# Patient Record
Sex: Female | Born: 1945 | Race: White | Hispanic: No | Smoking: Never smoker
Health system: Southern US, Community
[De-identification: ages and names within clinical notes are randomized; demographics above are authoritative.]

## PROBLEM LIST (undated history)

## (undated) DIAGNOSIS — T7840XA Allergy, unspecified, initial encounter: Secondary | ICD-10-CM

## (undated) DIAGNOSIS — K5792 Diverticulitis of intestine, part unspecified, without perforation or abscess without bleeding: Secondary | ICD-10-CM

## (undated) DIAGNOSIS — K760 Fatty (change of) liver, not elsewhere classified: Secondary | ICD-10-CM

## (undated) DIAGNOSIS — I639 Cerebral infarction, unspecified: Secondary | ICD-10-CM

## (undated) DIAGNOSIS — Q676 Pectus excavatum: Secondary | ICD-10-CM

## (undated) DIAGNOSIS — B029 Zoster without complications: Secondary | ICD-10-CM

## (undated) DIAGNOSIS — I2510 Atherosclerotic heart disease of native coronary artery without angina pectoris: Secondary | ICD-10-CM

## (undated) DIAGNOSIS — I10 Essential (primary) hypertension: Secondary | ICD-10-CM

## (undated) DIAGNOSIS — R911 Solitary pulmonary nodule: Secondary | ICD-10-CM

## (undated) DIAGNOSIS — K219 Gastro-esophageal reflux disease without esophagitis: Secondary | ICD-10-CM

## (undated) DIAGNOSIS — H269 Unspecified cataract: Secondary | ICD-10-CM

## (undated) DIAGNOSIS — K449 Diaphragmatic hernia without obstruction or gangrene: Secondary | ICD-10-CM

## (undated) DIAGNOSIS — I441 Atrioventricular block, second degree: Secondary | ICD-10-CM

## (undated) HISTORY — DX: Zoster without complications: B02.9

## (undated) HISTORY — DX: Allergy, unspecified, initial encounter: T78.40XA

## (undated) HISTORY — DX: Gastro-esophageal reflux disease without esophagitis: K21.9

## (undated) HISTORY — PX: BACK SURGERY: SHX140

## (undated) HISTORY — PX: LUMBAR LAMINECTOMY: SHX95

## (undated) HISTORY — DX: Essential (primary) hypertension: I10

## (undated) HISTORY — DX: Unspecified cataract: H26.9

## (undated) HISTORY — PX: LAPAROSCOPIC BILATERAL SALPINGO OOPHERECTOMY: SHX5890

## (undated) HISTORY — PX: ABDOMINAL HYSTERECTOMY: SHX81

## (undated) HISTORY — PX: CATARACT EXTRACTION, BILATERAL: SHX1313

---

## 1984-02-29 HISTORY — PX: ABDOMINAL HYSTERECTOMY: SHX81

## 1984-02-29 HISTORY — PX: OTHER SURGICAL HISTORY: SHX169

## 1999-12-06 ENCOUNTER — Other Ambulatory Visit: Admission: RE | Admit: 1999-12-06 | Discharge: 1999-12-06 | Payer: Self-pay | Admitting: Internal Medicine

## 1999-12-06 ENCOUNTER — Encounter: Payer: Self-pay | Admitting: Internal Medicine

## 1999-12-06 ENCOUNTER — Encounter: Admission: RE | Admit: 1999-12-06 | Discharge: 1999-12-06 | Payer: Self-pay | Admitting: Internal Medicine

## 1999-12-14 ENCOUNTER — Encounter: Admission: RE | Admit: 1999-12-14 | Discharge: 1999-12-14 | Payer: Self-pay | Admitting: Internal Medicine

## 1999-12-14 ENCOUNTER — Encounter: Payer: Self-pay | Admitting: Internal Medicine

## 2003-09-19 ENCOUNTER — Ambulatory Visit (HOSPITAL_COMMUNITY): Admission: RE | Admit: 2003-09-19 | Discharge: 2003-09-19 | Payer: Self-pay | Admitting: Gastroenterology

## 2003-09-19 ENCOUNTER — Encounter (INDEPENDENT_AMBULATORY_CARE_PROVIDER_SITE_OTHER): Payer: Self-pay | Admitting: Specialist

## 2008-04-07 ENCOUNTER — Encounter: Admission: RE | Admit: 2008-04-07 | Discharge: 2008-04-07 | Payer: Self-pay | Admitting: Family Medicine

## 2008-05-14 ENCOUNTER — Encounter: Admission: RE | Admit: 2008-05-14 | Discharge: 2008-05-14 | Payer: Self-pay | Admitting: Gastroenterology

## 2009-04-02 ENCOUNTER — Emergency Department (HOSPITAL_COMMUNITY): Admission: EM | Admit: 2009-04-02 | Discharge: 2009-04-02 | Payer: Self-pay | Admitting: Emergency Medicine

## 2011-03-18 NOTE — Op Note (Signed)
NAME:  Kayla Terry, Kayla Terry                        ACCOUNT NO.:  [id number]   MEDICAL RECORD NO.:  [id number]                   PATIENT TYPE:  AMB   LOCATION:  ENDO                                 FACILITY:  [facility]   PHYSICIAN:  Petra Kuba, M.D.                 DATE OF BIRTH:  [birth year]   DATE OF PROCEDURE:  09/19/2003  DATE OF DISCHARGE:                                 OPERATIVE REPORT   PROCEDURE:  Colonoscopy with biopsy.   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 Consent was signed  after the risks and benefits, methods and  options were thoroughly discussed in the office.   MEDICATIONS:  Demerol 75, Versed 6.   DESCRIPTION OF PROCEDURE:  A rectal inspection was pertinent for external  hemorrhoids. The digital examination was negative.   The video pediatric adjustable colonoscope was inserted and easily advanced  around the colon to the cecum. This did not require any abdominal  pressure  or any position changes.   We were able to advance a short way into the terminal ileum but could not  maintain position there unfortunately long enough  to get some biopsies. We  kept falling out back into the cecum. We did try rolling her on her back and  various abdominal  pressures to maintain position, but none was helpful, so  we elected to withdraw.   The prep was adequate. There was some liquid stool that required washing and  suctioning. The colon was normal on slow withdrawal back to the rectum.  Random colon biopsies were obtained. There was a rare early left-sided  diverticula but no other  abnormalities. Anorectal pullthrough and  retroflexion confirmed some small  hemorrhoids. The scope was reinserted a  short way up the left side of the colon. Air was suctioned and the scope was  removed.   The patient tolerated the procedure well. There were no obvious immediate  complications.   ENDOSCOPIC DIAGNOSES:  1. Internal and external  hemorrhoids.  2. Rare left-sided diverticula.  3. Otherwise within normal limits to the cecum, status post  random biopsy     surrounding and a quick look at the terminal ileum which was normal.   PLAN:  Await pathology to rule out microscopic colitis. Rescreen in 5 to 10  years. Will go ahead and have her Robinul, but if that continues to have  side-effects, may try Levsin or Nulev and follow up in 2 to 3 months or  p.r.n.                                               Petra Kuba, M.D.    MEM/MEDQ  D:  09/19/2003  T:  09/19/2003  Job:  [id number]  cc:   Eric L. August Saucer, M.D.  [address] [address]  [place]  [place] [address]  Fax: [phone]

## 2011-07-21 ENCOUNTER — Other Ambulatory Visit: Payer: Self-pay | Admitting: Family Medicine

## 2011-07-21 DIAGNOSIS — Z1231 Encounter for screening mammogram for malignant neoplasm of breast: Secondary | ICD-10-CM

## 2011-07-27 ENCOUNTER — Ambulatory Visit
Admission: RE | Admit: 2011-07-27 | Discharge: 2011-07-27 | Disposition: A | Discharge status: Home / Self Care | Payer: MEDICARE | Source: Ambulatory Visit | Attending: Family Medicine | Admitting: Family Medicine

## 2011-07-27 DIAGNOSIS — Z1231 Encounter for screening mammogram for malignant neoplasm of breast: Secondary | ICD-10-CM

## 2011-11-01 DIAGNOSIS — I340 Nonrheumatic mitral (valve) insufficiency: Secondary | ICD-10-CM

## 2011-11-01 HISTORY — DX: Nonrheumatic mitral (valve) insufficiency: I34.0

## 2011-12-24 ENCOUNTER — Ambulatory Visit (INDEPENDENT_AMBULATORY_CARE_PROVIDER_SITE_OTHER): Payer: Medicare Other | Admitting: Family Medicine

## 2011-12-24 VITALS — BP 167/99 | HR 89 | Temp 97.9°F | Resp 16 | Ht 64.25 in | Wt 159.0 lb

## 2011-12-24 DIAGNOSIS — I10 Essential (primary) hypertension: Secondary | ICD-10-CM

## 2011-12-24 DIAGNOSIS — J310 Chronic rhinitis: Secondary | ICD-10-CM

## 2011-12-24 DIAGNOSIS — J029 Acute pharyngitis, unspecified: Secondary | ICD-10-CM

## 2011-12-24 LAB — POCT RAPID STREP A (OFFICE): Rapid Strep A Screen: NEGATIVE

## 2011-12-24 MED ORDER — HYDROCOD POLST-CHLORPHEN POLST 10-8 MG/5ML PO LQCR: 5.0000 mL | Freq: Two times a day (BID) | ORAL | Status: AC | PRN

## 2011-12-24 NOTE — Progress Notes (Signed)
  [facility]:  Office Visit  Chief Complaint:  Chief Complaint  Patient presents with  . Sore Throat    x 1 week allegra, corcidin  not helping  . Headache    x 1 week    HPI: Kayla Terry is a 66 y.o. female who complains of  Sore throat, phlegm in mouth, HA, chills, + sinus pressure all over, Denies cough or ear pain.  Tried Excedrin, Allegra, Clorascetin without relief.  + Sick contacts. + grandkids   Past Medical History  Diagnosis Date  . Allergy    History reviewed. No pertinent past surgical history. History   Social History  . Marital Status: Married    Spouse Name: N/A    Number of Children: N/A  . Years of Education: N/A   Social History Main Topics  . Smoking status: Never Smoker   . Smokeless tobacco: None  . Alcohol Use: No  . Drug Use: No  . Sexually Active: None   Other Topics Concern  . None   Social History Narrative  . None   Family History  Problem Relation Age of Onset  . Cancer Mother   . Cancer Father   . Multiple sclerosis Sister   . Alcohol abuse Brother    Allergies  Allergen Reactions  . Aleve Anxiety   Prior to Admission medications   Not on File     ROS: The patient denies fevers, chills, night sweats, unintentional weight loss, chest pain, palpitations, wheezing, dyspnea on exertion, nausea, vomiting, abdominal pain, dysuria, hematuria, melena, numbness, weakness, or tingling. + sore throat  All other systems have been reviewed and were otherwise negative with the exception of those mentioned in the HPI and as above.    PHYSICAL EXAM: Filed Vitals:   12/24/11 1426  BP: 167/99  Pulse: 89  Temp: 97.9 F (36.6 C)  Resp: 16   Filed Vitals:   12/24/11 1426  Height: 5' 4.25" (1.632 m)  Weight: 159 lb (72.122 kg)   Body mass index is 27.08 kg/(m^2).  General: Alert, no acute distress HEENT:  Normocephalic, atraumatic, oropharynx patent. + boogy nares, red nares. No exudates, tonsils slightly  erythematous. TM normal. NO sinus tenderness.  Cardiovascular:  Regular rate and rhythm, no rubs murmurs or gallops.  No Carotid bruits, radial pulse intact. No pedal edema.  Respiratory: Clear to auscultation bilaterally.  No wheezes, rales, or rhonchi.  No cyanosis, no use of accessory musculature GI: No organomegaly, abdomen is soft and non-tender, positive bowel sounds.  No masses. Skin: No rashes. Neurologic: Facial musculature symmetric. Psychiatric: Patient is appropriate throughout our interaction. Lymphatic: No cervical lymphadenopathy Musculoskeletal: Gait intact.   LABS: Results for orders placed in visit on 12/24/11  POCT RAPID STREP A (OFFICE)      Component Value Range   Rapid Strep A Screen Negative  Negative       ASSESSMENT/PLAN: Encounter Diagnoses  Name Primary?  . Pharyngitis Yes  . Rhinitis     1. Will try a trial of flonase and Tussionex for sxs treatment. I think this is just a viral URI with PND rhinitis. If no improvement in 48-72 hours then consider abx. She has been on amoxacillin and Z-pack before which helps.  F/u prn 2. HTN- contributing to HA, taking decongestants? Advise to monitor and dc decongestants.    Hamilton Capri PHUONG, DO 12/24/2011 3:33 PM

## 2011-12-26 ENCOUNTER — Telehealth: Payer: Self-pay | Admitting: Family Medicine

## 2011-12-26 MED ORDER — AZITHROMYCIN 250 MG PO TABS: ORAL_TABLET | ORAL | Status: AC

## 2011-12-26 MED ORDER — BENZONATATE 200 MG PO CAPS: 200.0000 mg | ORAL_CAPSULE | Freq: Two times a day (BID) | ORAL | Status: AC | PRN

## 2011-12-26 NOTE — Telephone Encounter (Signed)
Spoke with patient, she is not feeling better. She was unable to fill her Tussionex prescription because it was too expensive. Sx are the same. Will rx Z-pack and Tessalon Perles . Asked patient to be mindful of blood pressure while on decongestant, stop decongestant since BP is too high.

## 2012-01-29 ENCOUNTER — Emergency Department (HOSPITAL_COMMUNITY)
Admission: EM | Admit: 2012-01-29 | Discharge: 2012-01-29 | Disposition: A | Discharge status: Home / Self Care | Payer: Medicare Other | Attending: Emergency Medicine | Admitting: Emergency Medicine

## 2012-01-29 ENCOUNTER — Other Ambulatory Visit: Payer: Self-pay

## 2012-01-29 ENCOUNTER — Encounter (HOSPITAL_COMMUNITY): Payer: Self-pay

## 2012-01-29 ENCOUNTER — Emergency Department (HOSPITAL_COMMUNITY): Payer: Medicare Other

## 2012-01-29 DIAGNOSIS — R1012 Left upper quadrant pain: Secondary | ICD-10-CM | POA: Insufficient documentation

## 2012-01-29 DIAGNOSIS — R071 Chest pain on breathing: Secondary | ICD-10-CM | POA: Insufficient documentation

## 2012-01-29 DIAGNOSIS — R0789 Other chest pain: Secondary | ICD-10-CM

## 2012-01-29 LAB — COMPREHENSIVE METABOLIC PANEL
AST: 21 U/L (ref 0–37)
BUN: 11 mg/dL (ref 6–23)
CO2: 26 mEq/L (ref 19–32)
Chloride: 105 mEq/L (ref 96–112)
Creatinine, Ser: 0.73 mg/dL (ref 0.50–1.10)
GFR calc non Af Amer: 88 mL/min — ABNORMAL LOW (ref >90)
Total Bilirubin: 0.3 mg/dL (ref 0.3–1.2)

## 2012-01-29 LAB — DIFFERENTIAL
Basophils Absolute: 0 10*3/uL (ref 0.0–0.1)
Lymphocytes Relative: 26 % (ref 12–46)
Monocytes Absolute: 0.5 10*3/uL (ref 0.1–1.0)
Monocytes Relative: 13 % — ABNORMAL HIGH (ref 3–12)
Neutro Abs: 2.4 10*3/uL (ref 1.7–7.7)

## 2012-01-29 LAB — URINALYSIS, ROUTINE W REFLEX MICROSCOPIC
Bilirubin Urine: NEGATIVE
Glucose, UA: NEGATIVE mg/dL
Ketones, ur: NEGATIVE mg/dL
Protein, ur: NEGATIVE mg/dL

## 2012-01-29 LAB — URINE MICROSCOPIC-ADD ON

## 2012-01-29 LAB — CBC
HCT: 37.4 % (ref 36.0–46.0)
Hemoglobin: 12.8 g/dL (ref 12.0–15.0)
RBC: 4.38 MIL/uL (ref 3.87–5.11)
WBC: 4.2 10*3/uL (ref 4.0–10.5)

## 2012-01-29 MED ORDER — HYDROMORPHONE HCL PF 1 MG/ML IJ SOLN
1.0000 mg | Freq: Once | INTRAMUSCULAR | Status: AC
  Administered 2012-01-29: 1 mg via INTRAVENOUS
  Filled 2012-01-29: qty 1

## 2012-01-29 MED ORDER — OMEPRAZOLE 20 MG PO CPDR: 20.0000 mg | DELAYED_RELEASE_CAPSULE | Freq: Every day | ORAL | Status: DC

## 2012-01-29 MED ORDER — ONDANSETRON HCL 4 MG/2ML IJ SOLN
4.0000 mg | Freq: Once | INTRAMUSCULAR | Status: AC
  Administered 2012-01-29: 4 mg via INTRAVENOUS
  Filled 2012-01-29: qty 2

## 2012-01-29 MED ORDER — HYDROCODONE-ACETAMINOPHEN 5-500 MG PO TABS: 1.0000 | ORAL_TABLET | Freq: Four times a day (QID) | ORAL | Status: DC | PRN

## 2012-01-29 MED ORDER — HYDROCODONE-ACETAMINOPHEN 5-500 MG PO TABS: 1.0000 | ORAL_TABLET | Freq: Four times a day (QID) | ORAL | Status: AC | PRN

## 2012-01-29 MED ORDER — IBUPROFEN 800 MG PO TABS: 800.0000 mg | ORAL_TABLET | Freq: Three times a day (TID) | ORAL | Status: DC

## 2012-01-29 MED ORDER — ASPIRIN 81 MG PO CHEW
324.0000 mg | CHEWABLE_TABLET | Freq: Once | ORAL | Status: AC
  Administered 2012-01-29: 324 mg via ORAL
  Filled 2012-01-29: qty 4

## 2012-01-29 MED ORDER — SODIUM CHLORIDE 0.9 % IV BOLUS (SEPSIS)
500.0000 mL | Freq: Once | INTRAVENOUS | Status: AC
  Administered 2012-01-29: 500 mL via INTRAVENOUS

## 2012-01-29 MED ORDER — GI COCKTAIL ~~LOC~~
30.0000 mL | Freq: Once | ORAL | Status: AC
  Administered 2012-01-29: 30 mL via ORAL
  Filled 2012-01-29: qty 30

## 2012-01-29 MED ORDER — IBUPROFEN 800 MG PO TABS: 800.0000 mg | ORAL_TABLET | Freq: Three times a day (TID) | ORAL | Status: AC

## 2012-01-29 NOTE — ED Provider Notes (Signed)
9:45 AM  Date: 01/29/2012  Rate: 78  Rhythm: normal sinus rhythm  QRS Axis: normal  Intervals: normal  ST/T Wave abnormalities: normal  Conduction Disutrbances:none  Narrative Interpretation: Normal EKG.  Old EKG Reviewed: none available    Carleene Cooper III, MD 01/29/12 [phone]

## 2012-01-29 NOTE — ED Notes (Signed)
Lt. Upper quadrant abdominal pain began yesterday, she describes the pain as sharp.   Pain increases when she lays on her rt. Side or walking.  Pt. Denies any n/v/d, sob on exertions weakness and urinary pressure and feels like she has to urinate but cannot.   Denies any chest pain

## 2012-01-29 NOTE — Discharge Instructions (Signed)
Take ibuprofen for pain and inflammation with hydrocodone-acetaminophen for breakthrough pain but do not drive or operate machinery with hydrocodone-acetaminophen use. You may need an over-the-counter stool softener with hydrocodone-acetaminophen use. Also start Prilosec, as directed, for the next week. Followup with your primary care physician in the next 3-5 days for recheck of ongoing symptoms however return to emergency department for any changing or worsening symptoms as discussed.  Chest Wall Pain Chest wall pain is pain in or around the bones and muscles of your chest. It may take up to 6 weeks to get better. It may take longer if you must stay physically active in your work and activities.  CAUSES  Chest wall pain may happen on its own. However, it may be caused by:  A viral illness like the flu.   Injury.   Coughing.   Exercise.   Arthritis.   Fibromyalgia.   Shingles.  HOME CARE INSTRUCTIONS   Avoid overtiring physical activity. Try not to strain or perform activities that cause pain. This includes any activities using your chest or your abdominal and side muscles, especially if heavy weights are used.   Put ice on the sore area.   Put ice in a plastic bag.   Place a towel between your skin and the bag.   Leave the ice on for 15 to 20 minutes per hour while awake for the first 2 days.   Only take over-the-counter or prescription medicines for pain, discomfort, or fever as directed by your caregiver.  SEEK IMMEDIATE MEDICAL CARE IF:   Your pain increases, or you are very uncomfortable.   You have a fever.   Your chest pain becomes worse.   You have new, unexplained symptoms.   You have nausea or vomiting.   You feel sweaty or lightheaded.   You have a cough with phlegm (sputum), or you cough up blood.  MAKE SURE YOU:   Understand these instructions.   Will watch your condition.   Will get help right away if you are not doing well or get worse.  Document  Released: 10/17/2005 Document Revised: 10/06/2011 Document Reviewed: 06/13/2011 [facility] Patient Information 2012 [place], [place].

## 2012-01-29 NOTE — ED Provider Notes (Signed)
History     CSN: [id number]  Arrival date & time 01/29/12  4098   First MD Initiated Contact with Patient 01/29/12 0813      Chief Complaint  Patient presents with  . Abdominal Pain    (Consider location/radiation/quality/duration/timing/severity/associated sxs/prior treatment) HPI Patient presents to emergency department complaining of left upper quadrant/left lower anterior chest pain. Patient states that yesterday afternoon she began to have a mild "constant pinching sensation" with intermittent mild stabbing pain that she states was very fleeting in her left upper quadrant/left lower anterior chest "under my ribs." However patient states that by approximately 9 PM the pain has become much more severe and constant described as a constant stabbing ache. Patient states pain is aggravated by lying on her right side stating "if I try to lie down on my right side it feels like there is a knife stabbing into my left side." Patient also states that if she gets up to move around she feels like the constant pain become worse however states "I can't move any certain direction or push on anything to make the pain, it just feels like if I begin to move, it hurts more." Patient states that when the pain becomes more severe it "makes me catch my breath." She denies any chest pain or shortness of breath. She denies nausea, vomiting, diarrhea. Patient denies any recent fevers, chills, or illness. Patient has no known medical problems and takes no medicines on regular basis. She denies tobacco, alcohol, or illicit drug use. She denies family history of early heart disease or heart attack. Patient took two Tylenol PM's last night. She takes no medicines on a daily basis. She took nothing for pain this morning prior to arrival. Patient is followed by [facility] family practice. Patient denies any recent long travel, lower extremity pain or swelling, history of bleeding or clotting disorder, cardiac or pulmonary  disease, history of PE or DVT, cough or hemoptysis. She denies any exogenous estrogen use. Patient denies any history of similar pain. She denies dysuria, hematuria, abdominal pain, or blood in her stool. Patient states she has history of a partial hysterectomy with a left oophorectomy. She denies any additional abdominal surgeries. She denies alleviating factors.   Past Medical History  Diagnosis Date  . Allergy     History reviewed. No pertinent past surgical history.  Family History  Problem Relation Age of Onset  . Cancer Mother   . Cancer Father   . Multiple sclerosis Sister   . Alcohol abuse Brother     History  Substance Use Topics  . Smoking status: Never Smoker   . Smokeless tobacco: Not on file  . Alcohol Use: No    OB History    Grav Para Term Preterm Abortions TAB SAB Ect Mult Living                  Review of Systems  All other systems reviewed and are negative.    Allergies  Aleve  Home Medications   Current Outpatient Rx  Name Route Sig Dispense Refill  . OMEPRAZOLE-SODIUM BICARBONATE 20-1100 MG PO CAPS Oral Take 1 capsule by mouth daily as needed. For heartburn      BP 161/102  Temp(Src) 97.7 F (36.5 C) (Oral)  SpO2 96%  Physical Exam  Nursing note and vitals reviewed. Constitutional: She is oriented to person, place, and time. She appears well-developed and well-nourished. No distress.  HENT:  Head: Normocephalic and atraumatic.  Eyes: Conjunctivae are normal.  Neck: Normal range of motion. Neck supple.  Cardiovascular: Normal rate, regular rhythm, normal heart sounds and intact distal pulses.  Exam reveals no gallop and no friction rub.   No murmur heard. Pulmonary/Chest: Effort normal and breath sounds normal. No respiratory distress. She has no wheezes. She has no rales. She exhibits no tenderness.  Abdominal: Soft. Bowel sounds are normal. She exhibits no distension and no mass. There is no tenderness. There is no rebound and no  guarding.       No CVA TTP.   Musculoskeletal: Normal range of motion. She exhibits no edema and no tenderness.  Neurological: She is alert and oriented to person, place, and time.  Skin: Skin is warm and dry. No rash noted. She is not diaphoretic. No erythema.  Psychiatric: She has a normal mood and affect.    ED Course  Procedures (including critical care time)  PO ASA and GI cocktail IV fluids, dilaudid and zofran  Labs Reviewed  DIFFERENTIAL - Abnormal; Notable for the following:    Monocytes Relative 13 (*)    All other components within normal limits  COMPREHENSIVE METABOLIC PANEL - Abnormal; Notable for the following:    GFR calc non Af Amer 88 (*)    All other components within normal limits  URINALYSIS, ROUTINE W REFLEX MICROSCOPIC - Abnormal; Notable for the following:    Leukocytes, UA TRACE (*)    All other components within normal limits  CBC  LIPASE, BLOOD  D-DIMER, QUANTITATIVE  POCT I-STAT TROPONIN I  URINE MICROSCOPIC-ADD ON   Dg Chest 2 View  01/29/2012  *RADIOLOGY REPORT*  Clinical Data: Left upper quadrant abdominal pain since early this morning.  CHEST - 2 VIEW  Comparison: Chest x-ray 02/16/2008.  Findings: The lung volumes are normal.  No consolidative airspace disease.  There is some mild bronchial wall thickening, most pronounced in the region of the right middle and right lower lobe. There are no pleural effusions.  Pulmonary vasculature is normal. Heart size appears mildly enlarged, although this is likely accentuated by the patient's pectus excavatum deformity. Mediastinal contours are unremarkable.  IMPRESSION: 1.  Focal bronchial wall thickening involving portions of the right middle and lower lobe.  This could represent a manifestation of bronchitis, sequelae of mild aspiration, or early developing bronchopneumonia.  Clinical correlation is recommended.  Original Report Authenticated By: Florencia Reasons, M.D.     1. Chest wall pain       MDM    Patient is completely pain-free at this time. Patient's evaluation and treatment plan discussed with Dr. Ignacia Palma. At this time we have no origin for patient's pain that is resolved. She is low risk for both acute coronary syndrome and pulmonary embolism with a negative d-dimer and negative troponin. No acute findings on EKG. Abdomen is soft and nontender and nonacute. Vital signs stable and patient is afebrile. We spoke at length with patient about worrisome signs or symptoms that should prompt return to emergency department otherwise conservative management at home with anti-inflammatories and pain medicine and PPI with close followup with her primary care physician. Patient voices her understanding and is agreeable to this plan.        Jenness Corner, [place] 01/29/12 1118

## 2012-01-30 NOTE — ED Provider Notes (Signed)
Medical screening examination/treatment/procedure(s) were performed by non-physician practitioner and as supervising physician I was immediately available for consultation/collaboration.   Carleene Cooper III, MD 01/30/12 1010

## 2012-05-16 ENCOUNTER — Ambulatory Visit (INDEPENDENT_AMBULATORY_CARE_PROVIDER_SITE_OTHER): Payer: Medicare Other | Admitting: Emergency Medicine

## 2012-05-16 VITALS — BP 120/80 | HR 88 | Temp 98.0°F | Resp 16 | Ht 63.0 in | Wt 150.4 lb

## 2012-05-16 DIAGNOSIS — M5412 Radiculopathy, cervical region: Secondary | ICD-10-CM

## 2012-05-16 MED ORDER — HYDROCODONE-ACETAMINOPHEN 5-500 MG PO TABS: 1.0000 | ORAL_TABLET | ORAL | Status: AC | PRN

## 2012-05-16 MED ORDER — CELECOXIB 200 MG PO CAPS: 200.0000 mg | ORAL_CAPSULE | Freq: Two times a day (BID) | ORAL | Status: AC

## 2012-05-16 NOTE — Progress Notes (Signed)
   Date:  05/16/2012   Name:  Kayla Terry   DOB:  30-Mar-1946   MRN:  [id number]  PCP:  No primary provider on file.    Chief Complaint: Shoulder Pain   History of Present Illness:  Kayla Terry is a 66 y.o. very pleasant female patient who presents with the following:  2 week duration of pain in both upper arms when she lays down to sleep.  No numbness or weakness.  Pain symetrical.  No neuro symptoms, overuse, or history of injury.  Denies other complaints.  There is no problem list on file for this patient.   Past Medical History  Diagnosis Date  . Allergy     No past surgical history on file.  History  Substance Use Topics  . Smoking status: Never Smoker   . Smokeless tobacco: Not on file  . Alcohol Use: No    Family History  Problem Relation Age of Onset  . Cancer Mother   . Cancer Father   . Multiple sclerosis Sister   . Alcohol abuse Brother     Allergies  Allergen Reactions  . Naproxen Sodium Anxiety    Medication list has been reviewed and updated.  Current Outpatient Prescriptions on File Prior to Visit  Medication Sig Dispense Refill  . omeprazole (PRILOSEC) 20 MG capsule Take 1 capsule (20 mg total) by mouth daily.  14 capsule  0  . Omeprazole-Sodium Bicarbonate (ZEGERID) 20-1100 MG CAPS Take 1 capsule by mouth daily as needed. For heartburn        Review of Systems:  As per HPI, otherwise negative.    Physical Examination: Filed Vitals:   05/16/12 1524  BP: 120/80  Pulse: 88  Temp: 98 F (36.7 C)  Resp: 16   Filed Vitals:   05/16/12 1524  Height: 5\' 3"  (1.6 m)  Weight: 150 lb 6.4 oz (68.221 kg)   Body mass index is 26.64 kg/(m^2). Ideal Body Weight: Weight in (lb) to have BMI = 25: 140.8    GEN: WDWN, NAD, Non-toxic, Alert & Oriented x 3 HEENT: Atraumatic, Normocephalic.  Ears and Nose: No external deformity. EXTR: No clubbing/cyanosis/edema.  No tenderness, weakness, neuro normal  tinnel and phalen negative. NEURO:  Normal gait.  PSYCH: Normally interactive. Conversant. Not depressed or anxious appearing.  Calm demeanor.    Assessment and Plan: Cervical neuritis. NSAID vicodin NCS/EMG Follow up after  Carmelina Dane, MD

## 2012-08-21 ENCOUNTER — Ambulatory Visit (INDEPENDENT_AMBULATORY_CARE_PROVIDER_SITE_OTHER): Payer: Medicare Other | Admitting: Cardiovascular Disease

## 2012-08-21 ENCOUNTER — Encounter: Payer: Self-pay | Admitting: Cardiovascular Disease

## 2012-08-21 VITALS — BP 118/86 | HR 71 | Ht 63.0 in | Wt 151.4 lb

## 2012-08-21 DIAGNOSIS — R0602 Shortness of breath: Secondary | ICD-10-CM

## 2012-08-21 DIAGNOSIS — R0789 Other chest pain: Secondary | ICD-10-CM | POA: Insufficient documentation

## 2012-08-21 NOTE — Patient Instructions (Addendum)
Your physician has requested that you have an echocardiogram. Echocardiography is a painless test that uses sound waves to create images of your heart. It provides your doctor with information about the size and shape of your heart and how well your heart's chambers and valves are working. This procedure takes approximately one hour. There are no restrictions for this procedure.  Your physician recommends that you schedule a follow-up appointment in: 3 MONTHS 

## 2012-08-21 NOTE — Progress Notes (Signed)
    Kayla Terry Date of Birth  June 30, 1946       [facility]    [place] [address]  [address] [place], [place]  [address]   [place], [place]  [address] [phone]     [phone]   Fax  [phone]    Fax [phone]  Problem List: Chest tightness  History of Present Illness:  Kayla Terry is a 66 yo who presents for further evaluation of chest tightness and shoulder pains.  The pain is described as an ache.  These pains can last as long as several days.  She was found to have shingles at one point.  These pains have now progressed and now involve both shoulders.   The pain progressed and would occur every day and would hurt whenever she would lie down.  The pains get slightly better if she sits up.  There is no pleuretic component.    Kayla Terry is very active - she walks regularly.  She does not have any chest pain but does have some dyspnea with exertion.  Since she has been having the chest pains, she has been walking with a walking stick for balance.  She denies any syncope.  I saw Kayla Terry in the past for a similar chest pain - we performed a stress test that was negative.    Current Outpatient Prescriptions on File Prior to Visit  Medication Sig Dispense Refill  . Omeprazole-Sodium Bicarbonate (ZEGERID) 20-1100 MG CAPS Take 1 capsule by mouth daily as needed. For heartburn        Allergies  Allergen Reactions  . Naproxen Sodium Anxiety    Past Medical History  Diagnosis Date  . Allergy     No past surgical history on file.  History  Smoking status  . Never Smoker   Smokeless tobacco  . Not on file    History  Alcohol Use No    Family History  Problem Relation Age of Onset  . Cancer Mother   . Cancer Father   . Multiple sclerosis Sister   . Alcohol abuse Brother     Reviw of Systems:  Reviewed in the HPI.  All other systems are negative.  Physical Exam: Blood pressure 118/86, pulse 71, height 5\' 3"  (1.6  m), weight 151 lb 6.4 oz (68.675 kg). General: Well developed, well nourished, in no acute distress.  Head: Normocephalic, atraumatic, sclera non-icteric, mucus membranes are moist,   Neck: Supple. Carotids are 2 + without bruits. No JVD  Lungs: Clear bilaterally to auscultation.  Heart: regular rate.  normal  S1 S2. No murmurs, gallops or rubs.  Abdomen: Soft, non-tender, non-distended with normal bowel sounds. No hepatomegaly. No rebound/guarding. No masses.  Msk:  Strength and tone are normal  Extremities: No clubbing or cyanosis. No edema.  Distal pedal pulses are 2+ and equal bilaterally.  Neuro: Alert and oriented X 3. Moves all extremities spontaneously.  Psych:  Responds to questions appropriately with a normal affect.  ECG: Oct. 22, 2013 - NSR at 30, Early repol changes  Assessment / Plan:

## 2012-08-21 NOTE — Assessment & Plan Note (Signed)
Kayla Terry is a 66 yo who presents with episodes of chest pain. Her episodes are fairly atypical. They last for many days at a time.  He does have some shortness of breath with exertion. She apparently has seen me in the past for stress test although we could not find any records of that.  We'll schedule her for an echocardiogram for further evaluation of her chest discomfort and for her shortness of breath with exertion. Who  I have encouraged her to start a regular walking program. I'll see her back in 3 months for followup visit. After that she will followup with her general medical Dr.

## 2012-08-27 ENCOUNTER — Other Ambulatory Visit: Payer: Self-pay | Admitting: Family Medicine

## 2012-08-27 DIAGNOSIS — Z1231 Encounter for screening mammogram for malignant neoplasm of breast: Secondary | ICD-10-CM

## 2012-09-04 ENCOUNTER — Other Ambulatory Visit (HOSPITAL_COMMUNITY): Payer: Medicare Other

## 2012-09-04 ENCOUNTER — Telehealth: Payer: Self-pay | Admitting: *Deleted

## 2012-09-04 NOTE — Telephone Encounter (Signed)
Pt did not have echo done, reminded her that test order still available to be rescheduled, she states she will if Dr Elease Hashimoto still feels it is necessary.  Pt continues to have CP- mid chest, SOB, soreness/ pressure is more constant now, describes occasional pains that shoot up neck bilaterally only lasting seconds, states she has both arms ache from shoulder to elbow that hydrocodone will help ease, states that exercise does not make pain worse, 2009 stress test was normal per pt, Denies nausea and no diaphoresis. Pt has app tomorrow with PCP to discuss going to Neurologist per your suggestion.  Any further advise?

## 2012-09-05 ENCOUNTER — Telehealth: Payer: Self-pay | Admitting: Cardiovascular Disease

## 2012-09-05 NOTE — Telephone Encounter (Signed)
According to pt her PCP's office called today to cancelled her appointment indefinitely due to PCP  medical emergency. Pt's PCP wanted to see pt prior referring her to a neurologist. Pt states on  her last office visit with Dr. Elease Hashimoto, on 08/21/12 MD recommended for pt call her PCP so he can  referred her to a Neurologist, because pt's  symptoms were not heart related. Patient states that  The PCP's office said that   if Dr. Elease Hashimoto documents that pt needs to be ed to a neurologist . The PCP's office will use that information and will referrer pt.

## 2012-09-05 NOTE — Telephone Encounter (Signed)
If Dr. Elease Hashimoto document that pt needs to be referred to a neurologist. The PCP's office will use Dr. Elease Hashimoto documentation and will referrer pt to a Neurologist. ( Please see note bellow)

## 2012-09-05 NOTE — Telephone Encounter (Signed)
Pt's pcp office called her last night to cancel appt indefinitely  due to a medical emergency, dr Elease Hashimoto suggested a neurologist and to call her pcp to get a referral,  pcp needed to see before doing a referral, could dr Elease Hashimoto refer her since she doen't know when she will see pcp again,  would like dr love if still in practice, pt [phone]

## 2012-09-06 NOTE — Telephone Encounter (Signed)
Spoke with Dr Elease Hashimoto, he prefers that the pt see a PA/ NP from her PCP office, he was speaking out loud when it came to her discussing her hx with shingles/ not active currently. MSG left for pt and told her to call with further questions/ number provided.

## 2012-09-07 ENCOUNTER — Telehealth: Payer: Self-pay | Admitting: *Deleted

## 2012-09-07 NOTE — Telephone Encounter (Signed)
I called pt, she will call back today and schedule echo and will call PCP to set app with Np/pa. Pt agreed to plan.

## 2012-09-07 NOTE — Telephone Encounter (Signed)
Message     I would recommend that she have the echo since she has continued to have the chest pain.         ----- Message -----   From: Antony Odea, RN   Sent: 09/04/2012 9:30 AM   To: Vesta Mixer, MD, Antony Odea, RN

## 2012-09-12 ENCOUNTER — Ambulatory Visit (HOSPITAL_COMMUNITY): Payer: Medicare Other | Attending: Cardiovascular Disease | Admitting: Radiology

## 2012-09-12 DIAGNOSIS — R072 Precordial pain: Secondary | ICD-10-CM

## 2012-09-12 DIAGNOSIS — R0789 Other chest pain: Secondary | ICD-10-CM

## 2012-09-12 DIAGNOSIS — R0609 Other forms of dyspnea: Secondary | ICD-10-CM | POA: Insufficient documentation

## 2012-09-12 DIAGNOSIS — R0989 Other specified symptoms and signs involving the circulatory and respiratory systems: Secondary | ICD-10-CM | POA: Insufficient documentation

## 2012-09-12 DIAGNOSIS — R0602 Shortness of breath: Secondary | ICD-10-CM

## 2012-09-12 DIAGNOSIS — I059 Rheumatic mitral valve disease, unspecified: Secondary | ICD-10-CM | POA: Insufficient documentation

## 2012-09-12 NOTE — Progress Notes (Signed)
Echocardiogram performed.  

## 2012-10-05 ENCOUNTER — Ambulatory Visit
Admission: RE | Admit: 2012-10-05 | Discharge: 2012-10-05 | Disposition: A | Discharge status: Home / Self Care | Payer: Medicare Other | Source: Ambulatory Visit | Attending: Family Medicine | Admitting: Family Medicine

## 2012-10-05 DIAGNOSIS — Z1231 Encounter for screening mammogram for malignant neoplasm of breast: Secondary | ICD-10-CM

## 2012-10-26 ENCOUNTER — Encounter: Payer: Self-pay | Admitting: Cardiovascular Disease

## 2012-11-01 ENCOUNTER — Other Ambulatory Visit: Payer: Self-pay | Admitting: Gastroenterology

## 2012-11-01 DIAGNOSIS — R109 Unspecified abdominal pain: Secondary | ICD-10-CM

## 2012-11-01 DIAGNOSIS — R110 Nausea: Secondary | ICD-10-CM

## 2012-11-01 DIAGNOSIS — R079 Chest pain, unspecified: Secondary | ICD-10-CM

## 2012-11-06 ENCOUNTER — Ambulatory Visit
Admission: RE | Admit: 2012-11-06 | Discharge: 2012-11-06 | Disposition: A | Discharge status: Home / Self Care | Payer: Medicare Other | Source: Ambulatory Visit | Attending: Gastroenterology | Admitting: Gastroenterology

## 2012-11-06 DIAGNOSIS — R079 Chest pain, unspecified: Secondary | ICD-10-CM

## 2012-11-06 DIAGNOSIS — R110 Nausea: Secondary | ICD-10-CM

## 2012-11-06 DIAGNOSIS — R109 Unspecified abdominal pain: Secondary | ICD-10-CM

## 2012-11-12 ENCOUNTER — Ambulatory Visit: Payer: Medicare Other | Admitting: Cardiovascular Disease

## 2013-10-07 ENCOUNTER — Ambulatory Visit (INDEPENDENT_AMBULATORY_CARE_PROVIDER_SITE_OTHER): Payer: Medicare Other | Admitting: Family Medicine

## 2013-10-07 VITALS — BP 118/78 | HR 76 | Temp 97.4°F | Resp 16 | Ht 63.5 in | Wt 146.0 lb

## 2013-10-07 DIAGNOSIS — R110 Nausea: Secondary | ICD-10-CM

## 2013-10-07 DIAGNOSIS — R079 Chest pain, unspecified: Secondary | ICD-10-CM

## 2013-10-07 DIAGNOSIS — K219 Gastro-esophageal reflux disease without esophagitis: Secondary | ICD-10-CM

## 2013-10-07 DIAGNOSIS — J029 Acute pharyngitis, unspecified: Secondary | ICD-10-CM

## 2013-10-07 LAB — POCT CBC
Granulocyte percent: 78.9 %G (ref 37–80)
HCT, POC: 45.5 % (ref 37.7–47.9)
Lymph, poc: 1.4 (ref 0.6–3.4)
MCH, POC: 28.9 pg (ref 27–31.2)
MCV: 95.6 fL (ref 80–97)
MID (cbc): 0.5 (ref 0–0.9)
POC LYMPH PERCENT: 15.4 %L (ref 10–50)
Platelet Count, POC: 294 10*3/uL (ref 142–424)
RDW, POC: 14.1 %
WBC: 9.2 10*3/uL (ref 4.6–10.2)

## 2013-10-07 LAB — COMPREHENSIVE METABOLIC PANEL
Albumin: 4.4 g/dL (ref 3.5–5.2)
BUN: 11 mg/dL (ref 6–23)
CO2: 31 mEq/L (ref 19–32)
Glucose, Bld: 101 mg/dL — ABNORMAL HIGH (ref 70–99)
Sodium: 139 mEq/L (ref 135–145)
Total Bilirubin: 0.5 mg/dL (ref 0.3–1.2)
Total Protein: 7.2 g/dL (ref 6.0–8.3)

## 2013-10-07 MED ORDER — GI COCKTAIL ~~LOC~~: 30.0000 mL | Freq: Once | ORAL | Status: DC

## 2013-10-07 MED ORDER — SUCRALFATE 1 G PO TABS: 1.0000 g | ORAL_TABLET | Freq: Three times a day (TID) | ORAL | Status: DC

## 2013-10-07 MED ORDER — ONDANSETRON HCL 8 MG PO TABS: ORAL_TABLET | ORAL | Status: DC

## 2013-10-07 NOTE — Patient Instructions (Signed)
Use the zofran as needed for nausea. Try taking a carafate tablet before meals and before bed for about one week.  This may be helpful with nausea and reflux into your throat Work on a bland diet that is easy to digest and low in acid (avoid coffee, carbonated drinks, tomatoes, peppermint).   Call Dr. Alveta Heimlich office and explain your situation. Ask if they have any more dexilant samples that you could have  If you have any change in your symptoms, are getting worse or have any other concerns please let us know

## 2013-10-07 NOTE — Progress Notes (Signed)
[facility] [address], [place] [address] [phone]  Date:  10/07/2013   Name:  Kayla Terry   DOB:  Aug 28, 1946   MRN:  [id number]  PCP:  Pamelia Hoit, MD    Chief Complaint: Sore Throat, Headache and Nausea   History of Present Illness:  Kayla Terry is a 67 y.o. very pleasant female patient who presents with the following:  Here today because "I just don't feel good."  She notes that she has felt nauseated for about 2.5 weeks. Last week her throat became "scratchy".  She felt discomfort in the roof of her mouth.  She tried taking some allegra D which did not help.   Since yesterday she notes that her throat is more sore and she feels a "spasm, like a little grabbing feeling" in her throat.  She notes that her nose has been sometimes runny or congested. She has not checked her temp but "I think maybe I have" had a fever.  She sometimes feels sweaty or cold at night.    No vomiting.  No diarrhea She is able to swallow food and liquids without any problem.   Nothing is "getting stuck" on the way down  She does not have much appetite but does not notice any abdominal pain after eating.    Most recently she was taking dexilant for some sort of GI problem.  Her GI Dr. Guadlupe Spanish has been treating her for ?GERD.  She saw Dr. Elease Hashimoto last year for this issue to make sure not a cardiac issue.  She had a normal echo.  She has tried several other medications but dexilant has been the only thing that really helped her She has been off of her dexilant for about a week.  She was supposed to see Dr. Guadlupe Spanish but had to cancel because her mother passed away.  He has been providing her with samples because this medication is expensive. She did buy some prilosec OTC which she is taking.    She does have her gallbladder.  She has noted problems with chest pains for about 18 months.    No SOB.   Besides these issues she is generally healthy Wt Readings from Last  3 Encounters:  10/07/13 146 lb (66.225 kg)  08/21/12 151 lb 6.4 oz (68.675 kg)  05/16/12 150 lb 6.4 oz (68.221 kg)    Patient Active Problem List   Diagnosis Date Noted  . Chest tightness 08/21/2012    Past Medical History  Diagnosis Date  . Allergy     Past Surgical History  Procedure Laterality Date  . Abdominal hysterectomy      History  Substance Use Topics  . Smoking status: Never Smoker   . Smokeless tobacco: Not on file  . Alcohol Use: No    Family History  Problem Relation Age of Onset  . Cancer Mother   . Cancer Father   . Multiple sclerosis Sister   . Alcohol abuse Brother     Allergies  Allergen Reactions  . Naproxen Sodium Anxiety    Medication list has been reviewed and updated.  Current Outpatient Prescriptions on File Prior to Visit  Medication Sig Dispense Refill  . HYDROcodone-acetaminophen (NORCO/VICODIN) 5-325 MG per tablet Take 1 tablet by mouth every 6 (six) hours as needed.      Maxwell Caul Bicarbonate (ZEGERID) 20-1100 MG CAPS Take 1 capsule by mouth daily as needed. For heartburn       No current  facility-administered medications on file prior to visit.    Review of Systems:  As per HPI- otherwise negative.   Physical Examination: Filed Vitals:   10/07/13 0821  BP: 118/78  Pulse: 103  Temp: 97.4 F (36.3 C)  Resp: 16   Filed Vitals:   10/07/13 0821  Height: 5' 3.5" (1.613 m)  Weight: 146 lb (66.225 kg)   Body mass index is 25.45 kg/(m^2). Ideal Body Weight: Weight in (lb) to have BMI = 25: 143.1  GEN: WDWN, NAD, Non-toxic, A & O x 3, looks well HEENT: Atraumatic, Normocephalic. Neck supple. No masses, No LAD.  Bilateral TM wnl, oropharynx normal.  PEERL,EOMI.   Ears and Nose: No external deformity. CV: RRR, No M/G/R. No JVD. No thrill. No extra heart sounds. PULM: CTA B, no wheezes, crackles, rhonchi. No retractions. No resp. distress. No accessory muscle use. ABD: S, NT, ND, +BS. No rebound. No HSM.   Negative Murphys  EXTR: No c/c/e NEURO Normal gait.  PSYCH: Normally interactive. Conversant. Not depressed or anxious appearing.  Calm demeanor.   Results for orders placed in visit on 10/07/13  POCT CBC      Result Value Range   WBC 9.2  4.6 - 10.2 K/uL   Lymph, poc 1.4  0.6 - 3.4   POC LYMPH PERCENT 15.4  10 - 50 %L   MID (cbc) 0.5  0 - 0.9   POC MID % 5.7  0 - 12 %M   POC Granulocyte 7.3 (*) 2 - 6.9   Granulocyte percent 78.9  37 - 80 %G   RBC 4.91  4.04 - 5.48 M/uL   Hemoglobin 14.2  12.2 - 16.2 g/dL   HCT, POC 78.2  95.6 - 47.9 %   MCV 95.6  80 - 97 fL   MCH, POC 28.9  27 - 31.2 pg   MCHC 31.2 (*) 31.8 - 35.4 g/dL   RDW, POC 21.3     Platelet Count, POC 294  142 - 424 K/uL   MPV 7.9  0 - 99.8 fL  POCT RAPID STREP A (OFFICE)      Result Value Range   Rapid Strep A Screen Negative  Negative   EKG:  NSR, no ST elevation or depression GI cocktail: did not make much difference to her Assessment and Plan: Acid reflux - Plan: gi cocktail (Maalox,Lidocaine,Donnatal), sucralfate (CARAFATE) 1 G tablet  Nausea alone - Plan: POCT CBC, Comprehensive metabolic panel, ondansetron (ZOFRAN) 8 MG tablet  Chest pain - Plan: EKG 12-Lead  Pharyngitis - Plan: POCT rapid strep A  Suspect her sx of throat scratchiness/ mouth irritation and stomach burning/ nausea are related to acid reflux.  Will use zofran and carafate.  She will inquire about dexilant samples from Dr. Kinnie Scales See patient instructions for more details.     Signed Abbe Amsterdam, MD

## 2013-10-08 ENCOUNTER — Encounter: Payer: Self-pay | Admitting: Radiology

## 2014-01-20 ENCOUNTER — Other Ambulatory Visit: Payer: Self-pay

## 2014-01-20 DIAGNOSIS — Z1231 Encounter for screening mammogram for malignant neoplasm of breast: Secondary | ICD-10-CM

## 2014-02-04 ENCOUNTER — Ambulatory Visit
Admission: RE | Admit: 2014-02-04 | Discharge: 2014-02-04 | Disposition: A | Discharge status: Home / Self Care | Payer: Medicare Other | Source: Ambulatory Visit

## 2014-02-04 DIAGNOSIS — Z1231 Encounter for screening mammogram for malignant neoplasm of breast: Secondary | ICD-10-CM

## 2014-02-05 ENCOUNTER — Other Ambulatory Visit: Payer: Self-pay | Admitting: Family Medicine

## 2014-02-05 DIAGNOSIS — R928 Other abnormal and inconclusive findings on diagnostic imaging of breast: Secondary | ICD-10-CM

## 2014-02-21 ENCOUNTER — Ambulatory Visit
Admission: RE | Admit: 2014-02-21 | Discharge: 2014-02-21 | Disposition: A | Discharge status: Home / Self Care | Payer: Medicare Other | Source: Ambulatory Visit | Attending: Family Medicine | Admitting: Family Medicine

## 2014-02-21 ENCOUNTER — Encounter (INDEPENDENT_AMBULATORY_CARE_PROVIDER_SITE_OTHER): Payer: Self-pay

## 2014-02-21 DIAGNOSIS — R928 Other abnormal and inconclusive findings on diagnostic imaging of breast: Secondary | ICD-10-CM

## 2014-03-27 ENCOUNTER — Ambulatory Visit (INDEPENDENT_AMBULATORY_CARE_PROVIDER_SITE_OTHER): Payer: Medicare Other | Admitting: Family Medicine

## 2014-03-27 VITALS — BP 124/72 | HR 75 | Temp 97.6°F | Resp 16 | Ht 63.5 in | Wt 149.4 lb

## 2014-03-27 DIAGNOSIS — L255 Unspecified contact dermatitis due to plants, except food: Secondary | ICD-10-CM

## 2014-03-27 DIAGNOSIS — L237 Allergic contact dermatitis due to plants, except food: Secondary | ICD-10-CM

## 2014-03-27 MED ORDER — TRIAMCINOLONE ACETONIDE 0.1 % EX CREA: 1.0000 "application " | TOPICAL_CREAM | Freq: Three times a day (TID) | CUTANEOUS | Status: DC

## 2014-03-27 MED ORDER — METHYLPREDNISOLONE ACETATE 80 MG/ML IJ SUSP
80.0000 mg | Freq: Once | INTRAMUSCULAR | Status: AC
  Administered 2014-03-27: 80 mg via INTRAMUSCULAR

## 2014-03-27 NOTE — Patient Instructions (Signed)
Poison Oak Poison oak is an inflammation of the skin (contact dermatitis). It is caused by contact with the allergens on the leaves of the oak (toxicodendron) plants. Depending on your sensitivity, the rash may consist simply of redness and itching, or it may also progress to blisters which may break open (rupture). These must be well cared for to prevent secondary germ (bacterial) infection as these infections can lead to scarring. The eyes may also get puffy. The puffiness is worst in the morning and gets better as the day progresses. Healing is best accomplished by keeping any open areas dry, clean, covered with a bandage, and covered with an antibacterial ointment if needed. Without secondary infection, this dermatitis usually heals without scarring within 2 to 3 weeks without treatment. HOME CARE INSTRUCTIONS When you have been exposed to poison oak, it is very important to thoroughly wash with soap and water as soon as the exposure has been discovered. You have about one half hour to remove the plant resin before it will cause the rash. This cleaning will quickly destroy the oil or antigen on the skin (the antigen is what causes the rash). Wash aggressively under the fingernails as any plant resin still there will continue to spread the rash. Do not rub skin vigorously when washing affected area. Poison oak cannot spread if no oil from the plant remains on your body. Rash that has progressed to weeping sores (lesions) will not spread the rash unless you have not washed thoroughly. It is also important to clean any clothes you have been wearing as they may carry active allergens which will spread the rash, even several days later. Avoidance of the plant in the future is the best measure. Poison oak plants can be recognized by the number of leaves. Generally, poison oak has three leaves with flowering branches on a single stem. Diphenhydramine may be purchased over the counter and used as needed for  itching. Do not drive with this medication if it makes you drowsy. Ask your caregiver about medication for children. SEEK IMMEDIATE MEDICAL CARE IF:   Open areas of the rash develop.  You notice redness extending beyond the area of the rash.  There is a pus like discharge.  There is increased pain.  Other signs of infection develop (such as fever). Document Released: 04/23/2003 Document Revised: 01/09/2012 Document Reviewed: 09/02/2009 [employer] Patient Information 2014 [employer].  

## 2014-03-27 NOTE — Progress Notes (Signed)
Subjective:  This chart was scribed for Norberto Sorenson, MD by Bronson Curb, ED Scribe. This patient was seen in room Room/bed 2 and the patient's care was started at 9:49 AM.   Patient ID: Kayla Terry, female    DOB: 10-Aug-1946, 68 y.o.   MRN: [id number]  Chief Complaint  Patient presents with  . Poison Oak    face; right arm; since monday    HPI HPI Comments: Kayla Terry is a 68 y.o. female who [facility] complaining of poison oak on her right forearm and face onset 4 days ago. Patient states she is unsure of when she made contact with poison oak, but states she noticed her right forearm started to itch 4 days ago. She states she is concerned about the rash spreading.There is associated redness and itching.  Patient has applied topical cortisone cream with no improvement.   Past Medical History  Diagnosis Date  . Allergy    Current Outpatient Prescriptions on File Prior to Visit  Medication Sig Dispense Refill  . ondansetron (ZOFRAN) 8 MG tablet Take 1/2 or 1 every 8 hours as needed  15 tablet  0  . sucralfate (CARAFATE) 1 G tablet Take 1 tablet (1 g total) by mouth 4 (four) times daily -  with meals and at bedtime. Use as needed for stomach upset.  40 tablet  0   Current Facility-Administered Medications on File Prior to Visit  Medication Dose Route Frequency Provider Last Rate Last Dose  . gi cocktail (Maalox,Lidocaine,Donnatal)  30 mL Oral Once Pearline Cables, MD       Allergies  Allergen Reactions  . Naproxen Sodium Anxiety    Review of Systems  Skin: Positive for rash.      BP 124/72  Pulse 75  Temp(Src) 97.6 F (36.4 C) (Oral)  Resp 16  Ht 5' 3.5" (1.613 m)  Wt 149 lb 6.4 oz (67.767 kg)  BMI 26.05 kg/m2  SpO2 99%  Objective:   Physical Exam  Nursing note and vitals reviewed. Constitutional: She is oriented to person, place, and time. She appears well-developed and well-nourished. No distress.  HENT:  Head:  Normocephalic and atraumatic.  Eyes: EOM are normal.  Neck: Neck supple. No tracheal deviation present.  Cardiovascular: Normal rate.   Pulmonary/Chest: Effort normal. No respiratory distress.  Musculoskeletal: Normal range of motion.  Neurological: She is alert and oriented to person, place, and time.  Skin: Skin is warm and dry. Rash noted.  Right forearm ulnar aspect patient has multiple vesicles on erythematous base. Isolated 37mm vesicle on erythematous base on the left forehead.  Psychiatric: She has a normal mood and affect. Her behavior is normal.          Assessment & Plan:   Contact dermatitis due to poison oak - Plan: methylPREDNISolone acetate (DEPO-MEDROL) injection 80 mg  Meds ordered this encounter  Medications  . HYDROcodone-acetaminophen (NORCO/VICODIN) 5-325 MG per tablet    Sig: Take 1 tablet by mouth every 6 (six) hours as needed for moderate pain.  Marland Kitchen dexlansoprazole (DEXILANT) 60 MG capsule    Sig: Take 60 mg by mouth daily.  Marland Kitchen triamcinolone cream (KENALOG) 0.1 %    Sig: Apply 1 application topically 3 (three) times daily.    Dispense:  85.2 g    Refill:  0  . methylPREDNISolone acetate (DEPO-MEDROL) injection 80 mg    Sig:     I personally performed the services described in this documentation,  which was scribed in my presence. The recorded information has been reviewed and considered, and addended by me as needed.  Delman Cheadle, MD MPH

## 2014-03-28 ENCOUNTER — Telehealth: Payer: Self-pay

## 2014-03-28 MED ORDER — METHYLPREDNISOLONE 4 MG PO KIT: PACK | ORAL | Status: DC

## 2014-03-28 NOTE — Telephone Encounter (Signed)
Sent to pharmacy. If any concern for secondary bacterial infection, RTc IMMED. RTC immed for any increased pain, redness, swelling, or purulent drainage.

## 2014-03-28 NOTE — Telephone Encounter (Signed)
Pt was in here yesterday, and saw Dr.Shaw for a injection for poison oak, says today the poison is blistering, would like to know if she can be prescribed a prednisone Pack

## 2014-03-28 NOTE — Telephone Encounter (Signed)
Pt was in here yesterday, and saw Dr.Shaw for a injection for poison oak, says today the poison is blistering, would like to know if she can be prescribed a prednisone Pack  ° °

## 2014-03-30 NOTE — Telephone Encounter (Signed)
lmom that rx was sent in

## 2014-06-16 ENCOUNTER — Ambulatory Visit (INDEPENDENT_AMBULATORY_CARE_PROVIDER_SITE_OTHER): Payer: Medicare Other | Admitting: Family Medicine

## 2014-06-16 VITALS — BP 126/80 | HR 81 | Temp 98.0°F | Resp 17 | Ht 64.0 in | Wt 150.0 lb

## 2014-06-16 DIAGNOSIS — J029 Acute pharyngitis, unspecified: Secondary | ICD-10-CM

## 2014-06-16 DIAGNOSIS — J3089 Other allergic rhinitis: Secondary | ICD-10-CM

## 2014-06-16 LAB — POCT RAPID STREP A (OFFICE): Rapid Strep A Screen: NEGATIVE

## 2014-06-16 MED ORDER — FLUTICASONE PROPIONATE 50 MCG/ACT NA SUSP: 2.0000 | Freq: Every day | NASAL | Status: DC

## 2014-06-16 NOTE — Progress Notes (Signed)
[facility] [address], [place] [id number] [phone]  Date:  06/16/2014   Name:  Kayla Terry   DOB:  December 27, 1945   MRN:  [id number]  PCP:  Pamelia HoitWILSON,FRED HENRY, MD    Chief Complaint: Sore Throat, Headache and ear pressure   History of Present Illness:  Kayla Terry is a 68 y.o. very pleasant female patient who presents with the following:  She is here today with illness.  She has noted a sore and scratchy throat, headache and ear fullness for the last 4 days or so.   She has used sudafed, excedrin, cough drops and salt water gargles.  Mild cough- dry.   No fever, but she does tend to get chilled at baseline.   No GI symptoms.   She has noted some nasal irritation.     Patient Active Problem List   Diagnosis Date Noted  . Chest tightness 08/21/2012    Past Medical History  Diagnosis Date  . Allergy     Past Surgical History  Procedure Laterality Date  . Abdominal hysterectomy      History  Substance Use Topics  . Smoking status: Never Smoker   . Smokeless tobacco: Not on file  . Alcohol Use: No    Family History  Problem Relation Age of Onset  . Cancer Mother   . Cancer Father   . Multiple sclerosis Sister   . Alcohol abuse Brother     Allergies  Allergen Reactions  . Naproxen Sodium Anxiety    Medication list has been reviewed and updated.  Current Outpatient Prescriptions on File Prior to Visit  Medication Sig Dispense Refill  . dexlansoprazole (DEXILANT) 60 MG capsule Take 60 mg by mouth daily.      Marland Kitchen. HYDROcodone-acetaminophen (NORCO/VICODIN) 5-325 MG per tablet Take 1 tablet by mouth every 6 (six) hours as needed for moderate pain.       Current Facility-Administered Medications on File Prior to Visit  Medication Dose Route Frequency Provider Last Rate Last Dose  . gi cocktail (Maalox,Lidocaine,Donnatal)  30 mL Oral Once Pearline CablesJessica C Saraiya Kozma, MD        Review of Systems:  As per HPI- otherwise  negative.   Physical Examination: Filed Vitals:   06/16/14 0852  BP: 126/80  Pulse: 81  Temp: 98 F (36.7 C)  Resp: 17   Filed Vitals:   06/16/14 0852  Height: 5\' 4"  (1.626 m)  Weight: 150 lb (68.04 kg)   Body mass index is 25.73 kg/(m^2). Ideal Body Weight: Weight in (lb) to have BMI = 25: 145.3  GEN: WDWN, NAD, Non-toxic, A & O x 3, looks well HEENT: Atraumatic, Normocephalic. Neck supple. No masses, No LAD.  Bilateral TM wnl, oropharynx normal.  PEERL,EOMI.   Ears and Nose: No external deformity. CV: RRR, No M/G/R. No JVD. No thrill. No extra heart sounds. PULM: CTA B, no wheezes, crackles, rhonchi. No retractions. No resp. distress. No accessory muscle use. EXTR: No c/c/e NEURO Normal gait.  PSYCH: Normally interactive. Conversant. Not depressed or anxious appearing.  Calm demeanor.   Results for orders placed in visit on 06/16/14  POCT RAPID STREP A (OFFICE)      Result Value Ref Range   Rapid Strep A Screen Negative  Negative    Assessment and Plan: Acute pharyngitis, unspecified pharyngitis type - Plan: POCT rapid strep A, fluticasone (FLONASE) 50 MCG/ACT nasal spray  Other allergic rhinitis - Plan: fluticasone (FLONASE) 50 MCG/ACT nasal spray  Treat  for AR as above with flonase and OTC claritin or zyrtec.  She will let me know if not better soon- Sooner if worse.     Signed Abbe Amsterdam, MD

## 2014-06-16 NOTE — Patient Instructions (Signed)
Use some flonase nasal spray, and try claritin or zyrtec OTC as needed for allergies.  Let me know if you are not better in the next week or so- Sooner if worse.

## 2016-03-08 DIAGNOSIS — K219 Gastro-esophageal reflux disease without esophagitis: Secondary | ICD-10-CM | POA: Insufficient documentation

## 2016-03-08 DIAGNOSIS — G2581 Restless legs syndrome: Secondary | ICD-10-CM | POA: Insufficient documentation

## 2016-03-08 DIAGNOSIS — E785 Hyperlipidemia, unspecified: Secondary | ICD-10-CM | POA: Insufficient documentation

## 2016-03-08 DIAGNOSIS — R351 Nocturia: Secondary | ICD-10-CM | POA: Insufficient documentation

## 2017-06-08 DIAGNOSIS — I10 Essential (primary) hypertension: Secondary | ICD-10-CM | POA: Insufficient documentation

## 2017-08-18 ENCOUNTER — Encounter: Payer: Self-pay | Admitting: Physician Assistant

## 2017-08-18 ENCOUNTER — Ambulatory Visit (INDEPENDENT_AMBULATORY_CARE_PROVIDER_SITE_OTHER): Payer: Medicare Other | Admitting: Physician Assistant

## 2017-08-18 VITALS — BP 122/76 | HR 100 | Temp 98.3°F | Resp 16 | Ht 64.0 in | Wt 144.0 lb

## 2017-08-18 DIAGNOSIS — J029 Acute pharyngitis, unspecified: Secondary | ICD-10-CM

## 2017-08-18 DIAGNOSIS — R059 Cough, unspecified: Secondary | ICD-10-CM

## 2017-08-18 DIAGNOSIS — R05 Cough: Secondary | ICD-10-CM

## 2017-08-18 LAB — POCT RAPID STREP A (OFFICE): Rapid Strep A Screen: NEGATIVE

## 2017-08-18 MED ORDER — GUAIFENESIN ER 600 MG PO TB12: 600.0000 mg | ORAL_TABLET | Freq: Two times a day (BID) | ORAL | 0 refills | Status: DC

## 2017-08-18 MED ORDER — BENZONATATE 100 MG PO CAPS: 100.0000 mg | ORAL_CAPSULE | Freq: Three times a day (TID) | ORAL | 0 refills | Status: DC | PRN

## 2017-08-18 NOTE — Progress Notes (Signed)
Nat ChristenMarcia Terry  MRN: [id number] DOB: 12-03-1945  PCP: Barbie BannerWilson, Fred H, MD  Subjective:  Pt is a 71 year old female PMH HTN, HLD, acid reflux who presents to clinic for sore throat x 4 days.  Endorses headache x 4 days. C/o cough with gray mucus. She is only coughing when she feels the need to "get the mucus up".  She has tried sudafed, and salt water gargles bid Denies runny nose, sneezing, difficulty sleeping, fever, chills, muscle aches, rash, sinus pressure. No known sick contacts.    Review of Systems  Constitutional: Negative for chills, fatigue and fever.  HENT: Positive for congestion, postnasal drip and sore throat. Negative for sinus pain, sinus pressure and trouble swallowing.   Respiratory: Positive for cough.   Cardiovascular: Negative for chest pain and palpitations.  Neurological: Negative for weakness.  Psychiatric/Behavioral: Negative for sleep disturbance.    Patient Active Problem List   Diagnosis Date Noted  . Chest tightness 08/21/2012    Current Outpatient Prescriptions on File Prior to Visit  Medication Sig Dispense Refill  . HYDROcodone-acetaminophen (NORCO/VICODIN) 5-325 MG per tablet Take 1 tablet by mouth every 6 (six) hours as needed for moderate pain.    Marland Kitchen. dexlansoprazole (DEXILANT) 60 MG capsule Take 60 mg by mouth daily.    . fluticasone (FLONASE) 50 MCG/ACT nasal spray Place 2 sprays into both nostrils daily. (Patient not taking: Reported on 08/18/2017) 16 g 6   Current Facility-Administered Medications on File Prior to Visit  Medication Dose Route Frequency Provider Last Rate Last Dose  . gi cocktail (Maalox,Lidocaine,Donnatal)  30 mL Oral Once Copland, Gwenlyn FoundJessica C, MD        Allergies  Allergen Reactions  . Naproxen Sodium Anxiety     Objective:  BP 122/76   Pulse 100   Temp 98.3 F (36.8 C) (Oral)   Resp 16   Ht 5\' 4"  (1.626 m)   Wt 144 lb (65.3 kg)   SpO2 96%   BMI 24.72 kg/m   Physical Exam  Constitutional: She is oriented to  person, place, and time and well-developed, well-nourished, and in no distress. No distress.  HENT:  Right Ear: Tympanic membrane normal.  Left Ear: Tympanic membrane normal.  Nose: Mucosal edema present. No rhinorrhea. Right sinus exhibits no maxillary sinus tenderness and no frontal sinus tenderness. Left sinus exhibits no maxillary sinus tenderness and no frontal sinus tenderness.  Mouth/Throat: Mucous membranes are normal. Posterior oropharyngeal erythema present. No oropharyngeal exudate or posterior oropharyngeal edema.  Cardiovascular: Normal rate, regular rhythm and normal heart sounds.   Pulmonary/Chest: Effort normal and breath sounds normal. No respiratory distress. She has no wheezes. She has no rales.  Neurological: She is alert and oriented to person, place, and time. GCS score is 15.  Skin: Skin is warm and dry.  Psychiatric: Mood, memory, affect and judgment normal.  Vitals reviewed.   Results for orders placed or performed in visit on 08/18/17  POCT rapid strep A  Result Value Ref Range   Rapid Strep A Screen Negative Negative    Assessment and Plan :  1. Sore throat - POCT rapid strep A - Culture, Group A Strep  2. Cough - guaiFENesin (MUCINEX) 600 MG 12 hr tablet; Take 1 tablet (600 mg total) by mouth 2 (two) times daily.  Dispense: 14 tablet; Refill: 0 - benzonatate (TESSALON) 100 MG capsule; Take 1-2 capsules (100-200 mg total) by mouth 3 (three) times daily as needed for cough.  Dispense: 40 capsule; Refill:  0 - Rapid strep is negative. Plan to treat supportively. Advised rest and hydration.  Supportive care discussed.  RTC in 5 days if no improvement.    Marco Collie, PA-C  [facility] at [facility] 08/18/2017 8:18 AM

## 2017-08-18 NOTE — Patient Instructions (Addendum)
You may take Tylenol for throat pain. You may also gargle liquid Benadryl.  Mucinex helps loosen phlegm (mucus) and thin bronchial secretions to make coughs more productive.   See below for instructions for care at home.  We will contact you with the results of today's throat swab - this is being sent to test if any bacteria developes. I will send in an antibiotic to your pharmacy if needed.   Come back on Monday if you are still not better.   Stay well hydrated.   For sore throat try using a honey-based tea. Use 3 teaspoons of honey with juice squeezed from half lemon. Place shaved pieces of ginger into 1/2-1 cup of water and warm over stove top. Then mix the ingredients and repeat every 4 hours as needed.  Cough Syrup Recipe: Sweet Lemon & Honey Thyme  Ingredients a handful of fresh thyme sprigs   1 pint of water (2 cups)  1/2 cup honey (raw is best, but regular will do)  1/2 lemon chopped Instructions 1. Place the lemon in the pint jar and cover with the honey. The honey will macerate the lemons and draw out liquids which taste so delicious! 2. Meanwhile, toss the thyme leaves into a saucepan and cover them with the water. 3. Bring the water to a gentle simmer and reduce it to half, about a cup of tea. 4. When the tea is reduced and cooled a bit, strain the sprigs & leaves, add it into the pint jar and stir it well. 5. Give it a shake and use a spoonful as needed. 6. Store your homemade cough syrup in the refrigerator for about a month.  Is there anything I can do on my own to get rid of my cough? Yes. To help get rid of your cough, you can: ?Use a humidifier in your bedroom ?Use an over-the-counter cough medicine, or suck on cough drops or hard candy ?Stop smoking, if you smoke ?If you have allergies, avoid the things you are allergic to (like pollen, dust, animals, or mold) If you have acid reflux, your doctor or nurse will tell you which lifestyle changes can help reduce  symptoms.   Pharyngitis Pharyngitis is redness, pain, and swelling (inflammation) of your pharynx. What are the causes? Pharyngitis is usually caused by infection. Most of the time, these infections are from viruses (viral) and are part of a cold. However, sometimes pharyngitis is caused by bacteria (bacterial). Pharyngitis can also be caused by allergies. Viral pharyngitis may be spread from person to person by coughing, sneezing, and personal items or utensils (cups, forks, spoons, toothbrushes). Bacterial pharyngitis may be spread from person to person by more intimate contact, such as kissing. What are the signs or symptoms? Symptoms of pharyngitis include:  Sore throat.  Tiredness (fatigue).  Low-grade fever.  Headache.  Joint pain and muscle aches.  Skin rashes.  Swollen lymph nodes.  Plaque-like film on throat or tonsils (often seen with bacterial pharyngitis).  How is this diagnosed? Your health care provider will ask you questions about your illness and your symptoms. Your medical history, along with a physical exam, is often all that is needed to diagnose pharyngitis. Sometimes, a rapid strep test is done. Other lab tests may also be done, depending on the suspected cause. How is this treated? Viral pharyngitis will usually get better in 3-4 days without the use of medicine. Bacterial pharyngitis is treated with medicines that kill germs (antibiotics). Follow these instructions at home:  Drink  enough water and fluids to keep your urine clear or pale yellow.  Only take over-the-counter or prescription medicines as directed by your health care provider: ? If you are prescribed antibiotics, make sure you finish them even if you start to feel better. ? Do not take aspirin.  Get lots of rest.  Gargle with 8 oz of salt water ( tsp of salt per 1 qt of water) as often as every 1-2 hours to soothe your throat.  Throat lozenges (if you are not at risk for choking) or sprays  may be used to soothe your throat. Contact a health care provider if:  You have large, tender lumps in your neck.  You have a rash.  You cough up green, yellow-brown, or bloody spit. Get help right away if:  Your neck becomes stiff.  You drool or are unable to swallow liquids.  You vomit or are unable to keep medicines or liquids down.  You have severe pain that does not go away with the use of recommended medicines.  You have trouble breathing (not caused by a stuffy nose). This information is not intended to replace advice given to you by your health care provider. Make sure you discuss any questions you have with your health care provider. Document Released: 10/17/2005 Document Revised: 03/24/2016 Document Reviewed: 06/24/2013 Elsevier Interactive Patient Education  2017 [employer]  IF you received an x-ray today, you will receive an invoice from [facility]. Please contact [facility] at [phone] with questions or concerns regarding your invoice.   IF you received labwork today, you will receive an invoice from [facility]. Please contact [facility] at [phone] with questions or concerns regarding your invoice.   Our billing staff will not be able to assist you with questions regarding bills from these companies.  You will be contacted with the lab results as soon as they are available. The fastest way to get your results is to activate your My Chart account. Instructions are located on the last page of this paperwork. If you have not heard from us regarding the results in 2 weeks, please contact this office.

## 2017-08-21 ENCOUNTER — Telehealth: Payer: Self-pay | Admitting: Physician Assistant

## 2017-08-21 LAB — CULTURE, GROUP A STREP: Strep A Culture: NEGATIVE

## 2017-08-21 NOTE — Telephone Encounter (Signed)
Can anyone else please look at this for her?  She called again.  The doctor told her to call today if she wasn't better.

## 2017-08-21 NOTE — Telephone Encounter (Signed)
Pt was seen Friday and told to take Musinex, but if she got worse to call back.  Her daughter called for her this morning because now she can barely speak.  She is not any better at all.  Can you please call her in something?  [phone]

## 2017-08-22 NOTE — Telephone Encounter (Signed)
Note says RTC if no improvement. She needs to be seen. Please call her and have her come in.  Thank you!

## 2017-08-22 NOTE — Telephone Encounter (Signed)
Please advise 

## 2017-08-24 NOTE — Telephone Encounter (Signed)
Spoke with patient this morning to follow-up on how she was feeling and she informed me that she has seen her family doctor since her visit at our office and she feels better. Informed her that if she has anymore concerns to call us back her at the office.

## 2020-03-06 ENCOUNTER — Emergency Department (HOSPITAL_COMMUNITY): Payer: Medicare Other

## 2020-03-06 ENCOUNTER — Other Ambulatory Visit: Payer: Self-pay

## 2020-03-06 ENCOUNTER — Encounter (HOSPITAL_COMMUNITY): Payer: Self-pay | Admitting: Emergency Medicine

## 2020-03-06 ENCOUNTER — Emergency Department (HOSPITAL_COMMUNITY)
Admission: EM | Admit: 2020-03-06 | Discharge: 2020-03-06 | Disposition: A | Discharge status: Home / Self Care | Payer: Medicare Other | Attending: Emergency Medicine | Admitting: Emergency Medicine

## 2020-03-06 DIAGNOSIS — R072 Precordial pain: Secondary | ICD-10-CM | POA: Insufficient documentation

## 2020-03-06 DIAGNOSIS — R0789 Other chest pain: Secondary | ICD-10-CM

## 2020-03-06 DIAGNOSIS — Z79899 Other long term (current) drug therapy: Secondary | ICD-10-CM | POA: Insufficient documentation

## 2020-03-06 DIAGNOSIS — Z7982 Long term (current) use of aspirin: Secondary | ICD-10-CM | POA: Insufficient documentation

## 2020-03-06 DIAGNOSIS — R42 Dizziness and giddiness: Secondary | ICD-10-CM | POA: Insufficient documentation

## 2020-03-06 DIAGNOSIS — I10 Essential (primary) hypertension: Secondary | ICD-10-CM | POA: Diagnosis not present

## 2020-03-06 LAB — BASIC METABOLIC PANEL
Anion gap: 13 (ref 5–15)
BUN: 9 mg/dL (ref 8–23)
CO2: 26 mmol/L (ref 22–32)
Calcium: 9.7 mg/dL (ref 8.9–10.3)
Chloride: 103 mmol/L (ref 98–111)
Creatinine, Ser: 0.96 mg/dL (ref 0.44–1.00)
GFR calc Af Amer: >60 mL/min (ref >60)
GFR calc non Af Amer: 59 mL/min — ABNORMAL LOW (ref >60)
Glucose, Bld: 117 mg/dL — ABNORMAL HIGH (ref 70–99)
Potassium: 4 mmol/L (ref 3.5–5.1)
Sodium: 142 mmol/L (ref 135–145)

## 2020-03-06 LAB — CBC
HCT: 41.4 % (ref 36.0–46.0)
Hemoglobin: 13.2 g/dL (ref 12.0–15.0)
MCH: 28.6 pg (ref 26.0–34.0)
MCHC: 31.9 g/dL (ref 30.0–36.0)
MCV: 89.6 fL (ref 80.0–100.0)
Platelets: 300 10*3/uL (ref 150–400)
RBC: 4.62 MIL/uL (ref 3.87–5.11)
RDW: 13.1 % (ref 11.5–15.5)
WBC: 5.9 10*3/uL (ref 4.0–10.5)
nRBC: 0 % (ref 0.0–0.2)

## 2020-03-06 LAB — TROPONIN I (HIGH SENSITIVITY)
Troponin I (High Sensitivity): 5 ng/L (ref <18)
Troponin I (High Sensitivity): 4 ng/L (ref <18)

## 2020-03-06 MED ORDER — HYDROCODONE-ACETAMINOPHEN 5-325 MG PO TABS
1.0000 | ORAL_TABLET | Freq: Once | ORAL | Status: AC
  Administered 2020-03-06: 1 via ORAL
  Filled 2020-03-06: qty 1

## 2020-03-06 MED ORDER — SODIUM CHLORIDE 0.9% FLUSH
3.0000 mL | Freq: Once | INTRAVENOUS | Status: AC
  Administered 2020-03-06: 3 mL via INTRAVENOUS

## 2020-03-06 MED ORDER — AMLODIPINE BESYLATE 5 MG PO TABS
5.0000 mg | ORAL_TABLET | Freq: Once | ORAL | Status: AC
  Administered 2020-03-06: 21:00:00 5 mg via ORAL
  Filled 2020-03-06: qty 1

## 2020-03-06 MED ORDER — LIDOCAINE VISCOUS HCL 2 % MT SOLN
15.0000 mL | Freq: Once | OROMUCOSAL | Status: AC
  Administered 2020-03-06: 15 mL via ORAL
  Filled 2020-03-06: qty 15

## 2020-03-06 MED ORDER — SUCRALFATE 1 G PO TABS
1.0000 g | ORAL_TABLET | Freq: Three times a day (TID) | ORAL | 0 refills | Status: DC
Start: 2020-03-06 — End: 2020-04-02

## 2020-03-06 MED ORDER — LABETALOL HCL 5 MG/ML IV SOLN
20.0000 mg | Freq: Once | INTRAVENOUS | Status: AC
  Administered 2020-03-06: 20 mg via INTRAVENOUS
  Filled 2020-03-06: qty 4

## 2020-03-06 MED ORDER — ALUM & MAG HYDROXIDE-SIMETH 200-200-20 MG/5ML PO SUSP
30.0000 mL | Freq: Once | ORAL | Status: AC
  Administered 2020-03-06: 17:00:00 30 mL via ORAL
  Filled 2020-03-06: qty 30

## 2020-03-06 MED ORDER — AMLODIPINE BESYLATE 5 MG PO TABS
5.0000 mg | ORAL_TABLET | Freq: Every day | ORAL | 0 refills | Status: DC
Start: 2020-03-06 — End: 2020-04-08

## 2020-03-06 NOTE — ED Provider Notes (Signed)
[facility] EMERGENCY DEPARTMENT Provider Note   CSN: [id number] Arrival date & time: 03/06/20  1207     History Chief Complaint  Patient presents with  . Chest Pain  . Dizziness    Kayla Terry is a 74 y.o. female.  Patient is a 74 year old female with a history of hyperlipidemia, hypertension and GERD who is presenting today with multiple complaints.  Patient states that last night around 5 PM she started getting extreme discomfort in the substernal segment of her chest that went into her throat.  She describes it as a deep uncomfortable pain.  She reports that she feels this pain quite often during the day and will take Prevacid but it became severe last night and it did not get better with Prevacid.  She denies the pain radiating into her back or arms.  She was not short of breath or having nausea vomiting with it.  She states in addition she was also feeling dizzy where she felt like things were spinning even when she was sitting down.  She took her blood pressure and it was elevated to 170-180/90-100.  She reports she takes no blood pressure medication at home and does not ever recall her blood pressure being that high.  Throughout the night she continued to have discomfort but did take an Excedrin.  Because she was still having discomfort today she went and saw her doctor.  At the doctor's office she had persistent hypertension and given her ongoing symptoms they were concerned and called paramedics.  Patient did receive 324 of aspirin and nitroglycerin in route with resolution of her symptoms.  She reports after waiting in the waiting room for 3 hours her symptoms are starting to come back but were not as severe as they were prior to the medications.  She denies any new exertional dyspnea, unilateral leg pain or swelling.  No vision changes, speech abnormalities, unilateral weakness or numbness.  She does not feel like eating makes her pain worse.  She is still having mild  dizziness when she was sitting in the wheelchair but feels better now that she is in the bed.  She did recently lose her husband a few months ago and reports she has not been eating as well as she normally would and has not been sleeping as well.  She does take hydrocodone daily for chronic pain and gabapentin intermittently as well as Ambien as needed to help her sleep.  She is otherwise on no other medications and takes no over-the-counter medications.  She does report with having this chest pain in the past in 2013 she was evaluated by her doctor had a stress test and everything was normal.  She did have an endoscopy as well and was told she needs to take Prevacid which she reports usually helps.  The history is provided by the patient.  Chest Pain Pain location:  Substernal area Associated symptoms: dizziness   Dizziness Associated symptoms: chest pain        Past Medical History:  Diagnosis Date  . Allergy     Patient Active Problem List   Diagnosis Date Noted  . Benign essential hypertension 06/08/2017  . Acid reflux disease 03/08/2016  . Hyperlipidemia 03/08/2016  . Nocturia 03/08/2016  . Restless leg syndrome 03/08/2016  . Chest tightness 08/21/2012    Past Surgical History:  Procedure Laterality Date  . ABDOMINAL HYSTERECTOMY       OB History   No obstetric history on file.  Family History  Problem Relation Age of Onset  . Cancer Mother   . Cancer Father   . Multiple sclerosis Sister   . Alcohol abuse Brother     Social History   Tobacco Use  . Smoking status: Never Smoker  . Smokeless tobacco: Never Used  Substance Use Topics  . Alcohol use: No  . Drug use: No    Home Medications Prior to Admission medications   Medication Sig Start Date End Date Taking? Authorizing Provider  aspirin 81 MG chewable tablet Chew 81 mg by mouth at bedtime as needed (for chest discomfort).    Yes [provider]  aspirin-acetaminophen-caffeine (EXCEDRIN  MIGRAINE) [id number] MG tablet Take 1 tablet by mouth every 6 (six) hours as needed for headache (or pain).   Yes [provider]  gabapentin (NEURONTIN) 100 MG capsule Take 100 mg by mouth at bedtime as needed (for restless legs).    Yes [provider]  HYDROcodone-acetaminophen (NORCO/VICODIN) 5-325 MG per tablet Take 1 tablet by mouth at bedtime.    Yes [provider]  lansoprazole (PREVACID) 15 MG capsule Take 15 mg by mouth daily as needed (for GERD-like symptoms).    Yes [provider]  latanoprost (XALATAN) 0.005 % ophthalmic solution Place 1 drop into both eyes daily at 12 noon.  02/20/20  Yes [provider]  neomycin-polymyxin b-dexamethasone (MAXITROL) 3.5-10000-0.1 OINT Place 1 application into the right eye in the morning and at bedtime. FOR 7 DAYS 03/02/20 03/09/20 Yes [provider]  NON FORMULARY Take 1 capsule by mouth See admin instructions. Flora Balance O'Donnell Formula Capsules: Take 1 capsule by mouth in the morning   Yes [provider]  zolpidem (AMBIEN) 10 MG tablet Take 10 mg by mouth at bedtime.  03/06/20  Yes [provider]  benzonatate (TESSALON) 100 MG capsule Take 1-2 capsules (100-200 mg total) by mouth 3 (three) times daily as needed for cough. Patient not taking: Reported on 03/06/2020 08/18/17   McVey, Madelaine BhatElizabeth Ameen Mostafa, PA-C  fluticasone [facility](FLONASE) 50 MCG/ACT nasal spray Place 2 sprays into both nostrils daily. Patient not taking: Reported on 03/06/2020 06/16/14   Copland, Gwenlyn FoundJessica C, MD  guaiFENesin (MUCINEX) 600 MG 12 hr tablet Take 1 tablet (600 mg total) by mouth 2 (two) times daily. Patient not taking: Reported on 03/06/2020 08/18/17   McVey, Madelaine BhatElizabeth Briggs Edelen, PA-C    Allergies    Naproxen sodium  Review of Systems   Review of Systems  Cardiovascular: Positive for chest pain.  Neurological: Positive for dizziness.  All other systems reviewed and are negative.   Physical Exam Updated  Vital Signs BP (!) 149/88   Pulse 73   Temp 97.9 F (36.6 C) (Oral)   Resp 17   Ht 5\' 3"  (1.6 m)   Wt 67.1 kg   SpO2 96%   BMI 26.22 kg/m   Physical Exam Vitals and nursing note reviewed.  Constitutional:      General: She is not in acute distress.    Appearance: She is well-developed and normal weight.  HENT:     Head: Normocephalic and atraumatic.  Eyes:     Extraocular Movements: Extraocular movements intact.     Pupils: Pupils are equal, round, and reactive to light.  Cardiovascular:     Rate and Rhythm: Normal rate and regular rhythm.     Pulses: Normal pulses.     Heart sounds: Normal heart sounds. No murmur. No friction rub.  Pulmonary:     Effort:  Pulmonary effort is normal.     Breath sounds: Normal breath sounds. No wheezing or rales.  Abdominal:     General: Bowel sounds are normal. There is no distension.     Palpations: Abdomen is soft.     Tenderness: There is no abdominal tenderness. There is no guarding or rebound.  Musculoskeletal:        General: No tenderness. Normal range of motion.     Comments: No edema  Skin:    General: Skin is warm and dry.     Findings: No rash.  Neurological:     General: No focal deficit present.     Mental Status: She is alert and oriented to person, place, and time. Mental status is at baseline.     Cranial Nerves: No cranial nerve deficit.     Motor: No weakness.     Gait: Gait normal.     Comments: No nystagmus, normal heel-to-shin bilaterally, no pronator drift, normal finger-to-nose.  No visual field cuts.  Psychiatric:        Mood and Affect: Mood normal.        Behavior: Behavior normal.        Thought Content: Thought content normal.     ED Results / Procedures / Treatments   Labs (all labs ordered are listed, but only abnormal results are displayed) Labs Reviewed  BASIC METABOLIC PANEL - Abnormal; Notable for the following components:      Result Value   Glucose, Bld 117 (*)    GFR calc non Af Amer 59  (*)    All other components within normal limits  CBC  TROPONIN I (HIGH SENSITIVITY)  TROPONIN I (HIGH SENSITIVITY)    EKG EKG Interpretation  Date/Time:  Friday Mar 06 2020 12:07:12 EDT Ventricular Rate:  74 PR Interval:  200 QRS Duration: 124 QT Interval:  420 QTC Calculation: 466 R Axis:   -11 Text Interpretation: Normal sinus rhythm new Left bundle branch block  from 2013 Confirmed by Gwyneth Sprout ([id number]) on 03/06/2020 4:21:00 PM   Radiology DG Chest 2 View  Result Date: 03/06/2020 CLINICAL DATA:  Chest pain and dizziness since last evening. EXAM: CHEST - 2 VIEW COMPARISON:  03/30/2012 FINDINGS: The cardiac silhouette, mediastinal and hilar contours are within normal limits given the significant pectus deformity. Prominent infrahilar vascular markings on the right side are typical. No infiltrates, edema or effusions. No pneumothorax. The bony thorax is intact. IMPRESSION: 1. Significant pectus deformity. 2. No acute cardiopulmonary findings. Electronically Signed   By: Rudie Meyer M.D.   On: 03/06/2020 12:47   MR BRAIN WO CONTRAST  Result Date: 03/06/2020 CLINICAL DATA:  Vertigo, peripheral. Additional history provided: central chest pain and dizziness which began last night, pain continued throughout the night and worsened this morning, blood pressure with systolic in the 170's. EXAM: MRI HEAD WITHOUT CONTRAST TECHNIQUE: Multiplanar, multiecho pulse sequences of the brain and surrounding structures were obtained without intravenous contrast. COMPARISON:  No pertinent prior studies available for comparison. FINDINGS: Brain: The patient was unable to tolerate the full examination. Only an axial diffusion-weighted sequence, coronal diffusion-weighted sequence and sagittal T1 weighted sequence could be obtained. The sagittal T1 weighted sequence is severely motion degraded. There is no evidence of acute infarct. Vascular: Poorly assessed on the acquired sequences. Skull and upper  cervical spine: No focal marrow lesion is identified within described limitations. Sinuses/Orbits: Poorly assessed on the acquired sequences. IMPRESSION: Prematurely terminated examination only consisting of diffusion-weighted imaging and a severely  motion degraded sagittal T1-weighted sequence. No evidence of acute infarct. Electronically Signed   By: Kellie Simmering DO   On: 03/06/2020 18:54    Procedures Procedures (including critical care time)  Medications Ordered in ED Medications  sodium chloride flush (NS) 0.9 % injection 3 mL (3 mLs Intravenous Given 03/06/20 1704)  alum & mag hydroxide-simeth (MAALOX/MYLANTA) 200-200-20 MG/5ML suspension 30 mL (30 mLs Oral Given 03/06/20 1704)    And  lidocaine (XYLOCAINE) 2 % viscous mouth solution 15 mL (15 mLs Oral Given 03/06/20 1704)  labetalol (NORMODYNE) injection 20 mg (20 mg Intravenous Given 03/06/20 1705)  HYDROcodone-acetaminophen (NORCO/VICODIN) 5-325 MG per tablet 1 tablet (1 tablet Oral Given 03/06/20 1812)    ED Course  I have reviewed the triage vital signs and the nursing notes.  Pertinent labs & imaging results that were available during my care of the patient were reviewed by me and considered in my medical decision making (see chart for details).    MDM Rules/Calculators/A&P                      Elderly female presenting today with chest pain, hypertension and complaints of dizziness and headache.  Patient reports symptoms became severe last night around 5:00 and persisted until seeing her doctor today.  The dizziness is somewhat positional but also present at rest.  It did improve after receiving nitroglycerin and aspirin by paramedics.  Patient states after waiting in the waiting room however symptoms are starting to return.  Patient's EKG shows no evidence of ACS, chest x-ray is within normal limits, delta troponin within normal limits, BMP and CBC within normal limits.  However patient is hypertensive here 409W over 119 systolic without  prior history of even needing blood pressure medication.  With patient's dizziness and other symptoms concern for hypertensive urgency or press.  Also concern for possible posterior stroke with a complaint of dizziness.  Patient after receiving labetalol had significant improvement of blood pressure to 149/88 with ongoing normal pulse.  MRI of the brain is pending.  At this time low suspicion for PE or dissection.  Patient's pain also could be GI in nature as she has a long history of taking Prevacid and having pain on a daily basis at times.  8:49 PM Patient's delta troponin is within normal limits.  After labetalol patient's blood pressure remains improved.  Last read was 152/92.  Patient reports the dizziness has almost completely resolved.  The chest pain was better after GI cocktail and she reports it intermittently bothers her but low suspicion that this is ACS in nature.  Gave patient the option for admission for blood pressure control and ensuring that dizziness does not return versus going home and starting a low-dose blood pressure medication and following up with PCP early next week.  She also understands that the MRI did have artifact and was not of the best quality but no acute infarct was visualized.  She is present with her son and wishes to go home.  She knows that symptoms could get worse and she may need to return to the emergency room.  Shared decision-making was had with patient and her family.  At this time feel it is reasonable to allow her to go home follow-up with her doctor.  Patient was started on amlodipine.  She will also start taking her Prevacid regularly and Carafate was also added in.  In the future she may need endoscopy.   Final Clinical Impression(s) /  ED Diagnoses Final diagnoses:  Dizziness  Atypical chest pain  Hypertension, unspecified type    Rx / DC Orders ED Discharge Orders         Ordered    sucralfate (CARAFATE) 1 g tablet  3 times daily with meals &  bedtime     03/06/20 2047    amLODipine (NORVASC) 5 MG tablet  Daily     03/06/20 2047           Gwyneth Sprout, MD 03/06/20 2052

## 2020-03-06 NOTE — ED Triage Notes (Signed)
Pt arrives to ED from home with complaints of centralized chest pain and dizziness that started last night. The pain continued thoughout the night and worsened this morning. EMS initial BP was 170's systolic. EMS gave 324 ASA and x1 nitro which resolved patients pain.

## 2020-03-06 NOTE — Discharge Instructions (Signed)
The blood work today showed no evidence of heart attack.  Your blood pressure was very high when you arrived here but the medicine that we gave you seems to have brought your blood pressure down.  This may also be the reason why you are feeling dizzy earlier.  The chest discomfort you are feeling may be something completely unrelated and may be more related to your stomach.  I would like you to start taking your Prevacid regularly for the next 3 or 4 days also you can start taking Carafate before meals and before bed.  We are also can a start you on a low-dose of blood pressure medication and it will be important for you to call your doctor to get follow-up early next week to recheck your blood pressure and make sure you are tolerating the medicine.  If over the weekend the dizziness becomes worse you have trouble walking, any change in your vision or any symptoms of weakness you should return immediately to the emergency room.

## 2020-03-06 NOTE — ED Notes (Addendum)
Pt transported to MRI 

## 2020-03-19 ENCOUNTER — Emergency Department (HOSPITAL_COMMUNITY): Payer: Medicare Other

## 2020-03-19 ENCOUNTER — Emergency Department (HOSPITAL_COMMUNITY)
Admission: EM | Admit: 2020-03-19 | Discharge: 2020-03-19 | Disposition: A | Discharge status: Home / Self Care | Payer: Medicare Other | Attending: Emergency Medicine | Admitting: Emergency Medicine

## 2020-03-19 ENCOUNTER — Encounter (HOSPITAL_COMMUNITY): Payer: Self-pay | Admitting: Emergency Medicine

## 2020-03-19 ENCOUNTER — Other Ambulatory Visit: Payer: Self-pay

## 2020-03-19 DIAGNOSIS — R140 Abdominal distension (gaseous): Secondary | ICD-10-CM | POA: Insufficient documentation

## 2020-03-19 DIAGNOSIS — I10 Essential (primary) hypertension: Secondary | ICD-10-CM | POA: Diagnosis not present

## 2020-03-19 DIAGNOSIS — R42 Dizziness and giddiness: Secondary | ICD-10-CM | POA: Diagnosis not present

## 2020-03-19 DIAGNOSIS — R519 Headache, unspecified: Secondary | ICD-10-CM | POA: Diagnosis not present

## 2020-03-19 DIAGNOSIS — K219 Gastro-esophageal reflux disease without esophagitis: Secondary | ICD-10-CM | POA: Diagnosis not present

## 2020-03-19 DIAGNOSIS — R109 Unspecified abdominal pain: Secondary | ICD-10-CM | POA: Diagnosis not present

## 2020-03-19 DIAGNOSIS — R110 Nausea: Secondary | ICD-10-CM | POA: Diagnosis not present

## 2020-03-19 LAB — COMPREHENSIVE METABOLIC PANEL
ALT: 15 U/L (ref 0–44)
AST: 17 U/L (ref 15–41)
Albumin: 4.2 g/dL (ref 3.5–5.0)
Alkaline Phosphatase: 83 U/L (ref 38–126)
Anion gap: 10 (ref 5–15)
BUN: 9 mg/dL (ref 8–23)
CO2: 26 mmol/L (ref 22–32)
Calcium: 9.2 mg/dL (ref 8.9–10.3)
Chloride: 103 mmol/L (ref 98–111)
Creatinine, Ser: 0.78 mg/dL (ref 0.44–1.00)
GFR calc Af Amer: >60 mL/min (ref >60)
GFR calc non Af Amer: >60 mL/min (ref >60)
Glucose, Bld: 125 mg/dL — ABNORMAL HIGH (ref 70–99)
Potassium: 3.4 mmol/L — ABNORMAL LOW (ref 3.5–5.1)
Sodium: 139 mmol/L (ref 135–145)
Total Bilirubin: 0.5 mg/dL (ref 0.3–1.2)
Total Protein: 7.1 g/dL (ref 6.5–8.1)

## 2020-03-19 LAB — CBC WITH DIFFERENTIAL/PLATELET
Abs Immature Granulocytes: 0.02 10*3/uL (ref 0.00–0.07)
Basophils Absolute: 0 10*3/uL (ref 0.0–0.1)
Basophils Relative: 1 %
Eosinophils Absolute: 0.1 10*3/uL (ref 0.0–0.5)
Eosinophils Relative: 2 %
HCT: 39.4 % (ref 36.0–46.0)
Hemoglobin: 12.8 g/dL (ref 12.0–15.0)
Immature Granulocytes: 0 %
Lymphocytes Relative: 20 %
Lymphs Abs: 1.2 10*3/uL (ref 0.7–4.0)
MCH: 29.1 pg (ref 26.0–34.0)
MCHC: 32.5 g/dL (ref 30.0–36.0)
MCV: 89.5 fL (ref 80.0–100.0)
Monocytes Absolute: 0.5 10*3/uL (ref 0.1–1.0)
Monocytes Relative: 7 %
Neutro Abs: 4.3 10*3/uL (ref 1.7–7.7)
Neutrophils Relative %: 70 %
Platelets: 269 10*3/uL (ref 150–400)
RBC: 4.4 MIL/uL (ref 3.87–5.11)
RDW: 13.2 % (ref 11.5–15.5)
WBC: 6.2 10*3/uL (ref 4.0–10.5)
nRBC: 0 % (ref 0.0–0.2)

## 2020-03-19 LAB — URINALYSIS, ROUTINE W REFLEX MICROSCOPIC
Bilirubin Urine: NEGATIVE
Glucose, UA: NEGATIVE mg/dL
Hgb urine dipstick: NEGATIVE
Ketones, ur: NEGATIVE mg/dL
Leukocytes,Ua: NEGATIVE
Nitrite: NEGATIVE
Protein, ur: NEGATIVE mg/dL
Specific Gravity, Urine: 1.003 — ABNORMAL LOW (ref 1.005–1.030)
pH: 7 (ref 5.0–8.0)

## 2020-03-19 MED ORDER — ONDANSETRON HCL 4 MG PO TABS
4.0000 mg | ORAL_TABLET | Freq: Once | ORAL | Status: AC
  Administered 2020-03-19: 4 mg via ORAL
  Filled 2020-03-19: qty 1

## 2020-03-19 MED ORDER — MECLIZINE HCL 25 MG PO TABS
25.0000 mg | ORAL_TABLET | Freq: Four times a day (QID) | ORAL | 0 refills | Status: DC | PRN
Start: 2020-03-19 — End: 2020-04-02

## 2020-03-19 MED ORDER — ONDANSETRON HCL 4 MG PO TABS
4.0000 mg | ORAL_TABLET | Freq: Four times a day (QID) | ORAL | 0 refills | Status: DC | PRN
Start: 2020-03-19 — End: 2020-04-02

## 2020-03-19 MED ORDER — IOHEXOL 300 MG/ML  SOLN
100.0000 mL | Freq: Once | INTRAMUSCULAR | Status: AC | PRN
  Administered 2020-03-19: 100 mL via INTRAVENOUS

## 2020-03-19 MED ORDER — MECLIZINE HCL 12.5 MG PO TABS
25.0000 mg | ORAL_TABLET | Freq: Once | ORAL | Status: AC
  Administered 2020-03-19: 25 mg via ORAL
  Filled 2020-03-19: qty 2

## 2020-03-19 NOTE — ED Triage Notes (Signed)
Recently discharged from [facility] and back tonight with nausea, weakness, and generalized feeling unwell.

## 2020-03-19 NOTE — ED Provider Notes (Signed)
[name] EMERGENCY DEPARTMENT Provider Note   CSN: [id number] Arrival date & time: 03/19/20  0116   Time seen 2:15 AM  History Chief Complaint  Patient presents with  . Nausea    Kayla Terry is a 74 y.o. female.  HPI   Patient states she has been doing well and was always healthy until about 2 weeks ago when she started "feeling lousy".  She states it started out with the feeling of her house was spinning 2 weeks ago from today.  The following day she went to see her PCP and was noted to have a elevated blood pressure.  She was sent down to [facility], ED and was evaluated with a MRI of the brain which did not show a stroke.  She states she was told she did not have vertigo.  She was discharged home on amlodipine for her blood pressure.  She also had had some chest discomfort and trouble swallowing and she had been on Prevacid for that.  They told her to stop the Prevacid and start sucralfate.  She states after a few days it made her have cottonmouth so she was seen again by her PCP and was told to stop the sucralfate and go back on the Prevacid.  She states she has been seen by her PCP at least 4 times since that last ED visit.  She states she has had nausea all week.  She still has some dizziness but not as bad as it was before.  She has headache in the posterior part of her head in her upper neck.  She denies vomiting, abdominal pain, diarrhea, feeling lightheaded.  She states she is her abdomen feels bloated even when she is not eating.  She states the nausea is there constantly.  In reviewing the ED note from that visit it was noted her husband had died recently.  She denies feeling depressed.  She states she does not feel like doing anything.  She states the week before she started feeling bad she was started on Xalactan eyedrops for glaucoma, however she stopped them a week ago and her symptoms have not improved.  PCP [name], MD   Past Medical History:  Diagnosis Date  .  Allergy     Patient Active Problem List   Diagnosis Date Noted  . Benign essential hypertension 06/08/2017  . Acid reflux disease 03/08/2016  . Hyperlipidemia 03/08/2016  . Nocturia 03/08/2016  . Restless leg syndrome 03/08/2016  . Chest tightness 08/21/2012    Past Surgical History:  Procedure Laterality Date  . ABDOMINAL HYSTERECTOMY       OB History   No obstetric history on file.     Family History  Problem Relation Age of Onset  . Cancer Mother   . Cancer Father   . Multiple sclerosis Sister   . Alcohol abuse Brother     Social History   Tobacco Use  . Smoking status: Never Smoker  . Smokeless tobacco: Never Used  Substance Use Topics  . Alcohol use: No  . Drug use: No    Home Medications Prior to Admission medications   Medication Sig Start Date End Date Taking? Authorizing Provider  amLODipine (NORVASC) 5 MG tablet Take 1 tablet (5 mg total) by mouth daily. 03/06/20   Gwyneth Sprout, MD  aspirin 81 MG chewable tablet Chew 81 mg by mouth at bedtime as needed (for chest discomfort).     [provider]  aspirin-acetaminophen-caffeine (EXCEDRIN MIGRAINE) [phone] MG  tablet Take 1 tablet by mouth every 6 (six) hours as needed for headache (or pain).    [provider]  benzonatate (TESSALON) 100 MG capsule Take 1-2 capsules (100-200 mg total) by mouth 3 (three) times daily as needed for cough. Patient not taking: Reported on 03/06/2020 08/18/17   McVey, Gelene Mink, PA-C  fluticasone [facility]) 50 MCG/ACT nasal spray Place 2 sprays into both nostrils daily. Patient not taking: Reported on 03/06/2020 06/16/14   Copland, Gay Filler, MD  gabapentin (NEURONTIN) 100 MG capsule Take 100 mg by mouth at bedtime as needed (for restless legs).     [provider]  guaiFENesin (MUCINEX) 600 MG 12 hr tablet Take 1 tablet (600 mg total) by mouth 2 (two) times daily. Patient not taking: Reported on 03/06/2020 08/18/17   McVey, Gelene Mink,  PA-C  HYDROcodone-acetaminophen (NORCO/VICODIN) 5-325 MG per tablet Take 1 tablet by mouth at bedtime.     [provider]  lansoprazole (PREVACID) 15 MG capsule Take 15 mg by mouth daily as needed (for GERD-like symptoms).     [provider]  latanoprost (XALATAN) 0.005 % ophthalmic solution Place 1 drop into both eyes daily at 12 noon.  02/20/20   [provider]  meclizine (ANTIVERT) 25 MG tablet Take 1 tablet (25 mg total) by mouth every 6 (six) hours as needed for dizziness. 03/19/20   Rolland Porter, MD  NON FORMULARY Take 1 capsule by mouth See admin instructions. Flora Balance O'Donnell Formula Capsules: Take 1 capsule by mouth in the morning    [provider]  ondansetron (ZOFRAN) 4 MG tablet Take 1 tablet (4 mg total) by mouth every 6 (six) hours as needed for nausea or vomiting. 03/19/20   Rolland Porter, MD  sucralfate (CARAFATE) 1 g tablet Take 1 tablet (1 g total) by mouth 4 (four) times daily -  with meals and at bedtime. 03/06/20   Blanchie Dessert, MD  zolpidem (AMBIEN) 10 MG tablet Take 10 mg by mouth at bedtime.  03/06/20   [provider]    Allergies    Naproxen sodium  Review of Systems   Review of Systems  All other systems reviewed and are negative.   Physical Exam Updated Vital Signs BP (!) 147/84   Pulse 90   Temp 97.9 F (36.6 C) (Oral)   Resp 17   Ht 5\' 3"  (1.6 m)   Wt 67 kg   SpO2 96%   BMI 26.17 kg/m   Physical Exam Vitals and nursing note reviewed.  Constitutional:      Appearance: Normal appearance. She is normal weight.     Comments: Very pleasant  HENT:     Head: Normocephalic and atraumatic.     Right Ear: External ear normal.     Nose: Nose normal.     Mouth/Throat:     Mouth: Mucous membranes are dry.  Eyes:     Extraocular Movements: Extraocular movements intact.     Right eye: No nystagmus.     Left eye: No nystagmus.     Conjunctiva/sclera: Conjunctivae normal.     Pupils: Pupils are equal, round,  and reactive to light.  Cardiovascular:     Rate and Rhythm: Normal rate and regular rhythm.     Pulses: Normal pulses.  Pulmonary:     Effort: Pulmonary effort is normal. No respiratory distress.     Breath sounds: Normal breath sounds.  Musculoskeletal:        General: Normal range of motion.  Cervical back: Normal range of motion and neck supple.  Skin:    General: Skin is warm and dry.     Findings: No erythema.  Neurological:     General: No focal deficit present.     Mental Status: She is alert and oriented to person, place, and time.     Cranial Nerves: No cranial nerve deficit.  Psychiatric:        Mood and Affect: Mood is anxious.        Behavior: Behavior normal.        Thought Content: Thought content normal.     ED Results / Procedures / Treatments   Labs (all labs ordered are listed, but only abnormal results are displayed) Results for orders placed or performed during the hospital encounter of 03/19/20  Comprehensive metabolic panel  Result Value Ref Range   Sodium 139 135 - 145 mmol/L   Potassium 3.4 (L) 3.5 - 5.1 mmol/L   Chloride 103 98 - 111 mmol/L   CO2 26 22 - 32 mmol/L   Glucose, Bld 125 (H) 70 - 99 mg/dL   BUN 9 8 - 23 mg/dL   Creatinine, Ser 2.62 0.44 - 1.00 mg/dL   Calcium 9.2 8.9 - 03.5 mg/dL   Total Protein 7.1 6.5 - 8.1 g/dL   Albumin 4.2 3.5 - 5.0 g/dL   AST 17 15 - 41 U/L   ALT 15 0 - 44 U/L   Alkaline Phosphatase 83 38 - 126 U/L   Total Bilirubin 0.5 0.3 - 1.2 mg/dL   GFR calc non Af Amer >60 >60 mL/min   GFR calc Af Amer >60 >60 mL/min   Anion gap 10 5 - 15  CBC with Differential  Result Value Ref Range   WBC 6.2 4.0 - 10.5 K/uL   RBC 4.40 3.87 - 5.11 MIL/uL   Hemoglobin 12.8 12.0 - 15.0 g/dL   HCT 59.7 41.6 - 38.4 %   MCV 89.5 80.0 - 100.0 fL   MCH 29.1 26.0 - 34.0 pg   MCHC 32.5 30.0 - 36.0 g/dL   RDW 53.6 46.8 - 03.2 %   Platelets 269 150 - 400 K/uL   nRBC 0.0 0.0 - 0.2 %   Neutrophils Relative % 70 %   Neutro Abs 4.3  1.7 - 7.7 K/uL   Lymphocytes Relative 20 %   Lymphs Abs 1.2 0.7 - 4.0 K/uL   Monocytes Relative 7 %   Monocytes Absolute 0.5 0.1 - 1.0 K/uL   Eosinophils Relative 2 %   Eosinophils Absolute 0.1 0.0 - 0.5 K/uL   Basophils Relative 1 %   Basophils Absolute 0.0 0.0 - 0.1 K/uL   Immature Granulocytes 0 %   Abs Immature Granulocytes 0.02 0.00 - 0.07 K/uL  Urinalysis, Routine w reflex microscopic  Result Value Ref Range   Color, Urine STRAW (A) YELLOW   APPearance CLEAR CLEAR   Specific Gravity, Urine 1.003 (L) 1.005 - 1.030   pH 7.0 5.0 - 8.0   Glucose, UA NEGATIVE NEGATIVE mg/dL   Hgb urine dipstick NEGATIVE NEGATIVE   Bilirubin Urine NEGATIVE NEGATIVE   Ketones, ur NEGATIVE NEGATIVE mg/dL   Protein, ur NEGATIVE NEGATIVE mg/dL   Nitrite NEGATIVE NEGATIVE   Leukocytes,Ua NEGATIVE NEGATIVE    Laboratory interpretation all normal except mild hypokalemia    EKG None  Radiology CT Abdomen Pelvis W Contrast  Result Date: 03/19/2020 CLINICAL DATA:  Nausea, vomiting and weakness. EXAM: CT ABDOMEN AND PELVIS WITH CONTRAST TECHNIQUE: Multidetector  CT imaging of the abdomen and pelvis was performed using the standard protocol following bolus administration of intravenous contrast. CONTRAST:  OMNIPAQUE IOHEXOL 300 MG/ML  SOLN COMPARISON:  None. FINDINGS: Lower chest: Marked pectus deformity noted with significant mass effect on the right ventricle. No pericardial effusion. The lung bases are clear. No pleural effusion. Hepatobiliary: No hepatic lesions or intrahepatic biliary dilatation. The gallbladder is normal. No common bile duct dilatation. Pancreas: No mass, inflammation or ductal dilatation. Spleen: Normal size. No focal lesions. Adrenals/Urinary Tract: Adrenal glands are normal. Small right renal cysts but no worrisome renal lesions, renal calculi or hydroureteronephrosis. The bladder is unremarkable. Stomach/Bowel: The stomach, duodenum, small bowel and colon are grossly normal  without oral contrast. No acute inflammatory changes, mass lesions or obstructive findings. The appendix is not identified for certain but I do not see any findings to suggest appendicitis. Vascular/Lymphatic: Advanced atherosclerotic calcifications involving the aorta but no aneurysm or dissection. The branch vessels are patent. No mesenteric or retroperitoneal mass or adenopathy. Reproductive: The uterus is surgically absent. The right ovary is still present and appears normal. I do not see the left ovary for certain. Other: No pelvic mass or adenopathy. No free pelvic fluid collections. No inguinal mass or adenopathy. No abdominal wall hernia or subcutaneous lesions. Musculoskeletal: No significant bony findings. IMPRESSION: 1. No acute abdominal/pelvic findings, mass lesions or adenopathy. 2. Advanced atherosclerotic calcifications involving the aorta but no aneurysm or dissection. 3. 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 Aortic Atherosclerosis (ICD10-I70.0). Electronically Signed   By: Rudie Meyer M.D.   On: 03/19/2020 05:24    Procedures Procedures (including critical care time)  Medications Ordered in ED Medications  meclizine (ANTIVERT) tablet 25 mg (25 mg Oral Given 03/19/20 0243)  ondansetron (ZOFRAN) tablet 4 mg (4 mg Oral Given 03/19/20 0243)  ondansetron (ZOFRAN) tablet 4 mg (4 mg Oral Given 03/19/20 0413)  iohexol (OMNIPAQUE) 300 MG/ML solution 100 mL (100 mLs Intravenous Contrast Given 03/19/20 0456)    ED Course  I have reviewed the triage vital signs and the nursing notes.  Pertinent labs & imaging results that were available during my care of the patient were reviewed by me and considered in my medical decision making (see chart for details).    MDM Rules/Calculators/A&P                      Lab work was done due to her complaints of persistent nausea and not eating well.  She was given meclizine and Zofran while in the ED.  She also  states she knows she has been drinking more but she also has been urinating more and wants a urinalysis checked.  Recheck at 4:35 AM patient states "I am worse".  When I asked her what that means she states she still nauseated.  She denies abdominal pain but she states it feels like "when you are hungry".  She states she still has lots of nausea.  We discussed getting a CT of her abdomen and she is agreeable.  Despite having the nausea and not being able to eat or drink patient states she has not lost weight with the 4 or 5 primary care visits she has had in the past week, she states she is always 146 pounds.  Patient's evaluation tonight is negative for the etiology of her nausea.  She has had a MRI of her brain recently that did not show cerebellar stroke.  She has  had some mild vertigo type symptoms with spinning sensation.  However she is having persistent nausea.  Her CT scan is unrevealing.  Patient will be discharged home with nausea medicine to follow-up with her primary care doctor.  Final Clinical Impression(s) / ED Diagnoses Final diagnoses:  Nausea  Vertigo    Rx / DC Orders ED Discharge Orders         Ordered    meclizine (ANTIVERT) 25 MG tablet  Every 6 hours PRN     03/19/20 0532    ondansetron (ZOFRAN) 4 MG tablet  Every 6 hours PRN     03/19/20 0532         Plan discharge  Devoria AlbeIva Kyerra Vargo, MD, Concha PyoFACEP    Mckinley Olheiser, MD 03/19/20 [id number]

## 2020-03-19 NOTE — Discharge Instructions (Addendum)
Your test tonight are unrevealing as to the etiology of your persistent nausea.  This is good news.  The fact that you have lost weight is also good news.  Take the medication as prescribed.  Please let Dr. Andrey Campanile know about your ED visit.

## 2020-03-24 ENCOUNTER — Encounter: Payer: Self-pay | Admitting: Gastroenterology

## 2020-03-31 ENCOUNTER — Telehealth: Payer: Self-pay

## 2020-03-31 NOTE — Telephone Encounter (Signed)
Pt called office and left a VM requesting a call back to schedule her new patient appt. She can be reached at [phone].

## 2020-04-02 ENCOUNTER — Ambulatory Visit (INDEPENDENT_AMBULATORY_CARE_PROVIDER_SITE_OTHER): Payer: Medicare Other | Admitting: Gastroenterology

## 2020-04-02 ENCOUNTER — Encounter: Payer: Self-pay | Admitting: Gastroenterology

## 2020-04-02 VITALS — BP 134/70 | HR 86 | Ht 63.0 in | Wt 145.0 lb

## 2020-04-02 DIAGNOSIS — R110 Nausea: Secondary | ICD-10-CM

## 2020-04-02 DIAGNOSIS — R200 Anesthesia of skin: Secondary | ICD-10-CM

## 2020-04-02 DIAGNOSIS — I447 Left bundle-branch block, unspecified: Secondary | ICD-10-CM | POA: Diagnosis not present

## 2020-04-02 DIAGNOSIS — R0789 Other chest pain: Secondary | ICD-10-CM

## 2020-04-02 DIAGNOSIS — R42 Dizziness and giddiness: Secondary | ICD-10-CM

## 2020-04-02 DIAGNOSIS — K219 Gastro-esophageal reflux disease without esophagitis: Secondary | ICD-10-CM

## 2020-04-02 MED ORDER — DEXLANSOPRAZOLE 60 MG PO CPDR
60.0000 mg | DELAYED_RELEASE_CAPSULE | Freq: Every day | ORAL | 0 refills | Status: DC
Start: 2020-04-02 — End: 2020-04-17

## 2020-04-02 MED ORDER — PROMETHAZINE HCL 12.5 MG PO TABS
12.5000 mg | ORAL_TABLET | Freq: Three times a day (TID) | ORAL | 1 refills | Status: DC | PRN
Start: 2020-04-02 — End: 2020-04-23

## 2020-04-02 NOTE — Progress Notes (Signed)
HPI :  74 year old female with history of GERD, hypertension, prior back surgeries, referred by Kathryne Eriksson, MD, for multiple complaints, main issue being chest discomfort.  The patient describes episodes of chest pain for the past month or so.  She tends to feel pain in her chest most of the time, the severity fluctuates.  Sometimes perhaps lying down makes a little bit better however it feels like a chronic soreness and heaviness.  She has had chest pains in the past, this appears more severe than she has had previously.  Eating does not worsen this at all, she does not have any odynophagia.  She denies any exertional symptoms, no shortness of breath.  Her husband passed this past February and she has been under a lot of stress since that happened.  At one point time she had some dysphagia but is not having much swallowing difficulty now.  She denies any regurgitation or typical reflux symptoms of heartburn or pyrosis.  She was given a course of Prevacid once daily and then twice daily which has not made any difference in her symptoms at all.  She was apparently seen in 2019 for chest pain and underwent a nuclear stress at that time which was negative.  She had an EKG done by her primary care with the symptoms on May 7 which showed a new left bundle branch block.  She was referred to the ER and had negative troponins at that time.  She has been referred to cardiology but has not seen them yet.  She was given some Carafate tablets and stated been hard to swallow so have been taking them too much.  She is not really sure if they have helped since she has been taking them.  Another main complaint she has is ongoing nausea.  She feels nauseated most of the time but she does not vomit much at all.  She eats pretty late at baseline.  She states she feels some " hunger pains" sometimes after eating but not frequently.  She took Zofran for 2 days for the nausea and states it did not help at all.  She had a CT  scan on May 20 which did not show any concerning pathology to cause her pain.  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  She does have advanced atherosclerotic calcifications of the aorta as well.  She has other ongoing symptoms that of been bothering her.  She has numbness in her extremities and her face which has come and gone.  She denies any weakness.  She also has ongoing dizziness and vertiginous symptoms but had a negative Dix-Hallpike and evaluation for vertigo by her primary care.  She has been referred to a neurologist for these issues for which they have tried contacting her but she has not made an appointment with them yet [facility]).  She states she was given an empiric course of doxycycline for possible tooth infection and took it for 4 days without improvement, she was then seen by her dentist who told her she had no problems with her teeth.  She takes hydrocodone chronically for chronic shingles pain.  She feels steady on her feet but she moves slowly.  She does not take any NSAIDs routinely.  She had an MRI done of her brain on May 7 which was prematurely terminated.  No evidence of any acute infarct on that exam.  She reports having an EGD and colonoscopy in  2015, do not have results on file.  CT abdomen / pelvis - 03/19/20 - IMPRESSION: 1. No acute abdominal/pelvic findings, mass lesions or adenopathy. 2. Advanced atherosclerotic calcifications involving the aorta but no aneurysm or dissection. 3. 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  Past Medical History:  Diagnosis Date  . Allergy   . GERD (gastroesophageal reflux disease)   . Herpes zoster without complications   . Hypertension      Past Surgical History:  Procedure Laterality Date  . ABDOMINAL HYSTERECTOMY  02/1984  . BACK SURGERY    . LUMBAR LAMINECTOMY     Family History  Problem Relation Age of Onset    . Cancer Mother   . Leukemia Mother   . Lung cancer Father   . Multiple sclerosis Sister   . Alcohol abuse Brother   . Multiple myeloma Brother    Social History   Tobacco Use  . Smoking status: Never Smoker  . Smokeless tobacco: Never Used  Substance Use Topics  . Alcohol use: No  . Drug use: No   Current Outpatient Medications  Medication Sig Dispense Refill  . aspirin 81 MG chewable tablet Chew 81 mg by mouth at bedtime as needed (for chest discomfort).     Marland Kitchen aspirin-acetaminophen-caffeine (EXCEDRIN MIGRAINE) 250-250-65 MG tablet Take 1 tablet by mouth every 6 (six) hours as needed for headache (or pain).    Marland Kitchen gabapentin (NEURONTIN) 100 MG capsule Take 100 mg by mouth at bedtime as needed (for restless legs).     . Multiple Vitamin (MULTIVITAMIN) tablet Take 1 tablet by mouth daily.    . NON FORMULARY Take 1 capsule by mouth See admin instructions. Probiotic: Flora Balance O'Donnell Formula Capsules: Take 1 capsule by mouth in the morning    . zolpidem (AMBIEN) 10 MG tablet Take 10 mg by mouth at bedtime.     Marland Kitchen amLODipine (NORVASC) 5 MG tablet Take 1 tablet (5 mg total) by mouth daily. (Patient not taking: Reported on 04/02/2020) 30 tablet 0  . dexlansoprazole (DEXILANT) 60 MG capsule Take 1 capsule (60 mg total) by mouth daily. 20 capsule 0  . promethazine (PHENERGAN) 12.5 MG tablet Take 1 tablet (12.5 mg total) by mouth every 8 (eight) hours as needed for nausea or vomiting. 30 tablet 1  . sucralfate (CARAFATE) 1 g tablet Take 1 tablet (1 g total) by mouth every 6 (six) hours as needed. Slowly dissolve tablet in 1 Tablespoon of distilled water before ingesting 90 tablet 0   Current Facility-Administered Medications  Medication Dose Route Frequency Provider Last Rate Last Admin  . gi cocktail (Maalox,Lidocaine,Donnatal)  30 mL Oral Once Copland, Gay Filler, MD       Allergies  Allergen Reactions  . Naproxen Sodium Anxiety     Review of Systems: All systems reviewed and  negative except where noted in HPI.    DG Chest 2 View  Result Date: 03/06/2020 CLINICAL DATA:  Chest pain and dizziness since last evening. EXAM: CHEST - 2 VIEW COMPARISON:  03/30/2012 FINDINGS: The cardiac silhouette, mediastinal and hilar contours are within normal limits given the significant pectus deformity. Prominent infrahilar vascular markings on the right side are typical. No infiltrates, edema or effusions. No pneumothorax. The bony thorax is intact. IMPRESSION: 1. Significant pectus deformity. 2. No acute cardiopulmonary findings. Electronically Signed   By: Marijo Sanes M.D.   On: 03/06/2020 12:47   MR BRAIN WO CONTRAST  Result Date: 03/06/2020 CLINICAL DATA:  Vertigo,  peripheral. Additional history provided: central chest pain and dizziness which began last night, pain continued throughout the night and worsened this morning, blood pressure with systolic in the 779'T. EXAM: MRI HEAD WITHOUT CONTRAST TECHNIQUE: Multiplanar, multiecho pulse sequences of the brain and surrounding structures were obtained without intravenous contrast. COMPARISON:  No pertinent prior studies available for comparison. FINDINGS: Brain: The patient was unable to tolerate the full examination. Only an axial diffusion-weighted sequence, coronal diffusion-weighted sequence and sagittal T1 weighted sequence could be obtained. The sagittal T1 weighted sequence is severely motion degraded. There is no evidence of acute infarct. Vascular: Poorly assessed on the acquired sequences. Skull and upper cervical spine: No focal marrow lesion is identified within described limitations. Sinuses/Orbits: Poorly assessed on the acquired sequences. IMPRESSION: Prematurely terminated examination only consisting of diffusion-weighted imaging and a severely motion degraded sagittal T1-weighted sequence. No evidence of acute infarct. Electronically Signed   By: Kellie Simmering DO   On: 03/06/2020 18:54   CT Abdomen Pelvis W Contrast  Result  Date: 03/19/2020 CLINICAL DATA:  Nausea, vomiting and weakness. EXAM: CT ABDOMEN AND PELVIS WITH CONTRAST TECHNIQUE: Multidetector CT imaging of the abdomen and pelvis was performed using the standard protocol following bolus administration of intravenous contrast. CONTRAST:  128m OMNIPAQUE IOHEXOL 300 MG/ML  SOLN COMPARISON:  None. FINDINGS: Lower chest: Marked pectus deformity noted with significant mass effect on the right ventricle. No pericardial effusion. The lung bases are clear. No pleural effusion. Hepatobiliary: No hepatic lesions or intrahepatic biliary dilatation. The gallbladder is normal. No common bile duct dilatation. Pancreas: No mass, inflammation or ductal dilatation. Spleen: Normal size. No focal lesions. Adrenals/Urinary Tract: Adrenal glands are normal. Small right renal cysts but no worrisome renal lesions, renal calculi or hydroureteronephrosis. The bladder is unremarkable. Stomach/Bowel: The stomach, duodenum, small bowel and colon are grossly normal without oral contrast. No acute inflammatory changes, mass lesions or obstructive findings. The appendix is not identified for certain but I do not see any findings to suggest appendicitis. Vascular/Lymphatic: Advanced atherosclerotic calcifications involving the aorta but no aneurysm or dissection. The branch vessels are patent. No mesenteric or retroperitoneal mass or adenopathy. Reproductive: The uterus is surgically absent. The right ovary is still present and appears normal. I do not see the left ovary for certain. Other: No pelvic mass or adenopathy. No free pelvic fluid collections. No inguinal mass or adenopathy. No abdominal wall hernia or subcutaneous lesions. Musculoskeletal: No significant bony findings. IMPRESSION: 1. No acute abdominal/pelvic findings, mass lesions or adenopathy. 2. Advanced atherosclerotic calcifications involving the aorta but no aneurysm or dissection. 3. Marked pectus deformity with significant mass effect on  the right ventricle. Aortic Atherosclerosis (ICD10-I70.0). Electronically Signed   By: PMarijo SanesM.D.   On: 03/19/2020 05:24    Physical Exam: BP 134/70   Pulse 86   Ht '5\' 3"'  (1.6 m)   Wt 145 lb (65.8 kg)   SpO2 98%   BMI 25.69 kg/m  Constitutional: Pleasant, female in no acute distress. HEENT: Normocephalic and atraumatic. Conjunctivae are normal. No scleral icterus. Neck supple.  Cardiovascular: Normal rate, regular rhythm. Pectus excavatum Pulmonary/chest: Effort normal and breath sounds normal. No wheezing, rales or rhonchi. Abdominal: Soft, nondistended, nontender.  There are no masses palpable.  Extremities: no edema Lymphadenopathy: No cervical adenopathy noted. Neurological: Alert and oriented to person place and time. Skin: Skin is warm and dry. No rashes noted. Psychiatric: Normal mood and affect. Behavior is normal.   ASSESSMENT AND PLAN: 74year old female here for  new patient assessment of the following:  Atypical chest pain - unclear what is driving this, ongoing for a few weeks now. GERD on ddx however she has not responded to twice daily PPI or Carafate thus I really question whether or not reflux is contributing to this at all.  She does have a new left bundle branch block and some compression of her right ventricle on the CT scan recently, she has a cardiology consult pending for this and agree with pursuing that, although seen in the ED with chest pain and troponins have been negative.  While we await her cardiology evaluation we will stop the Prevacid and give her a trial of Dexilant 60 mg once daily just to see if that makes any difference, and switch her Carafate to a liquid slurry to drink when she gets the symptoms and see if that helps.  I also offered her a barium swallow with tablet to assess her esophageal motility and her prior symptoms of dysphagia.  Pending her cardiac evaluation and barium study, intercourse on Dexilant, we may consider an EGD.  She  agreed  Nausea - unclear what is driving this.  She has a lot of dizziness/vertiginous symptoms pending neurology evaluation this could definitely be related.  She did not have benefit with Zofran, will give her low-dose Phenergan to use as needed and see if that helps.  I cautioned her on side effects of Phenergan such as drowsiness etc.  Reassured her I do not see anything too concerning on the CT scan otherwise in regards to her pancreas or GI tract.  As above, may consider EGD  Numbness / Dizziness -have tried to assist her in scheduling appointment with neurology as she was previously referred, will await that evaluation.  [place] Cellar, MD [facility]  CC: Christain Sacramento, MD

## 2020-04-02 NOTE — Patient Instructions (Addendum)
If you are age 74 or older, your body mass index should be between 23-30. Your Body mass index is 25.69 kg/m. If this is out of the aforementioned range listed, please consider follow up with your Primary Care Provider.  If you are age 57 or younger, your body mass index should be between 19-25. Your Body mass index is 25.69 kg/m. If this is out of the aformentioned range listed, please consider follow up with your Primary Care Provider.    You have been scheduled for a Barium Esophogram at [facility] (1st floor of the hospital) on Tuesday, 04-07-20 at 11:00am. Please arrive 15 minutes prior to your appointment for registration. Make certain not to have anything to eat or drink 3 hours prior to your test. If you need to reschedule for any reason, please contact radiology at [phone] to do so. __________________________________________________________________ A barium swallow is an examination that concentrates on views of the esophagus. This tends to be a double contrast exam (barium and two liquids which, when combined, create a gas to distend the wall of the oesophagus) or single contrast (non-ionic iodine based). The study is usually tailored to your symptoms so a good history is essential. Attention is paid during the study to the form, structure and configuration of the esophagus, looking for functional disorders (such as aspiration, dysphagia, achalasia, motility and reflux) EXAMINATION You may be asked to change into a gown, depending on the type of swallow being performed. A radiologist and radiographer will perform the procedure. The radiologist will advise you of the type of contrast selected for your procedure and direct you during the exam. You will be asked to stand, sit or lie in several different positions and to hold a small amount of fluid in your mouth before being asked to swallow while the imaging is performed .In some instances you may be asked to swallow barium coated  marshmallows to assess the motility of a solid food bolus. The exam can be recorded as a digital or video fluoroscopy procedure. POST PROCEDURE It will take 1-2 days for the barium to pass through your system. To facilitate this, it is important, unless otherwise directed, to increase your fluids for the next 24-48hrs and to resume your normal diet.  This test typically takes about 30 minutes to perform. _____________________________________________________________________  We have sent the following medications to your pharmacy for you to pick up at your convenience: Phenergan 12.5 mg: Take every 8 hours as needed for nausea  We have given you samples of the following medication to take: Dexilant 60mg : Take once daily Discontinue Prevacid  To make a slurry with the Carafate tablets:  Slowly dissolve 1 tablet in 1 Tablespoon of distilled water before ingesting.  We have referred you to [facility].  They will reach out to you to schedule an appointment.   You have an appointment with [facility] on July 19th at 11:30am with Dr. July 21 . Please arrive at 11:00am. They are located at [address] in [place], [place].  Their number is [phone].

## 2020-04-06 ENCOUNTER — Telehealth: Payer: Self-pay

## 2020-04-06 NOTE — Telephone Encounter (Signed)
Prior authorization denied, message sent to Dr. Lavon Paganini

## 2020-04-06 NOTE — Telephone Encounter (Signed)
I had to send a prior authorization for Promethazine for this patient ( I think Zofran did not work) and it was denied.  Any suggestions?

## 2020-04-06 NOTE — Telephone Encounter (Signed)
Prior authorization for Phenergan sent to Cover My Meds; awaiting response

## 2020-04-06 NOTE — Telephone Encounter (Signed)
I had to do a prior authorization on the Promethazine you prescribed (I believe Zofran did not work) and it was denied.  Any suggestions?

## 2020-04-06 NOTE — Telephone Encounter (Signed)
That is very unusual. She has failed Zofran. Not sure how much this costs as it is an older drug, maybe she can purchase it from the pharmacy if not too expensive? If not then let me know we will discuss other options.

## 2020-04-07 ENCOUNTER — Telehealth: Payer: Self-pay | Admitting: Gastroenterology

## 2020-04-07 ENCOUNTER — Ambulatory Visit (HOSPITAL_COMMUNITY)
Admission: RE | Admit: 2020-04-07 | Discharge: 2020-04-07 | Disposition: A | Discharge status: Home / Self Care | Payer: Medicare Other | Source: Ambulatory Visit | Attending: Gastroenterology | Admitting: Gastroenterology

## 2020-04-07 ENCOUNTER — Other Ambulatory Visit: Payer: Self-pay

## 2020-04-07 DIAGNOSIS — R0789 Other chest pain: Secondary | ICD-10-CM | POA: Insufficient documentation

## 2020-04-07 DIAGNOSIS — R200 Anesthesia of skin: Secondary | ICD-10-CM | POA: Diagnosis present

## 2020-04-07 DIAGNOSIS — R110 Nausea: Secondary | ICD-10-CM | POA: Insufficient documentation

## 2020-04-07 DIAGNOSIS — I447 Left bundle-branch block, unspecified: Secondary | ICD-10-CM

## 2020-04-07 DIAGNOSIS — K219 Gastro-esophageal reflux disease without esophagitis: Secondary | ICD-10-CM

## 2020-04-07 NOTE — Telephone Encounter (Signed)
Results review of prior records:  EGD 03/03/2014 - Dr. Ewing Schlein - small HH, patchy gastritis, normal duodenum  Colonoscopy 03/03/2014 - sigmoid diverticulosis, small hemorrhoids, normal ileum, otherwise normal exam - adequate prep. No further screening recommended given age   Continue plan as outlined in the clinic visit recently.

## 2020-04-07 NOTE — Telephone Encounter (Signed)
Marily Memos from [employer] stated that PA for promethazine was denied due to "non-FDA approved use."

## 2020-04-08 ENCOUNTER — Ambulatory Visit: Payer: Medicare Other | Admitting: Cardiovascular Disease

## 2020-04-08 ENCOUNTER — Encounter: Payer: Self-pay | Admitting: Cardiovascular Disease

## 2020-04-08 VITALS — BP 156/96 | HR 70 | Ht 63.0 in | Wt 148.0 lb

## 2020-04-08 DIAGNOSIS — I10 Essential (primary) hypertension: Secondary | ICD-10-CM | POA: Diagnosis not present

## 2020-04-08 DIAGNOSIS — R0789 Other chest pain: Secondary | ICD-10-CM | POA: Diagnosis not present

## 2020-04-08 NOTE — Patient Instructions (Signed)
Medication Instructions:  The current medical regimen is effective;  continue present plan and medications.  *If you need a refill on your cardiac medications before your next appointment, please call your pharmacy*   Testing/Procedures: Your physician has requested that you have a lexiscan myoview. A cardiac stress test is a cardiological test that measures the heart's ability to respond to external stress in a controlled clinical environment. The stress response is induced by intravenous pharmacological stimulation.    Follow-Up: At [facility], you and your health needs are our priority.  As part of our continuing mission to provide you with exceptional heart care, we have created designated Provider Care Teams.  These Care Teams include your primary Cardiologist (physician) and Advanced Practice Providers (APPs -  Physician Assistants and Nurse Practitioners) who all work together to provide you with the care you need, when you need it.  We recommend signing up for the patient portal called "MyChart".  Sign up information is provided on this After Visit Summary.  MyChart is used to connect with patients for Virtual Visits (Telemedicine).  Patients are able to view lab/test results, encounter notes, upcoming appointments, etc.  Non-urgent messages can be sent to your provider as well.   To learn more about what you can do with MyChart, go to [email or web address].    Your next appointment:   As needed  The format for your next appointment:   In Person  Provider:   Jonathan Berry, MD     

## 2020-04-08 NOTE — Telephone Encounter (Signed)
Called pharmacy and confirmed that patient already picked up promethazine, which was affordable.

## 2020-04-08 NOTE — Assessment & Plan Note (Signed)
Kayla Terry was referred to me for atypical chest pain she is.  She was seen by Dr. Elease Hashimoto back in 2013 with similar symptoms and a negative Myoview stress test.  Risk factors are conspicuously absent.  She has never had a heart catheter stroke.  There is no family history.  Her pain is fairly constant.  Milligram IV stress test to risk stratify her.

## 2020-04-08 NOTE — Assessment & Plan Note (Signed)
Recent history of essential pretension on amlodipine.  Her PCP stopped her medication because her blood pressures were under good control.

## 2020-04-08 NOTE — Progress Notes (Signed)
04/08/2020 Kayla Terry   12-Sep-1946  [id number]  Primary Physician Christain Sacramento, MD Primary Cardiologist: Lorretta Harp MD [name], [place]  HPI:  Kayla Terry is a 74 y.o. moderately overweight widowed Caucasian female mother 51, grandmother 4 grandchildren referred to me by Dr. Redmond Pulling for atypical chest pain.  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  She is never smoked.  She has no history of diabetes or high cholesterol.  She recently was diagnosed with hypertension and placed on amlodipine but this was discontinued.  There is no family history of heart disease.  She never had heart attack or stroke.  She had chest pain beginning 1 May.  Pain is constant.  She was evaluated by Dr. Acie Fredrickson back in 2013 with a negative Myoview stress test.   Current Meds  Medication Sig  . aspirin 81 MG chewable tablet Chew 81 mg by mouth at bedtime as needed (for chest discomfort).   Marland Kitchen aspirin-acetaminophen-caffeine (EXCEDRIN MIGRAINE) 250-250-65 MG tablet Take 1 tablet by mouth every 6 (six) hours as needed for headache (or pain).  Marland Kitchen dexlansoprazole (DEXILANT) 60 MG capsule Take 1 capsule (60 mg total) by mouth daily.  Marland Kitchen gabapentin (NEURONTIN) 100 MG capsule Take 100 mg by mouth at bedtime as needed (for restless legs).   Marland Kitchen HYDROcodone-acetaminophen (NORCO/VICODIN) 5-325 MG tablet Take 1 tablet by mouth every 6 (six) hours as needed for moderate pain.  . Multiple Vitamin (MULTIVITAMIN) tablet Take 1 tablet by mouth daily.  . NON FORMULARY Take 1 capsule by mouth See admin instructions. Probiotic: Flora Balance O'Donnell Formula Capsules: Take 1 capsule by mouth in the morning  . promethazine (PHENERGAN) 12.5 MG tablet Take 1 tablet (12.5 mg total) by mouth every 8 (eight) hours as needed for nausea or vomiting.  Marland Kitchen zolpidem (AMBIEN) 10 MG tablet Take 10 mg by mouth at bedtime.    Current Facility-Administered Medications for the 04/08/20 encounter (Office Visit) with Lorretta Harp, MD  Medication  . gi cocktail (Maalox,Lidocaine,Donnatal)     Allergies  Allergen Reactions  . Naproxen Sodium Anxiety    Social History   Socioeconomic History  . Marital status: Married    Spouse name: Not on file  . Number of children: Not on file  . Years of education: Not on file  . Highest education level: Not on file  Occupational History  . Not on file  Tobacco Use  . Smoking status: Never Smoker  . Smokeless tobacco: Never Used  Substance and Sexual Activity  . Alcohol use: No  . Drug use: No  . Sexual activity: Not on file  Other Topics Concern  . Not on file  Social History Narrative  . Not on file   Social Determinants of Health   Financial Resource Strain:   . Difficulty of Paying Living Expenses:   Food Insecurity:   . Worried About Charity fundraiser in the Last Year:   . Arboriculturist in the Last Year:   Transportation Needs:   . Film/video editor (Medical):   Marland Kitchen Lack of Transportation (Non-Medical):   Physical Activity:   . Days of Exercise per Week:   . Minutes of Exercise per Session:   Stress:   . Feeling of Stress :   Social Connections:   . Frequency of Communication with Friends and Family:   . Frequency of Social Gatherings with Friends and Family:   . Attends Religious Services:   .  Active Member of Clubs or Organizations:   . Attends Banker Meetings:   Marland Kitchen Marital Status:   Intimate Partner Violence:   . Fear of Current or Ex-Partner:   . Emotionally Abused:   Marland Kitchen Physically Abused:   . Sexually Abused:      Review of Systems: General: negative for chills, fever, night sweats or weight changes.  Cardiovascular: negative for chest pain, dyspnea on exertion, edema, orthopnea, palpitations, paroxysmal nocturnal dyspnea or shortness of breath Dermatological: negative for rash Respiratory: negative for cough or wheezing Urologic: negative for hematuria Abdominal: negative for nausea, vomiting,  diarrhea, bright red blood per rectum, melena, or hematemesis Neurologic: negative for visual changes, syncope, or dizziness All other systems reviewed and are otherwise negative except as noted above.    Blood pressure (!) 156/96, pulse 70, height 5\' 3"  (1.6 m), weight 148 lb (67.1 kg), SpO2 98 %.  General appearance: alert and no distress Neck: no adenopathy, no carotid bruit, no JVD, supple, symmetrical, trachea midline and thyroid not enlarged, symmetric, no tenderness/mass/nodules Lungs: clear to auscultation bilaterally Heart: regular rate and rhythm, S1, S2 normal, no murmur, click, rub or gallop Extremities: extremities normal, atraumatic, no cyanosis or edema Pulses: 2+ and symmetric Skin: Skin color, texture, turgor normal. No rashes or lesions Neurologic: Alert and oriented X 3, normal strength and tone. Normal symmetric reflexes. Normal coordination and gait  EKG sinus rhythm at 70 with septal Q waves and nonspecific IVCD with left axis deviation.  Personally reviewed this EKG.  ASSESSMENT AND PLAN:   Chest tightness Ms. Ditmars was referred to me for atypical chest pain she is.  She was seen by Dr. Lovell Sheehan back in 2013 with similar symptoms and a negative Myoview stress test.  Risk factors are conspicuously absent.  She has never had a heart catheter stroke.  There is no family history.  Her pain is fairly constant.  Milligram IV stress test to risk stratify her.  Benign essential hypertension Recent history of essential pretension on amlodipine.  Her PCP stopped her medication because her blood pressures were under good control.      2014 MD FACP,FACC,FAHA, [facility] 04/08/2020 2:56 PM

## 2020-04-09 ENCOUNTER — Telehealth: Payer: Self-pay | Admitting: Gastroenterology

## 2020-04-09 NOTE — Telephone Encounter (Signed)
Patient is calling she takes Dexilant once daily but today she is in a lot of pain and would like to know if there is something else she can take

## 2020-04-10 ENCOUNTER — Telehealth: Payer: Self-pay | Admitting: Gastroenterology

## 2020-04-10 NOTE — Telephone Encounter (Signed)
Patient notified to take OTC heartburn medications for breakthrough ie. Maalox, TUMS, pepcid etc.Patient instructed to maintain an anti-reflux diet. Advised to avoid caffeine, mint, citrus foods/juices, tomatoes,  chocolate, NSAIDS/ASA products.  Instructed not to eat within 2 hours of exercise or bed, multiple small meals are better than 3 large meals.  Need to take PPI 30 minutes prior to 1st meal of the day.  Dr. Adela Lank do you have any additional recommendations. She continues to c/o nausea and using phenergan on a regular basis.

## 2020-04-10 NOTE — Telephone Encounter (Signed)
Patient notified of recommendations. 

## 2020-04-10 NOTE — Telephone Encounter (Signed)
Pt called again, stating that Dexilant has been stopped working few hours after taking it. She stated that she took her dose at 7:30am this morning and is already wearing off, Pt is already feeling soreness between her breast and bra, She states that sxs have been interfering with her sleep.

## 2020-04-10 NOTE — Telephone Encounter (Signed)
Sorry to hear this. She has not responded to max dose PPI, carafate, and antiemetics, so as I have discussed with her in clinic it is not clear to me that her symptoms are related to reflux. She has seen cardiology and they have recommended a nuclear stress test, to be done in the next week or so, and will see what that shows. Her barium study did not show any obvious abnormality. Agree with your recommendations for reflux, she can continue Dexilant, carafate, antiemetics, PRN. We can proceed with EGD if her stress test is okay. Also she should be following up with neurology for her dizziness, and not sure if that is causing / contributing to her nausea.

## 2020-04-13 ENCOUNTER — Telehealth (HOSPITAL_COMMUNITY): Payer: Self-pay

## 2020-04-13 ENCOUNTER — Telehealth: Payer: Self-pay | Admitting: Gastroenterology

## 2020-04-13 NOTE — Telephone Encounter (Signed)
Patient is encouraged to contact neurology back for an earlier appt. I offered to send a referral to another practice for her , but not sure that they will be able to see her before her appt on 7/19.  She wants to schedule EGD now and not await cardiac evaluation.  We discussed the importance in ruling out a cardiac cause of her chest pain prior to anesthesia.  She will continue her medications. And call us back once she has had the stress test to set up EGD

## 2020-04-13 NOTE — Telephone Encounter (Signed)
Patients daughter is calling, she states that we sent a referral to neurology, and has an appointment in July. and they are trying to get an appointment earlier but now they wont answer the phone. States she has called over 10 times and the phone doesn't go through. She is asking to speak with you about how to get a sooner appointment.

## 2020-04-13 NOTE — Telephone Encounter (Signed)
Spoke with the patient, detailed instructions given. Patient stated that she understood and would be here for her test. Asked to call back with any questions. S.Shavell Nored EMTP

## 2020-04-14 ENCOUNTER — Ambulatory Visit (HOSPITAL_COMMUNITY): Payer: Medicare Other | Attending: Cardiology

## 2020-04-14 ENCOUNTER — Other Ambulatory Visit: Payer: Self-pay

## 2020-04-14 VITALS — Ht 63.0 in | Wt 148.0 lb

## 2020-04-14 DIAGNOSIS — R0789 Other chest pain: Secondary | ICD-10-CM

## 2020-04-14 DIAGNOSIS — R110 Nausea: Secondary | ICD-10-CM

## 2020-04-14 DIAGNOSIS — E7849 Other hyperlipidemia: Secondary | ICD-10-CM | POA: Diagnosis present

## 2020-04-14 LAB — MYOCARDIAL PERFUSION IMAGING
LV dias vol: 45 mL (ref 46–106)
LV sys vol: 18 mL
Peak HR: 112 {beats}/min
Rest HR: 71 {beats}/min
SDS: 2
SRS: 6
SSS: 9
TID: 0.97

## 2020-04-14 MED ORDER — REGADENOSON 0.4 MG/5ML IV SOLN
0.4000 mg | Freq: Once | INTRAVENOUS | Status: AC
  Administered 2020-04-14: 0.4 mg via INTRAVENOUS

## 2020-04-14 MED ORDER — TECHNETIUM TC 99M TETROFOSMIN IV KIT
31.3000 | PACK | Freq: Once | INTRAVENOUS | Status: AC | PRN
  Administered 2020-04-14: 31.3 via INTRAVENOUS
  Filled 2020-04-14: qty 32

## 2020-04-14 MED ORDER — AMINOPHYLLINE 25 MG/ML IV SOLN
75.0000 mg | Freq: Once | INTRAVENOUS | Status: AC
  Administered 2020-04-14: 75 mg via INTRAVENOUS

## 2020-04-14 MED ORDER — TECHNETIUM TC 99M TETROFOSMIN IV KIT
10.9000 | PACK | Freq: Once | INTRAVENOUS | Status: AC | PRN
  Administered 2020-04-14: 10.9 via INTRAVENOUS
  Filled 2020-04-14: qty 11

## 2020-04-15 ENCOUNTER — Emergency Department (HOSPITAL_COMMUNITY): Payer: Medicare Other

## 2020-04-15 ENCOUNTER — Emergency Department (HOSPITAL_COMMUNITY)
Admission: EM | Admit: 2020-04-15 | Discharge: 2020-04-15 | Disposition: A | Discharge status: Home / Self Care | Payer: Medicare Other | Attending: Emergency Medicine | Admitting: Emergency Medicine

## 2020-04-15 ENCOUNTER — Encounter (HOSPITAL_COMMUNITY): Payer: Self-pay

## 2020-04-15 ENCOUNTER — Other Ambulatory Visit: Payer: Self-pay

## 2020-04-15 ENCOUNTER — Telehealth: Payer: Self-pay | Admitting: Cardiovascular Disease

## 2020-04-15 DIAGNOSIS — R531 Weakness: Secondary | ICD-10-CM | POA: Diagnosis not present

## 2020-04-15 DIAGNOSIS — R110 Nausea: Secondary | ICD-10-CM | POA: Insufficient documentation

## 2020-04-15 DIAGNOSIS — R5383 Other fatigue: Secondary | ICD-10-CM | POA: Insufficient documentation

## 2020-04-15 DIAGNOSIS — Z20822 Contact with and (suspected) exposure to covid-19: Secondary | ICD-10-CM | POA: Diagnosis not present

## 2020-04-15 DIAGNOSIS — R0789 Other chest pain: Secondary | ICD-10-CM | POA: Diagnosis not present

## 2020-04-15 DIAGNOSIS — R630 Anorexia: Secondary | ICD-10-CM | POA: Diagnosis not present

## 2020-04-15 DIAGNOSIS — I10 Essential (primary) hypertension: Secondary | ICD-10-CM | POA: Diagnosis not present

## 2020-04-15 DIAGNOSIS — Z7982 Long term (current) use of aspirin: Secondary | ICD-10-CM | POA: Insufficient documentation

## 2020-04-15 DIAGNOSIS — Z79899 Other long term (current) drug therapy: Secondary | ICD-10-CM | POA: Diagnosis not present

## 2020-04-15 LAB — HEPATIC FUNCTION PANEL
ALT: 16 U/L (ref 0–44)
AST: 19 U/L (ref 15–41)
Albumin: 4.2 g/dL (ref 3.5–5.0)
Alkaline Phosphatase: 81 U/L (ref 38–126)
Bilirubin, Direct: <0.1 mg/dL (ref 0.0–0.2)
Total Bilirubin: 0.3 mg/dL (ref 0.3–1.2)
Total Protein: 7.4 g/dL (ref 6.5–8.1)

## 2020-04-15 LAB — TROPONIN I (HIGH SENSITIVITY)
Troponin I (High Sensitivity): 3 ng/L (ref <18)
Troponin I (High Sensitivity): 3 ng/L (ref <18)

## 2020-04-15 LAB — BASIC METABOLIC PANEL
Anion gap: 9 (ref 5–15)
BUN: 10 mg/dL (ref 8–23)
CO2: 26 mmol/L (ref 22–32)
Calcium: 9.6 mg/dL (ref 8.9–10.3)
Chloride: 104 mmol/L (ref 98–111)
Creatinine, Ser: 0.79 mg/dL (ref 0.44–1.00)
GFR calc Af Amer: >60 mL/min (ref >60)
GFR calc non Af Amer: >60 mL/min (ref >60)
Glucose, Bld: 108 mg/dL — ABNORMAL HIGH (ref 70–99)
Potassium: 4 mmol/L (ref 3.5–5.1)
Sodium: 139 mmol/L (ref 135–145)

## 2020-04-15 LAB — CBC
HCT: 41.2 % (ref 36.0–46.0)
Hemoglobin: 13.2 g/dL (ref 12.0–15.0)
MCH: 28.6 pg (ref 26.0–34.0)
MCHC: 32 g/dL (ref 30.0–36.0)
MCV: 89.4 fL (ref 80.0–100.0)
Platelets: 281 10*3/uL (ref 150–400)
RBC: 4.61 MIL/uL (ref 3.87–5.11)
RDW: 13.4 % (ref 11.5–15.5)
WBC: 7.5 10*3/uL (ref 4.0–10.5)
nRBC: 0 % (ref 0.0–0.2)

## 2020-04-15 LAB — SARS CORONAVIRUS 2 BY RT PCR (HOSPITAL ORDER, PERFORMED IN ~~LOC~~ HOSPITAL LAB): SARS Coronavirus 2: NEGATIVE

## 2020-04-15 LAB — ETHANOL: Alcohol, Ethyl (B): <10 mg/dL (ref <10)

## 2020-04-15 LAB — LIPASE, BLOOD: Lipase: 27 U/L (ref 11–51)

## 2020-04-15 LAB — D-DIMER, QUANTITATIVE: D-Dimer, Quant: 0.31 ug/mL-FEU (ref 0.00–0.50)

## 2020-04-15 MED ORDER — SUCRALFATE 1 G PO TABS
1.0000 g | ORAL_TABLET | Freq: Three times a day (TID) | ORAL | 0 refills | Status: DC
Start: 2020-04-15 — End: 2020-04-23

## 2020-04-15 MED ORDER — SODIUM CHLORIDE 0.9% FLUSH
3.0000 mL | Freq: Once | INTRAVENOUS | Status: AC
  Administered 2020-04-15: 3 mL via INTRAVENOUS

## 2020-04-15 MED ORDER — SODIUM CHLORIDE (PF) 0.9 % IJ SOLN
INTRAMUSCULAR | Status: AC
  Filled 2020-04-15: qty 50

## 2020-04-15 MED ORDER — IOHEXOL 300 MG/ML  SOLN
75.0000 mL | Freq: Once | INTRAMUSCULAR | Status: AC | PRN
  Administered 2020-04-15: 75 mL via INTRAVENOUS

## 2020-04-15 NOTE — ED Triage Notes (Signed)
Patient c/o central chest pain that started May 7th and has not went away. Patient reports she has been on several medications with no rleif.   Patient states she has had MRI, CT, and barium test.   Patient reports she just had a stress test yesterday.    A/ox4 Ambulatory in triage.

## 2020-04-15 NOTE — Telephone Encounter (Signed)
Please review stress test.  Okay to schedule the EGD?  See her question about doxycline

## 2020-04-15 NOTE — Telephone Encounter (Signed)
The patient has been notified of the stress test result and verbalized understanding.  All questions (if any) were answered. Leanord Hawking, RN 04/15/2020 8:48 AM

## 2020-04-15 NOTE — Telephone Encounter (Signed)
Patient is calling to follow up on previous call states she did a stress test and is able to proceed with scheduling EGD and also has question on a medication Dexilant and promethazine. She is saying she gets a running nose from it and would like to know if she can take Doxycycline. States since her barium test her symptoms have changed and she would like to discuss.

## 2020-04-15 NOTE — ED Provider Notes (Signed)
[facility] DEPT Provider Note   CSN: [id number] Arrival date & time: 04/15/20  1106     History Chief Complaint  Patient presents with  . Chest Pain    Kayla Terry is a 74 y.o. female.  HPI     Patient presents with concern of ongoing weakness, chest pain, anorexia, nausea. Patient notes that she was well until May 6.  She specifically identifies that date is a change from baseline.  She notes that at that time she developed chest pain.  In the interval she has been seen and evaluated at the emergency department, primary care, gastroenterology, and had a stress test yesterday. She notes in spite of all these interventions, in spite of recent course of doxycycline, she has persistent generalized weakness without focality, pressure in her sternal area described as pressure.  Associated anorexia, nausea, fatigue.  No fever, no vomiting, no apparent dyspnea.  Patient has in the interval been diagnosed with hypertension, and had 1 course of doxycycline as well.  Past Medical History:  Diagnosis Date  . Allergy   . GERD (gastroesophageal reflux disease)   . Herpes zoster without complications   . Hypertension     Patient Active Problem List   Diagnosis Date Noted  . Benign essential hypertension 06/08/2017  . Acid reflux disease 03/08/2016  . Hyperlipidemia 03/08/2016  . Nocturia 03/08/2016  . Restless leg syndrome 03/08/2016  . Chest tightness 08/21/2012    Past Surgical History:  Procedure Laterality Date  . ABDOMINAL HYSTERECTOMY  02/1984  . BACK SURGERY    . LUMBAR LAMINECTOMY       OB History   No obstetric history on file.     Family History  Problem Relation Age of Onset  . Cancer Mother   . Leukemia Mother   . Lung cancer Father   . Multiple sclerosis Sister   . Alcohol abuse Brother   . Multiple myeloma Brother     Social History   Tobacco Use  . Smoking status: Never Smoker  . Smokeless tobacco: Never Used    Vaping Use  . Vaping Use: Never used  Substance Use Topics  . Alcohol use: No  . Drug use: No    Home Medications Prior to Admission medications   Medication Sig Start Date End Date Taking? Authorizing Provider  aspirin 81 MG chewable tablet Chew 81 mg by mouth at bedtime as needed (for chest discomfort).     [provider]  aspirin-acetaminophen-caffeine (EXCEDRIN MIGRAINE) [phone] MG tablet Take 1 tablet by mouth every 6 (six) hours as needed for headache (or pain).    [provider]  dexlansoprazole (DEXILANT) 60 MG capsule Take 1 capsule (60 mg total) by mouth daily. 04/02/20   Armbruster, Carlota Raspberry, MD  gabapentin (NEURONTIN) 100 MG capsule Take 100 mg by mouth at bedtime as needed (for restless legs).     [provider]  HYDROcodone-acetaminophen (NORCO/VICODIN) 5-325 MG tablet Take 1 tablet by mouth every 6 (six) hours as needed for moderate pain.    [provider]  Multiple Vitamin (MULTIVITAMIN) tablet Take 1 tablet by mouth daily.    [provider]  NON FORMULARY Take 1 capsule by mouth See admin instructions. Probiotic: Flora Balance O'Donnell Formula Capsules: Take 1 capsule by mouth in the morning    [provider]  promethazine (PHENERGAN) 12.5 MG tablet Take 1 tablet (12.5 mg total) by mouth every 8 (eight) hours as needed for nausea or vomiting. 04/02/20  Yetta Flock, MD  zolpidem (AMBIEN) 10 MG tablet Take 10 mg by mouth at bedtime.  03/06/20   [provider]    Allergies    Naproxen sodium  Review of Systems   Review of Systems  Constitutional:       Per HPI, otherwise negative  HENT:       Per HPI, otherwise negative  Respiratory:       Per HPI, otherwise negative  Cardiovascular:       Per HPI, otherwise negative  Gastrointestinal: Negative for vomiting.  Endocrine:       Negative aside from HPI  Genitourinary:       Neg aside from HPI   Musculoskeletal:       Per HPI, otherwise  negative  Skin: Negative.   Neurological: Negative for syncope.    Physical Exam Updated Vital Signs BP (!) 174/116 (BP Location: Right Arm)   Pulse 99   Temp 97.9 F (36.6 C) (Oral)   Resp 20   SpO2 99%   Physical Exam Vitals and nursing note reviewed.  Constitutional:      General: She is not in acute distress.    Appearance: She is well-developed.  HENT:     Head: Normocephalic and atraumatic.  Eyes:     Conjunctiva/sclera: Conjunctivae normal.  Cardiovascular:     Rate and Rhythm: Normal rate and regular rhythm.  Pulmonary:     Effort: Pulmonary effort is normal. No respiratory distress.     Breath sounds: Normal breath sounds. No stridor.  Abdominal:     General: There is no distension.  Skin:    General: Skin is warm and dry.  Neurological:     Mental Status: She is alert and oriented to person, place, and time.     Cranial Nerves: No cranial nerve deficit.      ED Results / Procedures / Treatments   Labs (all labs ordered are listed, but only abnormal results are displayed) Labs Reviewed  BASIC METABOLIC PANEL  CBC  HEPATIC FUNCTION PANEL  ETHANOL  LIPASE, BLOOD  D-DIMER, QUANTITATIVE (NOT AT [facility])  TROPONIN I (HIGH SENSITIVITY)    EKG EKG Interpretation  Date/Time:  Wednesday April 15 2020 11:17:28 EDT Ventricular Rate:  98 PR Interval:    QRS Duration: 119 QT Interval:  360 QTC Calculation: 460 R Axis:   -66 Text Interpretation: Sinus rhythm Borderline prolonged PR interval Probable left atrial enlargement Nonspecific IVCD with LAD Left ventricular hypertrophy ST-t wave abnormality Abnormal ECG Confirmed by Carmin Muskrat ([id number]) on 04/15/2020 11:21:42 AM   Radiology MYOCARDIAL PERFUSION IMAGING  Result Date: 04/14/2020  The left ventricular ejection fraction is normal (55-65%).  Nuclear stress EF: 60%.  There was no ST segment deviation noted during stress.  There is a small defect of mild severity present in the basal inferoseptal, mid  anteroseptal and mid inferoseptal location. The defect is non-reversible and worse on rest imaging. This is consistent with breast attenuation artifact. No ischemia.  This is a low risk study.     Procedures Procedures (including critical care time)  Medications Ordered in ED Medications  sodium chloride flush (NS) 0.9 % injection 3 mL (has no administration in time range)    ED Course  I have reviewed the triage vital signs and the nursing notes.  Pertinent labs & imaging results that were available during my care of the patient were reviewed by me and considered in my medical decision making (see chart for details).  Stress test from r yesterday reviewed:   The left ventricular ejection fraction is normal (55-65%).  Nuclear stress EF: 60%.  There was no ST segment deviation noted during stress.  There is a small defect of mild severity present in the basal inferoseptal, mid anteroseptal and mid inferoseptal location. The defect is non-reversible and worse on rest imaging. This is consistent with breast attenuation artifact. No ischemia.  This is a low risk study.     Elderly female presents with almost 1 month of new symptoms. Patient is awake, alert, speaking clearly, but given her description of new chest pain, otherwise prior reassuring earlier studies, broader differential considered including infection, inflammation, dissection, pulmonary embolism.  Labs, Covid test, x-ray, EKG all ordered.  Review of the patient's chart notes ongoing evaluation, including esophagram, CT abdomen pelvis, MRI of the brain, within the past 2 months with results as below: IMPRESSION: Prematurely terminated examination only consisting of diffusion-weighted imaging and a severely motion degraded sagittal T1-weighted sequence.   No evidence of acute infarct.     Electronically Signed   By: Kellie Simmering DO   On: 03/06/2020 18:54 IMPRESSION: 1. No acute abdominal/pelvic findings, mass  lesions or adenopathy. 2. Advanced atherosclerotic calcifications involving the aorta but no aneurysm or dissection. 3. 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   Aortic Atherosclerosis (ICD10-I70.0).     Electronically Signed   By: Marijo Sanes M.D.   On: 03/19/2020 05:24 FINDINGS: Fluoroscopic evaluation of swallowing demonstrates disruption of primary esophageal peristaltic waves with proximal escape. No fixed stricture, fold thickening or mass. No reflux with the water siphon maneuver. The patient swallowed a 13 mm barium tablet which freely passed into the stomach.   IMPRESSION: Slight esophageal dysmotility with proximal escape.   No fixed stricture.     Electronically Signed   By: Rolm Baptise M.D.   On: 04/07/2020 11:53   2:19 PM Initial findings reassuring.  Given the patient's prior CT abdomen pelvis with possible mass-effect on the ventricle, CT chest will be performed.  5:14 PM Patient in no distress, sitting upright, speaking clearly, vital signs unremarkable. I reviewed the CT, which is reassuring, no evidence for acute new pathology. Discussed findings again, at length, and reassuring evaluation over the past 2 weeks per Patient will follow up with GI, primary care, start Carafate in addition to her ongoing PPI.  MDM Number of Diagnoses or Management Options Atypical chest pain: new, needed workup   Amount and/or Complexity of Data Reviewed Clinical lab tests: reviewed Tests in the radiology section of CPT: reviewed Tests in the medicine section of CPT: reviewed Decide to obtain previous medical records or to obtain history from someone other than the patient: yes Review and summarize past medical records: yes Independent visualization of images, tracings, or specimens: yes  Risk of Complications, Morbidity, and/or Mortality Presenting problems: high Diagnostic procedures: high Management options:  high  Critical Care Total time providing critical care: < 30 minutes  Patient Progress Patient progress: stable    Final Clinical Impression(s) / ED Diagnoses Final diagnoses:  Atypical chest pain    Rx / DC Orders ED Discharge Orders         Ordered    sucralfate (CARAFATE) 1 g tablet  3 times daily with meals & bedtime     Discontinue  Reprint    Note to Pharmacy: Take for one week   04/15/20 1713  Carmin Muskrat, MD 04/15/20 1715

## 2020-04-15 NOTE — Telephone Encounter (Signed)
Left message for patient to call back  

## 2020-04-15 NOTE — Telephone Encounter (Signed)
New Message  Patient was calling in to get her stress test results. Please call back

## 2020-04-15 NOTE — ED Notes (Addendum)
This RN went into pt room to move pt from room 14 to the hall to await speaking with MD. Pt upset stating "everyone is blowing me off. I need to be admitted. I have been to [facility], [facility]. No one wants to help. I have seen my PCP and GI, I had a stress test yesterday, I have had a barium swallow screen. They cant find anything. But something is wrong" Pt assured that the ED has ruled out any life threatening conditions at this time. And the ED may not always be able to find the cause of the issue and it is important to follow up outpatient and with PCP. Pt informed that she could still wait to speak with MD, we just need to move her to the hall due to department volume and EMS awaiting the room. Pt states "He probably wont say or do anything different" Pt requesting to just be discharged. Pt ambulated out of the ED without difficulty. Pt verbalizes the need to follow up with PCP. Pt states "I know its not you. I am just frustrated"

## 2020-04-15 NOTE — ED Notes (Signed)
Pts daughter in ED lobby requesting to come back prior to pt being DC'ed. Daughter wants pt admitted. Per Screeners: pts daughter is persistent about coming back. Screeners asked to hold visitors momentarily until MD has spoken with pt. Pt is alert and oriented.

## 2020-04-15 NOTE — Telephone Encounter (Signed)
Nuclear stress normal, okay to schedule EGD. Did she say why she needs doxycycline? No contraindications I can see for that at this time. Thanks

## 2020-04-15 NOTE — Discharge Instructions (Signed)
As discussed, your evaluation today has been largely reassuring.  But, it is important that you monitor your condition carefully, and do not hesitate to return to the ED if you develop new, or concerning changes in your condition. ? ?Otherwise, please follow-up with your physician for appropriate ongoing care. ? ?

## 2020-04-15 NOTE — ED Notes (Addendum)
Upon going into DC pt. Pt is requesting to speak to the MD. Pt is requesting to be admitted. MD made aware.

## 2020-04-16 ENCOUNTER — Other Ambulatory Visit: Payer: Self-pay

## 2020-04-16 ENCOUNTER — Telehealth: Payer: Self-pay | Admitting: Cardiovascular Disease

## 2020-04-16 NOTE — Telephone Encounter (Signed)
I scheduled EGD for 05-06-20. Read her your notes but she still wanted to speak to nurse [facility].

## 2020-04-16 NOTE — Telephone Encounter (Signed)
Pt calling in for test results. Reviewed results with pt. She states that she is having some unusual issues. She reports that since the first of may no test has been able to figure out her issues. Pt states that she has googled her symptoms and it had mentioned cardiovascular disease. She asked if her stress test showed any issues with arteries, notified that her stress test was low risk with no ischemia.  Pt states she went to [facility] yesterday and had a CT and cardiac labs drawn. Pt states her CT was "perfect". Her troponin was 3. She was sent home.  Pt reports that her arms and legs are weak and that she is cold natured and was concerned about circulation issues.  Pt also reports that she has had fluctuating BP issues. She reports that on May 7th her BP was up to 200 and she went to her PCP and they send her to the ED. The ED sent her home on some BP medication and later her PCP told her to stop her BP meds. With her BP fluctuation her PCP placed her back on her amlodipine and per pt was told to take 2.5mg  if BP was consistently 140/90.  Pt reports that her BP this morning was 132/90 and this afternoon it was 162/101. Advised pt to check pressure again while on the phone, pressure was 141/94 with HR of 86. Pt states that she stays nauseated but that when her BP is elevated it gets worse. She reports that the nausea has eased off and states that she has a dull "head ache" reports that it feels "full" but not bad enough that she needs to take any medication.   Pt reports current chest discomfort that is more like a "soreness" between her neck and the top of her bra. She states it doesn't feel like an elephant on her chest but she is not sure if it feels tight. She does endorse SOB with movement. Pt reports that she had discussed this with Dr.Berry and that he didn't feel the discomfort was cardiac related but more in her esophagus. She sees GI and has an endoscopy scheduled for July 7th.  She was referred to neurology by GI and has an appt in July as well.  Pt rechecked BP while on the phone and it was 138/91 with a HR of 89  Spoke with DOD Dr.Hochrein who recommended she increase her amlodipine to 5mg  daily and to contact her PCP.  Reviewed recommendation with pt who verbalized understanding and stated she didn't need the prescription sent into her pharmacy because she had plenty of refills. Advised the pt continue to keep track of her BP and speak with her PCP tomorrow. Pt states she has an OV on 04/21/20 with her PCP. No other questions from pt at this time.

## 2020-04-16 NOTE — Telephone Encounter (Signed)
    Pt is returning call about her stress test results

## 2020-04-17 ENCOUNTER — Inpatient Hospital Stay (HOSPITAL_COMMUNITY): Admission: RE | Admit: 2020-04-17 | Payer: Medicare Other | Source: Ambulatory Visit

## 2020-04-17 MED ORDER — DEXLANSOPRAZOLE 60 MG PO CPDR: 60.0000 mg | DELAYED_RELEASE_CAPSULE | Freq: Every day | ORAL | 3 refills | Status: DC

## 2020-04-17 NOTE — Telephone Encounter (Signed)
Patient left a vm asking for rx for her dexilant.  This was sent to the pharmacy.  She is advised via VM okay to take doxycycline if her primary or other MD as ordered

## 2020-04-17 NOTE — Addendum Note (Signed)
Addended by: Annett Fabian on: 04/17/2020 01:47 PM   Modules accepted: Orders

## 2020-04-22 ENCOUNTER — Telehealth: Payer: Self-pay | Admitting: Gastroenterology

## 2020-04-22 NOTE — Telephone Encounter (Signed)
Pt reports she is still experiencing a lot of nausea and now abd pain and she is very stressed about what to do.  She has an EGD scheduled with [facility] on 7-7- but can't wait that long to get relief.  She took the phenergan but it wore off very quickly.  Dexilant helped with the tenderness in the esophagus but it doesn't last all day. Now she has bad pain above her belly button that sortof feels like hunger and she is having to force herself to eat. Everything smells strange and tastes really salty.  She asked to be seen today but we have no openings.  She has been to the ER multiple times and they have not found any reason for her symptoms.  Her PCP started her on lorazepam and Motrin this morning and it seemed to help but wore off in 3 hours and she wasn't sure when she could take it again.  I instructed to call their office and clarify how he intended her to take what he prescribed. I scheduled her for an office visit with Mike Gip tomorrow morning.  Patient has had a normal cardiology work up. She has a previsit tomorrow for EGD that we can cancel and instruct her while she is here if it is decided to continue with EGD.

## 2020-04-23 ENCOUNTER — Encounter: Payer: Self-pay | Admitting: Physician Assistant

## 2020-04-23 ENCOUNTER — Ambulatory Visit: Payer: Medicare Other | Admitting: Physician Assistant

## 2020-04-23 VITALS — BP 124/84 | HR 86 | Ht 63.0 in | Wt 143.2 lb

## 2020-04-23 DIAGNOSIS — K219 Gastro-esophageal reflux disease without esophagitis: Secondary | ICD-10-CM | POA: Diagnosis not present

## 2020-04-23 DIAGNOSIS — R112 Nausea with vomiting, unspecified: Secondary | ICD-10-CM | POA: Diagnosis not present

## 2020-04-23 DIAGNOSIS — Z01818 Encounter for other preprocedural examination: Secondary | ICD-10-CM

## 2020-04-23 DIAGNOSIS — R519 Headache, unspecified: Secondary | ICD-10-CM

## 2020-04-23 DIAGNOSIS — R630 Anorexia: Secondary | ICD-10-CM

## 2020-04-23 MED ORDER — LANSOPRAZOLE 30 MG PO CPDR
30.0000 mg | DELAYED_RELEASE_CAPSULE | Freq: Every day | ORAL | Status: DC | PRN
Start: 2020-04-23 — End: 2020-04-23

## 2020-04-23 MED ORDER — LANSOPRAZOLE 15 MG PO CPDR
30.0000 mg | DELAYED_RELEASE_CAPSULE | Freq: Every day | ORAL | Status: DC | PRN
Start: 2020-04-23 — End: 2020-09-18

## 2020-04-23 NOTE — Progress Notes (Deleted)
Subjective:    Patient ID: Kayla Terry, female    DOB: 1945-11-29, 74 y.o.   MRN: [id number]  HPI Kayla Terry is a pleasant 74 year old white female, established with Dr. Havery Moros who was last seen here on 04/02/2020 and comes in today after ER visit with ongoing persistent symptoms. She has history of GERD, hypertension and prior back surgeries.  She has also had issues with postherpetic neuralgia for which she had been given hydrocodone and gabapentin. She relates distinct onset of her current symptoms on Mar 06, 2020 and says she was healthy prior to that time.  Her husband did pass away in February 2021.  She says her husband's death was expected and initially she thought she was doing okay but then in March and April started having significant problems with nighttime anxiety and insomnia. She had an acute episode on 03/06/2020 after she had gone to bed that night with a sudden sensation of the entire room spinning.  She got up took her blood pressure which apparently was quite high.  The next day she saw her PCP still had an elevated blood pressure and was advised to go to the emergency room for evaluation.  Cardiac work-up has been negative, including recent stress test which was read as low risk.  She had MRI of the head done which she was unable to complete as she developed some atypical chest discomfort during the study which had to be aborted.  Study was read as negative with the caveat that it was incomplete. With that ER visit she was complaining of weakness, nausea in addition to the vague uneasiness in her chest.  Labs including CBC hepatic panel D-dimer and troponins as well as Covid test were all negative. Prior to that CT of the abdomen pelvis on 03/20/2019 showed atherosclerosis was otherwise negative.  Barium swallow on 04/07/2020 showed slight esophageal dysmotility. Chest CT 04/15/2020 showed a 4 mm pulmonary nodule otherwise negative. She had previously been taking Prevacid 30 mg which was  bumped up to twice daily and then when seen by Dr. Havery Moros she was switched to Dexilant 60 mg daily, continued on Phenergan which she had been taking very regularly and scheduled for EGD. She has not had any more episodes of acute spinning sensation, does continue to complain of a dull headache in the back of her head over the past several weeks, ongoing problems with nausea poor appetite queasiness sometimes aversion to food and smells. At some point she did not feel that the Dexilant was making much difference so she stopped taking it, She was seen by her PCP Dr. Redmond Pulling yesterday who started on lorazepam 0.5 mg twice daily.  She took this yesterday and then a dose at bedtime in addition to gabapentin, and this morning says she feels better than she has in the past couple of months. In addition she stopped taking hydrocodone over 24 hours ago and has been off Zoldipem, which she had been given for sleep, over the past week. Her daughter who accompanies her says that they did not feel that she tolerated the sleeping pill well at all and recalls an episode where her mom had called her one evening rambling and talking, and then the following day had no recollection of this. They have been referred to neurology and have appointment scheduled on July 19th. Patient is wondering today whether she still needs to go through with upper endoscopy.  Review of Systems Pertinent positive and negative review of systems were noted in the  above HPI section.  All other review of systems was otherwise negative.  Outpatient Encounter Medications as of 04/23/2020  Medication Sig   aspirin 81 MG chewable tablet Chew 81 mg by mouth at bedtime as needed (for chest discomfort).    aspirin-acetaminophen-caffeine (EXCEDRIN MIGRAINE) 250-250-65 MG tablet Take 1 tablet by mouth every 6 (six) hours as needed for headache (or pain).   gabapentin (NEURONTIN) 100 MG capsule Take 100 mg by mouth at bedtime as needed (for  restless legs).    LORazepam (ATIVAN) 0.5 MG tablet Take 0.5 mg by mouth. 1-2 tablets daily   Multiple Vitamin (MULTIVITAMIN) tablet Take 1 tablet by mouth daily.   NON FORMULARY Take 1 capsule by mouth See admin instructions. Probiotic: Flora Balance O'Donnell Formula Capsules: Take 1 capsule by mouth in the morning   promethazine (PHENERGAN) 12.5 MG tablet Take 0.5 tablets (6.25 mg total) by mouth every 8 (eight) hours as needed for nausea or vomiting.   [DISCONTINUED] dexlansoprazole (DEXILANT) 60 MG capsule Take 1 capsule (60 mg total) by mouth daily.   [DISCONTINUED] HYDROcodone-acetaminophen (NORCO/VICODIN) 5-325 MG tablet Take 1 tablet by mouth at bedtime.    [DISCONTINUED] promethazine (PHENERGAN) 12.5 MG tablet Take 1 tablet (12.5 mg total) by mouth every 8 (eight) hours as needed for nausea or vomiting. (Patient taking differently: Take 12.5 mg by mouth every 8 (eight) hours. )   [DISCONTINUED] sucralfate (CARAFATE) 1 g tablet Take 1 tablet (1 g total) by mouth 4 (four) times daily -  with meals and at bedtime.   lansoprazole (PREVACID) 15 MG capsule Take 2 capsules (30 mg total) by mouth daily as needed.   [DISCONTINUED] lansoprazole (PREVACID) 30 MG capsule Take 1 capsule (30 mg total) by mouth daily as needed.   [DISCONTINUED] zolpidem (AMBIEN) 10 MG tablet Take 10 mg by mouth at bedtime.  (Patient not taking: Reported on 04/23/2020)   Facility-Administered Encounter Medications as of 04/23/2020  Medication   gi cocktail (Maalox,Lidocaine,Donnatal)   Allergies  Allergen Reactions   Naproxen Sodium Anxiety   Patient Active Problem List   Diagnosis Date Noted   Benign essential hypertension 06/08/2017   Acid reflux disease 03/08/2016   Hyperlipidemia 03/08/2016   Nocturia 03/08/2016   Restless leg syndrome 03/08/2016   Chest tightness 08/21/2012   Social History   Socioeconomic History   Marital status: Widowed    Spouse name: Not on file   Number of  children: Not on file   Years of education: Not on file   Highest education level: Not on file  Occupational History   Not on file  Tobacco Use   Smoking status: Never Smoker   Smokeless tobacco: Never Used  Vaping Use   Vaping Use: Never used  Substance and Sexual Activity   Alcohol use: No   Drug use: No   Sexual activity: Not on file  Other Topics Concern   Not on file  Social History Narrative   Not on file   Social Determinants of Health   Financial Resource Strain:    Difficulty of Paying Living Expenses:   Food Insecurity:    Worried About [place] in the Last Year:    Arboriculturist in the Last Year:   Transportation Needs:    Film/video editor (Medical):    Lack of Transportation (Non-Medical):   Physical Activity:    Days of Exercise per Week:    Minutes of Exercise per Session:   Stress:    Feeling  of Stress :   Social Connections:    Frequency of Communication with Friends and Family:    Frequency of Social Gatherings with Friends and Family:    Attends Religious Services:    Active Member of Clubs or Organizations:    Attends Music therapist:    Marital Status:   Intimate Partner Violence:    Fear of Current or Ex-Partner:    Emotionally Abused:    Physically Abused:    Sexually Abused:     Ms. Flaming family history includes Alcohol abuse in her brother; Cancer in her mother; Leukemia in her mother; Lung cancer in her father; Multiple myeloma in her brother; Multiple sclerosis in her sister.      Objective:    Vitals:   04/23/20 0842  BP: 124/84  Pulse: 86  SpO2: 98%    Physical Exam Well-developed well-nourished elderly female, accompanied by her daughter in no acute distress.  Height, Weight 143, BMI 25.3  HEENT; nontraumatic normocephalic, EOMI, PE R R LA, sclera anicteric. Oropharynx; not examined today Neck; supple, no JVD Cardiovascular; regular rate and rhythm with  S1-S2, no murmur rub or gallop Pulmonary; Clear bilaterally Abdomen; soft, nontender, nondistended, no palpable mass or hepatosplenomegaly, bowel sounds are active Rectal; not done today Skin; benign exam, no jaundice rash or appreciable lesions Extremities; no clubbing cyanosis or edema skin warm and dry Neuro/Psych; alert and oriented x4, grossly nonfocal mood and affect appropriate       Assessment & Plan:   #19  74 year old white female with multiple symptoms over the past 2 months with intermittent atypical uneasiness  in the chest, background history of mild GERD, cardiac evaluation thus far negative including negative stress test earlier this month. Patient had also had significant complaints of severe nausea, lack of appetite food aversion and aversion to odors and complains of persistent dull posterior headache. It also been having issues with insomnia over the past couple of months.  She had been started on several new medications since April 2021 including Zoldipem  for sleep, Dexilant, Carafate, Phenergan, superimposed on her usual low-dose hydrocodone and gabapentin. Patient feeling much better over the past 24 hours off multiple medications. I suspect majority of her symptoms have been secondary to combination of adverse medication effects plus minus anxiety.  She did not have any improvement in symptoms with Dexilant or Carafate and suspect the atypical chest discomfort is not related to reflux or esophagitis. 2.  Hypertension 3.  Prior back surgeries 4.  History of postherpetic neuralgia 5.  Colon cancer screening-up-to-date-negative colonoscopy 2015, elsewhere. Plan Long discussion with patient and her daughter today as outlined above. Stop Dexilant, stop Carafate. Restart Prevacid 30 mg p.o. every morning Continue lorazepam 0.5 mg p.o. twice daily, just initiated earlier this week. Stay off hydrocodone Stay off Zoldipem Use Phenergan only at 12.5 mg dosage as needed and  not around-the-clock May continue Neurontin as needed For now we will leave upper endoscopy scheduled for 05/06/2020 with Dr. Havery Moros.  Have asked patient to call back next Thursday with an update and if she has had continued improvement/resolution of her symptoms, can cancel EGD.  Kristiana Jacko S Veona Bittman PA-C 04/23/2020   Cc: Christain Sacramento, MD

## 2020-04-23 NOTE — Patient Instructions (Addendum)
If you are age 74 or older, your body mass index should be between 23-30. Your Body mass index is 25.37 kg/m. If this is out of the aforementioned range listed, please consider follow up with your Primary Care Provider.  If you are age 61 or younger, your body mass index should be between 19-25. Your Body mass index is 25.37 kg/m. If this is out of the aformentioned range listed, please consider follow up with your Primary Care Provider.   You have been scheduled for an endoscopy. Please follow written instructions given to you at your visit today. If you use inhalers (even only as needed), please bring them with you on the day of your procedure.  Due to recent COVID-19 restrictions implemented by [employer] and state authorities and in an effort to keep both patients and staff as safe as possible, [facility] requires COVID-19 testing prior to any scheduled endoscopic procedure. The testing center is located at [address] [place], [place] [address] in the [facility] [employer]  suite.  Your appointment has been scheduled for Monday, 05-04-20 at 10:00am.   Please bring your insurance cards to this appointment. You will require your COVID screen 2 business days prior to your endoscopic procedure.  You are not required to quarantine after your screening.  You will only receive a phone call with the results if it is POSITIVE.  If you do not receive a call the day before your procedure you should begin your prep, if ordered, and you should report to the endo center for your procedure at your designated appointment arrival time ( one hour prior to the procedure time). There is no cost to you for the screening on the day of the swab.  [facility] will file with your insurance company for the testing.    You may receive an automated phone call prior to your procedure or have a message in your MyChart that you have an appointment for a BP/15 at  the [facility], please disregard this message.  Your testing will be at the [address] [facility] location.   If you are leaving [facility] travel [place] on [address], turn left onto [address], turn night onto [address] at the 1st stop light turn right, pass the [employer] on your right and proceed to [address] (white building).   ____________________________________________________________________   Keep your appointment with Neurology.  Continue Lorazepam.  Discontinue Carafate and Dexilant.  Resume Prevacid 15 mg OTC daily.  Use phenergan 1/2 tablet every 8 hours as needed.  Stop hydrocodone.  Stop sleeping pill.  You can use gabapentin at bedtime.   Please call back next Thursday and speak to Dr. Lanetta Inch nurse with an update and cancel EGD if you are continuing to feel better.  Thank you for entrusting me with your care and for choosing [facility], Amy Oswald Hillock, P.A.- C.

## 2020-04-23 NOTE — Progress Notes (Signed)
Agree with assessment and plan as outlined.  

## 2020-04-29 ENCOUNTER — Encounter (INDEPENDENT_AMBULATORY_CARE_PROVIDER_SITE_OTHER): Payer: Self-pay | Admitting: Otolaryngology

## 2020-04-29 ENCOUNTER — Other Ambulatory Visit: Payer: Self-pay

## 2020-04-29 ENCOUNTER — Ambulatory Visit (INDEPENDENT_AMBULATORY_CARE_PROVIDER_SITE_OTHER): Payer: Medicare Other | Admitting: Otolaryngology

## 2020-04-29 VITALS — Temp 97.3°F

## 2020-04-29 DIAGNOSIS — H6121 Impacted cerumen, right ear: Secondary | ICD-10-CM

## 2020-04-29 DIAGNOSIS — J310 Chronic rhinitis: Secondary | ICD-10-CM | POA: Diagnosis not present

## 2020-04-29 DIAGNOSIS — H9202 Otalgia, left ear: Secondary | ICD-10-CM

## 2020-04-29 NOTE — Progress Notes (Signed)
HPI: Kayla Terry is a 74 y.o. female who presents for evaluation of intermittent left ear pain.  She describes deep left ear pain especially when she lies on that ear.  It occasionally wakes her up at night when she sleeps on the left side.  She does not seem to have problems if she sleeps on her right side.  She also describes a lot of thick mucus in her nose especially on the right side.  She does not have any trouble eating or swallowing.  She has not noted any change in her hearing.  Past Medical History:  Diagnosis Date  . Allergy   . GERD (gastroesophageal reflux disease)   . Herpes zoster without complications   . Hypertension    Past Surgical History:  Procedure Laterality Date  . ABDOMINAL HYSTERECTOMY  02/1984  . BACK SURGERY    . LUMBAR LAMINECTOMY     Social History   Socioeconomic History  . Marital status: Widowed    Spouse name: Not on file  . Number of children: Not on file  . Years of education: Not on file  . Highest education level: Not on file  Occupational History  . Not on file  Tobacco Use  . Smoking status: Never Smoker  . Smokeless tobacco: Never Used  Vaping Use  . Vaping Use: Never used  Substance and Sexual Activity  . Alcohol use: No  . Drug use: No  . Sexual activity: Not on file  Other Topics Concern  . Not on file  Social History Narrative  . Not on file   Social Determinants of Health   Financial Resource Strain:   . Difficulty of Paying Living Expenses:   Food Insecurity:   . Worried About Charity fundraiser in the Last Year:   . Arboriculturist in the Last Year:   Transportation Needs:   . Film/video editor (Medical):   Marland Kitchen Lack of Transportation (Non-Medical):   Physical Activity:   . Days of Exercise per Week:   . Minutes of Exercise per Session:   Stress:   . Feeling of Stress :   Social Connections:   . Frequency of Communication with Friends and Family:   . Frequency of Social Gatherings with Friends and Family:    . Attends Religious Services:   . Active Member of Clubs or Organizations:   . Attends Archivist Meetings:   Marland Kitchen Marital Status:    Family History  Problem Relation Age of Onset  . Cancer Mother   . Leukemia Mother   . Lung cancer Father   . Multiple sclerosis Sister   . Alcohol abuse Brother   . Multiple myeloma Brother    Allergies  Allergen Reactions  . Naproxen Sodium Anxiety   Prior to Admission medications   Medication Sig Start Date End Date Taking? Authorizing Provider  aspirin 81 MG chewable tablet Chew 81 mg by mouth at bedtime as needed (for chest discomfort).    Yes [provider]  aspirin-acetaminophen-caffeine (EXCEDRIN MIGRAINE) [phone] MG tablet Take 1 tablet by mouth every 6 (six) hours as needed for headache (or pain).   Yes [provider]  gabapentin (NEURONTIN) 100 MG capsule Take 100 mg by mouth at bedtime as needed (for restless legs).    Yes [provider]  lansoprazole (PREVACID) 15 MG capsule Take 2 capsules (30 mg total) by mouth daily as needed. 04/23/20  Yes Esterwood, Amy S, PA-C  LORazepam (ATIVAN) 0.5 MG  tablet Take 0.5 mg by mouth. 1-2 tablets daily   Yes [provider]  Multiple Vitamin (MULTIVITAMIN) tablet Take 1 tablet by mouth daily.   Yes [provider]  NON FORMULARY Take 1 capsule by mouth See admin instructions. Probiotic: Flora Balance O'Donnell Formula Capsules: Take 1 capsule by mouth in the morning   Yes [provider]  promethazine (PHENERGAN) 12.5 MG tablet Take 0.5 tablets (6.25 mg total) by mouth every 8 (eight) hours as needed for nausea or vomiting. 04/23/20  Yes Esterwood, Amy S, PA-C     Positive ROS: Otherwise negative  All other systems have been reviewed and were otherwise negative with the exception of those mentioned in the HPI and as above.  Physical Exam: Constitutional: Alert, well-appearing, no acute distress Ears: External ears without lesions or  tenderness.  She has minimal wax buildup on the left side with a clear left TM and good mobility on pneumatic otoscopy.  She had moderate wax buildup on the right side that was cleaned with a curette.  Right TM was clear otherwise. Nasal: External nose without lesions. Septum midline with mild rhinitis and clear mucus discharge.  No signs of infection. Oral: Lips and gums without lesions. Tongue and palate mucosa without lesions. Posterior oropharynx clear.  Tonsil regions appear benign bilaterally.  Indirect laryngoscopy revealed a clear base of tongue vallecula and epiglottis.  Vocal cords were clear. Neck: No palpable adenopathy or masses.  No pain or swelling around the ear.  She does have some slight TMJ problems on the left side but no palpable pain or discomfort in the office today. Respiratory: Breathing comfortably  Skin: No facial/neck lesions or rash noted.  Cerumen impaction removal  Date/Time: 04/29/2020 1:38 PM Performed by: Rozetta Nunnery, MD Authorized by: Rozetta Nunnery, MD   Consent:    Consent obtained:  Verbal   Consent given by:  Patient Procedure details:    Location:  R ear Comments:     Right ear canal was cleaned with a curette and forceps.  TMs were clear bilaterally    Assessment: Deep left ear pain questionable etiology. For the nasal drainage recommend use of saline irrigation.  Plan: Recommend use of saline nasal irrigation for the mucus buildup. Recommended use of NSAIDs if she is having any pain or discomfort in the ear.  Reassured her of normal ear examination otherwise.  Radene Journey, MD

## 2020-04-30 DIAGNOSIS — E538 Deficiency of other specified B group vitamins: Secondary | ICD-10-CM

## 2020-04-30 HISTORY — DX: Deficiency of other specified B group vitamins: E53.8

## 2020-05-05 ENCOUNTER — Other Ambulatory Visit: Payer: Self-pay | Admitting: Family Medicine

## 2020-05-05 DIAGNOSIS — Z1231 Encounter for screening mammogram for malignant neoplasm of breast: Secondary | ICD-10-CM

## 2020-05-06 ENCOUNTER — Other Ambulatory Visit: Payer: Self-pay

## 2020-05-06 ENCOUNTER — Encounter: Payer: Self-pay | Admitting: Neurology

## 2020-05-06 ENCOUNTER — Ambulatory Visit: Payer: Medicare Other | Admitting: Neurology

## 2020-05-06 ENCOUNTER — Telehealth: Payer: Self-pay | Admitting: Neurology

## 2020-05-06 ENCOUNTER — Encounter: Payer: Self-pay | Admitting: *Deleted

## 2020-05-06 ENCOUNTER — Encounter: Payer: Medicare Other | Admitting: Gastroenterology

## 2020-05-06 VITALS — BP 142/93 | HR 80 | Ht 63.0 in | Wt 144.0 lb

## 2020-05-06 DIAGNOSIS — R258 Other abnormal involuntary movements: Secondary | ICD-10-CM

## 2020-05-06 DIAGNOSIS — R6889 Other general symptoms and signs: Secondary | ICD-10-CM

## 2020-05-06 DIAGNOSIS — G959 Disease of spinal cord, unspecified: Secondary | ICD-10-CM | POA: Diagnosis not present

## 2020-05-06 DIAGNOSIS — M6281 Muscle weakness (generalized): Secondary | ICD-10-CM

## 2020-05-06 DIAGNOSIS — R202 Paresthesia of skin: Secondary | ICD-10-CM

## 2020-05-06 DIAGNOSIS — R5383 Other fatigue: Secondary | ICD-10-CM | POA: Diagnosis not present

## 2020-05-06 DIAGNOSIS — G1229 Other motor neuron disease: Secondary | ICD-10-CM

## 2020-05-06 MED ORDER — ALPRAZOLAM 0.25 MG PO TABS: ORAL_TABLET | ORAL | 0 refills | Status: DC

## 2020-05-06 NOTE — Progress Notes (Signed)
[facility]    Provider:  Dr Lucia Gaskins Requesting Provider: [name] DO Primary Care Provider:  [name], MD  CC: Weakness ongoing for years and recently worsening  HPI:  Kayla Terry is a 74 y.o. female here as requested by [name] DO for worsening headache.  She has a past medical history of GERD, herpes zoster without complications, hypertension,chronic pain, hyperlipidemia, restless leg syndrome, nocturia, chest tightness, dizziness.  I reviewed [name] DO's notes: Patient was last seen Mar 12, 2020, she described ear crackling and dizziness, she had previously been to the emergency room for dizziness and chest pain and troponins were negative, negative chest x-ray, negative lab work, negative MRI of the head (was a limited MR), patient's blood pressure was elevated at the time of patient was started on amlodipine 5 mg daily.  She was diagnosed with possible GERD and started on medication.  When patient was last seen she stated her symptoms were still persistent, blood pressure between 114 and 130 at home systolic, she was given recent eyedrops for potential glaucoma and was concerned that this could be a potential side effect of the eyedrops, she complains of dizziness and headache in the back of the head at that time for 6 days with a recent developing of left ear crackling without any ear drainage, decrease in hearing, or ear pain had a slightly clear runny nose but sore throat, coughing, dizziness feels like the room is spinning, can happen with sitting or standing, can last for hours no vomiting but she does have nausea.Shawn Lazoff DO's examination showed patient in no distress, normal head, eyes, nose, throat however she had impacted cerumen in the left ear which was removed; also cardiovascular, pulmonary, abdominal, musculoskeletal, neurologic and psychiatric examination was normal, negative Dix-Hallpike maneuver, patient's dizziness did not appear to be  orthostatic related to dizziness is persistent regardless of position, she was told to try stopping the eyedrops for 10 days, meclizine as needed.   I also reviewed emergency room notes recently seen April 15, 2020, she presented with ongoing weakness, chest pain, anorexia, nausea, started May 6, she had already been to the emergency room been evaluated by gastroenterology and had a stress test the day before, she still persistently has generalized weakness without focality, pressure in her sternum, associated anorexia, nausea, fatigue, no fever, no vomiting no shortness of breath, also had one course of doxycycline.  Her blood pressure was elevated 174/116 in the emergency room, otherwise her examination showed that she was not in acute distress, rate and rhythm normal, cardiovascular, pulmonary, abdominal, neurologic all normal.  She had an extensive work-up including imaging of her abdomen pelvis and chest, there was some mass-effect on the right ventricle but the emergency room did not think that this was significant and did further follow-up, they also reviewed the stress test, they diagnosed her with atypical chest pain.  CBC and BMP were normal.  They also checked her troponins, LFTs, D-dimer, lipase and discharged with a diagnosis of atypical chest pain.  Patient is here alone today and states that although she has been having headaches in the back of the head she is not concerned by that and her biggest concern is because her arms and legs have been feeling weak.  She has not been feeling well since 1 May.  She went to the hospital, her blood pressure was elevated, she was having pain in her chest, evaluation showed no cardiac etiology, she was started on blood pressure  meds and came home since then she has been having a dull headache in the back of the head, Excedrin helps a little bit, her legs and arms stay weak, she can function but they fall asleep and her lips tingle. This all started in May at the  emergency room. In early may she went to the hospital for elevated blood pressure and chest pain. She is now off of amlodipine.  Patient reports that the weakness predates all of this above, weakness ongoing for years and worsening recently. The weakness has been worsening. She was on doxycycline recently and she had tingling in the face and the legs and arms and unclear if this made her symptoms worse, ongoing slowly progressively worsening. She feels like her arms and legs are asleep. She can even be in bed and they feel asleep and she gets up and feels better. Worse in the left arm but all the limbs are affected, she denies any difficulty getting out of a chair or off of the floor, she can get to the top of the stairs but she feels out of breath but not due to weakness. She is generally weak all over. Slowly progressive. Numbness and tingling of all the limbs not in the face since stopping doxycycline, she has to take her time because she is afraid she is going to fall. No muscle loss or weight loss. She has noticed very occasion action tremor of the left hand and she is more cold natured. Her husband died in December 25, 2022, she is on zolpidem and she take neurontin as well. She has chronic pain. headache is mild in the back of the head not positional or exertional. 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 No double vision, no droopy eyelids, no difficulty swallowing.   Reviewed notes, labs and imaging from outside physicians, which showed  I reviewed MRI of the brain report which was of poor quality, patient was unable to tolerate the full examination, and the images that were taken were severely motion degraded.  The limited study diffusion weighted showed no acute infarct.  Review of Systems: Patient complains of symptoms per HPI as well as the following symptoms: Insomnia, restless legs, headache, numbness, weakness, dizziness, fatigue, chest pain, feeling cold, decreased energy,  change in appetite. Pertinent negatives and positives per HPI. All others negative.   Social History   Socioeconomic History  . Marital status: Widowed    Spouse name: Not on file  . Number of children: Not on file  . Years of education: Not on file  . Highest education level: Not on file  Occupational History  . Not on file  Tobacco Use  . Smoking status: Never Smoker  . Smokeless tobacco: Never Used  Vaping Use  . Vaping Use: Never used  Substance and Sexual Activity  . Alcohol use: No  . Drug use: No  . Sexual activity: Not on file  Other Topics Concern  . Not on file  Social History Narrative   Lives alone    Her husband passed 2019/12/26  Caffeine: maybe 1 cup/day, mostly water    Social Determinants of Health   Financial Resource Strain:   . Difficulty of Paying Living Expenses:   Food Insecurity:   . Worried About Programme researcher, broadcasting/film/video in the Last Year:   . Barista in the Last Year:   Transportation Needs:   . Freight forwarder (Medical):   Marland Kitchen Lack of Transportation (Non-Medical):  Physical Activity:   . Days of Exercise per Week:   . Minutes of Exercise per Session:   Stress:   . Feeling of Stress :   Social Connections:   . Frequency of Communication with Friends and Family:   . Frequency of Social Gatherings with Friends and Family:   . Attends Religious Services:   . Active Member of Clubs or Organizations:   . Attends Archivist Meetings:   Marland Kitchen Marital Status:   Intimate Partner Violence:   . Fear of Current or Ex-Partner:   . Emotionally Abused:   Marland Kitchen Physically Abused:   . Sexually Abused:     Family History  Problem Relation Age of Onset  . Cancer Mother   . Leukemia Mother   . Lung cancer Father   . Multiple myeloma Father   . Multiple sclerosis Sister   . Alcohol abuse Brother   . Multiple myeloma Brother     Past Medical History:  Diagnosis Date  . Allergy   . GERD (gastroesophageal reflux disease)   . Herpes  zoster without complications   . Hypertension     Patient Active Problem List   Diagnosis Date Noted  . Muscle weakness 05/06/2020  . Benign essential hypertension 06/08/2017  . Acid reflux disease 03/08/2016  . Hyperlipidemia 03/08/2016  . Nocturia 03/08/2016  . Restless leg syndrome 03/08/2016  . Chest tightness 08/21/2012    Past Surgical History:  Procedure Laterality Date  . ABDOMINAL HYSTERECTOMY  02/1984  . BACK SURGERY    . LAPAROSCOPIC BILATERAL SALPINGO OOPHERECTOMY    . LUMBAR LAMINECTOMY    . OOPHORECTOMY  02/29/1984   unilateral    Current Outpatient Medications  Medication Sig Dispense Refill  . aspirin EC 81 MG tablet Take 81 mg by mouth daily. Swallow whole.    Marland Kitchen aspirin-acetaminophen-caffeine (EXCEDRIN MIGRAINE) 250-250-65 MG tablet Take 1 tablet by mouth every 6 (six) hours as needed for headache (or pain).    Marland Kitchen gabapentin (NEURONTIN) 100 MG capsule Take 100 mg by mouth 3 (three) times daily.     . lansoprazole (PREVACID) 15 MG capsule Take 2 capsules (30 mg total) by mouth daily as needed. (Patient taking differently: Take 15 mg by mouth daily as needed. )    . LORazepam (ATIVAN) 0.5 MG tablet Take 0.5 mg by mouth 2 (two) times daily.     . Multiple Vitamin (MULTIVITAMIN) tablet Take 1 tablet by mouth daily.    . NON FORMULARY Take 1 capsule by mouth See admin instructions. Probiotic: Flora Balance O'Donnell Formula Capsules: Take 1 capsule by mouth in the morning    . zolpidem (AMBIEN) 10 MG tablet Take 5-10 mg by mouth at bedtime as needed for sleep.    Marland Kitchen ALPRAZolam (XANAX) 0.25 MG tablet Take 1-2 tabs (0.'25mg'$ -0.'50mg'$ ) 30-60 minutes before procedure. May repeat if needed.Do not drive. 4 tablet 0   Current Facility-Administered Medications  Medication Dose Route Frequency Provider Last Rate Last Admin  . gi cocktail (Maalox,Lidocaine,Donnatal)  30 mL Oral Once Copland, Gay Filler, MD        Allergies as of 05/06/2020 - Review Complete 05/06/2020  Allergen  Reaction Noted  . Naproxen sodium Anxiety 12/24/2011    Vitals: BP (!) 142/93 (BP Location: Right Arm, Patient Position: Sitting)   Pulse 80   Ht '5\' 3"'$  (1.6 m)   Wt 144 lb (65.3 kg)   BMI 25.51 kg/m  Last Weight:  Wt Readings from Last 1 Encounters:  05/06/20 144 lb (  65.3 kg)   Last Height:   Ht Readings from Last 1 Encounters:  05/06/20 '5\' 3"'$  (1.6 m)     Physical exam: Exam: Gen: NAD, conversant, well nourised, obese, well groomed                     CV: RRR, no MRG. No Carotid Bruits. No peripheral edema, warm, nontender Eyes: Conjunctivae clear without exudates or hemorrhage  Neuro: Detailed Neurologic Exam  Speech:    Speech is normal; fluent and spontaneous with normal comprehension.  Cognition:    The patient is oriented to person, place, and time;     recent and remote memory intact;     language fluent;     normal attention, concentration,     fund of knowledge Cranial Nerves:    The pupils are equal, round, and reactive to light. Could not visulize fundi due to cataracts. Visual fields are full to finger confrontation. Extraocular movements are intact. Trigeminal sensation is intact and the muscles of mastication are normal. The face is symmetric. The palate elevates in the midline. Hearing intact. Voice is normal. Shoulder shrug is normal. The tongue has normal motion without fasciculations.   Coordination:    No dysmetria or ataxia  Gait:    Heel-toe and tandem with imbalance  Motor Observation:    No asymmetry, no atrophy, and no involuntary movements noted. Tone:    Normal muscle tone.    Posture:    Posture is normal. normal erect    Strength:    Proximal muscle weakness deltoids +/5, hip flex /5     Sensation: intact to LT     Reflex Exam:  DTR's:    Deep tendon reflexes in the upper and lower extremities are brisk bilaterally.   Toes:    The toes are downgoing bilaterally.   Clonus:    Clonus 2 beats at the [place] .    Assessment/Plan:    74 y.o. female here as requested by Irene Pap DO for worsening headache.  She has a past medical history of GERD, herpes zoster without complications, hypertension,chronic pain, hyperlipidemia, restless leg syndrome, nocturia, chest tightness, dizziness.  She has brisk reflexes, limb weakness, paresthesias, clonus will need an MRI cervical spine to evaluate for myelopathy or cervical lesion.   I reviewed MRI of the brain report which was of poor quality, patient was unable to tolerate the full examination, and the images that were taken were severely motion degraded. Need MRI brain for stroke, MS (family hx of MS and myasthenia gravis)  Emg/ncs of the left arm and leg  Blood work today  Orders Placed This Encounter  Procedures  . MR CERVICAL SPINE WO CONTRAST  . MR BRAIN W WO CONTRAST  . CK  . TSH  . Acetylcholine receptor, binding  . Acetylcholine receptor, blocking  . Acetylcholine receptor, modulating  . Magnesium  . Basic Metabolic Panel  . B12 and Folate Panel  . Methylmalonic acid, serum  . Vitamin B1  . Sedimentation rate  . Heavy metals, blood  . Vitamin B6  . NCV with EMG(electromyography)   Meds ordered this encounter  Medications  . ALPRAZolam (XANAX) 0.25 MG tablet    Sig: Take 1-2 tabs (0.'25mg'$ -0.'50mg'$ ) 30-60 minutes before procedure. May repeat if needed.Do not drive.    Dispense:  4 tablet    Refill:  0    Cc: Lazoff, Shawn P, DO,  Shawn Lazoff DO  Sarina Ill, MD  [facility]  [address] [place], [place] [address]  Phone [phone] Fax [phone]

## 2020-05-06 NOTE — Telephone Encounter (Signed)
[employer] medicare order sent to [facility]. No auth they will reach out to the patient to schedule.  

## 2020-05-06 NOTE — Patient Instructions (Signed)
Blood work today Emg/ncs (see below) MRI of the cervical spine   Electromyoneurogram Electromyoneurogram is a test to check how well your muscles and nerves are working. This procedure includes the combined use of electromyogram (EMG) and nerve conduction study (NCS). EMG is used to look for muscular disorders. NCS, which is also called electroneurogram, measures how well your nerves are controlling your muscles. The procedures are usually done together to check if your muscles and nerves are healthy. If the results of the tests are abnormal, this may indicate disease or injury, such as a neuromuscular disease or peripheral nerve damage. Tell a health care provider about:  Any allergies you have.  All medicines you are taking, including vitamins, herbs, eye drops, creams, and over-the-counter medicines.  Any problems you or family members have had with anesthetic medicines.  Any blood disorders you have.  Any surgeries you have had.  Any medical conditions you have.  If you have a pacemaker.  Whether you are pregnant or may be pregnant. What are the risks? Generally, this is a safe procedure. However, problems may occur, including:  Infection where the electrodes were inserted.  Bleeding. What happens before the procedure? Medicines Ask your health care provider about:  Changing or stopping your regular medicines. This is especially important if you are taking diabetes medicines or blood thinners.  Taking medicines such as aspirin and ibuprofen. These medicines can thin your blood. Do not take these medicines unless your health care provider tells you to take them.  Taking over-the-counter medicines, vitamins, herbs, and supplements. General instructions  Your health care provider may ask you to avoid: ? Beverages that have caffeine, such as coffee and tea. ? Any products that contain nicotine or tobacco. These products include cigarettes, e-cigarettes, and chewing  tobacco. If you need help quitting, ask your health care provider.  Do not use lotions or creams on the same day that you will be having the procedure. What happens during the procedure? For EMG   Your health care provider will ask you to stay in a position so that he or she can access the muscle that will be studied. You may be standing, sitting, or lying down.  You may be given a medicine that numbs the area (local anesthetic).  A very thin needle that has an electrode will be inserted into your muscle.  Another small electrode will be placed on your skin near the muscle.  Your health care provider will ask you to continue to remain still.  The electrodes will send a signal that tells about the electrical activity of your muscles. You may see this on a monitor or hear it in the room.  After your muscles have been studied at rest, your health care provider will ask you to contract or flex your muscles. The electrodes will send a signal that tells about the electrical activity of your muscles.  Your health care provider will remove the electrodes and the electrode needles when the procedure is finished. The procedure may vary among health care providers and hospitals. For NCS   An electrode that records your nerve activity (recording electrode) will be placed on your skin by the muscle that is being studied.  An electrode that is used as a reference (reference electrode) will be placed near the recording electrode.  A paste or gel will be applied to your skin between the recording electrode and the reference electrode.  Your nerve will be stimulated with a mild shock. Your health care  provider will measure how much time it takes for your muscle to react.  Your health care provider will remove the electrodes and the gel when the procedure is finished. The procedure may vary among health care providers and hospitals. What happens after the procedure?  It is up to you to get the  results of your procedure. Ask your health care provider, or the department that is doing the procedure, when your results will be ready.  Your health care provider may: ? Give you medicines for any pain. ? Monitor the insertion sites to make sure that bleeding stops. Summary  Electromyoneurogram is a test to check how well your muscles and nerves are working.  If the results of the tests are abnormal, this may indicate disease or injury.  This is a safe procedure. However, problems may occur, such as bleeding and infection.  Your health care provider will do two tests to complete this procedure. One checks your muscles (EMG) and another checks your nerves (NCS).  It is up to you to get the results of your procedure. Ask your health care provider, or the department that is doing the procedure, when your results will be ready. This information is not intended to replace advice given to you by your health care provider. Make sure you discuss any questions you have with your health care provider. Document Revised: 07/03/2018 Document Reviewed: 06/15/2018 Elsevier Patient Education  2020 [employer].

## 2020-05-07 ENCOUNTER — Ambulatory Visit: Payer: Medicare Other | Admitting: Neurology

## 2020-05-07 ENCOUNTER — Other Ambulatory Visit: Payer: Self-pay | Admitting: *Deleted

## 2020-05-07 ENCOUNTER — Other Ambulatory Visit: Payer: Self-pay

## 2020-05-07 ENCOUNTER — Ambulatory Visit (INDEPENDENT_AMBULATORY_CARE_PROVIDER_SITE_OTHER): Payer: Medicare Other | Admitting: Neurology

## 2020-05-07 DIAGNOSIS — E538 Deficiency of other specified B group vitamins: Secondary | ICD-10-CM

## 2020-05-07 DIAGNOSIS — M6281 Muscle weakness (generalized): Secondary | ICD-10-CM

## 2020-05-07 DIAGNOSIS — Z0289 Encounter for other administrative examinations: Secondary | ICD-10-CM

## 2020-05-07 MED ORDER — CYANOCOBALAMIN 1000 MCG/ML IJ SOLN
1000.0000 ug | Freq: Once | INTRAMUSCULAR | Status: AC
  Administered 2020-05-07: 1000 ug via INTRAMUSCULAR

## 2020-05-07 MED ORDER — CYANOCOBALAMIN 1000 MCG/ML IJ SOLN: 1000.0000 ug | INTRAMUSCULAR | Status: AC

## 2020-05-07 NOTE — Progress Notes (Signed)
Full Name: Estha Few Gender: Female MRN #: [id number] Date of Birth: 09/17/1946    Visit Date: 05/07/2020 09:57 Age: 74 Years Examining Physician: Naomie Dean, MD  Referring Physician: [name] DO Primary Care Provider:  [name], MD Height: 5 feet 3 inch  History: This is a 74 year old patient who presented with multiple symptoms including ongoing weakness, anorexia, nausea, chest pain,symmetrical generalized weakness in the arms and legs, and overall not feeling well.  She is also having tingling and numbness in all the limbs.   Summary: EMG/NCS was performed on the left arm and leg. All nerves and muscles (as indicated in the following tables) were within normal limits.       Conclusion: This is a normal study. No electrophysiologic evidence for mononeuropathy, polyneuropathy, radiculopathy or myopathy/myositis.    Naomie Dean M.D.  [facility] [address] [place], [place] [address] Tel: [phone] Fax: [phone]  Verbal informed consent was obtained from the patient, patient was informed of potential risk of procedure, including bruising, bleeding, hematoma formation, infection, muscle weakness, muscle pain, numbness, among others.         MNC    Nerve / Sites Muscle Latency Ref. Amplitude Ref. Rel Amp Segments Distance Velocity Ref. Area    ms ms mV mV %  cm m/s m/s mVms  L Median - APB     Wrist APB 2.6 ?4.4 6.9 ?4.0 100 Wrist - APB 7   20.6     Upper arm APB 6.1  5.7  83.5 Upper arm - Wrist 21 60 ?49 17.3  L Ulnar - ADM     Wrist ADM 2.4 ?3.3 13.0 ?6.0 100 Wrist - ADM 7   28.8     B.Elbow ADM 5.4  11.5  89 B.Elbow - Wrist 19 65 ?49 28.0     A.Elbow ADM 7.0  11.0  95 A.Elbow - B.Elbow 10 62 ?49 27.9         A.Elbow - Wrist      L Peroneal - EDB     Ankle EDB 4.5 ?6.5 5.0 ?2.0 100 Ankle - EDB 9   13.8     Fib head EDB 9.6  4.4  87.9 Fib head - Ankle 25 48 ?44 13.0     Pop fossa EDB 11.4  4.4  98.4 Pop fossa - Fib head  10 55 ?44 13.5         Pop fossa - Ankle      L Tibial - AH     Ankle AH 3.6 ?5.8 5.8 ?4.0 100 Ankle - AH 9   15.1     Pop fossa AH 11.4  4.3  73.6 Pop fossa - Ankle 34 44 ?41 14.5             SNC    Nerve / Sites Rec. Site Peak Lat Ref.  Amp Ref. Segments Distance Peak Diff Ref.    ms ms V V  cm ms ms  L Sural - Ankle (Calf)     Calf Ankle 4.2 ?4.4 8 ?6 Calf - Ankle 14    L Superficial peroneal - Ankle     Lat leg Ankle 4.2 ?4.4 7 ?6 Lat leg - Ankle 14    L Median, Ulnar - Transcarpal comparison     Median Palm Wrist 1.9 ?2.2 82 ?35 Median Palm - Wrist 8       Ulnar Palm Wrist 2.0 ?2.2 17 ?12 Ulnar  Palm - Wrist 8          Median Palm - Ulnar Palm  -0.1 ?0.4  L Median - Orthodromic (Dig II, Mid palm)     Dig II Wrist 2.9 ?3.4 17 ?10 Dig II - Wrist 13    L Ulnar - Orthodromic, (Dig V, Mid palm)     Dig V Wrist 2.7 ?3.1 7 ?5 Dig V - Wrist 11                 F  Wave    Nerve F Lat Ref.   ms ms  L Tibial - AH 41.1 ?56.0  L Ulnar - ADM 25.2 ?32.0         EMG Summary Table    Spontaneous MUAP Recruitment  Muscle IA Fib PSW Fasc Other Amp Dur. Poly Pattern  L. Cervical paraspinals (low) Normal None None None _______ Normal Normal Normal Normal  L. Deltoid Normal None None None _______ Normal Normal Normal Normal  L. Triceps brachii Normal None None None _______ Normal Normal Normal Normal  L. Pronator teres Normal None None None _______ Normal Normal Normal Normal  L. Biceps brachii Normal None None None _______ Normal Normal Normal Normal  L. First dorsal interosseous Normal None None None _______ Normal Normal Normal Normal  L. Iliopsoas Normal None None None _______ Normal Normal Normal Normal  L. Vastus medialis Normal None None None _______ Normal Normal Normal Normal  L. Tibialis anterior Normal None None None _______ Normal Normal Normal Normal  L. Gastrocnemius (Medial head) Normal None None None _______ Normal Normal Normal Normal  L. Extensor hallucis longus Normal  None None None _______ Normal Normal Normal Normal  L. Lumbar paraspinals (mid) Normal None None None _______ Normal Normal Normal Normal

## 2020-05-07 NOTE — Progress Notes (Signed)
See procedure note.

## 2020-05-07 NOTE — Progress Notes (Signed)
Pt given 1st B12 injection. Per [name], needs weekly x 4. Today pt was given 1000 mcg IM in L deltoid. Aseptic technique used. Pt tolerated well. Bandaid applied. Pt aware she can have PCP do these injections if she prefers. For now, she decided to continue them here and we have already scheduled her next three injections for 7/15 10:45 AM, 7/22 10:45 AM, and 7/29 11:15 AM. Will need to clarify further injection needs with [name] by the time the weekly injections are complete.

## 2020-05-07 NOTE — Procedures (Signed)
° ° ° °   °Full Name: Kayla Terry Gender: Female °MRN #: [id number] Date of Birth: 03/12/1946 °   °Visit Date: 05/07/2020 09:57 °Age: 74 Years °Examining Physician: Brookley Spitler, MD  °Referring Physician: Shawn Lazoff DO °Primary Care Provider:  Wilson, Fred H, MD °Height: 5 feet 3 inch ° °History: This is a 74-year-old patient who presented with multiple symptoms including ongoing weakness, anorexia, nausea, chest pain,symmetrical generalized weakness in the arms and legs, and overall not feeling well.  She is also having tingling and numbness in all the limbs.  ° °Summary: EMG/NCS was performed on the left arm and leg. All nerves and muscles (as indicated in the following tables) were within normal limits.   ° °   °Conclusion: This is a normal study. No electrophysiologic evidence for mononeuropathy, polyneuropathy, radiculopathy or myopathy/myositis. ° ° ° °[name] M.D. ° °[facility] °[address] °[place], [place] [address] °Tel: [phone] °Fax: [phone] ° °Verbal informed consent was obtained from the patient, patient was informed of potential risk of procedure, including bruising, bleeding, hematoma formation, infection, muscle weakness, muscle pain, numbness, among others.  °   ° °   °MNC °   °Nerve / Sites Muscle Latency Ref. Amplitude Ref. Rel Amp Segments Distance Velocity Ref. Area  °  ms ms mV mV %  cm m/s m/s mVms  °L Median - APB  °   Wrist APB 2.6 ?4.4 6.9 ?4.0 100 Wrist - APB 7   20.6  °   Upper arm APB 6.1  5.7  83.5 Upper arm - Wrist 21 60 ?49 17.3  °L Ulnar - ADM  °   Wrist ADM 2.4 ?3.3 13.0 ?6.0 100 Wrist - ADM 7   28.8  °   B.Elbow ADM 5.4  11.5  89 B.Elbow - Wrist 19 65 ?49 28.0  °   A.Elbow ADM 7.0  11.0  95 A.Elbow - B.Elbow 10 62 ?49 27.9  °       A.Elbow - Wrist      °L Peroneal - EDB  °   Ankle EDB 4.5 ?6.5 5.0 ?2.0 100 Ankle - EDB 9   13.8  °   Fib head EDB 9.6  4.4  87.9 Fib head - Ankle 25 48 ?44 13.0  °   Pop fossa EDB 11.4  4.4  98.4 Pop fossa - Fib head  10 55 ?44 13.5  °       Pop fossa - Ankle      °L Tibial - AH  °   Ankle AH 3.6 ?5.8 5.8 ?4.0 100 Ankle - AH 9   15.1  °   Pop fossa AH 11.4  4.3  73.6 Pop fossa - Ankle 34 44 ?41 14.5  °           °SNC °   °Nerve / Sites Rec. Site Peak Lat Ref.  Amp Ref. Segments Distance Peak Diff Ref.  °  ms ms µV µV  cm ms ms  °L Sural - Ankle (Calf)  °   Calf Ankle 4.2 ?4.4 8 ?6 Calf - Ankle 14    °L Superficial peroneal - Ankle  °   Lat leg Ankle 4.2 ?4.4 7 ?6 Lat leg - Ankle 14    °L Median, Ulnar - Transcarpal comparison  °   Median Palm Wrist 1.9 ?2.2 82 ?35 Median Palm - Wrist 8    °   Ulnar Palm Wrist 2.0 ?2.2 17 ?12 Ulnar   Palm - Wrist 8    °      Median Palm - Ulnar Palm  -0.1 ?0.4  °L Median - Orthodromic (Dig II, Mid palm)  °   Dig II Wrist 2.9 ?3.4 17 ?10 Dig II - Wrist 13    °L Ulnar - Orthodromic, (Dig V, Mid palm)  °   Dig V Wrist 2.7 ?3.1 7 ?5 Dig V - Wrist 11    °             °F  Wave °   °Nerve F Lat Ref.  ° ms ms  °L Tibial - AH 41.1 ?56.0  °L Ulnar - ADM 25.2 ?32.0  °       °EMG Summary Table   ° Spontaneous MUAP Recruitment  °Muscle IA Fib PSW Fasc Other Amp Dur. Poly Pattern  °L. Cervical paraspinals (low) Normal None None None _______ Normal Normal Normal Normal  °L. Deltoid Normal None None None _______ Normal Normal Normal Normal  °L. Triceps brachii Normal None None None _______ Normal Normal Normal Normal  °L. Pronator teres Normal None None None _______ Normal Normal Normal Normal  °L. Biceps brachii Normal None None None _______ Normal Normal Normal Normal  °L. First dorsal interosseous Normal None None None _______ Normal Normal Normal Normal  °L. Iliopsoas Normal None None None _______ Normal Normal Normal Normal  °L. Vastus medialis Normal None None None _______ Normal Normal Normal Normal  °L. Tibialis anterior Normal None None None _______ Normal Normal Normal Normal  °L. Gastrocnemius (Medial head) Normal None None None _______ Normal Normal Normal Normal  °L. Extensor hallucis longus Normal  None None None _______ Normal Normal Normal Normal  °L. Lumbar paraspinals (mid) Normal None None None _______ Normal Normal Normal Normal  ° °  ° °

## 2020-05-07 NOTE — Progress Notes (Signed)
This is a 74 year old patient who presented with multiple symptoms including ongoing weakness, anorexia, nausea, chest pain, generalized weakness in the arms and legs, and overall not feeling well.  She is also having tingling.  Today we performed an EMG nerve conduction study to evaluate for nerve or muscle disease which was normal.  However during lab testing I did find B12 deficiency which can cause a host of symptoms including weakness, fatigue, nausea, GERD, weight loss, numbness and tingling.  Patient does take medications for GERD which can impair B12 absorption however unclear etiology of her B12 deficiency.  We will start injections today and see if this helps any of her symptoms.  Still pending multiple labs and MRI of the cervical spine to check for myelopathy.   b12 shots weekly for 4 weeks and then once a month for 6 months. Follow up with primary care for B12 deficiency.B12 deficiency can cause weakness, fatigue, easy bruising or bleeding,sore tongue, stomach upset, weight loss, and diarrhea or constipation, tingling or numbness to the fingers and toes, difficulty walking, mood changes, depression, memory loss, disorientation and, in severe cases, dementia. After injections recommend 1000-2000 mcg B12 daily.    I spent 15  minutes of face-to-face and non-face-to-face time with patient on the No diagnosis found. diagnosis.  This included previsit chart review, lab review, study review, order entry, electronic health record documentation, patient education on the different diagnostic and therapeutic options, counseling and coordination of care, risks and benefits of management, compliance, or risk factor reduction This does not include time spent on emg/ncs.

## 2020-05-07 NOTE — Patient Instructions (Signed)
Vitamin B12 Deficiency Vitamin B12 deficiency occurs when the body does not have enough vitamin B12, which is an important vitamin. The body needs this vitamin:  To make red blood cells.  To make DNA. This is the genetic material inside cells.  To help the nerves work properly so they can carry messages from the brain to the body. Vitamin B12 deficiency can cause various health problems, such as a low red blood cell count (anemia) or nerve damage. What are the causes? This condition may be caused by:  Not eating enough foods that contain vitamin B12.  Not having enough stomach acid and digestive fluids to properly absorb vitamin B12 from the food that you eat.  Certain digestive system diseases that make it hard to absorb vitamin B12. These diseases include Crohn's disease, chronic pancreatitis, and cystic fibrosis.  A condition in which the body does not make enough of a protein (intrinsic factor), resulting in too few red blood cells (pernicious anemia).  Having a surgery in which part of the stomach or small intestine is removed.  Taking certain medicines that make it hard for the body to absorb vitamin B12. These medicines include: ? Heartburn medicines (antacids and proton pump inhibitors). ? Certain antibiotic medicines. ? Some medicines that are used to treat diabetes, tuberculosis, gout, or high cholesterol. What increases the risk? The following factors may make you more likely to develop a B12 deficiency:  Being older than age 50.  Eating a vegetarian or vegan diet, especially while you are pregnant.  Eating a poor diet while you are pregnant.  Taking certain medicines.  Having alcoholism. What are the signs or symptoms? In some cases, there are no symptoms of this condition. If the condition leads to anemia or nerve damage, various symptoms can occur, such as:  Weakness.  Fatigue.  Loss of appetite.  Weight loss.  Numbness or tingling in your hands and  feet.  Redness and burning of the tongue.  Confusion or memory problems.  Depression.  Sensory problems, such as color blindness, ringing in the ears, or loss of taste.  Diarrhea or constipation.  Trouble walking. If anemia is severe, symptoms can include:  Shortness of breath.  Dizziness.  Rapid heart rate (tachycardia). How is this diagnosed? This condition may be diagnosed with a blood test to measure the level of vitamin B12 in your blood. You may also have other tests, including:  A group of tests that measure certain characteristics of blood cells (complete blood count, CBC).  A blood test to measure intrinsic factor.  A procedure where a thin tube with a camera on the end is used to look into your stomach or intestines (endoscopy). Other tests may be needed to discover the cause of B12 deficiency. How is this treated? Treatment for this condition depends on the cause. This condition may be treated by:  Changing your eating and drinking habits, such as: ? Eating more foods that contain vitamin B12. ? Drinking less alcohol or no alcohol.  Getting vitamin B12 injections.  Taking vitamin B12 supplements. Your health care provider will tell you which dosage is best for you. Follow these instructions at home: Eating and drinking   Eat lots of healthy foods that contain vitamin B12, including: ? Meats and poultry. This includes beef, pork, chicken, turkey, and organ meats, such as liver. ? Seafood. This includes clams, rainbow trout, salmon, tuna, and haddock. ? Eggs. ? Cereal and dairy products that are fortified. This means that vitamin B12   has been added to the food. Check the label on the package to see if the food is fortified. The items listed above may not be a complete list of recommended foods and beverages. Contact a dietitian for more information. General instructions  Get any injections that are prescribed by your health care provider.  Take  supplements only as told by your health care provider. Follow the directions carefully.  Do not drink alcohol if your health care provider tells you not to. In some cases, you may only be asked to limit alcohol use.  Keep all follow-up visits as told by your health care provider. This is important. Contact a health care provider if:  Your symptoms come back. Get help right away if you:  Develop shortness of breath.  Have a rapid heart rate.  Have chest pain.  Become dizzy or lose consciousness. Summary  Vitamin B12 deficiency occurs when the body does not have enough vitamin B12.  The main causes of vitamin B12 deficiency include dietary deficiency, digestive diseases, pernicious anemia, and having a surgery in which part of the stomach or small intestine is removed.  In some cases, there are no symptoms of this condition. If the condition leads to anemia or nerve damage, various symptoms can occur, such as weakness, shortness of breath, and numbness.  Treatment may include getting vitamin B12 injections or taking vitamin B12 supplements. Eat lots of healthy foods that contain vitamin B12. This information is not intended to replace advice given to you by your health care provider. Make sure you discuss any questions you have with your health care provider. Document Revised: 04/05/2019 Document Reviewed: 06/26/2018 Elsevier Patient Education  2020 [employer]  

## 2020-05-08 ENCOUNTER — Ambulatory Visit
Admission: RE | Admit: 2020-05-08 | Discharge: 2020-05-08 | Disposition: A | Discharge status: Home / Self Care | Payer: Medicare Other | Source: Ambulatory Visit | Attending: Family Medicine | Admitting: Family Medicine

## 2020-05-08 ENCOUNTER — Other Ambulatory Visit: Payer: Self-pay

## 2020-05-08 ENCOUNTER — Telehealth: Payer: Self-pay | Admitting: Physician Assistant

## 2020-05-08 DIAGNOSIS — Z1231 Encounter for screening mammogram for malignant neoplasm of breast: Secondary | ICD-10-CM

## 2020-05-08 NOTE — Telephone Encounter (Signed)
Patient is calling states she saw a urologist yesterday and she has some things to go over with you. They did blood work and her B12 is low 218 and they gave her a shot. At night she had a bad headache was nauseous and dizzy she took prevacid but she is not sure if it was because it was too much b12 or should she had not taken the prevacid please advise.

## 2020-05-11 ENCOUNTER — Telehealth: Payer: Self-pay

## 2020-05-11 LAB — BASIC METABOLIC PANEL
BUN/Creatinine Ratio: 9 — ABNORMAL LOW (ref 12–28)
BUN: 9 mg/dL (ref 8–27)
CO2: 25 mmol/L (ref 20–29)
Calcium: 10.2 mg/dL (ref 8.7–10.3)
Chloride: 99 mmol/L (ref 96–106)
Creatinine, Ser: 0.95 mg/dL (ref 0.57–1.00)
GFR calc Af Amer: 68 mL/min/{1.73_m2} (ref >59)
GFR calc non Af Amer: 59 mL/min/{1.73_m2} — ABNORMAL LOW (ref >59)
Glucose: 95 mg/dL (ref 65–99)
Potassium: 4.2 mmol/L (ref 3.5–5.2)
Sodium: 140 mmol/L (ref 134–144)

## 2020-05-11 LAB — TSH: TSH: 2.4 u[IU]/mL (ref 0.450–4.500)

## 2020-05-11 LAB — HEAVY METALS, BLOOD
Arsenic: 7 ug/L (ref 2–23)
Lead, Blood: <1 ug/dL (ref 0–4)
Mercury: <1 ug/L (ref 0.0–14.9)

## 2020-05-11 LAB — B12 AND FOLATE PANEL
Folate: 19.4 ng/mL (ref >3.0)
Vitamin B-12: 218 pg/mL — ABNORMAL LOW (ref 232–1245)

## 2020-05-11 LAB — METHYLMALONIC ACID, SERUM: Methylmalonic Acid: 801 nmol/L — ABNORMAL HIGH (ref 0–378)

## 2020-05-11 LAB — ACETYLCHOLINE RECEPTOR, BINDING: AChR Binding Ab, Serum: <0.03 nmol/L (ref 0.00–0.24)

## 2020-05-11 LAB — ACETYLCHOLINE RECEPTOR, BLOCKING: Acetylchol Block Ab: 19 % (ref 0–25)

## 2020-05-11 LAB — SEDIMENTATION RATE: Sed Rate: 8 mm/hr (ref 0–40)

## 2020-05-11 LAB — VITAMIN B1: Thiamine: 144.9 nmol/L (ref 66.5–200.0)

## 2020-05-11 LAB — CK: Total CK: 52 U/L (ref 32–182)

## 2020-05-11 LAB — VITAMIN B6: Vitamin B6: 14 ug/L (ref 2.0–32.8)

## 2020-05-11 LAB — ACETYLCHOLINE RECEPTOR, MODULATING: Acetylcholine Modulat Ab: <12 % (ref 0–20)

## 2020-05-11 LAB — MAGNESIUM: Magnesium: 2.2 mg/dL (ref 1.6–2.3)

## 2020-05-11 NOTE — Telephone Encounter (Signed)
Pt left a VM asking for a call back to discuss vitmain B12 injections.

## 2020-05-11 NOTE — Telephone Encounter (Signed)
Called patient three times, line did not ring, only a busy tone. Unable to leave a message.

## 2020-05-11 NOTE — Telephone Encounter (Addendum)
Spoke with [name]. Unaware of B12 injection causing migraine. Advise pt to take Tylenol 1000 mg before her next B12 injection on 05/14/20 and if she gets another migraine headache we may need to pre-treat with Toradol in future.   I spoke with the pt. She said the day she got the injection she had felt nauseated (she sees GI). Later that afternoon she developed a migraine. She said in all her life she's maybe had 1 migraine. The headache she had the other day was bad and she wasn't sure if it was a migraine. Excedrin did not help. She was dizzy, nauseated. Dizziness didn't last that long with lying down. She is better today no dizziness or headache, and she will plan to pre-treat with Tylenol before her injection this Thursday 7/15. Still dealing with the nausea. Her questions were answered during the call.

## 2020-05-11 NOTE — Telephone Encounter (Signed)
Pt has called to inform RN that when she had her last B12 later that afternoon she had a migraine until yesterday.  Please call

## 2020-05-11 NOTE — Telephone Encounter (Signed)
I called the pt and LVM advising I was returning her call and left office number with office hours for pt to call back. If pt calls back and nurse is unavailable, please assist with finding out what questions she has. We have her scheduled for her second B12 injection this Thursday 7/15 @ 10:45 AM.

## 2020-05-12 NOTE — Telephone Encounter (Signed)
Spoke with the patient. She states her frustration because she does not have answers to the cause of her symptoms. She has spoken with the provider office that gave the B12 as well as her PCP. She has imaging of the brain scheduled. She has medication for nausea.  Nothing new to add at this time. Keep the EGD as scheduled.

## 2020-05-13 ENCOUNTER — Telehealth: Payer: Self-pay | Admitting: Physician Assistant

## 2020-05-13 NOTE — Telephone Encounter (Signed)
Patient aware.

## 2020-05-13 NOTE — Telephone Encounter (Signed)
I called the pt and discussed Dr. Trevor Mace message below. She verbalized understanding and stated she has a PCP appt tomorrow. For now she will keep the injection appt with [place] tomorrow but if she changes her mind she will call in the AM. She is aware that long term her B12 will need to be managed by PCP. Pt had several questions which were answered. I did advise she discuss with her PCP reasons why she may be B12 deficient (per [name]) and also discuss her nausea w/ PCP and GI as [name] cannot say if the B12 has increased her nausea. Pt verbalized understanding and appreciation for the call.

## 2020-05-13 NOTE — Telephone Encounter (Signed)
Phone rep checked office voicemail's, pt is asking for a call from RN to discuss her B12 injection appointment, pt is sick and has concerns.

## 2020-05-13 NOTE — Telephone Encounter (Signed)
Doc of the Day Dr Adela Lank patient seen by Mike Gip, PA-C on 04/23/20 for nausea and vomiting. Spoke with the patient. She vomited around 2 am and again at 11:00 (approximately).  Complains of headache, feeling very tired and very nauseated. She last took Lorazepam at noon. She is sipping water and tolerating. No solid foods. She will take Ondansetron now. Switch to sipping Pedialyte. Goal is 1/2 to 1 cup per hour as tolerated. Follow the instructions for repeating Lorazepam.  Declines ED visit, but agrees if she acutely worsens or fails to improve she will consider this. Pre-visit tomorrow morning 8:00am. EGD 05/19/20 at 8:00am. Is this okay?

## 2020-05-13 NOTE — Telephone Encounter (Signed)
I returned the pt's call. She reports she is still nauseated. She vomited this AM. She has been able to tolerate water. She is afraid to eat right now. She already called her GI doctor which is what I recommended now and she is waiting to hear back from them. She felt the nausea flared back up after the B12 shot. I let her know the headache she had may have had some associated nausea but that I was unaware nor did our medication source say that nausea is a side effect of B12 supplementation. Pt would like to call us tomorrow AM and let us know how she feels since she is supposed to have another B12 injection tomorrow. She also wants to know what [name] thinks about this and would like a call back.

## 2020-05-13 NOTE — Telephone Encounter (Signed)
Yes OK as outlined. Please call back tomorrow if not improving.

## 2020-05-13 NOTE — Telephone Encounter (Signed)
Patient has a history of ongoing nausea and is following with GI, I can't tell her if the B12 icreased this I would follow up with primary care as they will managing her B12 deficiency. If she doesn't want to get another injection she can switch to daily of B12 daily and then recheck B12 in 8-12 weeks

## 2020-05-14 ENCOUNTER — Encounter: Payer: Self-pay | Admitting: Gastroenterology

## 2020-05-14 ENCOUNTER — Ambulatory Visit: Payer: Medicare Other

## 2020-05-14 ENCOUNTER — Ambulatory Visit (AMBULATORY_SURGERY_CENTER): Payer: Medicare Other | Admitting: *Deleted

## 2020-05-14 ENCOUNTER — Ambulatory Visit: Payer: Self-pay

## 2020-05-14 VITALS — Ht 63.0 in | Wt 145.0 lb

## 2020-05-14 DIAGNOSIS — R112 Nausea with vomiting, unspecified: Secondary | ICD-10-CM

## 2020-05-14 DIAGNOSIS — Z1159 Encounter for screening for other viral diseases: Secondary | ICD-10-CM

## 2020-05-14 DIAGNOSIS — Z01818 Encounter for other preprocedural examination: Secondary | ICD-10-CM

## 2020-05-14 NOTE — Progress Notes (Signed)
PV was over the phone. No egg or soy allergy known to patient  No issues with past sedation with any surgeries or procedures no intubation problems in the past  No diet pills per patient No home 02 use per patient  No blood thinners per patient  Pt denies issues with constipation  No A fib or A flutter  EMMI video to pt or MyChart  COVID 19 guidelines implemented in [facility] today    Due to the COVID-19 pandemic we are asking patients to follow these guidelines. Please only bring one care partner. Please be aware that your care partner may wait in the car in the parking lot or if they feel like they will be too hot to wait in the car, they may wait in the lobby on the 4th floor. All care partners are required to wear a mask the entire time (we do not have any that we can provide them), they need to practice social distancing, and we will do a Covid check for all patient's and care partners when you arrive. Also we will check their temperature and your temperature. If the care partner waits in their car they need to stay in the parking lot the entire time and we will call them on their cell phone when the patient is ready for discharge so they can bring the car to the front of the building. Also all patient's will need to wear a mask into building.

## 2020-05-14 NOTE — Telephone Encounter (Signed)
Pt called and left VM regarding her canceled B12 appt and is needing to speak to RN. Please advise.

## 2020-05-14 NOTE — Telephone Encounter (Signed)
Spoke with patient who stated she didn't see PCP today but had pre op for endoscopy next Tues instead. She stated she would like to take OTC B12 instead of injections.  She will get B12 level rechecked in 2-3 months. She wrote down B12 dose Dr Lucia Gaskins recommended. Patient verbalized understanding, appreciation of call back. Future B12 injection apts canceled.

## 2020-05-15 LAB — SARS CORONAVIRUS 2 (TAT 6-24 HRS): SARS Coronavirus 2: NEGATIVE

## 2020-05-18 ENCOUNTER — Ambulatory Visit: Payer: Medicare Other | Admitting: Neurology

## 2020-05-19 ENCOUNTER — Encounter: Payer: Self-pay | Admitting: Gastroenterology

## 2020-05-19 ENCOUNTER — Other Ambulatory Visit: Payer: Self-pay

## 2020-05-19 ENCOUNTER — Ambulatory Visit (AMBULATORY_SURGERY_CENTER): Payer: Medicare Other | Admitting: Gastroenterology

## 2020-05-19 VITALS — BP 155/90 | HR 59 | Temp 96.9°F | Resp 18 | Ht 63.0 in | Wt 145.0 lb

## 2020-05-19 DIAGNOSIS — R112 Nausea with vomiting, unspecified: Secondary | ICD-10-CM

## 2020-05-19 DIAGNOSIS — K319 Disease of stomach and duodenum, unspecified: Secondary | ICD-10-CM

## 2020-05-19 DIAGNOSIS — K3189 Other diseases of stomach and duodenum: Secondary | ICD-10-CM

## 2020-05-19 DIAGNOSIS — K449 Diaphragmatic hernia without obstruction or gangrene: Secondary | ICD-10-CM

## 2020-05-19 DIAGNOSIS — R630 Anorexia: Secondary | ICD-10-CM

## 2020-05-19 DIAGNOSIS — K219 Gastro-esophageal reflux disease without esophagitis: Secondary | ICD-10-CM | POA: Diagnosis not present

## 2020-05-19 DIAGNOSIS — R1010 Upper abdominal pain, unspecified: Secondary | ICD-10-CM

## 2020-05-19 MED ORDER — SODIUM CHLORIDE 0.9 % IV SOLN: 500.0000 mL | Freq: Once | INTRAVENOUS | Status: DC

## 2020-05-19 NOTE — Progress Notes (Signed)
Pt's states no medical or surgical changes since previsit or office visit. 

## 2020-05-19 NOTE — Progress Notes (Signed)
VS done by CW 

## 2020-05-19 NOTE — Op Note (Signed)
[facility] Patient Name: Kayla Terry Procedure Date: 05/19/2020 7:50 AM MRN: [id number] Endoscopist: Viviann Spare P. Adela Lank , MD Age: 74 Referring MD:  Date of Birth: 30-Oct-1946 Gender: Female Account #: [id number] Procedure:                Upper GI endoscopy Indications:              Nausea with vomiting, intermittent abdominal pain,                            history of B12 defiency, on Prevacid for GERD, also                            with history of headache / dizziness followed by                            Neurology, MRI brain pending Medicines:                Monitored Anesthesia Care Procedure:                Pre-Anesthesia Assessment:                           - Prior to the procedure, a History and Physical                            was performed, and patient medications and                            allergies were reviewed. The patient's tolerance of                            previous anesthesia was also reviewed. The risks                            and benefits of the procedure and the sedation                            options and risks were discussed with the patient.                            All questions were answered, and informed consent                            was obtained. Prior Anticoagulants: The patient has                            taken no previous anticoagulant or antiplatelet                            agents. ASA Grade Assessment: II - A patient with                            mild systemic disease. After reviewing the risks  and benefits, the patient was deemed in                            satisfactory condition to undergo the procedure.                           After obtaining informed consent, the endoscope was                            passed under direct vision. Throughout the                            procedure, the patient's blood pressure, pulse, and                            oxygen saturations  were monitored continuously. The                            Endoscope was introduced through the mouth, and                            advanced to the second part of duodenum. The upper                            GI endoscopy was accomplished without difficulty.                            The patient tolerated the procedure well. Scope In: Scope Out: Findings:                 Esophagogastric landmarks were identified: the                            Z-line was found at 38 cm, the gastroesophageal                            junction was found at 38 cm and the upper extent of                            the gastric folds was found at 39 cm from the                            incisors.                           A 1 cm hiatal hernia was present.                           The exam of the esophagus was otherwise normal.                           The entire examined stomach was normal. Biopsies  were taken with a cold forceps from antrum,                            incisura, body for Helicobacter pylori testing.                           The duodenal bulb and second portion of the                            duodenum were normal. Biopsies for histology were                            taken with a cold forceps for evaluation of celiac                            disease. Complications:            No immediate complications. Estimated blood loss:                            Minimal. Estimated Blood Loss:     Estimated blood loss was minimal. Impression:               - Esophagogastric landmarks identified.                           - 1 cm hiatal hernia.                           - Normal esophagus otherwise.                           - Normal stomach. Biopsied.                           - Normal duodenal bulb and second portion of the                            duodenum. Biopsied.                           No overt cause for symptoms on this exam, will                             await biopsy results. Agree with MRI brain and                            Neurology workup for headache / dizziness which                            could be related to nausea / vomiting. Recommendation:           - Patient has a contact number available for                            emergencies. The signs and symptoms of potential  delayed complications were discussed with the                            patient. Return to normal activities tomorrow.                            Written discharge instructions were provided to the                            patient.                           - Resume previous diet.                           - Continue present medications.                           - Await pathology results and MRI brain with                            further recommendations. Viviann Spare P. Wanette Robison, MD 05/19/2020 8:17:51 AM This report has been signed electronically.

## 2020-05-19 NOTE — Progress Notes (Signed)
Called to room to assist during endoscopic procedure.  Patient ID and intended procedure confirmed with present staff. Received instructions for my participation in the procedure from the performing physician.  

## 2020-05-19 NOTE — Patient Instructions (Signed)
Await biopsy results  Handout on Hiatal hernia and GERD given to you today   YOU HAD AN ENDOSCOPIC PROCEDURE TODAY AT THE [facility]:   Refer to the procedure report that was given to you for any specific questions about what was found during the examination.  If the procedure report does not answer your questions, please call your gastroenterologist to clarify.  If you requested that your care partner not be given the details of your procedure findings, then the procedure report has been included in a sealed envelope for you to review at your convenience later.  YOU SHOULD EXPECT: Some feelings of bloating in the abdomen. Passage of more gas than usual.  Walking can help get rid of the air that was put into your GI tract during the procedure and reduce the bloating. If you had a lower endoscopy (such as a colonoscopy or flexible sigmoidoscopy) you may notice spotting of blood in your stool or on the toilet paper. If you underwent a bowel prep for your procedure, you may not have a normal bowel movement for a few days.  Please Note:  You might notice some irritation and congestion in your nose or some drainage.  This is from the oxygen used during your procedure.  There is no need for concern and it should clear up in a day or so.  SYMPTOMS TO REPORT IMMEDIATELY:     Following upper endoscopy (EGD)  Vomiting of blood or coffee ground material  New chest pain or pain under the shoulder blades  Painful or persistently difficult swallowing  New shortness of breath  Fever of 100F or higher  Black, tarry-looking stools  For urgent or emergent issues, a gastroenterologist can be reached at any hour by calling [phone]. Do not use MyChart messaging for urgent concerns.    DIET:  We do recommend a small meal at first, but then you may proceed to your regular diet.  Drink plenty of fluids but you should avoid alcoholic beverages for 24 hours.  ACTIVITY:  You should plan  to take it easy for the rest of today and you should NOT DRIVE or use heavy machinery until tomorrow (because of the sedation medicines used during the test).    FOLLOW UP: Our staff will call the number listed on your records 48-72 hours following your procedure to check on you and address any questions or concerns that you may have regarding the information given to you following your procedure. If we do not reach you, we will leave a message.  We will attempt to reach you two times.  During this call, we will ask if you have developed any symptoms of COVID 19. If you develop any symptoms (ie: fever, flu-like symptoms, shortness of breath, cough etc.) before then, please call [phone].  If you test positive for Covid 19 in the 2 weeks post procedure, please call and report this information to [place].    If any biopsies were taken you will be contacted by phone or by letter within the next 1-3 weeks.  Please call us at [phone] if you have not heard about the biopsies in 3 weeks.    SIGNATURES/CONFIDENTIALITY: You and/or your care partner have signed paperwork which will be entered into your electronic medical record.  These signatures attest to the fact that that the information above on your After Visit Summary has been reviewed and is understood.  Full responsibility of the confidentiality of this discharge information lies  with you and/or your care-partner.

## 2020-05-19 NOTE — Progress Notes (Signed)
pt tolerated well. VSS. awake and to recovery. Report given to RN. Bite block inserted and removed without trauma. 

## 2020-05-20 ENCOUNTER — Telehealth: Payer: Self-pay | Admitting: Gastroenterology

## 2020-05-20 NOTE — Telephone Encounter (Signed)
Okay to take the prevacid every day.  Okay to take the B12 oral supplement as well. She will need to have B12 levels rechecked in a few months but if she does not improve with oral supplement will need IM.

## 2020-05-20 NOTE — Telephone Encounter (Signed)
Patient has questions regarding pepcid medication

## 2020-05-20 NOTE — Telephone Encounter (Signed)
Left message on machine to call back  

## 2020-05-20 NOTE — Telephone Encounter (Signed)
Spoke with patient, patient is taking OTC prevacid 15 mg once a day, pt states that on the bottle it states not to take past 14 days and then to wait 45 days until she can resume it. Please advise on any other recommendations for patient or can she continue taking this daily? Thank you

## 2020-05-20 NOTE — Telephone Encounter (Signed)
Spoke with patient, pt states that a few weeks ago she saw a neurologist, was told that she was B12 deficient and they prescribed her B12 tablets, patient is concerned that taking Prevacid will cause issues with B12 absorption. Advised patient to contact pharmacy regarding mediation interactions, they told her to discuss that with the provider and I advised that she discuss with provider who prescribed B12, she stated that she had spoken with them as well and they told her to discuss with PCP. Any recommendations regarding this?

## 2020-05-21 ENCOUNTER — Telehealth: Payer: Self-pay

## 2020-05-21 ENCOUNTER — Ambulatory Visit: Payer: Self-pay

## 2020-05-21 NOTE — Telephone Encounter (Signed)
  Follow up Call-  Call back number 05/19/2020  Post procedure Call Back phone  # [phone]  Permission to leave phone message Yes  Some recent data might be hidden     Left message

## 2020-05-21 NOTE — Telephone Encounter (Signed)
Lm on vm for patient to return call 

## 2020-05-21 NOTE — Telephone Encounter (Signed)
Left message on 2nd follow up call. 

## 2020-05-21 NOTE — Telephone Encounter (Signed)
Spoke with patient, pt advised that she can continue prevacid and b12 oral supplements daily. Pt reports "feeling hungry" even though she had just eaten like an hour ago, pt denies any abdominal pain or abdominal cramping. Pt states that she gets this feeling often, pt advised to try eating 6 small meals a day vs 3 large meals.

## 2020-05-21 NOTE — Telephone Encounter (Signed)
Patient is returning your call.  

## 2020-05-22 ENCOUNTER — Other Ambulatory Visit: Payer: Self-pay

## 2020-05-22 ENCOUNTER — Telehealth: Payer: Self-pay | Admitting: Gastroenterology

## 2020-05-22 ENCOUNTER — Encounter (HOSPITAL_COMMUNITY): Payer: Self-pay | Admitting: Emergency Medicine

## 2020-05-22 ENCOUNTER — Emergency Department (HOSPITAL_COMMUNITY)
Admission: EM | Admit: 2020-05-22 | Discharge: 2020-05-22 | Disposition: A | Discharge status: Home / Self Care | Payer: Medicare Other | Attending: Emergency Medicine | Admitting: Emergency Medicine

## 2020-05-22 DIAGNOSIS — I10 Essential (primary) hypertension: Secondary | ICD-10-CM | POA: Diagnosis not present

## 2020-05-22 DIAGNOSIS — Z7982 Long term (current) use of aspirin: Secondary | ICD-10-CM | POA: Insufficient documentation

## 2020-05-22 DIAGNOSIS — M791 Myalgia, unspecified site: Secondary | ICD-10-CM | POA: Insufficient documentation

## 2020-05-22 DIAGNOSIS — Z79899 Other long term (current) drug therapy: Secondary | ICD-10-CM | POA: Diagnosis not present

## 2020-05-22 DIAGNOSIS — G4489 Other headache syndrome: Secondary | ICD-10-CM | POA: Insufficient documentation

## 2020-05-22 MED ORDER — DIPHENHYDRAMINE HCL 50 MG/ML IJ SOLN
25.0000 mg | Freq: Once | INTRAMUSCULAR | Status: AC
  Administered 2020-05-22: 25 mg via INTRAVENOUS
  Filled 2020-05-22: qty 1

## 2020-05-22 MED ORDER — HYDROCODONE-ACETAMINOPHEN 5-325 MG PO TABS
1.0000 | ORAL_TABLET | Freq: Once | ORAL | Status: AC
  Administered 2020-05-22: 1 via ORAL
  Filled 2020-05-22: qty 1

## 2020-05-22 MED ORDER — METOCLOPRAMIDE HCL 5 MG/ML IJ SOLN
10.0000 mg | Freq: Once | INTRAMUSCULAR | Status: AC
  Administered 2020-05-22: 10 mg via INTRAVENOUS
  Filled 2020-05-22: qty 2

## 2020-05-22 NOTE — Discharge Instructions (Signed)

## 2020-05-22 NOTE — Telephone Encounter (Signed)
Per [name]-  I have spoken to patient to advise that no cause for nausea was found on her recent endoscopy. Biopsies and exam were both negative/normal. Patient is advised to have MRI brain as currently scheduled on 06/09/20. She verbalizes understanding of this.

## 2020-05-22 NOTE — ED Provider Notes (Signed)
[facility] EMERGENCY DEPARTMENT Provider Note   CSN: [id number] Arrival date & time: 05/22/20  0400     History Chief Complaint  Patient presents with  . Headache    Kayla Terry is a 74 y.o. female.  The history is provided by the patient.  Headache Pain location:  L parietal Quality:  Dull Onset quality:  Gradual Duration:  12 hours Timing:  Constant Progression:  Unchanged Chronicity:  New Relieved by:  Nothing Worsened by:  Nothing Associated symptoms: fatigue and nausea   Associated symptoms: no dizziness, no fever, no focal weakness, no loss of balance, no visual change, no vomiting and no weakness   Patient presents for headache.  She reports onset of left-sided headache over 12 hours ago.  No fevers or vomiting.  She has mild nausea.  She has mild abdominal discomfort.  No new weakness.  No new visual changes.  No tick bites or rashes Patient reports since 1st of  May she has "felt like a wet dish rag "was had extensive work-up including recent neurology evaluation.  It is unclear what is causing her symptoms.  She was found to have B12 12 deficiency and has been getting supplements    Past Medical History:  Diagnosis Date  . Allergy   . B12 deficiency 04/2020  . Cataract   . GERD (gastroesophageal reflux disease)   . Herpes zoster without complications   . Hypertension   . Shingles     Patient Active Problem List   Diagnosis Date Noted  . B12 deficiency 05/07/2020  . Muscle weakness 05/06/2020  . Benign essential hypertension 06/08/2017  . Acid reflux disease 03/08/2016  . Hyperlipidemia 03/08/2016  . Nocturia 03/08/2016  . Restless leg syndrome 03/08/2016  . Chest tightness 08/21/2012    Past Surgical History:  Procedure Laterality Date  . ABDOMINAL HYSTERECTOMY  02/1984  . BACK SURGERY    . LUMBAR LAMINECTOMY    . OOPHORECTOMY  02/29/1984   unilateral     OB History   No obstetric history on file.     Family History  Problem  Relation Age of Onset  . Cancer Mother   . Leukemia Mother   . Lung cancer Father   . Multiple myeloma Father   . Multiple sclerosis Sister   . Alcohol abuse Brother   . Multiple myeloma Brother   . Colon cancer Neg Hx   . Colon polyps Neg Hx   . Stomach cancer Neg Hx   . Esophageal cancer Neg Hx     Social History   Tobacco Use  . Smoking status: Never Smoker  . Smokeless tobacco: Never Used  Vaping Use  . Vaping Use: Never used  Substance Use Topics  . Alcohol use: No  . Drug use: No    Home Medications Prior to Admission medications   Medication Sig Start Date End Date Taking? Authorizing Provider  ALPRAZolam (XANAX) 0.25 MG tablet Take 1-2 tabs (0.52m-0.50mg) 30-60 minutes before procedure. May repeat if needed.Do not drive. 05/06/20   AMelvenia Beam MD  aspirin EC 81 MG tablet Take 81 mg by mouth daily. Swallow whole.    [provider]  aspirin-acetaminophen-caffeine (EXCEDRIN MIGRAINE) 2854-674-8697MG tablet Take 1 tablet by mouth every 6 (six) hours as needed for headache (or pain).    [provider]  escitalopram (LEXAPRO) 10 MG tablet 1 tablet daily and routinely for anxiety. Patient not taking: Reported on 05/14/2020 04/21/20   [provider]  gabapentin (  NEURONTIN) 100 MG capsule Take 100 mg by mouth 3 (three) times daily.     [provider]  lansoprazole (PREVACID) 15 MG capsule Take 2 capsules (30 mg total) by mouth daily as needed. Patient taking differently: Take 15 mg by mouth daily as needed.  04/23/20   Esterwood, Amy S, PA-C  LORazepam (ATIVAN) 0.5 MG tablet Take 0.5 mg by mouth 2 (two) times daily.     [provider]  Multiple Vitamin (MULTIVITAMIN) tablet Take 1 tablet by mouth daily.    [provider]  NON FORMULARY Take 1 capsule by mouth See admin instructions. Probiotic: Flora Balance O'Donnell Formula Capsules: Take 1 capsule by mouth in the morning    [provider]  vitamin B-12  (CYANOCOBALAMIN) 500 MCG tablet Take 500 mcg by mouth daily. MD ordered 1000 mcg po qd per patient, but patient is currently taking 500 mcg po qd    [provider]  zolpidem (AMBIEN) 10 MG tablet Take 5-10 mg by mouth at bedtime as needed for sleep.    [provider]    Allergies    Naproxen sodium  Review of Systems   Review of Systems  Constitutional: Positive for fatigue. Negative for fever.  Eyes: Negative for visual disturbance.  Gastrointestinal: Positive for nausea. Negative for vomiting.  Skin: Negative for rash.  Neurological: Positive for headaches. Negative for dizziness, focal weakness, speech difficulty, weakness and loss of balance.  All other systems reviewed and are negative.   Physical Exam Updated Vital Signs BP (!) 159/102 (BP Location: Right Arm)   Pulse 80   Temp 98.1 F (36.7 C) (Oral)   Resp 16   Ht 1.6 m ('5\' 3"' )   Wt 63.5 kg   SpO2 98%   BMI 24.80 kg/m   Physical Exam CONSTITUTIONAL: Well developed/well nourished HEAD: Normocephalic/atraumatic EYES: EOMI/PERRL, no nystagmus, no ptosis ENMT: Mucous membranes moist NECK: supple no meningeal signs, no bruits SPINE/BACK:entire spine nontender CV: S1/S2 noted, no murmurs/rubs/gallops noted LUNGS: Lungs are clear to auscultation bilaterally, no apparent distress ABDOMEN: soft, nontender, no rebound or guarding GU:no cva tenderness NEURO:Awake/alert, face symmetric, no arm or leg drift is noted Equal 5/5 strength with shoulder abduction, elbow flex/extension, wrist flex/extension in upper extremities and equal hand grips bilaterally Equal 5/5 strength with hip flexion,knee flex/extension, foot dorsi/plantar flexion Cranial nerves 3/4/5/6/05/08/09/11/12 tested and intact Gait normal without ataxia No past pointing Sensation to light touch intact in all extremities EXTREMITIES: pulses normal, full ROM SKIN: warm, color normal PSYCH: no abnormalities of mood noted, alert and oriented  to situation   ED Results / Procedures / Treatments   Labs (all labs ordered are listed, but only abnormal results are displayed) Labs Reviewed - No data to display  EKG None  Radiology No results found.  Procedures Procedures  Medications Ordered in ED Medications  HYDROcodone-acetaminophen (NORCO/VICODIN) 5-325 MG per tablet 1 tablet (has no administration in time range)  metoCLOPramide (REGLAN) injection 10 mg (10 mg Intravenous Given 05/22/20 0534)  diphenhydrAMINE (BENADRYL) injection 25 mg (25 mg Intravenous Given 05/22/20 0534)    ED Course  I have reviewed the triage vital signs and the nursing notes.  MDM Rules/Calculators/A&P                          5:03 AM Patient with recent outpatient evaluation for generalized weakness.  Recently seen by neurology and found to have B12 deficiency.  She is also undergoing MRI  imaging in the next several weeks to evaluate for stroke as well as multiple sclerosis.  At this point there are no focal neuro deficits.  Patient is overall well-appearing, smiling and interactive.  I have low suspicion for [facility] or other acute neurologic emergency.  No meningeal signs to suggest meningitis 5:47 AM Patient is ambulatory.  Overall she appears improved.  She is now reporting myalgias in her arms and legs, which she reports is restless leg syndrome.  She reports she is no longer taking Neurontin for the symptoms but she is requesting pain medications at this time. Single dose of Vicodin has been ordered.  Overall patient is awake alert in no acute distress.  Defer further testing as an outpatient.  Patient has ride home Final Clinical Impression(s) / ED Diagnoses Final diagnoses:  Other headache syndrome  Myalgia    Rx / DC Orders ED Discharge Orders    None       Ripley Fraise, MD 05/22/20 [id number]

## 2020-05-22 NOTE — ED Triage Notes (Signed)
Pt c/o headache since about 5pm yesterday. Pt took 2 baby ASA about 7pm and a lorazepam about 9pm.

## 2020-05-25 ENCOUNTER — Emergency Department (HOSPITAL_COMMUNITY)
Admission: EM | Admit: 2020-05-25 | Discharge: 2020-05-25 | Disposition: A | Discharge status: Home / Self Care | Payer: Medicare Other | Attending: Emergency Medicine | Admitting: Emergency Medicine

## 2020-05-25 ENCOUNTER — Telehealth: Payer: Self-pay | Admitting: Gastroenterology

## 2020-05-25 ENCOUNTER — Encounter (HOSPITAL_COMMUNITY): Payer: Self-pay | Admitting: Emergency Medicine

## 2020-05-25 ENCOUNTER — Other Ambulatory Visit: Payer: Self-pay

## 2020-05-25 DIAGNOSIS — Z5321 Procedure and treatment not carried out due to patient leaving prior to being seen by health care provider: Secondary | ICD-10-CM | POA: Diagnosis not present

## 2020-05-25 DIAGNOSIS — R5383 Other fatigue: Secondary | ICD-10-CM | POA: Insufficient documentation

## 2020-05-25 DIAGNOSIS — R519 Headache, unspecified: Secondary | ICD-10-CM | POA: Diagnosis not present

## 2020-05-25 DIAGNOSIS — R109 Unspecified abdominal pain: Secondary | ICD-10-CM | POA: Insufficient documentation

## 2020-05-25 MED ORDER — SODIUM CHLORIDE 0.9% FLUSH: 3.0000 mL | Freq: Once | INTRAVENOUS | Status: DC

## 2020-05-25 NOTE — Telephone Encounter (Signed)
Pt is requesting a call back from [place] if possible

## 2020-05-25 NOTE — ED Triage Notes (Signed)
Pt reports since last week had headache, fatigued, and abd pains. Went to [facility] 3 days ago and states that they didn't do anything but still not feeling any better.

## 2020-05-25 NOTE — Telephone Encounter (Signed)
Left message for patient to call back  

## 2020-05-25 NOTE — ED Notes (Signed)
Pt stated that she did not want to wait that she would come another day. Pt stated that she had heard a lady in the waiting room say that she had been waiting to be seen since 9pm last night.

## 2020-05-26 ENCOUNTER — Telehealth: Payer: Self-pay | Admitting: Neurology

## 2020-05-26 MED ORDER — DICYCLOMINE HCL 10 MG PO CAPS
10.0000 mg | ORAL_CAPSULE | Freq: Four times a day (QID) | ORAL | 0 refills | Status: DC | PRN
Start: 2020-05-26 — End: 2020-05-28

## 2020-05-26 NOTE — Telephone Encounter (Signed)
Follow up arranged with Mike Gip PA for 05/28/20 2:00.  rx sent for dicyclomine sent to the pharmacy Left message for patient to call back

## 2020-05-26 NOTE — Telephone Encounter (Signed)
I don't see that she has complained about lower abdominal pain in her last few visits. Not sure of how new this is, the nausea we have been dealing with and for that agree that the MRI needs to happen for that. Otherwise, not sure if we have any openings in the near future to address the abdominal pain. Could try some bentyl empirically. She has had a negative CT scan in May of the abdomen / pelvis

## 2020-05-26 NOTE — Telephone Encounter (Signed)
Patient reports that she continues to complain of abdominal pain lower abdomen and nausea.  Not able to get out of the bed.  Daughter is calling to ask about gallbladder workup.  She states that her mother's pain is in her lower abd.  They are encouraged to contact the neurologist to move up the MRI for her.

## 2020-05-26 NOTE — Telephone Encounter (Signed)
Patient is scheduled at [facility] for 06/02/20. [employer] Medicare no auth.

## 2020-05-27 ENCOUNTER — Encounter: Payer: Medicare Other | Admitting: Gastroenterology

## 2020-05-27 NOTE — Telephone Encounter (Signed)
Patient notified

## 2020-05-28 ENCOUNTER — Ambulatory Visit: Payer: Self-pay

## 2020-05-28 ENCOUNTER — Ambulatory Visit: Payer: Medicare Other | Admitting: Physician Assistant

## 2020-05-28 ENCOUNTER — Telehealth: Payer: Self-pay | Admitting: Neurology

## 2020-05-28 DIAGNOSIS — R0600 Dyspnea, unspecified: Secondary | ICD-10-CM | POA: Diagnosis not present

## 2020-05-28 DIAGNOSIS — R110 Nausea: Secondary | ICD-10-CM

## 2020-05-28 DIAGNOSIS — R1013 Epigastric pain: Secondary | ICD-10-CM | POA: Diagnosis not present

## 2020-05-28 MED ORDER — FAMOTIDINE 20 MG PO TABS
20.0000 mg | ORAL_TABLET | Freq: Two times a day (BID) | ORAL | 3 refills | Status: DC
Start: 2020-05-28 — End: 2020-09-18

## 2020-05-28 MED ORDER — MIRTAZAPINE 15 MG PO TABS
15.0000 mg | ORAL_TABLET | Freq: Every day | ORAL | 4 refills | Status: DC
Start: 2020-05-28 — End: 2020-09-18

## 2020-05-28 NOTE — Telephone Encounter (Signed)
Pt called wanting to know if gallbladder was included in her blood work. Pt received a bill from [facility]. She would like the nurse to call her.

## 2020-05-28 NOTE — Telephone Encounter (Signed)
Spoke with pt and answered her questions. She will check with GI doctor. She said she is taking B12 oral daily. Plan to recheck in 2-3 months. Pt will call around September to schedule lab appt. Pt verbalized appreciation for the call.

## 2020-05-28 NOTE — Patient Instructions (Signed)
If you are age 74 or older, your body mass index should be between 23-30. Your There is no height or weight on file to calculate BMI. If this is out of the aforementioned range listed, please consider follow up with your Primary Care Provider.  If you are age 56 or younger, your body mass index should be between 19-25. Your There is no height or weight on file to calculate BMI. If this is out of the aformentioned range listed, please consider follow up with your Primary Care Provider.   You have been scheduled for an abdominal ultrasound at [facility] (1st floor of hospital) on 06/04/20 at 11:30 am. Please arrive 15 minutes prior to your appointment for registration. Make certain not to have anything to eat or drink 6 hours prior to your appointment. Should you need to reschedule your appointment, please contact radiology at [phone]. This test typically takes about 30 minutes to perform.  START Pepcid 20 mg 1 tablet twice daily STOP Pevacid START Remeron 15 1 tablet at bed   You have been scheduled to follow up with Amy Esterwood PA-C on September 16 st 2:00  pm

## 2020-06-02 ENCOUNTER — Encounter: Payer: Self-pay | Admitting: Physician Assistant

## 2020-06-02 NOTE — Progress Notes (Signed)
Subjective:    Patient ID: Kayla Terry, female    DOB: 08-13-1946, 74 y.o.   MRN: [id number]  HPI [name] is a pleasant 74 year old white female, established with Dr. Havery Moros, and last seen by myself on 04/23/2020.  She has history of hypertension, GERD, B12 deficiency, restless leg syndrome.  At the time of visit in June, she had presented with multiple symptoms over the prior 2 months including an atypical uneasiness in her chest, with negative cardiac evaluation, severe nausea, lack of appetite with food aversion and also aversion to odors as well as a persistent dull posterior headache.  She had also been having significant issues with insomnia, this started a couple of months after her husband had passed away.  She had been started on several new medications since April 2021, including his old of him, Dexilant, Carafate, Phenergan in addition to her usual low-dose hydrocodone and gabapentin. She had stopped most of her medications the day prior to that office visit and was already feeling significantly better.  It was felt that the majority of her symptoms were likely secondary to combination of adverse medication effects along with anxiety. At that time she was asked to stay off of Dexilant and Carafate, restart Prevacid 30 mg daily, use Phenergan 12.5 only as needed and to remain off his old of him.  She had just been given a prescription for lorazepam 0.5 twice daily as needed by her PCP and was encouraged to take this as needed.  She was scheduled for upper endoscopy which was completed on 05/19/2020 and was normal other than a 1 cm hiatal hernia. She called on 726 with continued abdominal pain and nausea.  She had had an unremarkable CT of the abdomen and pelvis in May 2021.  She was called in a trial of dicyclomine.  She also has upcoming MRI of the brain scheduled for 06/02/2020. What she is describing today is a gnawing discomfort or hunger pain type of feeling in her mid abdomen.  She  is back to taking Prevacid twice a day.  She says when she is feeling bad she just does not have an appetite and does not want to eat.  She had a B12 injection last week and complained of headache and nausea after that.  She has now on oral B12.  She continues to complain of nausea periodically which is improved with lorazepam.  She has been cautioned by her PCP to use the lorazepam very judiciously. Patient's daughter raises question of persistent issues due to grieving and anxiety but also wonders if her mother could have gallbladder disease.  Review of Systems Pertinent positive and negative review of systems were noted in the above HPI section.  All other review of systems was otherwise negative.  Outpatient Encounter Medications as of 05/28/2020  Medication Sig  . ALPRAZolam (XANAX) 0.25 MG tablet Take 1-2 tabs (0.22m-0.50mg) 30-60 minutes before procedure. May repeat if needed.Do not drive.  .Marland Kitchenaspirin EC 81 MG tablet Take 81 mg by mouth daily. Swallow whole.  .Marland Kitchenaspirin-acetaminophen-caffeine (EXCEDRIN MIGRAINE) 250-250-65 MG tablet Take 1 tablet by mouth every 6 (six) hours as needed for headache (or pain).  .Marland Kitchengabapentin (NEURONTIN) 100 MG capsule Take 100 mg by mouth 3 (three) times daily.   . lansoprazole (PREVACID) 15 MG capsule Take 2 capsules (30 mg total) by mouth daily as needed. (Patient taking differently: Take 15 mg by mouth daily as needed. )  . LORazepam (ATIVAN) 0.5 MG tablet Take 0.5 mg  by mouth 2 (two) times daily.   . Multiple Vitamin (MULTIVITAMIN) tablet Take 1 tablet by mouth daily.  . NON FORMULARY Take 1 capsule by mouth See admin instructions. Probiotic: Flora Balance O'Donnell Formula Capsules: Take 1 capsule by mouth in the morning  . vitamin B-12 (CYANOCOBALAMIN) 500 MCG tablet Take 500 mcg by mouth daily. MD ordered 1000 mcg po qd per patient, but patient is currently taking 500 mcg po qd  . zolpidem (AMBIEN) 10 MG tablet Take 5-10 mg by mouth at bedtime as needed for  sleep.  . famotidine (PEPCID) 20 MG tablet Take 1 tablet (20 mg total) by mouth 2 (two) times daily.  . mirtazapine (REMERON) 15 MG tablet Take 1 tablet (15 mg total) by mouth at bedtime.  . [DISCONTINUED] dicyclomine (BENTYL) 10 MG capsule Take 1 capsule (10 mg total) by mouth 4 (four) times daily as needed for spasms.  . [DISCONTINUED] escitalopram (LEXAPRO) 10 MG tablet 1 tablet daily and routinely for anxiety. (Patient not taking: Reported on 05/14/2020)   Facility-Administered Encounter Medications as of 05/28/2020  Medication  . cyanocobalamin ((VITAMIN B-12)) injection 1,000 mcg  . gi cocktail (Maalox,Lidocaine,Donnatal)   Allergies  Allergen Reactions  . Naproxen Sodium Anxiety   Patient Active Problem List   Diagnosis Date Noted  . B12 deficiency 05/07/2020  . Muscle weakness 05/06/2020  . Benign essential hypertension 06/08/2017  . Acid reflux disease 03/08/2016  . Hyperlipidemia 03/08/2016  . Nocturia 03/08/2016  . Restless leg syndrome 03/08/2016  . Chest tightness 08/21/2012   Social History   Socioeconomic History  . Marital status: Widowed    Spouse name: Not on file  . Number of children: Not on file  . Years of education: Not on file  . Highest education level: Not on file  Occupational History  . Not on file  Tobacco Use  . Smoking status: Never Smoker  . Smokeless tobacco: Never Used  Vaping Use  . Vaping Use: Never used  Substance and Sexual Activity  . Alcohol use: No  . Drug use: No  . Sexual activity: Not on file  Other Topics Concern  . Not on file  Social History Narrative   Lives alone    Her husband passed Feb 2021   Caffeine: maybe 1 cup/day, mostly water    Social Determinants of Health   Financial Resource Strain:   . Difficulty of Paying Living Expenses:   Food Insecurity:   . Worried About Charity fundraiser in the Last Year:   . Arboriculturist in the Last Year:   Transportation Needs:   . Film/video editor (Medical):     Marland Kitchen Lack of Transportation (Non-Medical):   Physical Activity:   . Days of Exercise per Week:   . Minutes of Exercise per Session:   Stress:   . Feeling of Stress :   Social Connections:   . Frequency of Communication with Friends and Family:   . Frequency of Social Gatherings with Friends and Family:   . Attends Religious Services:   . Active Member of Clubs or Organizations:   . Attends Archivist Meetings:   Marland Kitchen Marital Status:   Intimate Partner Violence:   . Fear of Current or Ex-Partner:   . Emotionally Abused:   Marland Kitchen Physically Abused:   . Sexually Abused:     Ms. Gargus family history includes Alcohol abuse in her brother; Cancer in her mother; Leukemia in her mother; Lung cancer in her father;  Multiple myeloma in her brother and father; Multiple sclerosis in her sister.      Objective:    There were no vitals filed for this visit.  Physical Exam Well-developed well-nourished  Older female in no acute distress.  Height, Weight 140 patient accompanied by her daughter HEENT; nontraumatic normocephalic, EOMI, PER R LA, sclera anicteric. Oropharynx; not examined Neck; supple, no JVD Cardiovascular; regular rate and rhythm with S1-S2, no murmur rub or gallop Pulmonary; Clear bilaterally Abdomen; soft, nontender, nondistended, no palpable mass or hepatosplenomegaly, bowel sounds are active Rectal; not done today Skin; benign exam, no jaundice rash or appreciable lesions Extremities; no clubbing cyanosis or edema skin warm and dry Neuro/Psych; alert and oriented x4, grossly nonfocal mood and affect appropriate       Assessment & Plan:   #68  74 year old white female with 3 to 35-monthhistory of intermittent nausea, decrease in appetite, intermittent food aversion, vague mid abdominal discomfort. Symptoms improved with lorazepam. Long discussion again today with the patient and her daughter.  Labs have been reassuring, CT of the abdomen and pelvis was negative in  May 2021 and EGD on 05/19/2020 unremarkable. I think her persistent symptoms are secondary to underlying depression and anxiety exacerbated by the death of her husband earlier this year.  She is now living alone.  2.  B12 deficiency-on replacement 3.  Restless leg syndrome 4.  Hypertension 5.  Colon cancer surveillance-negative colonoscopy 2015  Plan; stop Prevacid and use Pepcid 20 mg p.o. twice daily Schedule upper abdominal ultrasound Start Remeron 15 mg p.o. nightly, depending on response we may need to increase to 30 mg. Patient may continue lorazepam 0.5 p.o. twice daily as needed Follow-up with Dr. AHavery Morosor myself in 1 month.  Tatum Corl S Lerry Cordrey PA-C 06/02/2020   Cc: WChristain [place] MD

## 2020-06-02 NOTE — Progress Notes (Signed)
Agree with assessment and plan as outlined.  

## 2020-06-04 ENCOUNTER — Telehealth: Payer: Self-pay | Admitting: Physician Assistant

## 2020-06-04 ENCOUNTER — Ambulatory Visit (HOSPITAL_COMMUNITY): Payer: Medicare Other

## 2020-06-04 NOTE — Telephone Encounter (Signed)
Pts daughter Nathanial Millman called stating that [facility] cancelled her mothers  appt  today. She was very upset becaue they even confirmed appt. When they got there this morning they turned them away stating room was not available. And rescheduled them for 06-11-20 They would like to go to [facility] if possible for a sooner appt.  If not they will keep appt w/[facility] on the 12th.  Please advise  Please call pt at her cell phone number [phone]

## 2020-06-04 NOTE — Telephone Encounter (Signed)
Patient has been contacted and advised that her appointment was rescheduled to [facility] for tomorrow checking in at 8:15. I let her know that Radiology had tried to contact her to let her know that they needed to reschedule her due to staff shortage. I advised that I can not guarantee that her appointment may not get rescheduled again due to staff shortages.

## 2020-06-05 ENCOUNTER — Ambulatory Visit (HOSPITAL_COMMUNITY)
Admission: RE | Admit: 2020-06-05 | Discharge: 2020-06-05 | Disposition: A | Discharge status: Home / Self Care | Payer: Medicare Other | Source: Ambulatory Visit | Attending: Physician Assistant | Admitting: Physician Assistant

## 2020-06-05 ENCOUNTER — Other Ambulatory Visit: Payer: Self-pay

## 2020-06-05 DIAGNOSIS — R1013 Epigastric pain: Secondary | ICD-10-CM | POA: Insufficient documentation

## 2020-06-05 DIAGNOSIS — R0600 Dyspnea, unspecified: Secondary | ICD-10-CM | POA: Diagnosis present

## 2020-06-05 DIAGNOSIS — R110 Nausea: Secondary | ICD-10-CM | POA: Insufficient documentation

## 2020-06-08 ENCOUNTER — Telehealth: Payer: Self-pay | Admitting: Neurology

## 2020-06-08 DIAGNOSIS — M4802 Spinal stenosis, cervical region: Secondary | ICD-10-CM

## 2020-06-08 NOTE — Telephone Encounter (Signed)
Pt called and LVM wanting to know if the results of the MRI that she had done last Tuesday were available. Please advise.

## 2020-06-09 ENCOUNTER — Other Ambulatory Visit: Payer: Medicare Other

## 2020-06-09 NOTE — Telephone Encounter (Signed)
MRI is unremarkable, normal for age

## 2020-06-10 ENCOUNTER — Other Ambulatory Visit: Payer: Self-pay | Admitting: *Deleted

## 2020-06-10 DIAGNOSIS — E538 Deficiency of other specified B group vitamins: Secondary | ICD-10-CM

## 2020-06-10 NOTE — Telephone Encounter (Signed)
Spoke with patient and we discussed the MRI brain & c-spine results from [name]. Pt verbalized understanding and appreciation for the call. Her questions were answered during the call. She agreed to the referral to Dr Venetia Maxon. We also scheduled her for a B12 recheck per previous plan with Dr Lucia Gaskins. Orders are in.

## 2020-06-10 NOTE — Addendum Note (Signed)
Addended by: Bertram Savin on: 06/10/2020 04:41 PM   Modules accepted: Orders

## 2020-06-10 NOTE — Telephone Encounter (Signed)
Patient's MRI of the brain is unremarkable. 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 Venetia Terry at [facility](we will have to send him the CD - Kayla Terry). In her neck she also has multiple pinched nerves as well. This may need surgical intervention.

## 2020-06-10 NOTE — Telephone Encounter (Signed)
Request made

## 2020-06-11 ENCOUNTER — Ambulatory Visit (HOSPITAL_COMMUNITY): Payer: Medicare Other

## 2020-06-18 ENCOUNTER — Telehealth: Payer: Self-pay | Admitting: Physician Assistant

## 2020-06-19 NOTE — Telephone Encounter (Signed)
Left a message that I was returning her call.

## 2020-06-19 NOTE — Telephone Encounter (Signed)
Checking back on status of message she left yesterday. Would really like to speak to nurse today please.

## 2020-06-22 ENCOUNTER — Telehealth: Payer: Self-pay | Admitting: Neurology

## 2020-06-22 NOTE — Telephone Encounter (Signed)
MRI CD at the front desk . Patient coming to get .

## 2020-06-23 ENCOUNTER — Telehealth: Payer: Self-pay | Admitting: Physician Assistant

## 2020-06-23 NOTE — Telephone Encounter (Signed)
Called the patient back. No answer. Left her a voicemail. She does not have any change in her LFT's at this time. She should be serious about weight control, cholesterol control and healthy living.  Is there anything else I should tell her about fatty liver?

## 2020-06-23 NOTE — Telephone Encounter (Signed)
Avoid ETOH, regular exercise / walking  several times per week , We usually will do follow up US in one year and repeat LFT's

## 2020-06-25 NOTE — Telephone Encounter (Signed)
Written information mailed to the patient.

## 2020-07-01 ENCOUNTER — Encounter: Payer: Medicare Other | Admitting: Gastroenterology

## 2020-07-16 ENCOUNTER — Ambulatory Visit: Payer: Medicare Other | Admitting: Physician Assistant

## 2020-07-20 ENCOUNTER — Telehealth: Payer: Self-pay | Admitting: Neurology

## 2020-07-20 NOTE — Telephone Encounter (Signed)
I called the pt and advised her to call her primary care for evaluation and possible urine testing. Pt states she has been feeling "crummy" since her appointment and has seen the [facility] doctor and PCP and has been unable to identify the cause. Pt verbalized understanding and questions were answered.

## 2020-07-20 NOTE — Telephone Encounter (Signed)
Pt called wanting to know if she can get a urine test when she comes in for her blood work. She states that she has not been feeling well since she last came in to the office and has been urinating a whole lot. Please advise.

## 2020-07-21 ENCOUNTER — Other Ambulatory Visit (INDEPENDENT_AMBULATORY_CARE_PROVIDER_SITE_OTHER): Payer: Self-pay

## 2020-07-21 DIAGNOSIS — E538 Deficiency of other specified B group vitamins: Secondary | ICD-10-CM

## 2020-07-21 DIAGNOSIS — Z0289 Encounter for other administrative examinations: Secondary | ICD-10-CM

## 2020-07-22 ENCOUNTER — Telehealth: Payer: Self-pay | Admitting: *Deleted

## 2020-07-22 NOTE — Telephone Encounter (Signed)
Spoke with pt and advised her B12 is now normal and is greatly improved. I also let her know the MMA is pending but we will only call if this were to be abnormal. She verbalized understanding and appreciation for the call.

## 2020-07-22 NOTE — Telephone Encounter (Signed)
-----   Message from Anson Fret, MD sent at 07/22/2020  1:20 PM EDT ----- B12 normal 952, greatly improved from last testing thanks

## 2020-07-28 LAB — METHYLMALONIC ACID, SERUM: Methylmalonic Acid: 398 nmol/L — ABNORMAL HIGH (ref 0–378)

## 2020-07-28 LAB — VITAMIN B12: Vitamin B-12: 952 pg/mL (ref 232–1245)

## 2020-07-29 NOTE — Progress Notes (Signed)
Subjective:    Patient ID: Kayla Terry, female    DOB: Sep 21, 1946, 74 y.o.   MRN: [id number]  HPI Kayla Terry is a pleasant 74 year old white female, established with Dr. Havery Moros, who was last seen here on 04/02/2020, and comes in today after recent ER visit with ongoing persistent symptoms. Patient has history of GERD, hypertension and prior back surgeries.  She is also had some post herpetic neuralgia for which she had been given hydrocodone and gabapentin.  She says she had onset of her current symptoms on Mar 06, 2020 and prior to that had always been pretty healthy.  Her husband did pass away in February 2021.  She said initially that she thought she had been doing okay with his passing as it was expected but around March or April had started having problems with nighttime anxiety and insomnia. She had an acute episode on 03/06/2020 after she had gone to bed that night with a sudden sensation of the room spinning.  She had gotten up and taken her blood pressure which apparently was quite high at that time.  She was then seen by her PCP the next day and was advised to go to the emergency room as she did have a high systolic blood pressure.  She had work-up there including MRI of her head which was an incomplete exam because she became anxious and was unable to stay in the MRI machine.  She has complained off and on over the past 2 months of an uneasy sensation in her chest which became worse while she was in the MRI machine.  She has had cardiac evaluation to include troponins, D-dimers, and had stress test on 621 noted to have an EF of 60% and no acute findings and therefore low risk study. With ER visit on 04/15/2020 with complaints of weakness nausea and atypical chest discomfort she had CBC, hepatic panel lipase D-dimer and troponins as well as Covid testing all of which were negative. CT of the abdomen pelvis on 03/19/2020 - other than changes of atherosclerosis.  Barium swallow on 04/07/2020 showed  slight esophageal dysmotility.  CT of the chest on 04/15/2020 showed a 4 mm pulmonary nodule and otherwise negative study. Because of the complaints of the atypical chest discomfort her Prevacid had been bumped up to twice daily and then when she was seen by Dr. Havery Moros he switched her to Dexilant 60 mg p.o. daily and she was taking Phenergan for nausea. She has not had any more episodes of the acute spinning sensation but has had an ongoing dull headache in the back of her head over the past 6 weeks or so.  Is also had had been having ongoing problems with nausea, poor appetite and feeling queasy to the point that most smells were bothering her.  She was taking Phenergan every 8 hours without a lot of benefit. At some point she did not feel that the Dexilant was making much difference so she stopped taking it and did not notice any change in her symptoms. Yesterday she was seen by her PCP Dr. Redmond Pulling who started her on lorazepam 0.5 twice daily.  She took this yesterday and then took a dose again at bedtime last night as well as some Motrin for headache and today says she feels as good as she has felt in the past couple of months. She decided to stop taking hydrocodone over the past 36 hours also and relates that she had been given a sleeping pill which we  did not have on her list and had not previously been mentioned, I believe is all dependent.  She started this at some point in April I believe.  She stopped this over the past week.  With the above discontinuations of medicines she seems to be feeling much better.  She is not nauseated today though has not tried to eat and still has kind of a dull posterior headache but in general is feeling better. Her daughter who accompanies her says that they did not feel that she was tolerating the sleeping pill well at all, had an episode when she had called her daughter Kayla Terry and was rambling etc. and then had no recollection of it the next day. She has been  awaiting a neuro appointment which is scheduled for July 19. She is wondering today whether she needs to proceed with the previously scheduled endoscopy for July 7.  Review of Systems Pertinent positive and negative review of systems were noted in the above HPI section.  All other review of systems was otherwise negative.  Outpatient Encounter Medications as of 04/23/2020  Medication Sig  . aspirin-acetaminophen-caffeine (EXCEDRIN MIGRAINE) 250-250-65 MG tablet Take 1 tablet by mouth every 6 (six) hours as needed for headache (or pain).  Marland Kitchen gabapentin (NEURONTIN) 100 MG capsule Take 100 mg by mouth 3 (three) times daily.   Marland Kitchen LORazepam (ATIVAN) 0.5 MG tablet Take 0.5 mg by mouth 2 (two) times daily.   . Multiple Vitamin (MULTIVITAMIN) tablet Take 1 tablet by mouth daily.  . NON FORMULARY Take 1 capsule by mouth See admin instructions. Probiotic: Flora Balance O'Donnell Formula Capsules: Take 1 capsule by mouth in the morning  . [DISCONTINUED] aspirin 81 MG chewable tablet Chew 81 mg by mouth at bedtime as needed (for chest discomfort).   . [DISCONTINUED] dexlansoprazole (DEXILANT) 60 MG capsule Take 1 capsule (60 mg total) by mouth daily.  . [DISCONTINUED] HYDROcodone-acetaminophen (NORCO/VICODIN) 5-325 MG tablet Take 1 tablet by mouth at bedtime.   . [DISCONTINUED] promethazine (PHENERGAN) 12.5 MG tablet Take 1 tablet (12.5 mg total) by mouth every 8 (eight) hours as needed for nausea or vomiting. (Patient taking differently: Take 12.5 mg by mouth every 8 (eight) hours. )  . [DISCONTINUED] promethazine (PHENERGAN) 12.5 MG tablet Take 0.5 tablets (6.25 mg total) by mouth every 8 (eight) hours as needed for nausea or vomiting. (Patient not taking: Reported on 05/06/2020)  . [DISCONTINUED] sucralfate (CARAFATE) 1 g tablet Take 1 tablet (1 g total) by mouth 4 (four) times daily -  with meals and at bedtime.  . lansoprazole (PREVACID) 15 MG capsule Take 2 capsules (30 mg total) by mouth daily as needed.  (Patient taking differently: Take 15 mg by mouth daily as needed. )  . [DISCONTINUED] lansoprazole (PREVACID) 30 MG capsule Take 1 capsule (30 mg total) by mouth daily as needed.  . [DISCONTINUED] zolpidem (AMBIEN) 10 MG tablet Take 10 mg by mouth at bedtime.  (Patient not taking: Reported on 04/23/2020)   Facility-Administered Encounter Medications as of 04/23/2020  Medication  . gi cocktail (Maalox,Lidocaine,Donnatal)   Allergies  Allergen Reactions  . Naproxen Sodium Anxiety   Patient Active Problem List   Diagnosis Date Noted  . B12 deficiency 05/07/2020  . Muscle weakness 05/06/2020  . Benign essential hypertension 06/08/2017  . Acid reflux disease 03/08/2016  . Hyperlipidemia 03/08/2016  . Nocturia 03/08/2016  . Restless leg syndrome 03/08/2016  . Chest tightness 08/21/2012   Social History   Socioeconomic History  . Marital status:  Widowed    Spouse name: Not on file  . Number of children: Not on file  . Years of education: Not on file  . Highest education level: Not on file  Occupational History  . Not on file  Tobacco Use  . Smoking status: Never Smoker  . Smokeless tobacco: Never Used  Vaping Use  . Vaping Use: Never used  Substance and Sexual Activity  . Alcohol use: No  . Drug use: No  . Sexual activity: Not on file  Other Topics Concern  . Not on file  Social History Narrative   Lives alone    Her husband passed Feb 2021   Caffeine: maybe 1 cup/day, mostly water    Social Determinants of Health   Financial Resource Strain:   . Difficulty of Paying Living Expenses: Not on file  Food Insecurity:   . Worried About Charity fundraiser in the Last Year: Not on file  . Ran Out of Food in the Last Year: Not on file  Transportation Needs:   . Lack of Transportation (Medical): Not on file  . Lack of Transportation (Non-Medical): Not on file  Physical Activity:   . Days of Exercise per Week: Not on file  . Minutes of Exercise per Session: Not on file    Stress:   . Feeling of Stress : Not on file  Social Connections:   . Frequency of Communication with Friends and Family: Not on file  . Frequency of Social Gatherings with Friends and Family: Not on file  . Attends Religious Services: Not on file  . Active Member of Clubs or Organizations: Not on file  . Attends Archivist Meetings: Not on file  . Marital Status: Not on file  Intimate Partner Violence:   . Fear of Current or Ex-Partner: Not on file  . Emotionally Abused: Not on file  . Physically Abused: Not on file  . Sexually Abused: Not on file    Ms. Deemer family history includes Alcohol abuse in her brother; Cancer in her mother; Leukemia in her mother; Lung cancer in her father; Multiple myeloma in her brother and father; Multiple sclerosis in her sister.      Objective:    Vitals:   04/23/20 0842  BP: 124/84  Pulse: 86  SpO2: 98%    Physical Exam Well-developed well-nourished elderly  female in no acute distress. - accompanied by daughter Height, Weight, 143BMI 25.3  HEENT; nontraumatic normocephalic, EOMI, PER R LA, sclera anicteric. Oropharynx; not done Neck; supple, no JVD Cardiovascular; regular rate and rhythm with S1-S2, no murmur rub or gallop Pulmonary; Clear bilaterally Abdomen; soft, nontender, nondistended, no palpable mass or hepatosplenomegaly, bowel sounds are active Rectal;not done Skin; benign exam, no jaundice rash or appreciable lesions Extremities; no clubbing cyanosis or edema skin warm and dry Neuro/Psych; alert and oriented x4, grossly nonfocal mood and affect appropriate       Assessment & Plan:   #92 74 year old white female with multiple symptoms over the past 2 months, with intermittent atypical "uneasiness" in her chest and background history of mild GERD.  Cardiac evaluation thus far negative and recent negative stress test earlier this month. Patient had also had significant complaints of severe nausea, lack of  appetite aversion to smells, and persistent dull posterior headache. Had also been having issues with insomnia over the past couple of months. She had been started on several new medications since April 2021 including zolpidem for sleep, Dexilant and Carafate, Phenergan, on  top of her chronic low-dose hydrocodone and gabapentin. Patient feeling much better over the past 24 to 36 hours, off Zoldipem over the past week, off Dexilant, off Carafate, and off hydrocodone over the past 24 to 36 hours.  I suspect the majority of her symptoms have been a combination of adverse medication effects. She did not have any improvement in symptoms with Dexilant or Carafate, and I suspect her atypical chest discomfort is not related to reflux esophagitis.  #2 hypertension 3.  Prior back surgeries 4.  Postherpetic neuralgia #5 colon cancer screening-up-to-date negative colonoscopy 2015 elsewhere  Plan; Long discussion with patient and her daughter today as outlined above. Stopped Dexilant, stop Carafate Restart Prevacid 30 mg p.o. every morning Continue lorazepam 0.5 mg p.o. twice daily as needed just initiated earlier this week Stay off hydrocodone Stay off Zoldipem Use Phenergan at 12.5 mg dosage only as needed, not around-the-clock May continue Neurontin as needed Will leave endoscopy scheduled for 05/06/2020 with Dr. Havery Moros. Have asked patient to call back next Thursday with an update.  If she has continued improvement/resolution in symptoms  Plan  Kayla Bailey S Devonn Giampietro PA-C 07/29/2020   Cc: Christain Sacramento, MD

## 2020-08-04 ENCOUNTER — Emergency Department (HOSPITAL_COMMUNITY): Payer: Medicare Other

## 2020-08-04 ENCOUNTER — Emergency Department (HOSPITAL_COMMUNITY)
Admission: EM | Admit: 2020-08-04 | Discharge: 2020-08-04 | Disposition: A | Discharge status: Home / Self Care | Payer: Medicare Other | Attending: Emergency Medicine | Admitting: Emergency Medicine

## 2020-08-04 ENCOUNTER — Encounter (HOSPITAL_COMMUNITY): Payer: Self-pay | Admitting: Emergency Medicine

## 2020-08-04 ENCOUNTER — Other Ambulatory Visit: Payer: Self-pay

## 2020-08-04 DIAGNOSIS — Z79899 Other long term (current) drug therapy: Secondary | ICD-10-CM | POA: Insufficient documentation

## 2020-08-04 DIAGNOSIS — R079 Chest pain, unspecified: Secondary | ICD-10-CM | POA: Diagnosis present

## 2020-08-04 DIAGNOSIS — R0789 Other chest pain: Secondary | ICD-10-CM | POA: Insufficient documentation

## 2020-08-04 DIAGNOSIS — I10 Essential (primary) hypertension: Secondary | ICD-10-CM | POA: Insufficient documentation

## 2020-08-04 LAB — D-DIMER, QUANTITATIVE: D-Dimer, Quant: 0.46 ug/mL-FEU (ref 0.00–0.50)

## 2020-08-04 LAB — COMPREHENSIVE METABOLIC PANEL
ALT: 20 U/L (ref 0–44)
AST: 21 U/L (ref 15–41)
Albumin: 4.7 g/dL (ref 3.5–5.0)
Alkaline Phosphatase: 85 U/L (ref 38–126)
Anion gap: 11 (ref 5–15)
BUN: 9 mg/dL (ref 8–23)
CO2: 29 mmol/L (ref 22–32)
Calcium: 10.1 mg/dL (ref 8.9–10.3)
Chloride: 97 mmol/L — ABNORMAL LOW (ref 98–111)
Creatinine, Ser: 0.86 mg/dL (ref 0.44–1.00)
GFR calc non Af Amer: >60 mL/min (ref >60)
Glucose, Bld: 113 mg/dL — ABNORMAL HIGH (ref 70–99)
Potassium: 3.8 mmol/L (ref 3.5–5.1)
Sodium: 137 mmol/L (ref 135–145)
Total Bilirubin: 0.5 mg/dL (ref 0.3–1.2)
Total Protein: 7.6 g/dL (ref 6.5–8.1)

## 2020-08-04 LAB — CBC
HCT: 45.6 % (ref 36.0–46.0)
Hemoglobin: 14.9 g/dL (ref 12.0–15.0)
MCH: 29 pg (ref 26.0–34.0)
MCHC: 32.7 g/dL (ref 30.0–36.0)
MCV: 88.7 fL (ref 80.0–100.0)
Platelets: 329 10*3/uL (ref 150–400)
RBC: 5.14 MIL/uL — ABNORMAL HIGH (ref 3.87–5.11)
RDW: 13.2 % (ref 11.5–15.5)
WBC: 8.2 10*3/uL (ref 4.0–10.5)
nRBC: 0 % (ref 0.0–0.2)

## 2020-08-04 LAB — TROPONIN I (HIGH SENSITIVITY): Troponin I (High Sensitivity): 3 ng/L (ref <18)

## 2020-08-04 LAB — ETHANOL: Alcohol, Ethyl (B): <10 mg/dL (ref <10)

## 2020-08-04 LAB — LIPASE, BLOOD: Lipase: 29 U/L (ref 11–51)

## 2020-08-04 MED ORDER — SODIUM CHLORIDE 0.9 % IV BOLUS
500.0000 mL | Freq: Once | INTRAVENOUS | Status: AC
  Administered 2020-08-04: 500 mL via INTRAVENOUS

## 2020-08-04 NOTE — ED Triage Notes (Signed)
Pt c/o of centralized chest pain that began at 0600. Pt took a Norvasc at that time without relief. No heart hx.

## 2020-08-04 NOTE — Discharge Instructions (Signed)
As discussed, your evaluation today has been largely reassuring.  But, it is important that you monitor your condition carefully, and do not hesitate to return to the ED if you develop new, or concerning changes in your condition. ? ?Otherwise, please follow-up with your physician for appropriate ongoing care. ? ?

## 2020-08-04 NOTE — ED Provider Notes (Signed)
[facility] EMERGENCY DEPARTMENT Provider Note   CSN: [id number] Arrival date & time: 08/04/20  1118     History Chief Complaint  Patient presents with  . Chest Pain    Kayla Terry is a 74 y.o. female.  HPI   Presents the context of ongoing generalized illness that has been present since May of this year. She notes that since that time she has been meeting with her physician, has been attempting to for medication levels, but has had persistent office sensation, weakness. Today she woke about 4 hours ago with sternal chest pressure.  Since that time pain is seemingly slightly better, though persistent. No clear pleuritic or exertional factors. No syncope, no fever There is associated nausea. She did take her home medications, without relief.  Past Medical History:  Diagnosis Date  . Allergy   . B12 deficiency 04/2020  . Cataract   . GERD (gastroesophageal reflux disease)   . Herpes zoster without complications   . Hypertension   . Shingles     Patient Active Problem List   Diagnosis Date Noted  . B12 deficiency 05/07/2020  . Muscle weakness 05/06/2020  . Benign essential hypertension 06/08/2017  . Acid reflux disease 03/08/2016  . Hyperlipidemia 03/08/2016  . Nocturia 03/08/2016  . Restless leg syndrome 03/08/2016  . Chest tightness 08/21/2012    Past Surgical History:  Procedure Laterality Date  . ABDOMINAL HYSTERECTOMY  02/1984  . BACK SURGERY    . LUMBAR LAMINECTOMY    . OOPHORECTOMY  02/29/1984   unilateral     OB History   No obstetric history on file.     Family History  Problem Relation Age of Onset  . Cancer Mother   . Leukemia Mother   . Lung cancer Father   . Multiple myeloma Father   . Multiple sclerosis Sister   . Alcohol abuse Brother   . Multiple myeloma Brother   . Colon cancer Neg Hx   . Colon polyps Neg Hx   . Stomach cancer Neg Hx   . Esophageal cancer Neg Hx     Social History   Tobacco Use  . Smoking status:  Never Smoker  . Smokeless tobacco: Never Used  Vaping Use  . Vaping Use: Never used  Substance Use Topics  . Alcohol use: No  . Drug use: No    Home Medications Prior to Admission medications   Medication Sig Start Date End Date Taking? Authorizing Provider  famotidine (PEPCID) 20 MG tablet Take 1 tablet (20 mg total) by mouth 2 (two) times daily. Patient taking differently: Take 20 mg by mouth 2 (two) times daily as needed for heartburn or indigestion.  05/28/20  Yes Esterwood, Amy S, PA-C  gabapentin (NEURONTIN) 100 MG capsule Take 100 mg by mouth daily as needed (for restless legs).    Yes [provider]  LORazepam (ATIVAN) 0.5 MG tablet Take 0.5 mg by mouth 2 (two) times daily as needed for anxiety.    Yes [provider]  Multiple Vitamin (MULTIVITAMIN) tablet Take 1 tablet by mouth daily.   Yes [provider]  NON FORMULARY Take 1 capsule by mouth See admin instructions. Probiotic: Flora Balance O'Donnell Formula Capsules: Take 1 capsule by mouth in the morning   Yes [provider]  vitamin B-12 (CYANOCOBALAMIN) 500 MCG tablet Take 500 mcg by mouth daily. MD ordered 1000 mcg po qd per patient, but patient is currently taking 500 mcg po qd   Yes [provider]  zolpidem (AMBIEN) 10 MG tablet Take 5-10 mg by mouth at bedtime as needed for sleep.   Yes [provider]  ALPRAZolam (XANAX) 0.25 MG tablet Take 1-2 tabs (0.29m-0.50mg) 30-60 minutes before procedure. May repeat if needed.Do not drive. Patient not taking: Reported on 08/04/2020 05/06/20   AMelvenia Beam MD  lansoprazole (PREVACID) 15 MG capsule Take 2 capsules (30 mg total) by mouth daily as needed. Patient not taking: Reported on 08/04/2020 04/23/20   Esterwood, Amy S, PA-C  mirtazapine (REMERON) 15 MG tablet Take 1 tablet (15 mg total) by mouth at bedtime. Patient not taking: Reported on 08/04/2020 05/28/20   Esterwood, Amy S, PA-C    Allergies    Naproxen  sodium  Review of Systems   Review of Systems  Constitutional:       Per HPI, otherwise negative  HENT:       Per HPI, otherwise negative  Respiratory:       Per HPI, otherwise negative  Cardiovascular:       Per HPI, otherwise negative  Gastrointestinal: Negative for vomiting.  Endocrine:       Negative aside from HPI  Genitourinary:       Neg aside from HPI   Musculoskeletal:       Per HPI, otherwise negative  Skin: Negative.   Neurological: Negative for syncope.    Physical Exam Updated Vital Signs BP (!) 155/99 (BP Location: Right Arm)   Pulse 90   Temp 97.8 F (36.6 C) (Oral)   Resp 20   Ht '5\' 3"'  (1.6 m)   Wt 61.2 kg   SpO2 100%   BMI 23.91 kg/m   Physical Exam Vitals and nursing note reviewed.  Constitutional:      General: She is not in acute distress.    Appearance: She is well-developed.  HENT:     Head: Normocephalic and atraumatic.  Eyes:     Conjunctiva/sclera: Conjunctivae normal.  Cardiovascular:     Rate and Rhythm: Normal rate and regular rhythm.  Pulmonary:     Effort: Pulmonary effort is normal. No respiratory distress.     Breath sounds: Normal breath sounds. No stridor.  Abdominal:     General: There is no distension.  Skin:    General: Skin is warm and dry.  Neurological:     Mental Status: She is alert and oriented to person, place, and time.     Cranial Nerves: No cranial nerve deficit.     ED Results / Procedures / Treatments   Labs (all labs ordered are listed, but only abnormal results are displayed) Labs Reviewed  CBC - Abnormal; Notable for the following components:      Result Value   RBC 5.14 (*)    All other components within normal limits  COMPREHENSIVE METABOLIC PANEL - Abnormal; Notable for the following components:   Chloride 97 (*)    Glucose, Bld 113 (*)    All other components within normal limits  ETHANOL  LIPASE, BLOOD  D-DIMER, QUANTITATIVE (NOT [facility]  TROPONIN I (HIGH SENSITIVITY)  TROPONIN I  (HIGH SENSITIVITY)    EKG EKG Interpretation  Date/Time:  Tuesday August 04 2020 11:28:06 EDT Ventricular Rate:  89 PR Interval:  202 QRS Duration: 120 QT Interval:  382 QTC Calculation: 464 R Axis:   -176 Text Interpretation: Normal sinus rhythm Possible Left atrial enlargement Right superior axis deviation Pulmonary disease pattern Septal infarct , age undetermined Abnormal ECG Confirmed by LCarmin [phone]  on 08/04/2020 11:35:16 AM   Radiology DG Chest 2 View  Result Date: 08/04/2020 CLINICAL DATA:  Chest pain. EXAM: CHEST - 2 VIEW COMPARISON:  CT 04/15/2020.  Chest x-ray 04/15/2020. FINDINGS: Pectus deformity noted. Mediastinum and hilar structures normal. Cardiomegaly. No pulmonary venous congestion. Mild bibasilar subsegmental atelectasis. No pleural effusion or pneumothorax. Degenerative change thoracic spine. IMPRESSION: 1. Cardiomegaly. No pulmonary venous congestion. 2. Mild bibasilar subsegmental atelectasis. Electronically Signed   By: Marcello Moores  Register   On: 08/04/2020 12:47    Procedures Procedures (including critical care time)  Medications Ordered in ED Medications  sodium chloride 0.9 % bolus 500 mL (0 mLs Intravenous Stopped 08/04/20 1324)    ED Course  I have reviewed the triage vital signs and the nursing notes.  Pertinent labs & imaging results that were available during my care of the patient were reviewed by me and considered in my medical decision making (see chart for details).    MDM Rules/Calculators/A&P                          2:23 PM On repeat exam the patient is awake, alert, sitting upright, smiling.  Vital signs unremarkable.  I reviewed all labs with her, 2 normal troponin, nonischemic EKG, all reassuring for low suspicion of atypical ACS. Patient's D-dimer is negative, x-ray does not demonstrate pneumonia, nor pulmonary venous congestion. Some suspicion for the patient's ongoing illness contributing to today's exacerbation, but no  evidence for acute new processes. Patient appropriate for continued outpatient evaluation for her atypical chest pain. Final Clinical Impression(s) / ED Diagnoses Final diagnoses:  Atypical chest pain     Carmin Muskrat, MD 08/04/20 1425

## 2020-09-18 ENCOUNTER — Other Ambulatory Visit: Payer: Self-pay

## 2020-09-18 ENCOUNTER — Encounter: Payer: Self-pay | Admitting: General Practice

## 2020-09-18 ENCOUNTER — Ambulatory Visit: Payer: Medicare Other | Admitting: General Practice

## 2020-09-18 VITALS — BP 136/86 | HR 90 | Ht 63.0 in | Wt 133.0 lb

## 2020-09-18 DIAGNOSIS — R0789 Other chest pain: Secondary | ICD-10-CM

## 2020-09-18 DIAGNOSIS — I10 Essential (primary) hypertension: Secondary | ICD-10-CM

## 2020-09-18 MED ORDER — PANTOPRAZOLE SODIUM 20 MG PO TBEC
20.0000 mg | DELAYED_RELEASE_TABLET | Freq: Every day | ORAL | 3 refills | Status: DC
Start: 2020-09-18 — End: 2021-01-15

## 2020-09-18 NOTE — Patient Instructions (Addendum)
Food Choices for Gastroesophageal Reflux Disease, Adult When you have gastroesophageal reflux disease (GERD), the foods you eat and your eating habits are very important. Choosing the right foods can help ease your discomfort. Think about working with a nutrition specialist (dietitian) to help you make good choices. What are tips for following this plan?  Meals  Choose healthy foods that are low in fat, such as fruits, vegetables, whole grains, low-fat dairy products, and lean meat, fish, and poultry.  Eat small meals often instead of 3 large meals a day. Eat your meals slowly, and in a place where you are relaxed. Avoid bending over or lying down until 2-3 hours after eating.  Avoid eating meals 2-3 hours before bed.  Avoid drinking a lot of liquid with meals.  Cook foods using methods other than frying. Bake, grill, or broil food instead.  Avoid or limit: ? Chocolate. ? Peppermint or spearmint. ? Alcohol. ? Pepper. ? Black and decaffeinated coffee. ? Black and decaffeinated tea. ? Bubbly (carbonated) soft drinks. ? Caffeinated energy drinks and soft drinks.  Limit high-fat foods such as: ? Fatty meat or fried foods. ? Whole milk, cream, butter, or ice cream. ? Nuts and nut butters. ? Pastries, donuts, and sweets made with butter or shortening.  Avoid foods that cause symptoms. These foods may be different for everyone. Common foods that cause symptoms include: ? Tomatoes. ? Oranges, lemons, and limes. ? Peppers. ? Spicy food. ? Onions and garlic. ? Vinegar. Lifestyle  Maintain a healthy weight. Ask your doctor what weight is healthy for you. If you need to lose weight, work with your doctor to do so safely.  Exercise for at least 30 minutes for 5 or more days each week, or as told by your doctor.  Wear loose-fitting clothes.  Do not smoke. If you need help quitting, ask your doctor.  Sleep with the head of your bed higher than your feet. Use a wedge under the  mattress or blocks under the bed frame to raise the head of the bed. Summary  When you have gastroesophageal reflux disease (GERD), food and lifestyle choices are very important in easing your symptoms.  Eat small meals often instead of 3 large meals a day. Eat your meals slowly, and in a place where you are relaxed.  Limit high-fat foods such as fatty meat or fried foods.  Avoid bending over or lying down until 2-3 hours after eating.  Avoid peppermint and spearmint, caffeine, alcohol, and chocolate. This information is not intended to replace advice given to you by your health care provider. Make sure you discuss any questions you have with your health care provider. Document Revised: 02/07/2019 Document Reviewed: 11/22/2016 Elsevier Patient Education  2020 Elsevier Inc. Mindfulness-Based Stress Reduction Mindfulness-based stress reduction (MBSR) is a program that helps people learn to practice mindfulness. Mindfulness is the practice of intentionally paying attention to the present moment. It can be learned and practiced through techniques such as education, breathing exercises, meditation, and yoga. MBSR includes several mindfulness techniques in one program. MBSR works best when you understand the treatment, are willing to try new things, and can commit to spending time practicing what you learn. MBSR training may include learning about:  How your emotions, thoughts, and reactions affect your body.  New ways to respond to things that cause negative thoughts to start (triggers).  How to notice your thoughts and let go of them.  Practicing awareness of everyday things that you normally do without thinking.  The techniques and goals of different types of meditation. What are the benefits of MBSR? MBSR can have many benefits, which include helping you to:  Develop self-awareness. This refers to knowing and understanding yourself.  Learn skills and attitudes that help you to  participate in your own health care.  Learn new ways to care for yourself.  Be more accepting about how things are, and let things go.  Be less judgmental and approach things with an open mind.  Be patient with yourself and trust yourself more. MBSR has also been shown to:  Reduce negative emotions, such as depression and anxiety.  Improve memory and focus.  Change how you sense and approach pain.  Boost your body's ability to fight infections.  Help you connect better with other people.  Improve your sense of well-being. Follow these instructions at home:   Find a local in-person or online MBSR program.  Set aside some time regularly for mindfulness practice.  Find a mindfulness practice that works best for you. This may include one or more of the following: ? Meditation. Meditation involves focusing your mind on a certain thought or activity. ? Breathing awareness exercises. These help you to stay present by focusing on your breath. ? Body scan. For this practice, you lie down and pay attention to each part of your body from head to toe. You can identify tension and soreness and intentionally relax parts of your body. ? Yoga. Yoga involves stretching and breathing, and it can improve your ability to move and be flexible. It can also provide an experience of testing your body's limits, which can help you release stress. ? Mindful eating. This way of eating involves focusing on the taste, texture, color, and smell of each bite of food. Because this slows down eating and helps you feel full sooner, it can be an important part of a weight-loss plan.  Find a podcast or recording that provides guidance for breathing awareness, body scan, or meditation exercises. You can listen to these any time when you have a free moment to rest without distractions.  Follow your treatment plan as told by your health care provider. This may include taking regular medicines and making changes to  your diet or lifestyle as recommended. How to practice mindfulness To do a basic awareness exercise:  Find a comfortable place to sit.  Pay attention to the present moment. Observe your thoughts, feelings, and surroundings just as they are.  Avoid placing judgment on yourself, your feelings, or your surroundings. Make note of any judgment that comes up, and let it go.  Your mind may wander, and that is okay. Make note of when your thoughts drift, and return your attention to the present moment. To do basic mindfulness meditation:  Find a comfortable place to sit. This may include a stable chair or a firm floor cushion. ? Sit upright with your back straight. Let your arms fall next to your side with your hands resting on your legs. ? If sitting in a chair, rest your feet flat on the floor. ? If sitting on a cushion, cross your legs in front of you.  Keep your head in a neutral position with your chin dropped slightly. Relax your jaw and rest the tip of your tongue on the roof of your mouth. Drop your gaze to the floor. You can close your eyes if you like.  Breathe normally and pay attention to your breath. Feel the air moving in and out of your  nose. Feel your belly expanding and relaxing with each breath.  Your mind may wander, and that is okay. Make note of when your thoughts drift, and return your attention to your breath.  Avoid placing judgment on yourself, your feelings, or your surroundings. Make note of any judgment or feelings that come up, let them go, and bring your attention back to your breath.  When you are ready, lift your gaze or open your eyes. Pay attention to how your body feels after the meditation. Where to find more information You can find more information about MBSR from:  Your health care provider.  Community-based meditation centers or programs.  Programs offered near you. Summary  Mindfulness-based stress reduction (MBSR) is a program that teaches you  how to intentionally pay attention to the present moment. It is used with other treatments to help you cope better with daily stress, emotions, and pain.  MBSR focuses on developing self-awareness, which allows you to respond to life stress without judgment or negative emotions.  MBSR programs may involve learning different mindfulness practices, such as breathing exercises, meditation, yoga, body scan, or mindful eating. Find a mindfulness practice that works best for you, and set aside time for it on a regular basis. This information is not intended to replace advice given to you by your health care provider. Make sure you discuss any questions you have with your health care provider. Document Revised: 09/29/2017 Document Reviewed: 02/23/2017 Elsevier Patient Education  2020 Elsevier Inc.    Medication Instructions:  STOP Pepcid   START Protonix 20 mg daily   *If you need a refill on your cardiac medications before your next appointment, please call your pharmacy*   Lab Work: None today If you have labs (blood work) drawn today and your tests are completely normal, you will receive your results only by: Marland Kitchen MyChart Message (if you have MyChart) OR . A paper copy in the mail If you have any lab test that is abnormal or we need to change your treatment, we will call you to review the results.   Testing/Procedures: None today   Follow-Up: At [facility], you and your health needs are our priority.  As part of our continuing mission to provide you with exceptional heart care, we have created designated Provider Care Teams.  These Care Teams include your primary Cardiologist (physician) and Advanced Practice Providers (APPs -  Physician Assistants and Nurse Practitioners) who all work together to provide you with the care you need, when you need it.  We recommend signing up for the patient portal called "MyChart".  Sign up information is provided on this After Visit Summary.   MyChart is used to connect with patients for Virtual Visits (Telemedicine).  Patients are able to view lab/test results, encounter notes, upcoming appointments, etc.  Non-urgent messages can be sent to your provider as well.   To learn more about what you can do with MyChart, go to [email or web address].    Your next appointment:   3 month(s)  The format for your next appointment:   In Person  Provider:   Dina Rich, MD   Other Instructions Your wrist BP cuff registers 148/110. Our manual cuff on your upper arm is 136/86. Please do not use your wrist monitor again. [employer] makes a reliable blood pressure monitor that you can purchase at most pharmacies. Purchase monitor that measures pressure on your upper arm.        Thank you for choosing [facility] !

## 2020-09-18 NOTE — Progress Notes (Signed)
Cardiology Clinic Note   Patient Name: Kayla Terry Date of Encounter: 09/18/2020  Primary Care Provider:  Christain Sacramento, MD Primary Cardiologist:  Carlyle Dolly, MD  Patient Profile    Kayla Terry 74 year old female presents the clinic today for evaluation of her hypertension.  Past Medical History    Past Medical History:  Diagnosis Date   Allergy    B12 deficiency 04/2020   Cataract    GERD (gastroesophageal reflux disease)    Herpes zoster without complications    Hypertension    Shingles    Past Surgical History:  Procedure Laterality Date   ABDOMINAL HYSTERECTOMY  02/1984   BACK SURGERY     LUMBAR LAMINECTOMY     OOPHORECTOMY  02/29/1984   unilateral    Allergies  Allergies  Allergen Reactions   Naproxen Sodium Anxiety    History of Present Illness    Kayla Terry has a PMH of essential hypertension, chest tightness, acid reflux disease, HLD, and restless leg syndrome.  She was last seen by Dr. Gwenlyn Found on 04/08/2020.  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 Redmond Pulling for atypical chest pain.  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  She was previously diagnosed with hypertension based on amlodipine however, it was discontinued.  She has no family history of cardiovascular disease.  She also noted chest discomfort that began Feb 29, 2020.  Her pain was constant.  She was evaluated by Dr. Acie Fredrickson in 2013 and had a negative nuclear stress test.  Her blood pressure at her follow-up visit with Dr. Gwenlyn Found was 156/96.  Her EKG showed sinus rhythm with septal Q waves left axis deviation.  She presents the clinic today for follow-up evaluation states her blood pressures have continued to be elevated at home.  She reports that she is monitoring her blood pressure with a wrist cuff.  I will have her retrieve her wrist cuff and have it checked with the office cuff.  She reports constant dull chest ache.  She also  reports that she has not been taking her Pepcid medication.  Her chest symptoms appear to be related to stress versus reflux.  I will start her on Protonix, have her the GERD diet sheet, give her the salty 6 diet sheet, and also provide her with the mindfulness stress reduction instructions.  We will have her follow-up in 3 months.    Her home blood pressure wrist cuff was evaluated next to the office cuff and she showed a substantial difference between cuffs.  Systolic numbers were greater than 10 mmHg while diastolic numbers were 87-56 points higher with her cuff.  She was instructed to replace her wrist blood pressure cuff.  Today she denies  shortness of breath, lower extremity edema, fatigue, palpitations, melena, hematuria, hemoptysis, diaphoresis, weakness, presyncope, syncope, orthopnea, and PND.     Home Medications    Prior to Admission medications   Medication Sig Start Date End Date Taking? Authorizing Provider  ALPRAZolam (XANAX) 0.25 MG tablet Take 1-2 tabs (0.63m-0.50mg) 30-60 minutes before procedure. May repeat if needed.Do not drive. Patient not taking: Reported on 08/04/2020 05/06/20   AMelvenia Beam MD  famotidine (PEPCID) 20 MG tablet Take 1 tablet (20 mg total) by mouth 2 (two) times daily. Patient taking differently: Take 20 mg by mouth 2 (two) times daily as needed for heartburn or indigestion.  05/28/20   Esterwood, Amy S, PA-C  gabapentin (NEURONTIN) 100 MG capsule  Take 100 mg by mouth daily as needed (for restless legs).     [provider]  lansoprazole (PREVACID) 15 MG capsule Take 2 capsules (30 mg total) by mouth daily as needed. Patient not taking: Reported on 08/04/2020 04/23/20   Esterwood, Amy S, PA-C  LORazepam (ATIVAN) 0.5 MG tablet Take 0.5 mg by mouth 2 (two) times daily as needed for anxiety.     [provider]  mirtazapine (REMERON) 15 MG tablet Take 1 tablet (15 mg total) by mouth at bedtime. Patient not taking: Reported on 08/04/2020  05/28/20   Esterwood, Amy S, PA-C  Multiple Vitamin (MULTIVITAMIN) tablet Take 1 tablet by mouth daily.    [provider]  NON FORMULARY Take 1 capsule by mouth See admin instructions. Probiotic: Flora Balance O'Donnell Formula Capsules: Take 1 capsule by mouth in the morning    [provider]  vitamin B-12 (CYANOCOBALAMIN) 500 MCG tablet Take 500 mcg by mouth daily. MD ordered 1000 mcg po qd per patient, but patient is currently taking 500 mcg po qd    [provider]  zolpidem (AMBIEN) 10 MG tablet Take 5-10 mg by mouth at bedtime as needed for sleep.    [provider]    Family History    Family History  Problem Relation Age of Onset   Cancer Mother    Leukemia Mother    Lung cancer Father    Multiple myeloma Father    Multiple sclerosis Sister    Alcohol abuse Brother    Multiple myeloma Brother    Colon cancer Neg Hx    Colon polyps Neg Hx    Stomach cancer Neg Hx    Esophageal cancer Neg Hx    She indicated that her mother is deceased. She indicated that her father is deceased. She indicated that her sister is alive. She indicated that her brother is alive. She indicated that her daughter is alive. She indicated that her son is alive. She indicated that the status of her neg hx is unknown.  Social History    Social History   Socioeconomic History   Marital status: Widowed    Spouse name: Not on file   Number of children: Not on file   Years of education: Not on file   Highest education level: Not on file  Occupational History   Not on file  Tobacco Use   Smoking status: Never Smoker   Smokeless tobacco: Never Used  Vaping Use   Vaping Use: Never used  Substance and Sexual Activity   Alcohol use: No   Drug use: No   Sexual activity: Not on file  Other Topics Concern   Not on file  Social History Narrative   Lives alone    Her husband passed Feb 2021   Caffeine: maybe 1 cup/day, mostly water     Social Determinants of Health   Financial Resource Strain:    Difficulty of Paying Living Expenses: Not on file  Food Insecurity:    Worried About Charity fundraiser in the Last Year: Not on file   [employer] of Food in the Last Year: Not on file  Transportation Needs:    Lack of Transportation (Medical): Not on file   Lack of Transportation (Non-Medical): Not on file  Physical Activity:    Days of Exercise per Week: Not on file   Minutes of Exercise per Session: Not on file  Stress:    Feeling of Stress : Not on file  Social Connections:    Frequency of Communication with Friends and Family: Not on file   Frequency of Social Gatherings with Friends and Family: Not on file   Attends Religious Services: Not on Electrical engineer or Organizations: Not on file   Attends Archivist Meetings: Not on file   Marital Status: Not on file  Intimate Partner Violence:    Fear of Current or Ex-Partner: Not on file   Emotionally Abused: Not on file   Physically Abused: Not on file   Sexually Abused: Not on file     Review of Systems    General:  No chills, fever, night sweats or weight changes.  Cardiovascular:  No chest pain, dyspnea on exertion, edema, orthopnea, palpitations, paroxysmal nocturnal dyspnea. Dermatological: No rash, lesions/masses Respiratory: No cough, dyspnea Urologic: No hematuria, dysuria Abdominal:   No nausea, vomiting, diarrhea, bright red blood per rectum, melena, or hematemesis Neurologic:  No visual changes, wkns, changes in mental status. All other systems reviewed and are otherwise negative except as noted above.  Physical Exam    VS:  BP 122/84    Pulse 90    Ht '5\' 3"'  (1.6 m)    Wt 133 lb (60.3 kg)    SpO2 99%    BMI 23.56 kg/m  , BMI Body mass index is 23.56 kg/m. GEN: Well nourished, well developed, in no acute distress. HEENT: normal. Neck: Supple, no JVD, carotid bruits, or masses. Cardiac: RRR, no murmurs,  rubs, or gallops. No clubbing, cyanosis, edema.  Radials/DP/PT 2+ and equal bilaterally.  Respiratory:  Respirations regular and unlabored, clear to auscultation bilaterally. GI: Soft, nontender, nondistended, BS + x 4. MS: no deformity or atrophy. Skin: warm and dry, no rash. Neuro:  Strength and sensation are intact. Psych: Normal affect.  Accessory Clinical Findings    Recent Labs: 05/06/2020: Magnesium 2.2; TSH 2.400 08/04/2020: ALT 20; BUN 9; Creatinine, Ser 0.86; Hemoglobin 14.9; Platelets 329; Potassium 3.8; Sodium 137   Recent Lipid Panel No results found for: CHOL, TRIG, HDL, CHOLHDL, VLDL, LDLCALC, LDLDIRECT  ECG personally reviewed by me today-none today.  EKG 08/04/2020 sinus rhythm LAE, right axis deviation.  89 bpm  Assessment & Plan   1.  Essential hypertension-BP today 122/84.  Continues to be high at home.  Her home blood pressure cuff it is a wrist cuff. Restart amlodipine Heart healthy low-sodium diet-salty 6 given Increase physical activity as tolerated Maintain blood pressure log  GERD-has had constant dull chest ache since May.  She has stopped taking her Pepcid.  Feel that this may be symptoms that is causing her chest discomfort/ache. Start Protonix GERD diet Mindfulness stress reduction  Chest tightness-  Was seen and evaluated by Dr. Acie Fredrickson in 2013.  Underwent nuclear stress test which was negative.  No family history of cardiovascular disease. No plans for ischemic evaluation Continue to monitor Reassured that her chest tightness was not related to cardiac issues.  Disposition: Follow-up with Dr. Harl Bowie in 3 months.  Jossie Ng. Rahmel Nedved NP-C    09/18/2020, 3:37 PM [address] Office [phone] Fax [phone]  Notice: This dictation was prepared with Dragon dictation along with smaller phrase technology. Any transcriptional errors that result from this process are unintentional and may not be  corrected upon review.

## 2020-10-13 ENCOUNTER — Telehealth: Payer: Self-pay | Admitting: Cardiology

## 2020-10-13 NOTE — Telephone Encounter (Signed)
New message     Patient has a cold and wants to know if it is safe to l-ysine with her protonix, she has a cold and usually take the l-ysine when she is sick but when she Googled the medications it says there can be a interaction

## 2020-10-13 NOTE — Telephone Encounter (Signed)
10/13/20  Contacted the patient and let her know that it would probably be a good idea to get in touch with her pharmacist and ask about the reaction she could potentially have while taking Protonix and L-lysine together.

## 2020-12-14 ENCOUNTER — Telehealth: Payer: Self-pay | Admitting: Neurology

## 2020-12-14 NOTE — Telephone Encounter (Signed)
Muscle weakness f/u.  Pt was asked if she has had new symptoms and she stated yes.  . When asked what they were pt just stated it has more to do with muscle weakness and being tired all the time.  Pt was told this would be forwarded to RN and if she has questions or concerns she will call to discuss.

## 2020-12-23 ENCOUNTER — Ambulatory Visit: Payer: Medicare Other | Admitting: Cardiology

## 2021-01-15 ENCOUNTER — Other Ambulatory Visit: Payer: Self-pay | Admitting: Student

## 2021-01-15 ENCOUNTER — Encounter: Payer: Self-pay | Admitting: Student

## 2021-01-15 ENCOUNTER — Ambulatory Visit (INDEPENDENT_AMBULATORY_CARE_PROVIDER_SITE_OTHER): Payer: Medicare Other

## 2021-01-15 ENCOUNTER — Other Ambulatory Visit: Payer: Self-pay

## 2021-01-15 ENCOUNTER — Ambulatory Visit: Payer: Medicare Other | Admitting: Student

## 2021-01-15 VITALS — BP 138/84 | HR 84 | Ht 63.0 in | Wt 133.8 lb

## 2021-01-15 DIAGNOSIS — R002 Palpitations: Secondary | ICD-10-CM | POA: Diagnosis not present

## 2021-01-15 DIAGNOSIS — I10 Essential (primary) hypertension: Secondary | ICD-10-CM

## 2021-01-15 DIAGNOSIS — R001 Bradycardia, unspecified: Secondary | ICD-10-CM | POA: Diagnosis not present

## 2021-01-15 DIAGNOSIS — R0789 Other chest pain: Secondary | ICD-10-CM

## 2021-01-15 NOTE — Patient Instructions (Addendum)
Medication Instructions:  Your physician recommends that you continue on your current medications as directed. Please refer to the Current Medication list given to you today.  *If you need a refill on your cardiac medications before your next appointment, please call your pharmacy*   Lab Work: CBC BMET TSH  If you have labs (blood work) drawn today and your tests are completely normal, you will receive your results only by: Marland Kitchen MyChart Message (if you have MyChart) OR . A paper copy in the mail If you have any lab test that is abnormal or we need to change your treatment, we will call you to review the results.   Testing/Procedures: None   Follow-Up: At [facility], you and your health needs are our priority.  As part of our continuing mission to provide you with exceptional heart care, we have created designated Provider Care Teams.  These Care Teams include your primary Cardiologist (physician) and Advanced Practice Providers (APPs -  Physician Assistants and Nurse Practitioners) who all work together to provide you with the care you need, when you need it.  We recommend signing up for the patient portal called "MyChart".  Sign up information is provided on this After Visit Summary.  MyChart is used to connect with patients for Virtual Visits (Telemedicine).  Patients are able to view lab/test results, encounter notes, upcoming appointments, etc.  Non-urgent messages can be sent to your provider as well.   To learn more about what you can do with MyChart, go to [email or web address].    Your next appointment:   6 week(s)  The format for your next appointment:   In Person  Provider:   Dr Allyson Sabal at [facility] [place]   Other Instructions ZIO AT  . Call [employer] company with any questions during wear 24/7: [phone] . Zio patch should be worn for 14 days unless otherwise instructed . The [facility]) will call you 24-48 hrs. after [employer] is  applied, be sure to answer the phone from a 224 area code.  They may call during wear with important information regarding your heart, so answer any calls from [employer] . Do not shower for 24 hours after Zio is applied (sponge bath is fine, just keep the patch dry) . After that, when showering avoid direct shower stream of water onto Zio patch, pat dry around the patch . Avoid excessive sweating . Push the button when you are feeling any cardiac symptoms and record them in the logbook or on the Zio app . Refer to the removal date listed on the front of the symptom logbook and remove Zio patch according to the instructions in the back of the logbook . Place Zio patch inside transmitter (sticky side facing up, button facing down) and put both the gateway and symptom logbook in the prepaid mailing envelope included in the back of the transmitter.  Place inside mailbox or any USPS mailbox

## 2021-01-15 NOTE — Progress Notes (Signed)
Cardiology Office Note    Date:  01/16/2021   ID:  Kayla Terry, Kayla Terry 1946-04-29, MRN [id number]  PCP:  Kayla Sacramento, MD  Cardiologist: Quay Burow, MD    Chief Complaint  Patient presents with  . Follow-up    Bradycardia    History of Present Illness:    Kayla Terry is a 75 y.o. female with past medical history of HTN, GERD and prior chest pain (low-risk NST in 03/2020) who presents to the office today for evaluation of bradycardia.  She was last examined by Coletta Memos, NP in 08/2020 and reported that her blood pressure had continued to be elevated at home but it was found that her home cuff was malfunctioning and reading 20-30 points higher than the office cuff. She also reported episodes of dull chest pain which was felt to be most consistent with GERD and she was started on Protonix.  In talking with the patient today, she reports a variety of symptoms over the past year. She reports having generalized fatigue and decreased energy during this timeframe and says she feels like a "drained dish rag". Has been evaluated by Cardiology, Neurology and GI without any identifiable etiologies. She reports an unintentional weight loss of 20 pounds during this timeframe along with cold intolerance. Over the past few weeks, she has been checking her vitals more closely and has noted her heart rate will be in the 90's at times but then can drop into the 40's to 50's. She reports worsening dizziness and fatigue when her heart rate is slow. She also reports palpitations at time with minimal activity.     Past Medical History:  Diagnosis Date  . Allergy   . B12 deficiency 04/2020  . Cataract   . GERD (gastroesophageal reflux disease)   . Herpes zoster without complications   . Hypertension   . Shingles     Past Surgical History:  Procedure Laterality Date  . ABDOMINAL HYSTERECTOMY  02/1984  . BACK SURGERY    . LUMBAR LAMINECTOMY    . OOPHORECTOMY  02/29/1984    unilateral    Current Medications: Outpatient Medications Prior to Visit  Medication Sig Dispense Refill  . gabapentin (NEURONTIN) 100 MG capsule Take 100 mg by mouth daily as needed (for restless legs).     . LORazepam (ATIVAN) 0.5 MG tablet Take 0.5 mg by mouth 2 (two) times daily as needed for anxiety.     . Multiple Vitamin (MULTIVITAMIN) tablet Take 1 tablet by mouth daily.    . vitamin B-12 (CYANOCOBALAMIN) 500 MCG tablet Take 500 mcg by mouth daily. MD ordered 1000 mcg po qd per patient, but patient is currently taking 500 mcg po qd    . zolpidem (AMBIEN) 10 MG tablet Take 5-10 mg by mouth at bedtime as needed for sleep.    . pantoprazole (PROTONIX) 20 MG tablet Take 1 tablet (20 mg total) by mouth daily. 90 tablet 3   Facility-Administered Medications Prior to Visit  Medication Dose Route Frequency Provider Last Rate Last Admin  . gi cocktail (Maalox,Lidocaine,Donnatal)  30 mL Oral Once Copland, Gay Filler, MD         Allergies:   Escitalopram, Pantoprazole, and Naproxen sodium   Social History   Socioeconomic History  . Marital status: Widowed    Spouse name: Not on file  . Number of children: Not on file  . Years of education: Not on file  . Highest education level: Not on file  Occupational  History  . Not on file  Tobacco Use  . Smoking status: Never Smoker  . Smokeless tobacco: Never Used  Vaping Use  . Vaping Use: Never used  Substance and Sexual Activity  . Alcohol use: No  . Drug use: No  . Sexual activity: Not on file  Other Topics Concern  . Not on file  Social History Narrative   Lives alone    Her husband passed Feb 2021   Caffeine: maybe 1 cup/day, mostly water    Social Determinants of Health   Financial Resource Strain: Not on file  Food Insecurity: Not on file  Transportation Needs: Not on file  Physical Activity: Not on file  Stress: Not on file  Social Connections: Not on file     Family History:  The patient's family history includes  Alcohol abuse in her brother; Cancer in her mother; Leukemia in her mother; Lung cancer in her father; Multiple myeloma in her brother and father; Multiple sclerosis in her sister.   Review of Systems:   Please see the history of present illness.     General:  No fever or night sweats. Positive for fatigue, chills and weight loss.  Cardiovascular:  No edema, orthopnea, paroxysmal nocturnal dyspnea. Positive for palpitations and chest pain.  Dermatological: No rash, lesions/masses Respiratory: No cough, Positive for dyspnea. Urologic: No hematuria, dysuria Abdominal:   No nausea, vomiting, diarrhea, bright red blood per rectum, melena, or hematemesis Neurologic:  No visual changes, wkns, changes in mental status. All other systems reviewed and are otherwise negative except as noted above.   Physical Exam:    VS:  BP 138/84   Pulse 84   Ht '5\' 3"'  (1.6 m)   Wt 133 lb 12.8 oz (60.7 kg)   SpO2 97%   BMI 23.70 kg/m    General: Well developed, well nourished,female appearing in no acute distress. Head: Normocephalic, atraumatic. Neck: No carotid bruits. JVD not elevated.  Lungs: Respirations regular and unlabored, without wheezes or rales.  Heart: Regular rate and rhythm. No S3 or S4.  No murmur, no rubs, or gallops appreciated. Abdomen: Appears non-distended. No obvious abdominal masses. Msk:  Strength and tone appear normal for age. No obvious joint deformities or effusions. Extremities: No clubbing or cyanosis. No lower extremity edema.  Distal pedal pulses are 2+ bilaterally. Neuro: Alert and oriented X 3. Moves all extremities spontaneously. No focal deficits noted. Psych:  Responds to questions appropriately with a normal affect. Skin: No rashes or lesions noted  Wt Readings from Last 3 Encounters:  01/15/21 133 lb 12.8 oz (60.7 kg)  09/18/20 133 lb (60.3 kg)  08/04/20 135 lb (61.2 kg)     Studies/Labs Reviewed:   EKG:  EKG is ordered today.  The ekg ordered today  demonstrates NSR, HR 80 with LAFB. No acute ST changes when compared to prior tracings.   Recent Labs: 05/06/2020: Magnesium 2.2; TSH 2.400 08/04/2020: ALT 20; BUN 9; Creatinine, Ser 0.86; Hemoglobin 14.9; Platelets 329; Potassium 3.8; Sodium 137   Lipid Panel No results found for: CHOL, TRIG, HDL, CHOLHDL, VLDL, LDLCALC, LDLDIRECT  Additional studies/ records that were reviewed today include:   Echocardiogram: 08/2012 Study Conclusions   - Left ventricle: The cavity size was normal. Wall thickness  was normal. Systolic function was normal. The estimated  ejection fraction was in the range of 55% to 60%. Wall  motion was normal; there were no regional wall motion  abnormalities. Features are consistent with a pseudonormal  left  ventricular filling pattern, with concomitant  abnormal relaxation and increased filling pressure (grade  2 diastolic dysfunction).  - Mitral valve: Mild regurgitation.  - Atrial septum: No defect or patent foramen ovale was  identified.   NST: 03/2020  The left ventricular ejection fraction is normal (55-65%).  Nuclear stress EF: 60%.  There was no ST segment deviation noted during stress.  There is a small defect of mild severity present in the basal inferoseptal, mid anteroseptal and mid inferoseptal location. The defect is non-reversible and worse on rest imaging. This is consistent with breast attenuation artifact. No ischemia.  This is a low risk study.    Assessment:    1. Bradycardia   2. Palpitations   3. Atypical chest pain   4. Essential hypertension      Plan:   In order of problems listed above:  1. Bradycardia/Palpitations - She does report intermittent palpitations with associated dizziness and is unaware of any precipitating factors. She has also been checking her HR at home ad it has been variable from the 40's to 90's. Will plan for a 2-week cardiac event monitor to assess for any significant arrhythmias.  Will also update labs to include CBC, BMET and TSH given her palpitations and also fatigue and cold intolerances.   2. Atypical Chest Pain - She describes episodes of feeling like "bees are stinging her chest" which occurs at rest. Prior NST in 03/2020 was low-risk. Overall, her recent pain seems atypical for an ischemic etiology. Prior Chest CT in 03/2020 showed minimal atherosclerotic calcification along the aortic arch but no mention of coronary calcifications. The report did mention pectus excavatum resulting in mass effect on the right ventricle (RV had normal function function by prior echo in 2013) so updating an echocardiogram may be a reasonable next step if event monitor is unrevealing.   3. HTN - Reports a history of this but no longer on Amlodipine as readings improved at home. BP is at 138/84 during today's visit. She was encouraged to continue to follow readings at home.    Medication Adjustments/Labs and Tests Ordered: Current medicines are reviewed at length with the patient today.  Concerns regarding medicines are outlined above.  Medication changes, Labs and Tests ordered today are listed in the Patient Instructions below. Patient Instructions  Medication Instructions:  Your physician recommends that you continue on your current medications as directed. Please refer to the Current Medication list given to you today.  *If you need a refill on your cardiac medications before your next appointment, please call your pharmacy*   Lab Work: CBC BMET TSH  If you have labs (blood work) drawn today and your tests are completely normal, you will receive your results only by: Marland Kitchen MyChart Message (if you have MyChart) OR . A paper copy in the mail If you have any lab test that is abnormal or we need to change your treatment, we will call you to review the results.   Testing/Procedures: None   Follow-Up: At [facility], you and your health needs are our priority.  As part of  our continuing mission to provide you with exceptional heart care, we have created designated Provider Care Teams.  These Care Teams include your primary Cardiologist (physician) and Advanced Practice Providers (APPs -  Physician Assistants and Nurse Practitioners) who all work together to provide you with the care you need, when you need it.  We recommend signing up for the patient portal called "MyChart".  Sign up information is  provided on this After Visit Summary.  MyChart is used to connect with patients for Virtual Visits (Telemedicine).  Patients are able to view lab/test results, encounter notes, upcoming appointments, etc.  Non-urgent messages can be sent to your provider as well.   To learn more about what you can do with MyChart, go to [email or web address].    Your next appointment:   6 week(s)  The format for your next appointment:   In Person  Provider:   Dr Gwenlyn Found at [facility] [place]   Other Instructions ZIO AT  . Call [place] with any questions during wear 24/7: [phone] . Zio patch should be worn for 14 days unless otherwise instructed . The [employer] [facility]) will call you 24-48 hrs. after Elwyn Reach is applied, be sure to answer the phone from a 224 area code.  They may call during wear with important information regarding your heart, so answer any calls from [employer] . Do not shower for 24 hours after Zio is applied (sponge bath is fine, just keep the patch dry) . After that, when showering avoid direct shower stream of water onto Zio patch, pat dry around the patch . Avoid excessive sweating . Push the button when you are feeling any cardiac symptoms and record them in the logbook or on the Zio app . Refer to the removal date listed on the front of the symptom logbook and remove Zio patch according to the instructions in the back of the logbook . Place Zio patch inside transmitter (sticky side facing up, button facing down) and put both the  gateway and symptom logbook in the prepaid mailing envelope included in the back of the transmitter.  Place inside mailbox or any USPS mailbox       Signed, Kayla Terry, Hershal Coria  01/16/2021 8:17 AM    [place] [address] [place], [place] [address] Phone: [phone] Fax: [phone]

## 2021-01-16 ENCOUNTER — Encounter: Payer: Self-pay | Admitting: Student

## 2021-01-18 ENCOUNTER — Other Ambulatory Visit (HOSPITAL_COMMUNITY)
Admission: RE | Admit: 2021-01-18 | Discharge: 2021-01-18 | Disposition: A | Discharge status: Home / Self Care | Payer: Medicare Other | Source: Ambulatory Visit | Attending: Student | Admitting: Student

## 2021-01-18 ENCOUNTER — Other Ambulatory Visit: Payer: Self-pay

## 2021-01-18 DIAGNOSIS — R002 Palpitations: Secondary | ICD-10-CM | POA: Diagnosis present

## 2021-01-18 LAB — CBC
HCT: 43.9 % (ref 36.0–46.0)
Hemoglobin: 14.2 g/dL (ref 12.0–15.0)
MCH: 29.5 pg (ref 26.0–34.0)
MCHC: 32.3 g/dL (ref 30.0–36.0)
MCV: 91.3 fL (ref 80.0–100.0)
Platelets: 262 10*3/uL (ref 150–400)
RBC: 4.81 MIL/uL (ref 3.87–5.11)
RDW: 13 % (ref 11.5–15.5)
WBC: 6.1 10*3/uL (ref 4.0–10.5)
nRBC: 0 % (ref 0.0–0.2)

## 2021-01-18 LAB — BASIC METABOLIC PANEL
Anion gap: 10 (ref 5–15)
BUN: 12 mg/dL (ref 8–23)
CO2: 27 mmol/L (ref 22–32)
Calcium: 9.6 mg/dL (ref 8.9–10.3)
Chloride: 102 mmol/L (ref 98–111)
Creatinine, Ser: 1.06 mg/dL — ABNORMAL HIGH (ref 0.44–1.00)
GFR, Estimated: 55 mL/min — ABNORMAL LOW (ref >60)
Glucose, Bld: 111 mg/dL — ABNORMAL HIGH (ref 70–99)
Potassium: 3.6 mmol/L (ref 3.5–5.1)
Sodium: 139 mmol/L (ref 135–145)

## 2021-01-18 LAB — TSH: TSH: 3.068 u[IU]/mL (ref 0.350–4.500)

## 2021-02-12 ENCOUNTER — Telehealth: Payer: Self-pay | Admitting: Student

## 2021-02-12 NOTE — Telephone Encounter (Signed)
Patients heart monitor has not been resulted yet. Patient verbalized understanding and will wait until monitor has been read for results.

## 2021-02-12 NOTE — Telephone Encounter (Signed)
Patient called requesting results of recent heart monitor. 

## 2021-02-25 NOTE — Progress Notes (Signed)
Virtual Visit via Telephone Note   This visit type was conducted due to national recommendations for restrictions regarding the COVID-19 Pandemic (e.g. social distancing) in an effort to limit this patient's exposure and mitigate transmission in our community.  Due to her co-morbid illnesses, this patient is at least at moderate risk for complications without adequate follow up.  This format is felt to be most appropriate for this patient at this time.  The patient did not have access to video technology/had technical difficulties with video requiring transitioning to audio format only (telephone).  All issues noted in this document were discussed and addressed.  No physical exam could be performed with this format.  Please refer to the patient's chart for her  consent to telehealth for [facility].   Date:  02/27/2021   ID:  Kayla Terry Kayla Terry, [name] [birth year], MRN [id number] The patient was identified using 2 identifiers.  Patient Location: Home Provider Location: Office/Clinic  PCP:  Christain Sacramento, [place]  Cardiologist:  Quay Burow, MD [phone]   Evaluation Performed:  Follow-Up Visit  Chief Complaint: 6-week visit  History of Present Illness:    Kayla Terry is a 75 y.o. female with past medical history of HTN (not currently on medical therapy), GERD, prior chest pain (low-risk NST in 03/2020) and bradycardia who presents for a 6-week follow-up telehealth visit.   She was last examined by myself in 12/2020 and reported a variety of symptoms but reported mostly generalized fatigue and decreased energy. Had undergone evaluation by GI, Neurology and Cardiology with no identifiable causes. Also reported an unintentional weight loss of 20 lbs. Her HR had also been in the 40's at times when checked at home, therefore it was recommended to recheck her labs and plan for a 2-week monitor to rule out any significant arrhythmias.   While her  monitor has not officially resulted, the prelim report shows NSR with an average HR of 67 bpm. She did have 1 episode of SVT for 5 beats but had a documented 9605 episodes of 2nd Degree Type 2 lasting a total duration of 1 day and 6 hours. Also noted to have isolated SVE's (<1.0%), SVE Couplets (<1.0%),  Isolated VEs (<1.0%), and no VE Couplets or VE Triplets. Reviewed with DOD and felt some strips were more consistent with PAC's but definitive episodes of 2nd Degree Type 2 noted as well.  In talking with the patient today, she reports switching to a Virtual Visit due to not feeling well today. Has been experiencing more back pain and was evaluated by her PCP for this a few weeks ago. She continues to have episodes of dizziness and feeling jittery. No specific triggers. Typically worse in the morning hours. No specific presyncope or syncopal episodes. No reported exertional chest pain or palpitations.   Her BP continues to be variable as it was at 164/109 this AM and rechecked around lunch and improved to 133/84.   Past Medical History:  Diagnosis Date  . Allergy   . B12 deficiency 04/2020  . Cataract   . GERD (gastroesophageal reflux disease)   . Herpes zoster without complications   . Hypertension   . Shingles    Past Surgical History:  Procedure Laterality Date  . ABDOMINAL HYSTERECTOMY  02/1984  . BACK SURGERY    . LUMBAR LAMINECTOMY    . OOPHORECTOMY  02/29/1984   unilateral     Current Meds  Medication Sig  .  gabapentin (NEURONTIN) 100 MG capsule Take 100 mg by mouth daily as needed (for restless legs).   . LORazepam (ATIVAN) 0.5 MG tablet Take 0.5 mg by mouth 2 (two) times daily as needed for anxiety.   . Multiple Vitamin (MULTIVITAMIN) tablet Take 1 tablet by mouth daily.  . vitamin B-12 (CYANOCOBALAMIN) 500 MCG tablet Take 500 mcg by mouth daily. MD ordered 1000 mcg po qd per patient, but patient is currently taking 500 mcg po qd  . zolpidem (AMBIEN) 10 MG tablet Take 5-10 mg  by mouth at bedtime as needed for sleep.   Current Facility-Administered Medications for the 02/26/21 encounter (Telemedicine) with Erma Heritage, PA-C  Medication  . gi cocktail (Maalox,Lidocaine,Donnatal)     Allergies:   Escitalopram, Pantoprazole, and Naproxen sodium   Social History   Tobacco Use  . Smoking status: Never Smoker  . Smokeless tobacco: Never Used  Vaping Use  . Vaping Use: Never used  Substance Use Topics  . Alcohol use: No  . Drug use: No     Family Hx: The patient's family history includes Alcohol abuse in her brother; Cancer in her mother; Leukemia in her mother; Lung cancer in her father; Multiple myeloma in her brother and father; Multiple sclerosis in her sister. There is no history of Colon cancer, Colon polyps, Stomach cancer, or Esophageal cancer.  ROS:   Please see the history of present illness.     All other systems reviewed and are negative.   Prior CV studies:   The following studies were reviewed today:  Echocardiogram: 08/2012 Study Conclusions   - Left ventricle: The cavity size was normal. Wall thickness  was normal. Systolic function was normal. The estimated  ejection fraction was in the range of 55% to 60%. Wall  motion was normal; there were no regional wall motion  abnormalities. Features are consistent with a pseudonormal  left ventricular filling pattern, with concomitant  abnormal relaxation and increased filling pressure (grade  2 diastolic dysfunction).  - Mitral valve: Mild regurgitation.  - Atrial septum: No defect or patent foramen ovale was  identified.   NST: 03/2020  The left ventricular ejection fraction is normal (55-65%).  Nuclear stress EF: 60%.  There was no ST segment deviation noted during stress.  There is a small defect of mild severity present in the basal inferoseptal, mid anteroseptal and mid inferoseptal location. The defect is non-reversible and worse on rest imaging. This  is consistent with breast attenuation artifact. No ischemia.  This is a low risk study.   Event Monitor: 12/2020 Patch Wear Time:  13 days and 17 hours (2022-03-18T15:44:35-0400 to 2022-04-01T08:57:03-399)  Patient had a min HR of 32 bpm, max HR of 162 bpm, and avg HR of 67 bpm. Predominant underlying rhythm was Sinus Rhythm. First Degree AV Block was present. Bundle Branch Block/IVCD was present. QRS morphology changes were present throughout recording.  Slight P wave morphology changes were noted. 1 run of Supraventricular Tachycardia occurred lasting 5 beats with a max rate of 133 bpm (avg 111 bpm). 9605 episode(s) of AV Block (2nd Mobitz II) occurred, lasting a total of 1 day 6 hours. AV Block  (2nd Mobitz II) was detected within +/- 45 seconds of symptomatic patient event(s). Isolated SVEs were rare (<1.0%), SVE Couplets were rare (<1.0%), and no SVE Triplets were present. Isolated VEs were rare (<1.0%), and no VE Couplets or VE Triplets  were present. MD notification criteria for Symptomatic Second Degree AV Block, Mobitz II met -  report posted prior to notification per account request (DI).   Labs/Other Tests and Data Reviewed:    EKG:  No ECG reviewed.  Recent Labs: 05/06/2020: Magnesium 2.2 08/04/2020: ALT 20 01/18/2021: BUN 12; Creatinine, Ser 1.06; Hemoglobin 14.2; Platelets 262; Potassium 3.6; Sodium 139; TSH 3.068   Recent Lipid Panel No results found for: CHOL, TRIG, HDL, CHOLHDL, LDLCALC, LDLDIRECT  Wt Readings from Last 3 Encounters:  02/26/21 125 lb (56.7 kg)  01/15/21 133 lb 12.8 oz (60.7 kg)  09/18/20 133 lb (60.3 kg)     Objective:    Vital Signs:  BP 133/84   Pulse 90   Ht '5\' 3"'  (1.6 m)   Wt 125 lb (56.7 kg)   BMI 22.14 kg/m    General: Pleasant female sounding in NAD Psych: Normal affect. Neuro: Alert and oriented X 3. Lungs:  Resp regular and unlabored while talking on the phone.    ASSESSMENT & PLAN:    1. 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 Domenic Polite (DOD). Some strips labeled as 2nd Degree Type 2 appear most consistent with PAC's but she did have definitive episodes of 2nd Degree Type 2 AV Block as well with most episodes occurring in the overnight hours (patient unaware of any history of OSA) but also episodes between 0900 - 1000 and the patient reports she is awake at those times.   - Given her monitor results, will refer to EP for further evaluation as she Kayla require PPM placement. Recent labs were without acute findings. Continue to avoid AV nodal blocking agents.   2. Atypical Chest Pain - Recent NST in 03/2020 was low-risk and she denies any exertional symptoms. Chest CT in 03/2020 did show pectus excavatum with mass effect on the RV and a repeat echo could be considered in the future (normal function and wall thickness by echo in 2013). Continue with risk factor modification.    3. HTN - BP has been variable when checked at home as outlined above. At 133/84 on most recent check. Currently off Amlodipine given improvement in her readings.   4. Unintentional Weight Loss - She has lost 20+ lbs within the past year unintentionally and reports associated cold intolerances. Reports being evaluated by Neurology and GI with no culprit identified. TSH recently rechecked and WNL. I reviewed with the patient today I do not think her intermittent bradycardia would account for this. Recommend continued evaluation and follow-up with her PCP.   Time:   Today, I have spent 19 minutes with the patient with telehealth technology discussing the above problems.     Medication Adjustments/Labs and Tests Ordered: Current medicines are reviewed at length with the patient today.  Concerns regarding medicines are outlined above.   Tests Ordered: Orders Placed This Encounter  Procedures  . Ambulatory referral to Cardiac Electrophysiology    Medication Changes: No  orders of the defined types were placed in this encounter.   Follow Up: With EP here in [place], [place]  Signed, Waynetta Pean  02/27/2021 2:47 PM    [place]

## 2021-02-26 ENCOUNTER — Telehealth: Payer: Self-pay | Admitting: Student

## 2021-02-26 ENCOUNTER — Telehealth: Payer: Self-pay

## 2021-02-26 ENCOUNTER — Encounter: Payer: Self-pay | Admitting: Student

## 2021-02-26 ENCOUNTER — Other Ambulatory Visit: Payer: Self-pay

## 2021-02-26 ENCOUNTER — Telehealth (INDEPENDENT_AMBULATORY_CARE_PROVIDER_SITE_OTHER): Payer: Medicare Other | Admitting: Student

## 2021-02-26 VITALS — BP 133/84 | HR 90 | Ht 63.0 in | Wt 125.0 lb

## 2021-02-26 DIAGNOSIS — I441 Atrioventricular block, second degree: Secondary | ICD-10-CM

## 2021-02-26 DIAGNOSIS — I10 Essential (primary) hypertension: Secondary | ICD-10-CM | POA: Diagnosis not present

## 2021-02-26 DIAGNOSIS — R001 Bradycardia, unspecified: Secondary | ICD-10-CM

## 2021-02-26 DIAGNOSIS — R0789 Other chest pain: Secondary | ICD-10-CM

## 2021-02-26 DIAGNOSIS — R634 Abnormal weight loss: Secondary | ICD-10-CM

## 2021-02-26 NOTE — Telephone Encounter (Signed)
New message     Patient calling she is not feeling well and does not want to come in office, changing appt to virtual ,  She would also like her monitor results

## 2021-02-26 NOTE — Patient Instructions (Signed)
Medication Instructions:  Your physician recommends that you continue on your current medications as directed. Please refer to the Current Medication list given to you today.  *If you need a refill on your cardiac medications before your next appointment, please call your pharmacy*   Lab Work: None If you have labs (blood work) drawn today and your tests are completely normal, you will receive your results only by: Marland Kitchen MyChart Message (if you have MyChart) OR . A paper copy in the mail If you have any lab test that is abnormal or we need to change your treatment, we will call you to review the results.   Testing/Procedures: None   Follow-Up: At [facility], you and your health needs are our priority.  As part of our continuing mission to provide you with exceptional heart care, we have created designated Provider Care Teams.  These Care Teams include your primary Cardiologist (physician) and Advanced Practice Providers (APPs -  Physician Assistants and Nurse Practitioners) who all work together to provide you with the care you need, when you need it.  We recommend signing up for the patient portal called "MyChart".  Sign up information is provided on this After Visit Summary.  MyChart is used to connect with patients for Virtual Visits (Telemedicine).  Patients are able to view lab/test results, encounter notes, upcoming appointments, etc.  Non-urgent messages can be sent to your provider as well.   To learn more about what you can do with MyChart, go to [email or web address].     Other Instructions Referral to EP- Dr. Ladona Ridgel

## 2021-02-26 NOTE — Telephone Encounter (Signed)
Pt returned call and voiced understanding that results were not available as of yet.

## 2021-02-26 NOTE — Telephone Encounter (Signed)
  Patient Consent for Virtual Visit         Kayla Terry has provided verbal consent on 02/26/2021 for a virtual visit (video or telephone).   CONSENT FOR VIRTUAL VISIT FOR:  Kayla Terry  By participating in this virtual visit I agree to the following:  I hereby voluntarily request, consent and authorize [facility] and its employed or contracted physicians, physician assistants, nurse practitioners or other licensed health care professionals (the Practitioner), to provide me with telemedicine health care services (the "Services") as deemed necessary by the treating Practitioner. I acknowledge and consent to receive the Services by the Practitioner via telemedicine. I understand that the telemedicine visit will involve communicating with the Practitioner through live audiovisual communication technology and the disclosure of certain medical information by electronic transmission. I acknowledge that I have been given the opportunity to request an in-person assessment or other available alternative prior to the telemedicine visit and am voluntarily participating in the telemedicine visit.  I understand that I have the right to withhold or withdraw my consent to the use of telemedicine in the course of my care at any time, without affecting my right to future care or treatment, and that the Practitioner or I may terminate the telemedicine visit at any time. I understand that I have the right to inspect all information obtained and/or recorded in the course of the telemedicine visit and may receive copies of available information for a reasonable fee.  I understand that some of the potential risks of receiving the Services via telemedicine include:  Marland Kitchen Delay or interruption in medical evaluation due to technological equipment failure or disruption; . Information transmitted may not be sufficient (e.g. poor resolution of images) to allow for appropriate medical decision making by the  Practitioner; and/or  . In rare instances, security protocols could fail, causing a breach of personal health information.  Furthermore, I acknowledge that it is my responsibility to provide information about my medical history, conditions and care that is complete and accurate to the best of my ability. I acknowledge that Practitioner's advice, recommendations, and/or decision may be based on factors not within their control, such as incomplete or inaccurate data provided by me or distortions of diagnostic images or specimens that may result from electronic transmissions. I understand that the practice of medicine is not an exact science and that Practitioner makes no warranties or guarantees regarding treatment outcomes. I acknowledge that a copy of this consent can be made available to me via my patient portal [facility] MyChart), or I can request a printed copy by calling the office of [facility].    I understand that my insurance will be billed for this visit.   I have read or had this consent read to me. . I understand the contents of this consent, which adequately explains the benefits and risks of the Services being provided via telemedicine.  . I have been provided ample opportunity to ask questions regarding this consent and the Services and have had my questions answered to my satisfaction. . I give my informed consent for the services to be provided through the use of telemedicine in my medical care

## 2021-02-26 NOTE — Telephone Encounter (Signed)
Returned patient call with no answer. Left a message to have patient return a call to our office.   Pt monitor has not been resulted as of now. We will give pt those results when it is read by the provider.

## 2021-02-27 ENCOUNTER — Encounter: Payer: Self-pay | Admitting: Student

## 2021-03-03 ENCOUNTER — Ambulatory Visit: Payer: Medicare Other | Admitting: Neurology

## 2021-03-03 ENCOUNTER — Encounter: Payer: Self-pay | Admitting: Neurology

## 2021-03-03 ENCOUNTER — Other Ambulatory Visit: Payer: Self-pay

## 2021-03-03 VITALS — BP 154/98 | HR 80 | Ht 63.0 in | Wt 134.0 lb

## 2021-03-03 DIAGNOSIS — M5000 Cervical disc disorder with myelopathy, unspecified cervical region: Secondary | ICD-10-CM | POA: Diagnosis not present

## 2021-03-03 DIAGNOSIS — E538 Deficiency of other specified B group vitamins: Secondary | ICD-10-CM | POA: Diagnosis not present

## 2021-03-03 NOTE — Progress Notes (Addendum)
[facility]    Provider:  Dr Jaynee Eagles Primary Care Provider:  Christain Sacramento, MD  CC: Ongoing weakness not progressive, stable  HPI 03/03/2021: Patient seen in the past for headaches and weakness. Today seen for weakness, stable, she feels cold all the time and has had weight loss and I apologized and explained that she should really see her primary care about this, not sure I have anymore neurologically to offer. I have seen her in the past for stable weakness, and she has had MRI brain, cervical spine and emg/ncs. 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 She saw Dr. Vertell Limber and he recommended surgery. She didn't have it completed, she declined.  She states no new symptoms, no of the same symptoms, she has been to different doctors since she saw me. She stays cold and she is "jittery" nervous (TSH is normal). She saw cardiology and she was evaluated and she has further evaluation on June 1st.   Patient complains of symptoms per HPI as well as the following symptoms: weight loss, feels cold. . Pertinent negatives and positives per HPI. All others negative   HPI:  Kayla Terry is a 75 y.o. female here as requested by Irene Pap DO for worsening headache.  She has a past medical history of GERD, herpes zoster without complications, hypertension,chronic pain, hyperlipidemia, restless leg syndrome, nocturia, chest tightness, dizziness.  I reviewed Irene Pap DO's notes: Patient was last seen Mar 12, 2020, she described ear crackling and dizziness, she had previously been to the emergency room for dizziness and chest pain and troponins were negative, negative chest x-ray, negative lab work, negative MRI of the head (was a limited MR), patient's blood pressure was elevated at the time of patient was started on amlodipine 5 mg daily.  She was diagnosed with possible GERD and started on medication.  When patient was  last seen she stated her symptoms were still persistent, blood pressure between 114 and 710 at home systolic, she was given recent eyedrops for potential glaucoma and was concerned that this could be a potential side effect of the eyedrops, she complains of dizziness and headache in the back of the head at that time for 6 days with a recent developing of left ear crackling without any ear drainage, decrease in hearing, or ear pain had a slightly clear runny nose but sore throat, coughing, dizziness feels like the room is spinning, can happen with sitting or standing, can last for hours no vomiting but she does have nausea.Shawn Lazoff DO's examination showed patient in no distress, normal head, eyes, nose, throat however she had impacted cerumen in the left ear which was removed; also cardiovascular, pulmonary, abdominal, musculoskeletal, neurologic and psychiatric examination was normal, negative Dix-Hallpike maneuver, patient's dizziness did not appear to be orthostatic related to dizziness is persistent regardless of position, she was told to try stopping the eyedrops for 10 days, meclizine as needed.   I also reviewed emergency room notes recently seen April 15, 2020, she presented with ongoing weakness, chest pain, anorexia, nausea, started May 6, she had already been to the emergency room been evaluated by gastroenterology and had a stress test the day before, she still persistently has generalized weakness without focality, pressure in her sternum, associated anorexia, nausea, fatigue, no fever, no vomiting no shortness of breath, also had one course of doxycycline.  Her blood pressure was elevated 174/116 in the emergency  room, otherwise her examination showed that she was not in acute distress, rate and rhythm normal, cardiovascular, pulmonary, abdominal, neurologic all normal.  She had an extensive work-up including imaging of her abdomen pelvis and chest, there was some mass-effect on the right ventricle  but the emergency room did not think that this was significant and did further follow-up, they also reviewed the stress test, they diagnosed her with atypical chest pain.  CBC and BMP were normal.  They also checked her troponins, LFTs, D-dimer, lipase and discharged with a diagnosis of atypical chest pain.  Patient is here alone today and states that although she has been having headaches in the back of the head she is not concerned by that and her biggest concern is because her arms and legs have been feeling weak.  She has not been feeling well since 1 May.  She went to the hospital, her blood pressure was elevated, she was having pain in her chest, evaluation showed no cardiac etiology, she was started on blood pressure meds and came home since then she has been having a dull headache in the back of the head, Excedrin helps a little bit, her legs and arms stay weak, she can function but they fall asleep and her lips tingle. This all started in May at the emergency room. In early may she went to the hospital for elevated blood pressure and chest pain. She is now off of amlodipine.  Patient reports that the weakness predates all of this above, weakness ongoing for years and worsening recently. The weakness has been worsening. She was on doxycycline recently and she had tingling in the face and the legs and arms and unclear if this made her symptoms worse, ongoing slowly progressively worsening. She feels like her arms and legs are asleep. She can even be in bed and they feel asleep and she gets up and feels better. Worse in the left arm but all the limbs are affected, she denies any difficulty getting out of a chair or off of the floor, she can get to the top of the stairs but she feels out of breath but not due to weakness. She is generally weak all over. Slowly progressive. Numbness and tingling of all the limbs not in the face since stopping doxycycline, she has to take her time because she is afraid she is  going to fall. No muscle loss or weight loss. She has noticed very occasion action tremor of the left hand and she is more cold natured. Her husband died in 12-28-2022, she is on zolpidem and she take neurontin as well. She has chronic pain. headache is mild in the back of the head not positional or exertional. 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 No double vision, no droopy eyelids, no difficulty swallowing.   Reviewed notes, labs and imaging from outside physicians, which showed  I reviewed MRI of the brain report which was of poor quality, patient was unable to tolerate the full examination, and the images that were taken were severely motion degraded.  The limited study diffusion weighted showed no acute infarct.  Review of Systems: Patient complains of symptoms per HPI as well as the following symptoms: Insomnia, restless legs, headache, numbness, weakness, dizziness, fatigue, chest pain, feeling cold, decreased energy, change in appetite. Pertinent negatives and positives per HPI. All others negative.   Social History   Socioeconomic History  . Marital status: Widowed    Spouse name: Not on file  .  Number of children: Not on file  . Years of education: Not on file  . Highest education level: Not on file  Occupational History  . Not on file  Tobacco Use  . Smoking status: Never Smoker  . Smokeless tobacco: Never Used  Vaping Use  . Vaping Use: Never used  Substance and Sexual Activity  . Alcohol use: No  . Drug use: No  . Sexual activity: Not on file  Other Topics Concern  . Not on file  Social History Narrative   Lives alone    Her husband passed Feb 2021   Caffeine: maybe 1 cup/day, mostly water    Social Determinants of Health   Financial Resource Strain: Not on file  Food Insecurity: Not on file  Transportation Needs: Not on file  Physical Activity: Not on file  Stress: Not on file  Social Connections: Not on file  Intimate Partner  Violence: Not on file    Family History  Problem Relation Age of Onset  . Cancer Mother   . Leukemia Mother   . Lung cancer Father   . Multiple myeloma Father   . Multiple sclerosis Sister   . Alcohol abuse Brother   . Multiple myeloma Brother   . Colon cancer Neg Hx   . Colon polyps Neg Hx   . Stomach cancer Neg Hx   . Esophageal cancer Neg Hx     Past Medical History:  Diagnosis Date  . Allergy   . B12 deficiency 04/2020  . Cataract   . GERD (gastroesophageal reflux disease)   . Herpes zoster without complications   . Hypertension   . Shingles     Patient Active Problem List   Diagnosis Date Noted  . B12 deficiency 05/07/2020  . Muscle weakness 05/06/2020  . Benign essential hypertension 06/08/2017  . Acid reflux disease 03/08/2016  . Hyperlipidemia 03/08/2016  . Nocturia 03/08/2016  . Restless leg syndrome 03/08/2016  . Chest tightness 08/21/2012    Past Surgical History:  Procedure Laterality Date  . ABDOMINAL HYSTERECTOMY  02/1984  . BACK SURGERY    . CATARACT EXTRACTION, BILATERAL    . LUMBAR LAMINECTOMY    . OOPHORECTOMY  02/29/1984   unilateral    Current Outpatient Medications  Medication Sig Dispense Refill  . famotidine (PEPCID) 20 MG tablet Take 20 mg by mouth daily.    Marland Kitchen gabapentin (NEURONTIN) 100 MG capsule Take 100 mg by mouth daily as needed (for restless legs).     . LORazepam (ATIVAN) 0.5 MG tablet Take 0.5 mg by mouth 2 (two) times daily as needed for anxiety.     . Multiple Vitamin (MULTIVITAMIN) tablet Take 1 tablet by mouth daily.    . vitamin B-12 (CYANOCOBALAMIN) 500 MCG tablet Take 500 mcg by mouth daily. MD ordered 1000 mcg po qd per patient, but patient is currently taking 500 mcg po qd    . zolpidem (AMBIEN) 10 MG tablet Take 5-10 mg by mouth at bedtime as needed for sleep.     Current Facility-Administered Medications  Medication Dose Route Frequency Provider Last Rate Last Admin  . gi cocktail (Maalox,Lidocaine,Donnatal)  30  mL Oral Once Copland, Gay Filler, MD        Allergies as of 03/03/2021 - Review Complete 03/03/2021  Allergen Reaction Noted  . Escitalopram Other (See Comments) 12/02/2020  . Pantoprazole Other (See Comments) 12/02/2020  . Naproxen sodium Anxiety 12/24/2011    Vitals: BP (!) 154/98 (BP Location: Right Arm, Patient Position: Sitting)  Pulse 80   Ht '5\' 3"'  (1.6 m)   Wt 134 lb (60.8 kg)   BMI 23.74 kg/m  Last Weight:  Wt Readings from Last 1 Encounters:  03/03/21 134 lb (60.8 kg)   Last Height:   Ht Readings from Last 1 Encounters:  03/03/21 '5\' 3"'  (1.6 m)     Physical exam: Exam: Gen: NAD, conversant, well nourised, well groomed                     CV: RRR, no MRG. No Carotid Bruits. No peripheral edema, warm, nontender Eyes: Conjunctivae clear without exudates or hemorrhage  Neuro: Detailed Neurologic Exam  Speech:    Speech is normal; fluent and spontaneous with normal comprehension.  Cognition:    The patient is oriented to person, place, and time;     recent and remote memory intact;     language fluent;     normal attention, concentration,     fund of knowledge Cranial Nerves:    The pupils are equal, round, and reactive to light.  Visual fields are full to finger confrontation. Extraocular movements are intact. Trigeminal sensation is intact and the muscles of mastication are normal. The face is symmetric. The palate elevates in the midline. Hearing intact. Voice is normal. Shoulder shrug is normal. The tongue has normal motion without fasciculations.   Coordination:    No dysmetria or ataxia  Gait:    Heel-toe and tandem with minimal imbalance. Can stand without using her hands.   Motor Observation:    No asymmetry, no atrophy, and no involuntary movements noted. Tone:    Normal muscle tone.    Posture:    Posture is normal. normal erect    Strength:    Minimal proximal muscle weakness 5-/5 likely some poor effort     Sensation: intact to LT      Reflex Exam:  DTR's:    Deep tendon reflexes in the upper and lower extremities are brisk in all extremities bilaterally.   Toes:    The toes are downgoing bilaterally.   Clonus:    Clonus 2 beats at the AJ left and 3 on the right    Assessment/Plan:   75 y.o. female here as requested for stable weakness. 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 Vertell Limber but declined surgery. Today she states her symptoms are stable no progression but she states she feels cold all the time and has lost weight unintentionally.  I apologized and explained that she should really see her primary care about this, not sure I have anymore neurologically to offer her and I think she should follow up with Dr. Vertell Limber for her cervical myelopathy and have that followed and consider surgery. Her exam today is stable, she still has brisk reflexes, mild limb weakness, clonus. EMG/NCS was also completed of the left arm and leg which was normal 04/2020. Repeat B12 today. I recommended workup with her primary care, possibly cancer screening, and encouraged her to discuss with her primary care and I will of course send this note to him. She declines repeat MRI cervical spine. Exam today stable.   Orders Placed This Encounter  Procedures  . Vitamin B12   No orders of the defined types were placed in this encounter.   Cc: Christain Sacramento, MD  Sarina Ill, MD  [facility] [address] [place], [place] [address]  Phone [phone] Fax [phone]  I spent over 40 minutes of face-to-face and non-face-to-face time with patient on the  1. B12 deficiency    diagnosis.  This included previsit chart review, lab review, study review, order entry, electronic health record documentation, patient education on the different diagnostic and therapeutic options, counseling and coordination of care, risks and benefits of management, compliance, or risk factor  reduction

## 2021-03-03 NOTE — Patient Instructions (Addendum)
Recommend Follow up with neurosurgery and primary care

## 2021-03-04 ENCOUNTER — Telehealth: Payer: Self-pay | Admitting: *Deleted

## 2021-03-04 LAB — VITAMIN B12: Vitamin B-12: 725 pg/mL (ref 232–1245)

## 2021-03-04 NOTE — Telephone Encounter (Signed)
-----   Message from Anson Fret, MD sent at 03/04/2021 10:21 AM EDT ----- B12 normal, looks great thanks

## 2021-03-04 NOTE — Telephone Encounter (Signed)
Spoke with pt and let her know her B12 is normal. Pt verbalized understanding and appreciation.

## 2021-03-07 ENCOUNTER — Telehealth: Payer: Self-pay | Admitting: Neurology

## 2021-03-07 NOTE — Telephone Encounter (Signed)
Bethany: Can you please call patient and let her know that I reviewed her neurosurgery notes from [name] in October 2021.  At that time [name] did think that she was symptomatic and she had spinal cord compression and highly recommended she undergo an ACDF at the C5 and C6 and C6-C7 levels, she was supposed to return to the office with her son to again discuss risks and benefits of the surgery prior to proceeding.  I would like to remind the patient that he highly recommended it and so do I.  She has cervical spinal cord compression, worsening and quadriplegia is always a possibility.  If she likes we can also contact her son and discuss it. ty

## 2021-03-08 NOTE — Telephone Encounter (Signed)
Spoke with patient and discussed the message from Dr. Daisy Blossom.  Patient verbalized understanding and stated she would discuss with her son to see if he would give her the go-ahead to schedule an appointment with [name] for the son to come along and discuss the risks and benefits of the surgery.  The patient stated she had mentioned this to [name] but she was concerned because her sister had a surgery on her spine and ended up with neuropathy symptoms that caused worse pain after the surgery.  The patient had not been sure that the surgery helped her symptoms so she did not proceed.  The does understand however that this is highly recommended by Drs Venetia Maxon and Lucia Gaskins and she will speak with her son.  She declined our offer to call the son and said she would speak with him.  She verbalized appreciation for the call.

## 2021-03-10 ENCOUNTER — Ambulatory Visit: Payer: Medicare Other | Admitting: Neurology

## 2021-03-14 ENCOUNTER — Emergency Department (HOSPITAL_BASED_OUTPATIENT_CLINIC_OR_DEPARTMENT_OTHER)
Admission: EM | Admit: 2021-03-14 | Discharge: 2021-03-14 | Disposition: A | Discharge status: Home / Self Care | Payer: Medicare Other | Attending: Emergency Medicine | Admitting: Emergency Medicine

## 2021-03-14 ENCOUNTER — Encounter (HOSPITAL_BASED_OUTPATIENT_CLINIC_OR_DEPARTMENT_OTHER): Payer: Self-pay

## 2021-03-14 ENCOUNTER — Other Ambulatory Visit: Payer: Self-pay

## 2021-03-14 DIAGNOSIS — R300 Dysuria: Secondary | ICD-10-CM | POA: Insufficient documentation

## 2021-03-14 DIAGNOSIS — M545 Low back pain, unspecified: Secondary | ICD-10-CM | POA: Diagnosis not present

## 2021-03-14 DIAGNOSIS — R350 Frequency of micturition: Secondary | ICD-10-CM | POA: Insufficient documentation

## 2021-03-14 DIAGNOSIS — I10 Essential (primary) hypertension: Secondary | ICD-10-CM | POA: Insufficient documentation

## 2021-03-14 LAB — URINALYSIS, ROUTINE W REFLEX MICROSCOPIC
Bilirubin Urine: NEGATIVE
Glucose, UA: NEGATIVE mg/dL
Hgb urine dipstick: NEGATIVE
Ketones, ur: NEGATIVE mg/dL
Nitrite: NEGATIVE
Protein, ur: NEGATIVE mg/dL
Specific Gravity, Urine: <1.005 — ABNORMAL LOW (ref 1.005–1.030)
pH: 6 (ref 5.0–8.0)

## 2021-03-14 LAB — URINALYSIS, MICROSCOPIC (REFLEX): RBC / HPF: NONE SEEN RBC/hpf (ref 0–5)

## 2021-03-14 MED ORDER — CEPHALEXIN 500 MG PO CAPS: 500.0000 mg | ORAL_CAPSULE | Freq: Three times a day (TID) | ORAL | 0 refills | Status: AC

## 2021-03-14 NOTE — ED Triage Notes (Signed)
Urinary frequency and dysuria with associated lower back pain x 2 weeks.  Pt took a home UTI test and was positive.  PCP rx sulfa drug that she has been taking since Thursday without relief.  Also c/o feeling "crummy all over".

## 2021-03-14 NOTE — ED Provider Notes (Signed)
[facility] EMERGENCY DEPARTMENT Provider Note   CSN: [id number] Arrival date & time: 03/14/21  0750     History Chief Complaint  Patient presents with  . Urinary Frequency    Kayla Terry is a 75 y.o. female.  75 year old female with past medical history below including hypertension, GERD, cervical degenerative disc disease who presents with urinary symptoms.  Patient states that 2 weeks ago she began having urinary frequency and some dysuria.  She eventually took a home UTI test which was positive and she called PCP who gave her Bactrim.  She has been taking this antibiotic for 2 to 3 days but reports no improvement in her symptoms.  She reports she has developed pain across her lower back that involves both sides.  No abdominal pain currently.  She has had some nausea but no vomiting, fevers, or URI symptoms.  She states that she "feels crummy all over."  The history is provided by the patient.  Urinary Frequency       Past Medical History:  Diagnosis Date  . Allergy   . B12 deficiency 04/2020  . Cataract   . GERD (gastroesophageal reflux disease)   . Herpes zoster without complications   . Hypertension   . Shingles     Patient Active Problem List   Diagnosis Date Noted  . 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  . B12 deficiency 05/07/2020  . Muscle weakness 05/06/2020  . Benign essential hypertension 06/08/2017  . Acid reflux disease 03/08/2016  . Hyperlipidemia 03/08/2016  . Nocturia 03/08/2016  . Restless leg syndrome 03/08/2016  . Chest tightness 08/21/2012    Past Surgical History:  Procedure Laterality Date  . ABDOMINAL HYSTERECTOMY  02/1984  . BACK SURGERY    . CATARACT EXTRACTION, BILATERAL    . LUMBAR LAMINECTOMY    . OOPHORECTOMY  02/29/1984   unilateral     OB History   No obstetric history on file.     Family History  Problem Relation Age of Onset  . Cancer Mother   . Leukemia Mother   . Lung cancer Father    . Multiple myeloma Father   . Multiple sclerosis Sister   . Alcohol abuse Brother   . Multiple myeloma Brother   . Colon cancer Neg Hx   . Colon polyps Neg Hx   . Stomach cancer Neg Hx   . Esophageal cancer Neg Hx     Social History   Tobacco Use  . Smoking status: Never Smoker  . Smokeless tobacco: Never Used  Vaping Use  . Vaping Use: Never used  Substance Use Topics  . Alcohol use: No  . Drug use: No    Home Medications Prior to Admission medications   Medication Sig Start Date End Date Taking? Authorizing Provider  cephALEXin (KEFLEX) 500 MG capsule Take 1 capsule (500 mg total) by mouth 3 (three) times daily for 7 days. 03/14/21 03/21/21 Yes Avalyn Molino, Wenda Overland, MD  famotidine (PEPCID) 20 MG tablet Take 20 mg by mouth daily.   Yes [provider]  gabapentin (NEURONTIN) 100 MG capsule Take 100 mg by mouth daily as needed (for restless legs).    Yes [provider]  LORazepam (ATIVAN) 0.5 MG tablet Take 0.5 mg by mouth 2 (two) times daily as needed for anxiety.    Yes [provider]  Multiple Vitamin (MULTIVITAMIN) tablet Take 1 tablet by mouth daily.   Yes [provider]  vitamin B-12 (CYANOCOBALAMIN) 500 MCG tablet Take  500 mcg by mouth daily. MD ordered 1000 mcg po qd per patient, but patient is currently taking 500 mcg po qd   Yes [provider]  zolpidem (AMBIEN) 10 MG tablet Take 5-10 mg by mouth at bedtime as needed for sleep.   Yes [provider]    Allergies    Escitalopram, Pantoprazole, and Naproxen sodium  Review of Systems   Review of Systems  Genitourinary: Positive for frequency.   All other systems reviewed and are negative except that which was mentioned in HPI  Physical Exam Updated Vital Signs BP (!) 160/93 (BP Location: Right Arm)   Pulse 69   Temp 97.8 F (36.6 C) (Oral)   Resp 16   Ht '5\' 3"'  (1.6 m)   Wt 56.7 kg   SpO2 96%   BMI 22.14 kg/m   Physical Exam Constitutional:       General: She is not in acute distress.    Appearance: Normal appearance.  HENT:     Head: Normocephalic and atraumatic.  Eyes:     Conjunctiva/sclera: Conjunctivae normal.  Cardiovascular:     Rate and Rhythm: Normal rate and regular rhythm.     Heart sounds: Normal heart sounds. No murmur heard.   Pulmonary:     Effort: Pulmonary effort is normal.     Breath sounds: Normal breath sounds.  Abdominal:     General: Abdomen is flat. Bowel sounds are normal. There is no distension.     Palpations: Abdomen is soft.     Tenderness: There is no abdominal tenderness. There is no right CVA tenderness or left CVA tenderness.  Musculoskeletal:     Right lower leg: No edema.     Left lower leg: No edema.  Skin:    General: Skin is warm and dry.  Neurological:     Mental Status: She is alert and oriented to person, place, and time.     Comments: fluent  Psychiatric:        Mood and Affect: Mood normal.        Behavior: Behavior normal.     ED Results / Procedures / Treatments   Labs (all labs ordered are listed, but only abnormal results are displayed) Labs Reviewed  URINALYSIS, ROUTINE W REFLEX MICROSCOPIC - Abnormal; Notable for the following components:      Result Value   Color, Urine STRAW (*)    Specific Gravity, Urine <1.005 (*)    Leukocytes,Ua TRACE (*)    All other components within normal limits  URINALYSIS, MICROSCOPIC (REFLEX) - Abnormal; Notable for the following components:   Bacteria, UA RARE (*)    All other components within normal limits  URINE CULTURE    EKG None  Radiology No results found.  Procedures Procedures   Medications Ordered in ED Medications - No data to display  ED Course  I have reviewed the triage vital signs and the nursing notes.  Pertinent labs & imaging results that were available during my care of the patient were reviewed by me and considered in my medical decision making (see chart for details).    MDM  Rules/Calculators/A&P                          Alert and comfortable on exam with reassuring vital signs.  No focal abdominal tenderness.  She does not have any unilateral back pain or other symptoms that suggest kidney stone.  She has trace leukocytes and some  bacteria on UA, urine culture sent.  Vital signs and symptoms are reassuring against sepsis or pyelonephritis.  Because of increasing resistance patterns to Bactrim, will switch the patient to Keflex for better coverage.  I have recommended that she follow closely with her PCP and have extensively reviewed return precautions with her.  She voiced understanding. Final Clinical Impression(s) / ED Diagnoses Final diagnoses:  Dysuria  Bilateral low back pain without sciatica, unspecified chronicity    Rx / DC Orders ED Discharge Orders         Ordered    cephALEXin (KEFLEX) 500 MG capsule  3 times daily        03/14/21 0934           Shanyah Gattuso, Wenda Overland, MD 03/14/21 [phone]

## 2021-03-14 NOTE — Discharge Instructions (Addendum)
STOP TAKING BACTRIM DS.

## 2021-03-14 NOTE — ED Notes (Signed)
Pt discharged to home. Discharge instructions have been discussed with patient and/or family members. Pt verbally acknowledges understanding d/c instructions, and endorses comprehension to checkout at registration before leaving.  °

## 2021-03-14 NOTE — ED Notes (Signed)
Spoke with Kayla Terry in lab to add on urine culture

## 2021-03-15 LAB — URINE CULTURE: Culture: NO GROWTH

## 2021-03-19 ENCOUNTER — Emergency Department (HOSPITAL_COMMUNITY): Payer: Medicare Other

## 2021-03-19 ENCOUNTER — Encounter (HOSPITAL_COMMUNITY): Payer: Self-pay | Admitting: Emergency Medicine

## 2021-03-19 ENCOUNTER — Other Ambulatory Visit: Payer: Self-pay

## 2021-03-19 ENCOUNTER — Emergency Department (HOSPITAL_BASED_OUTPATIENT_CLINIC_OR_DEPARTMENT_OTHER): Payer: Medicare Other

## 2021-03-19 ENCOUNTER — Emergency Department (HOSPITAL_COMMUNITY)
Admission: EM | Admit: 2021-03-19 | Discharge: 2021-03-19 | Disposition: A | Discharge status: Home / Self Care | Payer: Medicare Other | Attending: Emergency Medicine | Admitting: Emergency Medicine

## 2021-03-19 DIAGNOSIS — R0789 Other chest pain: Secondary | ICD-10-CM | POA: Diagnosis not present

## 2021-03-19 DIAGNOSIS — I10 Essential (primary) hypertension: Secondary | ICD-10-CM | POA: Insufficient documentation

## 2021-03-19 DIAGNOSIS — I441 Atrioventricular block, second degree: Secondary | ICD-10-CM | POA: Insufficient documentation

## 2021-03-19 DIAGNOSIS — R079 Chest pain, unspecified: Secondary | ICD-10-CM

## 2021-03-19 DIAGNOSIS — Z79899 Other long term (current) drug therapy: Secondary | ICD-10-CM | POA: Diagnosis not present

## 2021-03-19 DIAGNOSIS — Z20822 Contact with and (suspected) exposure to covid-19: Secondary | ICD-10-CM | POA: Insufficient documentation

## 2021-03-19 DIAGNOSIS — K219 Gastro-esophageal reflux disease without esophagitis: Secondary | ICD-10-CM | POA: Diagnosis not present

## 2021-03-19 HISTORY — DX: Pectus excavatum: Q67.6

## 2021-03-19 HISTORY — DX: Atrioventricular block, second degree: I44.1

## 2021-03-19 HISTORY — DX: Solitary pulmonary nodule: R91.1

## 2021-03-19 HISTORY — DX: Diaphragmatic hernia without obstruction or gangrene: K44.9

## 2021-03-19 HISTORY — DX: Fatty (change of) liver, not elsewhere classified: K76.0

## 2021-03-19 LAB — CBC WITH DIFFERENTIAL/PLATELET
Abs Immature Granulocytes: 0.02 10*3/uL (ref 0.00–0.07)
Basophils Absolute: 0.1 10*3/uL (ref 0.0–0.1)
Basophils Relative: 1 %
Eosinophils Absolute: 0.1 10*3/uL (ref 0.0–0.5)
Eosinophils Relative: 1 %
HCT: 44.5 % (ref 36.0–46.0)
Hemoglobin: 14.8 g/dL (ref 12.0–15.0)
Immature Granulocytes: 0 %
Lymphocytes Relative: 19 %
Lymphs Abs: 1.4 10*3/uL (ref 0.7–4.0)
MCH: 30.1 pg (ref 26.0–34.0)
MCHC: 33.3 g/dL (ref 30.0–36.0)
MCV: 90.6 fL (ref 80.0–100.0)
Monocytes Absolute: 0.6 10*3/uL (ref 0.1–1.0)
Monocytes Relative: 8 %
Neutro Abs: 5.2 10*3/uL (ref 1.7–7.7)
Neutrophils Relative %: 71 %
Platelets: 331 10*3/uL (ref 150–400)
RBC: 4.91 MIL/uL (ref 3.87–5.11)
RDW: 13.2 % (ref 11.5–15.5)
WBC: 7.3 10*3/uL (ref 4.0–10.5)
nRBC: 0 % (ref 0.0–0.2)

## 2021-03-19 LAB — COMPREHENSIVE METABOLIC PANEL
ALT: 14 U/L (ref 0–44)
AST: 16 U/L (ref 15–41)
Albumin: 4.5 g/dL (ref 3.5–5.0)
Alkaline Phosphatase: 96 U/L (ref 38–126)
Anion gap: 11 (ref 5–15)
BUN: 8 mg/dL (ref 8–23)
CO2: 25 mmol/L (ref 22–32)
Calcium: 9.6 mg/dL (ref 8.9–10.3)
Chloride: 101 mmol/L (ref 98–111)
Creatinine, Ser: 0.98 mg/dL (ref 0.44–1.00)
GFR, Estimated: >60 mL/min (ref >60)
Glucose, Bld: 116 mg/dL — ABNORMAL HIGH (ref 70–99)
Potassium: 3.5 mmol/L (ref 3.5–5.1)
Sodium: 137 mmol/L (ref 135–145)
Total Bilirubin: 0.8 mg/dL (ref 0.3–1.2)
Total Protein: 7.4 g/dL (ref 6.5–8.1)

## 2021-03-19 LAB — RESP PANEL BY RT-PCR (FLU A&B, COVID) ARPGX2
Influenza A by PCR: NEGATIVE
Influenza B by PCR: NEGATIVE
SARS Coronavirus 2 by RT PCR: NEGATIVE

## 2021-03-19 LAB — URINALYSIS, ROUTINE W REFLEX MICROSCOPIC
Bilirubin Urine: NEGATIVE
Glucose, UA: NEGATIVE mg/dL
Hgb urine dipstick: NEGATIVE
Ketones, ur: NEGATIVE mg/dL
Leukocytes,Ua: NEGATIVE
Nitrite: NEGATIVE
Protein, ur: NEGATIVE mg/dL
Specific Gravity, Urine: 1.003 — ABNORMAL LOW (ref 1.005–1.030)
pH: 6 (ref 5.0–8.0)

## 2021-03-19 LAB — TROPONIN I (HIGH SENSITIVITY)
Troponin I (High Sensitivity): 3 ng/L (ref <18)
Troponin I (High Sensitivity): 3 ng/L (ref <18)

## 2021-03-19 LAB — ECHOCARDIOGRAM COMPLETE
AR max vel: 1.75 cm2
AV Area VTI: 2 cm2
AV Area mean vel: 2.03 cm2
AV Mean grad: 3.1 mmHg
AV Peak grad: 5.5 mmHg
Ao pk vel: 1.17 m/s
Area-P 1/2: 4.31 cm2
S' Lateral: 1.28 cm
Weight: 2000 oz

## 2021-03-19 LAB — TSH: TSH: 3.958 u[IU]/mL (ref 0.350–4.500)

## 2021-03-19 MED ORDER — IOHEXOL 350 MG/ML SOLN
100.0000 mL | Freq: Once | INTRAVENOUS | Status: AC | PRN
  Administered 2021-03-19: 100 mL via INTRAVENOUS

## 2021-03-19 NOTE — Discharge Instructions (Addendum)
  Follow-up with cardiology as scheduled.  Cleared by cardiology for discharge home.  Dr. Wyline Mood said it was okay for you to be discharged.  Return for any new or worse symptoms

## 2021-03-19 NOTE — Progress Notes (Signed)
*  PRELIMINARY RESULTS* Echocardiogram 2D Echocardiogram has been performed.  Kayla Terry 03/19/2021, 3:57 PM

## 2021-03-19 NOTE — ED Triage Notes (Signed)
Pt reports chest pain that radiates to her neck and back. Pt reports this started on Wednesday.

## 2021-03-19 NOTE — Consult Note (Addendum)
Cardiology Consultation:   Patient ID: Kayla Terry MRN: [id number]; DOB: 1946/05/18  Admit date: 03/19/2021 Date of Consult: 03/19/2021  PCP:  Kayla Sacramento, MD   [facility] Providers Cardiologist:  Kayla Burow, MD        Patient Profile:   Kayla Terry is a 75 y.o. female with a hx of pectus excavatum deformity ?resulting in mass effect on the right ventricle by CT 03/2020, HTN, acid reflux, hiatal hernia, B12 deficiency, recent Mobitz 2 AVB and SVT, pulmonary nodule by CT 03/2020, recent 20lb unintentional weight loss who is being seen 03/19/2021 for the evaluation of Mobitz 2 at the request of Kayla Terry.  History of Present Illness:   Kayla Terry was remotely evaluated in 2013 for chest pain with a reported negative stress test. 2D echo 2013 showed EF 55-60%, grade 2 DD, mild MR.  Her husband passed away in Dec 08, 2019. She states she hasn't been feeling well since 02/2020 with generalized fatigue and decreased energy. Up until that point she reports she was generally quite healthy with minimal medical problems. She was evaluated in 03/2020 by Dr. Gwenlyn Terry for atypical chest pain. CT chest 03/2020 showed pectus excavatum deformity resulting in mass effect on the RV, 18m LLL nodule (repeat CT 12 months if desired), aortic atherosclerosis. Repeat stress test showed defect c/w breast attenuation but otherwise no ischemia, low risk study, EF 60%. She recalls being told by someone that she could stop her BP medications. Chest pain was felt possibly GI in nature. Barium study showed slight esophageal dysmotility. She underwent EGD 04/2020 showing hiatal hernia but otherwise no obvious cause for symptoms. Abd [place]20[place] with possible fatty liver. [place] at which time she was noticing her HR would drop to the 40s-50s at times, associated with worsening dizziness and fatigue. She also noted episodic palpitations. She was still having a "bee stinging" type CP periodically. Also  of note she had noticed 20lb weight loss and significant cold intolerance over the prior year as well. TSH was normal. OP event monitor showed NSR/SB/ST with several episodes of Mobitz type 2 second degree AVB and short runs of SVT. She has not been on any AVN blocking agents. She was subsequently referred to EP as outpatient, appt pending 6/1.  She has continued to have the same symptoms as noted above with generalized fatigue, but has also been noticing intermittent back pain. She saw primary care as OP recently and was tx with Bactrim. The pain worsened so she went to an outside hospital and the ER changed her to Cephalexin. Usually in the past the back pain would resolve going about her day. However, yesterday she noticed lower chest pain/upper abdominal pain that wrapped all the way around to her back and persisted incessantly. It was not associated with any specific symptoms and did not worsen or alleviate with anything. She would also notice a periodic sensation of awareness in focal area upper mid chest. The discomfort usually goes away but this time it persisted, especially with her back pain. It has subsided to some degree but she still feels discomfort in her back. No CP presently. No dyspnea, near-syncope or syncope. In the ED she was noted to have episodic Mobitz 2 AVB similar to what was demonstrated on monitor. She is hemodynamically stable. Troponins neg x 2, glucose 116 otherwise labs nonacute, UA clear, CXR NAD with normal mediastinal contours.   Past Medical History:  Diagnosis Date  . Allergy   .  B12 deficiency 04/2020  . Cataract   . Fatty liver   . GERD (gastroesophageal reflux disease)   . Herpes zoster without complications   . Hiatal hernia   . Hypertension   . Mild mitral regurgitation 2013  . Mobitz type 2 second degree atrioventricular block   . Pectus excavatum    pectus excavatum deformity resulting in mass effect on the RV by CT 03/2020  . Pulmonary nodule   .  Shingles     Past Surgical History:  Procedure Laterality Date  . ABDOMINAL HYSTERECTOMY  02/1984  . BACK SURGERY    . CATARACT EXTRACTION, BILATERAL    . LUMBAR LAMINECTOMY    . OOPHORECTOMY  02/29/1984   unilateral     Home Medications:  Prior to Admission medications   Medication Sig Start Date End Date Taking? Authorizing Provider  cephALEXin (KEFLEX) 500 MG capsule Take 1 capsule (500 mg total) by mouth 3 (three) times daily for 7 days. 03/14/21 03/21/21 Yes Terry, Kayla Overland, MD  gabapentin (NEURONTIN) 100 MG capsule Take 100-200 mg by mouth daily as needed (for restless legs).   Yes [provider]  Lansoprazole (PREVACID PO) Take 1 tablet by mouth daily.   Yes [provider]  LORazepam (ATIVAN) 0.5 MG tablet Take 0.5 mg by mouth 2 (two) times daily as needed for anxiety.    Yes [provider]  Multiple Vitamin (MULTIVITAMIN) tablet Take 1 tablet by mouth daily.   Yes [provider]  vitamin B-12 (CYANOCOBALAMIN) 500 MCG tablet Take 500 mcg by mouth daily.   Yes [provider]  zolpidem (AMBIEN) 10 MG tablet Take 10 mg by mouth at bedtime.   Yes [provider]  famotidine (PEPCID) 20 MG tablet Take 20 mg by mouth daily as needed for heartburn.    [provider]    Inpatient Medications: Scheduled Meds: . gi cocktail  30 mL Oral Once   Continuous Infusions:  PRN Meds:   Allergies:    Allergies  Allergen Reactions  . Escitalopram Other (See Comments)    Nausea and dizziness  . Pantoprazole Other (See Comments)    headaches  . Naproxen Sodium Anxiety    Social History:   Social History   Socioeconomic History  . Marital status: Widowed    Spouse name: Not on file  . Number of children: Not on file  . Years of education: Not on file  . Highest education level: Not on file  Occupational History  . Not on file  Tobacco Use  . Smoking status: Never Smoker  . Smokeless tobacco: Never Used   Vaping Use  . Vaping Use: Never used  Substance and Sexual Activity  . Alcohol use: No  . Drug use: No  . Sexual activity: Not on file  Other Topics Concern  . Not on file  Social History Narrative   Lives alone    Her husband passed Feb 2021   Caffeine: maybe 1 cup/day, mostly water    Social Determinants of Health   Financial Resource Strain: Not on file  Food Insecurity: Not on file  Transportation Needs: Not on file  Physical Activity: Not on file  Stress: Not on file  Social Connections: Not on file  Intimate Partner Violence: Not on file    Family History:    Family History  Problem Relation Age of Onset  . Cancer Mother   . Leukemia Mother   . Lung cancer Father   . Multiple myeloma  Father   . Multiple sclerosis Sister   . Alcohol abuse Brother   . Multiple myeloma Brother   . Colon cancer Neg Hx   . Colon polyps Neg Hx   . Stomach cancer Neg Hx   . Esophageal cancer Neg Hx      ROS:  Please see the history of present illness.  All other ROS reviewed and negative.     Physical Exam/Data:   Vitals:   03/19/21 0915 03/19/21 0930 03/19/21 1000 03/19/21 1030  BP:  (!) 157/77 (!) 177/92 (!) 180/94  Pulse: 67 63 70 66  Resp: 18 18 (!) 23 20  Temp:      TempSrc:      SpO2: 96% 97% 100% 98%  Weight:       No intake or output data in the 24 hours ending 03/19/21 1048 Last 3 Weights 03/19/2021 03/14/2021 03/03/2021  Weight (lbs) 125 lb 125 lb 134 lb  Weight (kg) 56.7 kg 56.7 kg 60.782 kg     Body mass index is 22.14 kg/m.  General: Well developed, well nourished WF in no acute distress. Head: Normocephalic, atraumatic, sclera non-icteric, no xanthomas, nares are without discharge. Neck: Negative for carotid bruits. JVP not elevated. Lungs: Clear bilaterally to auscultation without wheezes, rales, or rhonchi. Breathing is unlabored. Heart: Irregular with variable rate, S1 S2 without murmurs, rubs, or gallops.  Abdomen: Soft, non-tender, non-distended with  normoactive bowel sounds. No rebound/guarding. Extremities: No clubbing or cyanosis. No edema. Distal pedal pulses are 2+ and equal bilaterally. Neuro: Alert and oriented X 3. Moves all extremities spontaneously. Psych:  Responds to questions appropriately with a normal affect.  EKG:  The EKG was personally reviewed and demonstrates:  The left side of the EKG appears to show a wandering atrial pacemaker/ectopic atrial rhythm followed by Mobitz 2 AVB with 2:1 heart block and underlying LBBB, LAD  Telemetry:  Telemetry was personally reviewed and demonstrates:  Similar findings as EKG  Relevant CV Studies: Event monitor 3-01/2021 Patch Wear Time:  13 days and 17 hours (2022-03-18T15:44:35-0400 to 2022-04-01T08:57:03-399)  Patient had a min HR of 32 bpm, max HR of 162 bpm, and avg HR of 67 bpm. Predominant underlying rhythm was Sinus Rhythm. First Degree AV Block was present. Bundle Arlean Thies Block/IVCD was present. QRS morphology changes were present throughout recording.  Slight P wave morphology changes were noted. 1 run of Supraventricular Tachycardia occurred lasting 5 beats with a max rate of 133 bpm (avg 111 bpm). 9605 episode(s) of AV Block (2nd Mobitz II) occurred, lasting a total of 1 day 6 hours. AV Block  (2nd Mobitz II) was detected within +/- 45 seconds of symptomatic patient event(s). Isolated SVEs were rare (<1.0%), SVE Couplets were rare (<1.0%), and no SVE Triplets were present. Isolated VEs were rare (<1.0%), and no VE Couplets or VE Triplets  were present. MD notification criteria for Symptomatic Second Degree AV Block, Mobitz II met - report posted prior to notification per account request (DI).   1. SR/SB/ST 2. Mobitz Type 2 second degeee AVB 3. Short runs of SVT   NST 03/2020  The left ventricular ejection fraction is normal (55-65%).  Nuclear stress EF: 60%.  There was no ST segment deviation noted during stress.  There is a small defect of mild severity  present in the basal inferoseptal, mid anteroseptal and mid inferoseptal location. The defect is non-reversible and worse on rest imaging. This is consistent with breast attenuation artifact. No ischemia.  This is a low  risk study.    2D echo 2013 - Left ventricle: The cavity size was normal. Wall thickness  was normal. Systolic function was normal. The estimated  ejection fraction was in the range of 55% to 60%. Wall  motion was normal; there were no regional wall motion  abnormalities. Features are consistent with a pseudonormal  left ventricular filling pattern, with concomitant  abnormal relaxation and increased filling pressure (grade  2 diastolic dysfunction).  - Mitral valve: Mild regurgitation.  - Atrial septum: No defect or patent foramen ovale was  identified.   Laboratory Data:  High Sensitivity Troponin:   Recent Labs  Lab 03/19/21 0755 03/19/21 1010  TROPONINIHS 3 3     Chemistry Recent Labs  Lab 03/19/21 0755  NA 137  K 3.5  CL 101  CO2 25  GLUCOSE 116*  BUN 8  CREATININE 0.98  CALCIUM 9.6  GFRNONAA >60  ANIONGAP 11    Recent Labs  Lab 03/19/21 0755  PROT 7.4  ALBUMIN 4.5  AST 16  ALT 14  ALKPHOS 96  BILITOT 0.8   Hematology Recent Labs  Lab 03/19/21 0755  WBC 7.3  RBC 4.91  HGB 14.8  HCT 44.5  MCV 90.6  MCH 30.1  MCHC 33.3  RDW 13.2  PLT 331   BNPNo results for input(s): BNP, PROBNP in the last 168 hours.  DDimer No results for input(s): DDIMER in the last 168 hours.   Radiology/Studies:  DG Chest Port 1 View  Result Date: 03/19/2021 CLINICAL DATA:  Chest pain EXAM: PORTABLE CHEST 1 VIEW COMPARISON:  10/22/2020 FINDINGS: Heart and mediastinal contours are within normal limits. No focal opacities or effusions. No acute bony abnormality. IMPRESSION: No active disease. Electronically Signed   By: Rolm Baptise M.D.   On: 03/19/2021 08:59     Assessment and Plan:   1. Back pain/chest pain - presenting complaint  - no objective evidence of cardiac ischemia thus far and symptoms seem atypical for cardiac pain - also of concern is her 20lb weight loss and cold intolerance over the last year - I do think her fatigue is due in part to her low HR, but the weight loss and back pain cannot specifically be explained by the bradycardia - there was some question of prior pectus excavatum deformity resulting in mass effect on the RV demonstrated on prior CT - ? If this is contributing - CXR normal mediastinal contours - prior CT 03/2020 with pulmonary nodule, may need f/u scan at discretion of medical team - will review further with MD  2. 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 Recent PSVT demonstrated on monitor - avoid AVN blocking agents until bradycardia is treated, conservative mgmt for now  4. Essential HTN - would be cautious about lowing BP too low until baseline bradycardia is addressed  5. Aortic atherosclerosis - consider initiation of statin at some point, can be OP once acute issues sorted out  Risk Assessment/Risk Scores:     HEAR Score (for undifferentiated chest pain):     For questions or updates, please contact [place] Please consult [email or web address] for contact info under    Signed, Charlie Pitter, PA-C  03/19/2021 10:48 AM   Attending note Patient seen and discussed with PA Dunn, I agree with her documentation. 75  yo female history of HTN, GERD, chest pain, bradycardia, unintentional weight loss 20 lbs presents with chest pain. Describes a band like aching pain under both breast shooting to her mid to lower back. 5/10 severity, not positional. Some associated SOB. Had sone nausea, vomited once after trying to eat. Pain has last constant over 24 hours though some variability in severity and  location. While in ER noted to have episodes of bradycardia, cardiology consulted for chest pain and bradycardia.   Prior home monitor with episodes of 2nd degree mobitz II, was to f/u with outpatient EP.    132/88 p 40 94% WBC 7.3 Hgb 14.8 Plt 331 K 3.5 Cr 0.98 TSH 3.9  Trop 3-->3 COVID neg EKG wandering atrial rhythm, intermittent mobitz 2 block, LBBB  01/2021 monitor: short runs SVT, mobitz type 2 episodes.  03/2020 nuclear stress: no ischemia 2013 monitor LVEF 55-60%, no WMAs, grade II dd, mild MR  Several year history of chest pain according to notes. Prior evaluations by Dr Acie Fredrickson 2009 and 2013, Dr Kayla Terry in 2021. Stress tests and prior echos have been benign. Current symptoms are atypical for ischemia lasting constant over 24 hours, band like feeling under bilateral breasts shooting into mid and lower back. , enzymes are negative. Stress test 2021 was negative. At this point no evidence to support ischemia or indicate further workup. With her HTN in ER SBP to 190s, chest pain raditing to back will check a CTA to look for dissection, check echo.    The bradycardia and heart block have been worked up as outpatient with upcoming outpatient EP evaluation. In absence of significant symptoms I do not see a reason to change this strategy to inpatient EP evaluation, as she presented with chest pain as opposed to symptomatic bradycardia. Keep outpatient ep appt   F/u CT and echo, if benign no further cardiac workup and could be discharged from ER  Carlyle Dolly MD

## 2021-03-19 NOTE — ED Provider Notes (Addendum)
[facility] EMERGENCY DEPARTMENT Provider Note   CSN: [id number] Arrival date & time: 03/19/21  [id number]     History Chief Complaint  Patient presents with  . Chest Pain    Kayla Terry is a 75 y.o. female.  Patient here for complaint of chest pain.  Started at 3 in the morning on Thursday.  Bilateral lower anterior chest below both breasts.  And then spreads to the rest of the chest.  Also some radiation intermittently up into the neck area.  Patient followed by cardiology seen March 18.  They arranged for cardiac home monitoring.  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  Patient given referral to EP but does not have appointment for June 1.  Patient states that she has not felt well for months.  Patient seen at [facility] on May 15 for concerns for persistent urinary tract infection.  Patient had been treated with Septra by her primary care doctor.  Urinalysis on the 15th without any significant abnormalities she was switched to Keflex just out of carefulness.  Culture was done from that day.  The culture is showing no growth.  Patient is concerned that she still has a urinary tract infection.  Past medical history significant for muscle weakness, hypertension, reflux disease, hyperlipidemia, restless leg syndrome.  And chest tightness.  Patient with previous work-up for chest pain with echocardiogram and nonexercise stress test without acute findings.  Cardiac monitor here does show second-degree AV block Mobitz type II with a heart rate as low as 30.  But she is not in it continuously.        Past Medical History:  Diagnosis Date  . Allergy   . B12 deficiency 04/2020  . Cataract   . GERD (gastroesophageal reflux disease)   . Herpes zoster without complications   . Hypertension   . Shingles     Patient Active Problem List   Diagnosis Date Noted  . 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 B12 deficiency 05/07/2020  . Muscle weakness 05/06/2020  . Benign essential hypertension 06/08/2017  . Acid reflux disease 03/08/2016  . Hyperlipidemia 03/08/2016  . Nocturia 03/08/2016  . Restless leg syndrome 03/08/2016  . Chest tightness 08/21/2012    Past Surgical History:  Procedure Laterality Date  . ABDOMINAL HYSTERECTOMY  02/1984  . BACK SURGERY    . CATARACT EXTRACTION, BILATERAL    . LUMBAR LAMINECTOMY    . OOPHORECTOMY  02/29/1984   unilateral     OB History   No obstetric history on file.     Family History  Problem Relation Age of Onset  . Cancer Mother   . Leukemia Mother   . Lung cancer Father   . Multiple myeloma Father   . Multiple sclerosis Sister   . Alcohol abuse Brother   . Multiple myeloma Brother   . Colon cancer Neg Hx   . Colon polyps Neg Hx   . Stomach cancer Neg Hx   . Esophageal cancer Neg Hx     Social History   Tobacco Use  . Smoking status: Never Smoker  . Smokeless tobacco: Never Used  Vaping Use  . Vaping Use: Never used  Substance Use Topics  . Alcohol use: No  . Drug use: No    Home Medications Prior to Admission medications   Medication Sig Start Date End Date Taking? Authorizing Provider  cephALEXin (KEFLEX) 500 MG  capsule Take 1 capsule (500 mg total) by mouth 3 (three) times daily for 7 days. 03/14/21 03/21/21 Yes Little, Wenda Overland, MD  gabapentin (NEURONTIN) 100 MG capsule Take 100-200 mg by mouth daily as needed (for restless legs).   Yes [provider]  Lansoprazole (PREVACID PO) Take 1 tablet by mouth daily.   Yes [provider]  LORazepam (ATIVAN) 0.5 MG tablet Take 0.5 mg by mouth 2 (two) times daily as needed for anxiety.    Yes [provider]  Multiple Vitamin (MULTIVITAMIN) tablet Take 1 tablet by mouth daily.   Yes [provider]  vitamin B-12 (CYANOCOBALAMIN) 500 MCG tablet Take 500 mcg by mouth daily.   Yes [provider]   zolpidem (AMBIEN) 10 MG tablet Take 10 mg by mouth at bedtime.   Yes [provider]  famotidine (PEPCID) 20 MG tablet Take 20 mg by mouth daily as needed for heartburn.    [provider]    Allergies    Escitalopram, Pantoprazole, and Naproxen sodium  Review of Systems   Review of Systems  Constitutional: Positive for fatigue. Negative for chills and fever.  HENT: Negative for rhinorrhea and sore throat.   Eyes: Negative for visual disturbance.  Respiratory: Negative for cough and shortness of breath.   Cardiovascular: Positive for chest pain. Negative for leg swelling.  Gastrointestinal: Negative for abdominal pain, diarrhea, nausea and vomiting.  Genitourinary: Negative for dysuria.  Musculoskeletal: Positive for neck pain. Negative for back pain.  Skin: Negative for rash.  Neurological: Positive for weakness. Negative for dizziness, light-headedness and headaches.  Hematological: Does not bruise/bleed easily.  Psychiatric/Behavioral: Negative for confusion.    Physical Exam Updated Vital Signs BP (!) 177/92   Pulse 70   Temp (!) 97.5 F (36.4 C) (Oral)   Resp (!) 23   Wt 56.7 kg   SpO2 100%   BMI 22.14 kg/m   Physical Exam Vitals and nursing note reviewed.  Constitutional:      General: She is not in acute distress.    Appearance: Normal appearance. She is well-developed.  HENT:     Head: Normocephalic and atraumatic.  Eyes:     Extraocular Movements: Extraocular movements intact.     Conjunctiva/sclera: Conjunctivae normal.     Pupils: Pupils are equal, round, and reactive to light.  Cardiovascular:     Rate and Rhythm: Bradycardia present. Rhythm irregular.     Heart sounds: No murmur heard.   Pulmonary:     Effort: Pulmonary effort is normal. No respiratory distress.     Breath sounds: Normal breath sounds.  Abdominal:     Palpations: Abdomen is soft.     Tenderness: There is no abdominal tenderness.  Musculoskeletal:         General: No swelling.     Cervical back: Normal range of motion and neck supple.  Skin:    General: Skin is warm and dry.     Capillary Refill: Capillary refill takes less than 2 seconds.  Neurological:     General: No focal deficit present.     Mental Status: She is alert and oriented to person, place, and time.     Cranial Nerves: No cranial nerve deficit.     Sensory: No sensory deficit.     Motor: No weakness.     ED Results / Procedures / Treatments   Labs (all labs ordered are listed, but only abnormal results are displayed) Labs Reviewed  COMPREHENSIVE METABOLIC PANEL - Abnormal;  Notable for the following components:      Result Value   Glucose, Bld 116 (*)    All other components within normal limits  URINALYSIS, ROUTINE W REFLEX MICROSCOPIC - Abnormal; Notable for the following components:   Color, Urine STRAW (*)    Specific Gravity, Urine 1.003 (*)    All other components within normal limits  RESP PANEL BY RT-PCR (FLU A&B, COVID) [id number]  CBC WITH DIFFERENTIAL/PLATELET  TSH  TROPONIN I (HIGH SENSITIVITY)  TROPONIN I (HIGH SENSITIVITY)    EKG EKG Interpretation  Date/Time:  Friday Mar 19 2021 08:00:23 EDT Ventricular Rate:  72 PR Interval:  177 QRS Duration: 128 QT Interval:  458 QTC Calculation: 502 R Axis:   -68 Text Interpretation: Second degree AV block, Mobitz II Sinus pause Left bundle branch block Confirmed by Fredia Sorrow [phone]) on 03/19/2021 8:13:11 AM   Radiology DG Chest Port 1 View  Result Date: 03/19/2021 CLINICAL DATA:  Chest pain EXAM: PORTABLE CHEST 1 VIEW COMPARISON:  10/22/2020 FINDINGS: Heart and mediastinal contours are within normal limits. No focal opacities or effusions. No acute bony abnormality. IMPRESSION: No active disease. Electronically Signed   By: Rolm Baptise M.D.   On: 03/19/2021 08:59    Procedures Procedures   CRITICAL CARE Performed by: Fredia Sorrow Total critical care time: 40 minutes Critical care time  was exclusive of separately billable procedures and treating other patients. Critical care was necessary to treat or prevent imminent or life-threatening deterioration. Critical care was time spent personally by me on the following activities: development of treatment plan with patient and/or surrogate as well as nursing, discussions with consultants, evaluation of patient's response to treatment, examination of patient, obtaining history from patient or surrogate, ordering and performing treatments and interventions, ordering and review of laboratory studies, ordering and review of radiographic studies, pulse oximetry and re-evaluation of patient's condition.   Medications Ordered in ED Medications - No data to display  ED Course  I have reviewed the triage vital signs and the nursing notes.  Pertinent labs & imaging results that were available during my care of the patient were reviewed by me and considered in my medical decision making (see chart for details).    MDM Rules/Calculators/A&P                         Initial work-up for the chest pain troponin normal chest x-ray negative urinalysis negative.  No leukocytosis no anemia.  Complete metabolic panel including liver function tests without any acute findings other than a blood sugar of 116.  Second troponin is pending.  COVID influenza testing negative.  Thyroid-stimulating hormone pending.  Patient was moved to room 2 because of the second-degree AV block.  For additional monitoring.  Less frequent episodes of the Mobitz type II there.  But still occurring.  Spoke with cardiology physician assistant who will talk to Dr. Harl Bowie and they will see the patient in consultation.  Again second troponin pending.  Second troponin is back is also 3 so no significant change with troponins.  Will await cardiology's recommendation.  Final Clinical Impression(s) / ED Diagnoses Final diagnoses:  Chest pain, unspecified type  Mobitz type 2  second degree AV block    Rx / DC Orders ED Discharge Orders    None       Fredia Sorrow, MD 03/19/21 1037    Fredia Sorrow, MD 03/19/21 1044  Patient cleared by cardiology for discharge home.  They  did do some additional radiology studies.  They have her set up for EP follow-up.  They feel her chest pain is atypical chest pain.  Cardiology repeated her echocardiogram.  They also did's CT angio chest and abdomen rule out aortic aneurysm or dissection.  No acute findings.  Results for orders placed or performed during the hospital encounter of 03/19/21  Resp Panel by RT-PCR (Flu A&B, Covid) Nasopharyngeal Swab   Specimen: Nasopharyngeal Swab; Nasopharyngeal(NP) swabs in vial transport medium  Result Value Ref Range   SARS Coronavirus 2 by RT PCR NEGATIVE NEGATIVE   Influenza A by PCR NEGATIVE NEGATIVE   Influenza B by PCR NEGATIVE NEGATIVE  CBC with Differential/Platelet  Result Value Ref Range   WBC 7.3 4.0 - 10.5 K/uL   RBC 4.91 3.87 - 5.11 MIL/uL   Hemoglobin 14.8 12.0 - 15.0 g/dL   HCT 44.5 36.0 - 46.0 %   MCV 90.6 80.0 - 100.0 fL   MCH 30.1 26.0 - 34.0 pg   MCHC 33.3 30.0 - 36.0 g/dL   RDW 13.2 11.5 - 15.5 %   Platelets 331 150 - 400 K/uL   nRBC 0.0 0.0 - 0.2 %   Neutrophils Relative % 71 %   Neutro Abs 5.2 1.7 - 7.7 K/uL   Lymphocytes Relative 19 %   Lymphs Abs 1.4 0.7 - 4.0 K/uL   Monocytes Relative 8 %   Monocytes Absolute 0.6 0.1 - 1.0 K/uL   Eosinophils Relative 1 %   Eosinophils Absolute 0.1 0.0 - 0.5 K/uL   Basophils Relative 1 %   Basophils Absolute 0.1 0.0 - 0.1 K/uL   Immature Granulocytes 0 %   Abs Immature Granulocytes 0.02 0.00 - 0.07 K/uL  Comprehensive metabolic panel  Result Value Ref Range   Sodium 137 135 - 145 mmol/L   Potassium 3.5 3.5 - 5.1 mmol/L   Chloride 101 98 - 111 mmol/L   CO2 25 22 - 32 mmol/L   Glucose, Bld 116 (H) 70 - 99 mg/dL   BUN 8 8 - 23 mg/dL   Creatinine, Ser 0.98 0.44 - 1.00 mg/dL   Calcium 9.6 8.9 - 10.3  mg/dL   Total Protein 7.4 6.5 - 8.1 g/dL   Albumin 4.5 3.5 - 5.0 g/dL   AST 16 15 - 41 U/L   ALT 14 0 - 44 U/L   Alkaline Phosphatase 96 38 - 126 U/L   Total Bilirubin 0.8 0.3 - 1.2 mg/dL   GFR, Estimated >60 >60 mL/min   Anion gap 11 5 - 15  Urinalysis, Routine w reflex microscopic Urine, Clean Catch  Result Value Ref Range   Color, Urine STRAW (A) YELLOW   APPearance CLEAR CLEAR   Specific Gravity, Urine 1.003 (L) 1.005 - 1.030   pH 6.0 5.0 - 8.0   Glucose, UA NEGATIVE NEGATIVE mg/dL   Hgb urine dipstick NEGATIVE NEGATIVE   Bilirubin Urine NEGATIVE NEGATIVE   Ketones, ur NEGATIVE NEGATIVE mg/dL   Protein, ur NEGATIVE NEGATIVE mg/dL   Nitrite NEGATIVE NEGATIVE   Leukocytes,Ua NEGATIVE NEGATIVE  TSH  Result Value Ref Range   TSH 3.958 0.350 - 4.500 uIU/mL  ECHOCARDIOGRAM COMPLETE  Result Value Ref Range   Weight 2,000 oz   BP 173/80 mmHg   Area-P 1/2 4.31 cm2   S' Lateral 1.28 cm   AR max vel 1.75 cm2   AV Area mean vel 2.03 cm2   AV Area VTI 2.00 cm2   Ao pk vel 1.17 m/s  AV Peak grad 5.5 mmHg   AV Mean grad 3.1 mmHg  Troponin I (High Sensitivity)  Result Value Ref Range   Troponin I (High Sensitivity) 3 <18 ng/L  Troponin I (High Sensitivity)  Result Value Ref Range   Troponin I (High Sensitivity) 3 <18 ng/L   DG Chest Port 1 View  Result Date: 03/19/2021 CLINICAL DATA:  Chest pain EXAM: PORTABLE CHEST 1 VIEW COMPARISON:  10/22/2020 FINDINGS: Heart and mediastinal contours are within normal limits. No focal opacities or effusions. No acute bony abnormality. IMPRESSION: No active disease. Electronically Signed   By: Rolm Baptise M.D.   On: 03/19/2021 08:59   ECHOCARDIOGRAM COMPLETE  Result Date: 03/19/2021    ECHOCARDIOGRAM REPORT   Patient Name:   Kayla Terry Date of Exam: 03/19/2021 Medical Rec #:  [id number]      Height:       63.0 in Accession #:    [id number]     Weight:       125.0 lb Date of Birth:  10/13/1946      BSA:          1.584 m Patient Age:    78  years       BP:           173/80 mmHg Patient Gender: F              HR:           30 bpm. Exam Location:  [facility] Procedure: 2D Echo Indications:    Chest Pain R07.9  History:        Patient has prior history of Echocardiogram examinations, most                 recent 09/12/2012. Risk Factors:Non-Smoker, Hypertension and                 Dyslipidemia. Chest tightness, Mobitz type 2 second degree                 atrioventricular block, pectus excavatum deformity resulting in                 mass effect on the RV.  Sonographer:    Leavy Cella RDCS (AE) Referring Phys: [id number] [place]  1. Left ventricular ejection fraction, by estimation, is 60 to 65%. The left ventricle has normal function. The left ventricle has no regional wall motion abnormalities. There is mild left ventricular hypertrophy. Left ventricular diastolic parameters are consistent with Grade I diastolic dysfunction (impaired relaxation).  2. Right ventricular systolic function is normal. The right ventricular size is normal.  3. The mitral valve is normal in structure. No evidence of mitral valve regurgitation. No evidence of mitral stenosis.  4. The aortic valve was not well visualized. There is mild calcification of the aortic valve. There is mild thickening of the aortic valve. Aortic valve regurgitation is not visualized. No aortic stenosis is present. FINDINGS  Left Ventricle: Left ventricular ejection fraction, by estimation, is 60 to 65%. The left ventricle has normal function. The left ventricle has no regional wall motion abnormalities. The left ventricular internal cavity size was normal in size. There is  mild left ventricular hypertrophy. Left ventricular diastolic parameters are consistent with Grade I diastolic dysfunction (impaired relaxation). Normal left ventricular filling pressure. Right Ventricle: The right ventricular size is normal. No increase in right ventricular wall thickness. Right ventricular  systolic function is normal. Left Atrium: Left atrial size was normal in size.  Right Atrium: Right atrial size was normal in size. Pericardium: There is no evidence of pericardial effusion. Mitral Valve: The mitral valve is normal in structure. There is mild thickening of the mitral valve leaflet(s). There is mild calcification of the mitral valve leaflet(s). Mild mitral annular calcification. No evidence of mitral valve regurgitation. No evidence of mitral valve stenosis. Tricuspid Valve: The tricuspid valve is normal in structure. Tricuspid valve regurgitation is not demonstrated. No evidence of tricuspid stenosis. Aortic Valve: The aortic valve was not well visualized. There is mild calcification of the aortic valve. There is mild thickening of the aortic valve. There is mild aortic valve annular calcification. Aortic valve regurgitation is not visualized. No aortic stenosis is present. Aortic valve mean gradient measures 3.1 mmHg. Aortic valve peak gradient measures 5.5 mmHg. Aortic valve area, by VTI measures 2.00 cm. Pulmonic Valve: The pulmonic valve was not well visualized. Pulmonic valve regurgitation is not visualized. No evidence of pulmonic stenosis. Aorta: The aortic root is normal in size and structure. Pulmonary Artery: Indeterminate PASP, inadequate TR jet. Venous: The inferior vena cava was not well visualized. IAS/Shunts: The interatrial septum was not well visualized.  LEFT VENTRICLE PLAX 2D LVIDd:         2.17 cm  Diastology LVIDs:         1.28 cm  LV e' medial:    5.87 cm/s LV PW:         1.10 cm  LV E/e' medial:  8.6 LV IVS:        1.14 cm  LV e' lateral:   5.44 cm/s LVOT diam:     1.70 cm  LV E/e' lateral: 9.3 LV SV:         48 LV SV Index:   31 LVOT Area:     2.27 cm  RIGHT VENTRICLE RV S prime:     10.70 cm/s TAPSE (M-mode): 2.3 cm LEFT ATRIUM           Index       RIGHT ATRIUM          Index LA diam:      2.90 cm 1.83 cm/m  RA Area:     9.75 cm LA Vol (A2C): 22.7 ml 14.34 ml/m RA  Volume:   17.90 ml 11.30 ml/m LA Vol (A4C): 25.4 ml 16.04 ml/m  AORTIC VALVE AV Area (Vmax):    1.75 cm AV Area (Vmean):   2.03 cm AV Area (VTI):     2.00 cm AV Vmax:           117.12 cm/s AV Vmean:          83.582 cm/s AV VTI:            0.241 m AV Peak Grad:      5.5 mmHg AV Mean Grad:      3.1 mmHg LVOT Vmax:         90.30 cm/s LVOT Vmean:        74.700 cm/s LVOT VTI:          0.213 m LVOT/AV VTI ratio: 0.88  AORTA Ao Root diam: 3.30 cm MITRAL VALVE MV Area (PHT): 4.31 cm    SHUNTS MV Decel Time: 176 msec    Systemic VTI:  0.21 m MV E velocity: 50.40 cm/s  Systemic Diam: 1.70 cm MV A velocity: 65.50 cm/s MV E/A ratio:  0.77 Carlyle Dolly MD Electronically signed by Carlyle Dolly MD Signature Date/Time: 03/19/2021/4:02:57 PM    Final  CT Angio Chest/Abd/Pel for Dissection W and/or W/WO  Result Date: 03/19/2021 CLINICAL DATA:  75 year old female with chest/back pain concerning for aortic dissection. EXAM: CT ANGIOGRAPHY CHEST, ABDOMEN AND PELVIS TECHNIQUE: Non-contrast CT of the chest was initially obtained. Multidetector CT imaging through the chest, abdomen and pelvis was performed using the standard protocol during bolus administration of intravenous contrast. Multiplanar reconstructed images and MIPs were obtained and reviewed to evaluate the vascular anatomy. CONTRAST:  174m OMNIPAQUE IOHEXOL 350 MG/ML SOLN COMPARISON:  CT chest 04/15/2020; CT abdomen/pelvis 03/19/2020 FINDINGS: CTA CHEST FINDINGS Cardiovascular: No evidence of high attenuation within the aortic media on the initial noncontrast enhanced images to suggest the presence of acute intramural hematoma. No evidence of acute dissection. The aortic root, ascending, transverse and descending thoracic aorta are normal in caliber. Trace calcified atherosclerotic plaque. The main pulmonary artery is normal in size. No large pulmonary embolus. The heart is normal in size although slightly irregular in contour secondary to pectus excavatum  deformity of the sternum. No pericardial effusion. Mediastinum/Nodes: No enlarged mediastinal, hilar, or axillary lymph nodes. Thyroid gland, trachea, and esophagus demonstrate no significant findings. Lungs/Pleura: Lungs are clear. No pleural effusion or pneumothorax. Musculoskeletal: Inward depression of the xiphoid process resulting in pectus excavatum. No acute osseous abnormality. Review of the MIP images confirms the above findings. CTA ABDOMEN AND PELVIS FINDINGS VASCULAR Aorta: No evidence of aneurysm or acute dissection. 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 Scattered atherosclerotic plaque. Celiac: Patent without evidence of aneurysm, dissection, vasculitis or significant stenosis. SMA: Patent without evidence of aneurysm, dissection, vasculitis or significant stenosis. Renals: Both renal arteries are patent without evidence of aneurysm, dissection, vasculitis, fibromuscular dysplasia or significant stenosis. IMA: Patent without evidence of aneurysm, dissection, vasculitis or significant stenosis. Inflow: Patent without evidence of aneurysm, dissection, vasculitis or significant stenosis. Veins: No focal venous abnormality. Review of the MIP images confirms the above findings. NON-VASCULAR Hepatobiliary: No focal liver abnormality is seen. No gallstones, gallbladder wall thickening, or biliary dilatation. Pancreas: Unremarkable. No pancreatic ductal dilatation or surrounding inflammatory changes. Spleen: Normal in size without focal abnormality. Adrenals/Urinary Tract: Adrenal glands are unremarkable. Kidneys are normal, without renal calculi, focal lesion, or hydronephrosis. Bladder is unremarkable. Stomach/Bowel: Stomach is within normal limits. No evidence of bowel wall thickening, distention, or inflammatory changes. Colonic diverticular disease without CT evidence of active inflammation. Lymphatic: No  suspicious lymphadenopathy. Reproductive: Status post hysterectomy. No adnexal masses. Other: No abdominal wall hernia or abnormality. No abdominopelvic ascites. Musculoskeletal: No acute fracture or aggressive appearing lytic or blastic osseous lesion. Review of the MIP images confirms the above findings. IMPRESSION: 1. No evidence of acute aortic dissection, aneurysm or other acute vascular pathology. 2. Scattered atherosclerotic vascular calcifications and plaque. 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 This is highly unlikely to be the source of any clinical symptoms. Aortic Atherosclerosis (ICD10-I70.0). 3. No acute abnormality within the abdomen or pelvis. 4. Pectus excavatum noted incidentally. Electronically Signed   By: HJacqulynn CadetM.D.   On: 03/19/2021 13:33        ZFredia Sorrow MD 03/19/21 [phone]

## 2021-03-19 NOTE — ED Notes (Signed)
Put pt on a 12 lead monitor 

## 2021-03-25 ENCOUNTER — Encounter (HOSPITAL_COMMUNITY): Payer: Self-pay | Admitting: *Deleted

## 2021-03-25 ENCOUNTER — Emergency Department (HOSPITAL_COMMUNITY): Payer: Medicare Other

## 2021-03-25 ENCOUNTER — Other Ambulatory Visit: Payer: Self-pay

## 2021-03-25 ENCOUNTER — Observation Stay (HOSPITAL_COMMUNITY)
Admission: EM | Admit: 2021-03-25 | Discharge: 2021-03-26 | Disposition: A | Discharge status: Home / Self Care | Payer: Medicare Other | Attending: Cardiology | Admitting: Cardiology

## 2021-03-25 DIAGNOSIS — R001 Bradycardia, unspecified: Secondary | ICD-10-CM | POA: Diagnosis not present

## 2021-03-25 DIAGNOSIS — I441 Atrioventricular block, second degree: Principal | ICD-10-CM | POA: Diagnosis present

## 2021-03-25 DIAGNOSIS — R531 Weakness: Secondary | ICD-10-CM | POA: Diagnosis present

## 2021-03-25 DIAGNOSIS — R634 Abnormal weight loss: Secondary | ICD-10-CM

## 2021-03-25 DIAGNOSIS — Z20822 Contact with and (suspected) exposure to covid-19: Secondary | ICD-10-CM | POA: Insufficient documentation

## 2021-03-25 DIAGNOSIS — Z79899 Other long term (current) drug therapy: Secondary | ICD-10-CM | POA: Diagnosis not present

## 2021-03-25 DIAGNOSIS — I10 Essential (primary) hypertension: Secondary | ICD-10-CM | POA: Diagnosis not present

## 2021-03-25 DIAGNOSIS — Q8789 Other specified congenital malformation syndromes, not elsewhere classified: Secondary | ICD-10-CM

## 2021-03-25 LAB — CBC
HCT: 40.4 % (ref 36.0–46.0)
Hemoglobin: 13.3 g/dL (ref 12.0–15.0)
MCH: 30 pg (ref 26.0–34.0)
MCHC: 32.9 g/dL (ref 30.0–36.0)
MCV: 91.2 fL (ref 80.0–100.0)
Platelets: 253 10*3/uL (ref 150–400)
RBC: 4.43 MIL/uL (ref 3.87–5.11)
RDW: 13.3 % (ref 11.5–15.5)
WBC: 5.7 10*3/uL (ref 4.0–10.5)
nRBC: 0 % (ref 0.0–0.2)

## 2021-03-25 LAB — COMPREHENSIVE METABOLIC PANEL
ALT: 14 U/L (ref 0–44)
AST: 19 U/L (ref 15–41)
Albumin: 3.9 g/dL (ref 3.5–5.0)
Alkaline Phosphatase: 90 U/L (ref 38–126)
Anion gap: 9 (ref 5–15)
BUN: 10 mg/dL (ref 8–23)
CO2: 25 mmol/L (ref 22–32)
Calcium: 9.2 mg/dL (ref 8.9–10.3)
Chloride: 105 mmol/L (ref 98–111)
Creatinine, Ser: 0.99 mg/dL (ref 0.44–1.00)
GFR, Estimated: 59 mL/min — ABNORMAL LOW (ref >60)
Glucose, Bld: 118 mg/dL — ABNORMAL HIGH (ref 70–99)
Potassium: 3.4 mmol/L — ABNORMAL LOW (ref 3.5–5.1)
Sodium: 139 mmol/L (ref 135–145)
Total Bilirubin: 0.4 mg/dL (ref 0.3–1.2)
Total Protein: 6.6 g/dL (ref 6.5–8.1)

## 2021-03-25 LAB — URINALYSIS, ROUTINE W REFLEX MICROSCOPIC
Bilirubin Urine: NEGATIVE
Glucose, UA: NEGATIVE mg/dL
Hgb urine dipstick: NEGATIVE
Ketones, ur: NEGATIVE mg/dL
Leukocytes,Ua: NEGATIVE
Nitrite: NEGATIVE
Protein, ur: NEGATIVE mg/dL
Specific Gravity, Urine: 1.003 — ABNORMAL LOW (ref 1.005–1.030)
pH: 7 (ref 5.0–8.0)

## 2021-03-25 LAB — LIPASE, BLOOD: Lipase: 33 U/L (ref 11–51)

## 2021-03-25 LAB — RESP PANEL BY RT-PCR (FLU A&B, COVID) ARPGX2
Influenza A by PCR: NEGATIVE
Influenza B by PCR: NEGATIVE
SARS Coronavirus 2 by RT PCR: NEGATIVE

## 2021-03-25 LAB — MAGNESIUM: Magnesium: 2.1 mg/dL (ref 1.7–2.4)

## 2021-03-25 LAB — T4, FREE: Free T4: 0.57 ng/dL — ABNORMAL LOW (ref 0.61–1.12)

## 2021-03-25 LAB — TROPONIN I (HIGH SENSITIVITY)
Troponin I (High Sensitivity): 3 ng/L (ref <18)
Troponin I (High Sensitivity): 4 ng/L (ref <18)

## 2021-03-25 MED ORDER — ENOXAPARIN SODIUM 40 MG/0.4ML IJ SOSY
40.0000 mg | PREFILLED_SYRINGE | INTRAMUSCULAR | Status: DC
  Filled 2021-03-25: qty 0.4

## 2021-03-25 MED ORDER — FAMOTIDINE 20 MG PO TABS: 20.0000 mg | ORAL_TABLET | Freq: Every day | ORAL | Status: DC | PRN

## 2021-03-25 MED ORDER — HYDRALAZINE HCL 10 MG PO TABS: 10.0000 mg | ORAL_TABLET | Freq: Three times a day (TID) | ORAL | Status: DC | PRN

## 2021-03-25 MED ORDER — NIFEDIPINE ER OSMOTIC RELEASE 30 MG PO TB24
30.0000 mg | ORAL_TABLET | Freq: Every day | ORAL | Status: DC
  Administered 2021-03-25: 30 mg via ORAL
  Filled 2021-03-25: qty 1

## 2021-03-25 MED ORDER — ZOLPIDEM TARTRATE 5 MG PO TABS
10.0000 mg | ORAL_TABLET | Freq: Every day | ORAL | Status: DC
  Administered 2021-03-25: 10 mg via ORAL
  Filled 2021-03-25: qty 2

## 2021-03-25 MED ORDER — LORAZEPAM 0.5 MG PO TABS: 0.5000 mg | ORAL_TABLET | Freq: Two times a day (BID) | ORAL | Status: DC | PRN

## 2021-03-25 MED ORDER — ATORVASTATIN CALCIUM 10 MG PO TABS
20.0000 mg | ORAL_TABLET | Freq: Every day | ORAL | Status: DC
  Administered 2021-03-25 – 2021-03-26 (×2): 20 mg via ORAL
  Filled 2021-03-25 (×2): qty 2

## 2021-03-25 MED ORDER — GABAPENTIN 100 MG PO CAPS
100.0000 mg | ORAL_CAPSULE | Freq: Every day | ORAL | Status: DC | PRN
Start: 2021-03-25 — End: 2021-03-26
  Administered 2021-03-25: 200 mg via ORAL
  Filled 2021-03-25: qty 2

## 2021-03-25 MED ORDER — POTASSIUM CHLORIDE CRYS ER 20 MEQ PO TBCR
40.0000 meq | EXTENDED_RELEASE_TABLET | Freq: Once | ORAL | Status: AC
  Administered 2021-03-25: 40 meq via ORAL
  Filled 2021-03-25: qty 2

## 2021-03-25 NOTE — ED Triage Notes (Addendum)
Pt c/o weakness in her arms and legs, chest soreness, lower back pain, and hesitancy with urination x couple of days. Pt reports she was here recently with the same symptoms but her weakness has only gotten worse. Pt reports her HR drops down in the 40's and her SBP has been as high as 203 recently. She is supposed to have a doctor's appt on 03/31/21 about getting a pacemaker.   During triage, pt's HR fluctuates between 38-70bpm on the monitor.

## 2021-03-25 NOTE — ED Provider Notes (Signed)
[facility] Provider Note   CSN: [id number] Arrival date & time: 03/25/21  0820     History Chief Complaint  Patient presents with  . Weakness    Kayla Terry is a 75 y.o. female with a history including hypertension managed by diet, mitral valve regurgitation, Mobitz type II AV block, history of GERD and B12 deficiency presenting with progressively worsening generalized weakness.  She describes episodes of fatigue and heaviness in her extremities which has worsened since her last visit here last week where she was evaluated for bradycardia.  She is scheduled for an EP study on June 1 with Dr. Lovena Le for further evaluation of this condition.  She denies chest pain but does endorse a heavy sensation in her chest below her left breast which is constant and also describes a constant trembling sensation in her upper chest.  She denies shortness of breath, palpitations or dizziness.  She has had intermittent episodes of nausea, stating she felt nausea for most of yesterday.  Denies abdominal pain.  She also has a return of her suprapubic pressure in urinary frequency.  She had the symptoms earlier this month and was treated with 2 courses of antibiotics including Bactrim and Keflex, but her urinalysis was negative for infection.  Denies constipation.  No fevers or chills, no cough.  She has found no alleviators for her symptoms.  She takes her blood pressure at home and states the range has been anywhere from normal at 120/72 ranging to 203/120.  HPI     Past Medical History:  Diagnosis Date  . Allergy   . B12 deficiency 04/2020  . Cataract   . Fatty liver   . GERD (gastroesophageal reflux disease)   . Herpes zoster without complications   . Hiatal hernia   . Hypertension   . Mild mitral regurgitation 2013  . Mobitz type 2 second degree atrioventricular block   . Pectus excavatum    pectus excavatum deformity resulting in mass effect on the RV by CT 03/2020  .  Pulmonary nodule   . Shingles     Patient Active Problem List   Diagnosis Date Noted  . 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  . B12 deficiency 05/07/2020  . Muscle weakness 05/06/2020  . Benign essential hypertension 06/08/2017  . Acid reflux disease 03/08/2016  . Hyperlipidemia 03/08/2016  . Nocturia 03/08/2016  . Restless leg syndrome 03/08/2016  . Chest tightness 08/21/2012    Past Surgical History:  Procedure Laterality Date  . ABDOMINAL HYSTERECTOMY  02/1984  . BACK SURGERY    . CATARACT EXTRACTION, BILATERAL    . LUMBAR LAMINECTOMY    . OOPHORECTOMY  02/29/1984   unilateral     OB History   No obstetric history on file.     Family History  Problem Relation Age of Onset  . Cancer Mother   . Leukemia Mother   . Lung cancer Father   . Multiple myeloma Father   . Multiple sclerosis Sister   . Alcohol abuse Brother   . Multiple myeloma Brother   . Colon cancer Neg Hx   . Colon polyps Neg Hx   . Stomach cancer Neg Hx   . Esophageal cancer Neg Hx     Social History   Tobacco Use  . Smoking status: Never Smoker  . Smokeless tobacco: Never Used  Vaping Use  . Vaping Use: Never used  Substance Use Topics  . Alcohol use: No  . Drug use: No  Home Medications Prior to Admission medications   Medication Sig Start Date End Date Taking? Authorizing Provider  famotidine (PEPCID) 20 MG tablet Take 20 mg by mouth daily as needed for heartburn.    [provider]  gabapentin (NEURONTIN) 100 MG capsule Take 100-200 mg by mouth daily as needed (for restless legs).    [provider]  Lansoprazole (PREVACID PO) Take 1 tablet by mouth daily.    [provider]  LORazepam (ATIVAN) 0.5 MG tablet Take 0.5 mg by mouth 2 (two) times daily as needed for anxiety.     [provider]  Multiple Vitamin (MULTIVITAMIN) tablet Take 1 tablet by mouth daily.    [provider]  vitamin B-12 (CYANOCOBALAMIN) 500 MCG tablet  Take 500 mcg by mouth daily.    [provider]  zolpidem (AMBIEN) 10 MG tablet Take 10 mg by mouth at bedtime.    [provider]    Allergies    Escitalopram, Pantoprazole, and Naproxen sodium  Review of Systems   Review of Systems  Constitutional: Negative for chills and fever.  HENT: Negative for congestion and sore throat.   Eyes: Negative.   Respiratory: Negative for chest tightness and shortness of breath.   Cardiovascular: Negative for chest pain.  Gastrointestinal: Positive for nausea. Negative for abdominal pain and vomiting.  Genitourinary: Positive for frequency and urgency. Negative for dysuria.  Musculoskeletal: Positive for back pain. Negative for arthralgias, joint swelling and neck pain.  Skin: Negative.  Negative for rash and wound.  Neurological: Positive for weakness. Negative for dizziness, light-headedness, numbness and headaches.  Psychiatric/Behavioral: Negative.     Physical Exam Updated Vital Signs BP (!) 165/81 (BP Location: Left Arm)   Pulse (!) 55   Temp 98.2 F (36.8 C) (Oral)   Resp 18   Ht _0  (1.6 m)   Wt 56.7 kg   SpO2 98%   BMI 22.14 kg/m   Physical Exam Vitals and nursing note reviewed.  Constitutional:      Appearance: She is well-developed.  HENT:     Head: Normocephalic and atraumatic.     Mouth/Throat:     Mouth: Mucous membranes are moist.  Eyes:     Conjunctiva/sclera: Conjunctivae normal.  Cardiovascular:     Rate and Rhythm: Normal rate and regular rhythm.     Heart sounds: Normal heart sounds.     Comments: During initial evaluation patient's pulse rate dropped to 38-40 range 3 times, briefly then probably returns to 60-70 range. Pulmonary:     Effort: Pulmonary effort is normal. No respiratory distress.     Breath sounds: Normal breath sounds. No wheezing.  Abdominal:     General: Bowel sounds are normal.     Palpations: Abdomen is soft.     Tenderness: There is no abdominal tenderness. There is  no guarding or rebound.  Musculoskeletal:        General: Normal range of motion.     Cervical back: Normal range of motion.     Right lower leg: No edema.     Left lower leg: No edema.  Skin:    General: Skin is warm and dry.  Neurological:     Mental Status: She is alert.     ED Results / Procedures / Treatments   Labs (all labs ordered are listed, but only abnormal results are displayed) Labs Reviewed  COMPREHENSIVE METABOLIC PANEL - Abnormal; Notable for the following components:      Result Value  Potassium 3.4 (*)    Glucose, Bld 118 (*)    GFR, Estimated 59 (*)    All other components within normal limits  URINALYSIS, ROUTINE W REFLEX MICROSCOPIC - Abnormal; Notable for the following components:   Color, Urine STRAW (*)    Specific Gravity, Urine 1.003 (*)    All other components within normal limits  CBC  LIPASE, BLOOD  MAGNESIUM  TROPONIN I (HIGH SENSITIVITY)  TROPONIN I (HIGH SENSITIVITY)    EKG EKG Interpretation  Date/Time:  Thursday Mar 25 2021 08:37:37 EDT Ventricular Rate:  67 PR Interval:  166 QRS Duration: 137 QT Interval:  442 QTC Calculation: 467 R Axis:   -60 Text Interpretation: Sinus rhythm Left bundle branch block Non-specific ST-t changes Baseline wander No significant change since last tracing Confirmed by Lajean Saver [phone]) on 03/25/2021 8:47:37 AM   Radiology DG Chest Port 1 View  Result Date: 03/25/2021 CLINICAL DATA:  Weakness. EXAM: PORTABLE CHEST 1 VIEW COMPARISON:  CT 03/19/2021.  Chest x-ray 03/19/2021. FINDINGS: Mediastinum hilar structures normal. Cardiomegaly. No pulmonary venous congestion. Lungs are clear. No pleural effusion or pneumothorax. Degenerative change thoracic spine. IMPRESSION: 1.  Cardiomegaly.  No pulmonary venous congestion. 2.  No acute pulmonary disease. Electronically Signed   By: Marcello Moores  Register   On: 03/25/2021 09:12    Procedures Procedures   Medications Ordered in ED Medications - No data to  display  ED Course  I have reviewed the triage vital signs and the nursing notes.  Pertinent labs & imaging results that were available during my care of the patient were reviewed by me and considered in my medical decision making (see chart for details).    MDM Rules/Calculators/A&P                          Pt on monitor during this ed encounter with multiple transient episodes of bradycardia to 38.  Known Mobitz 2 heart block increasingly symptomatic.  Discussed with Dr. Timmothy Sours with cardiology who recommends hospitalist service/transfer to [facility] for inpt  EP evaluation.  Discussed with Dr. Dyann Kief who states pt better candidate with cardiology service given Hospitalists will not have much else to offer pt. Cardiology agrees and will place their service at [facility].  Final Clinical Impression(s) / ED Diagnoses Final diagnoses:  Symptomatic bradycardia    Rx / DC Orders ED Discharge Orders    None       Landis Martins 03/25/21 1109    Lajean Saver, MD 03/25/21 1255

## 2021-03-25 NOTE — Consult Note (Addendum)
Cardiology Consultation:   Patient ID: Nikeya Terry MRN: [id number]; DOB: 01/16/1946  Admit date: 03/25/2021 Date of Consult: 03/25/2021  PCP:  Christain Sacramento, MD   [facility] Providers Cardiologist:  Quay Burow, MD   {   Patient Profile:   Kayla Terry is a 75 y.o. female with a hx of pectus excavatum deformity ?resulting in mass effect on the right ventricle by CT 03/2020, HTN, acid reflux, hiatal hernia, B12 deficiency, recent Mobitz 2 AVB and SVT, pulmonary nodule by CT 03/2020, recent 20lb who is being seen 03/25/2021 for the evaluation of 2:1 AVblock at the request of Dr. Timmothy Sours  History of Present Illness:   Ms. Muro has had a number of office visits and ER visit with a constellation of symptoms. CP dates back a year at least with a neg Myoview in in June 2021, and suspect perhaps to be GI with findings of a hiatal hernia.Barium study showed slight esophageal dysmotility. She underwent EGD 04/2020 showing hiatal hernia but otherwise no obvious cause for symptoms. Abd [place] 05/2020 with possible fatty liver,  CT chest was done as well without aortic aneurysm/dissection with a component of back pain, that r/o this, did find pectus excavatum, deformity resulting in mass effect on the RV, 46mm LLL nodule (repeat CT 12 months if desired), aortic atherosclerosis. She also has reported 20lb weight loss and significant cold intolerance over the prior year as well. TSH was normal  She c/o nausea, generalized fatigue and low back pain.  Mentions as well b/l UE and LE weakness. She has seen neurology with note of chronic pain, headaches, dizziness, primary c/o most recently was of her legs and arms stay weak, she can function but they fall asleep and her lips tingle. She was previouslydiagnosed her with cervical stenosis and myelopathy close to a year ago, she saw Dr. Vertell Limber but declined surgery.  She mentioned along the way at out patient visits observations at home HRs 90s that drop  suddenly and c/o palpitationswas placed on a monitor outpatient.  This noted intermittent Mobitz II, 2:1 block and was planned for EP consult out patient with Dr. Lovena Le, this is scheduled for 03/31/21   She went to [facility] 03/19/21, c/o CP,  back pain , belly discomfort.  CP felt chronic in nature, with this dating back years in her record, planned to pursue the CT chest as discussed above and update her echo.  No clear symptoms or hemodynamic instability with her AVblock and felt outpt w/u was ok. Echo noted preserved LVEF 60-65%, grade I DD, normal RV size and function, no significant VHD, CT r/o aneurysm/dissection and she was discharged from the ER.  TODAY she returned to the ER with a persistent "jittery" feeling in her chest, ongoing generalized fatigue, no energy, particularly her arms/legs.  They are not numb, tingling, and no focal abnormalities or weaknesses, "I feel like a dish rag". She was again noted with intermittent 2:1 AVblock and while not felt to be the cause of all of her symptoms, suspect her fatigue related to the bradycardia and mentions that observed AV block on the monitor seemed to correlate to the jittery feeling/nausea and recommended transfer for EP evaluation   HERE as we enter the room she is conduction 1:1 70's-80 range and mentions she was feeling pretty rough with ongoing nausea. Her primary complaint to [place] was low back pain for about a week PMD suspected a UTI and she has had 2 rounds of antibiotics for this. She mentions  that she persistently feels poorly, the wet rag, limb weakness perhaps wakes and wanes, but not abruptly in any way.  She asks for her back pain to be looked into while here and mentions that she was told at [facility] that a PPM may not make her feel better, and if that is the case, wonders if she should get one or not. I asked a number of times if she has ever fainted or felt faint and she denies this. She would be agreeable if felt to be of benefit. She also  mentions that yesterday while seated noted a painful area on her R buttock with a knot and asks it be looked at  There is mentions of suspect depression as a component to her symotoms  LABS K+ 3.4 BUN/Creat 10/0.99 Mag 2.1 WBC 5.7 H/H 13/40 Plts 253  Med list reviewed noting no nodal blocking agents   Past Medical History:  Diagnosis Date  . Allergy   . B12 deficiency 04/2020  . Cataract   . Fatty liver   . GERD (gastroesophageal reflux disease)   . Herpes zoster without complications   . Hiatal hernia   . Hypertension   . Mild mitral regurgitation 2013  . Mobitz type 2 second degree atrioventricular block   . Pectus excavatum    pectus excavatum deformity resulting in mass effect on the RV by CT 03/2020  . Pulmonary nodule   . Shingles     Past Surgical History:  Procedure Laterality Date  . ABDOMINAL HYSTERECTOMY  02/1984  . BACK SURGERY    . CATARACT EXTRACTION, BILATERAL    . LUMBAR LAMINECTOMY    . OOPHORECTOMY  02/29/1984   unilateral     Home Medications:  Prior to Admission medications   Medication Sig Start Date End Date Taking? Authorizing Provider  famotidine (PEPCID) 20 MG tablet Take 20 mg by mouth daily as needed for heartburn.   Yes [provider]  gabapentin (NEURONTIN) 100 MG capsule Take 100-200 mg by mouth daily as needed (for restless legs).   Yes [provider]  LORazepam (ATIVAN) 0.5 MG tablet Take 0.5 mg by mouth 2 (two) times daily as needed for anxiety.    Yes [provider]  Multiple Vitamin (MULTIVITAMIN) tablet Take 1 tablet by mouth daily.   Yes [provider]  vitamin B-12 (CYANOCOBALAMIN) 500 MCG tablet Take 500 mcg by mouth daily.   Yes [provider]  zolpidem (AMBIEN) 10 MG tablet Take 10 mg by mouth at bedtime.   Yes [provider]    Inpatient Medications: Scheduled Meds: . atorvastatin  20 mg Oral Daily  . enoxaparin (LOVENOX) injection  40 mg Subcutaneous Q24H  .  NIFEdipine  30 mg Oral Daily  . zolpidem  10 mg Oral QHS   Continuous Infusions:  PRN Meds: famotidine, LORazepam  Allergies:    Allergies  Allergen Reactions  . Escitalopram Other (See Comments)    Nausea and dizziness  . Pantoprazole Other (See Comments)    headaches  . Naproxen Sodium Anxiety    Social History:   Social History   Socioeconomic History  . Marital status: Widowed    Spouse name: Not on file  . Number of children: Not on file  . Years of education: Not on file  . Highest education level: Not on file  Occupational History  . Not on file  Tobacco Use  . Smoking status: Never Smoker  . Smokeless tobacco: Never Used  Vaping Use  .  Vaping Use: Never used  Substance and Sexual Activity  . Alcohol use: No  . Drug use: No  . Sexual activity: Not on file  Other Topics Concern  . Not on file  Social History Narrative   Lives alone    Her husband passed Feb 2021   Caffeine: maybe 1 cup/day, mostly water    Social Determinants of Health   Financial Resource Strain: Not on file  Food Insecurity: Not on file  Transportation Needs: Not on file  Physical Activity: Not on file  Stress: Not on file  Social Connections: Not on file  Intimate Partner Violence: Not on file    Family History:   Family History  Problem Relation Age of Onset  . Cancer Mother   . Leukemia Mother   . Lung cancer Father   . Multiple myeloma Father   . Multiple sclerosis Sister   . Alcohol abuse Brother   . Multiple myeloma Brother   . Colon cancer Neg Hx   . Colon polyps Neg Hx   . Stomach cancer Neg Hx   . Esophageal cancer Neg Hx      ROS:  Please see the history of present illness.  All other ROS reviewed and negative.     Physical Exam/Data:   Vitals:   03/25/21 1400 03/25/21 1450 03/25/21 1626 03/25/21 1635  BP: (!) 174/95 (!) 169/92 (!) 176/96   Pulse: 66 69 87   Resp: _0 Temp:  98.1 F (36.7 C) 98.2 F (36.8 C)   TempSrc:   Oral   SpO2: 98%  98%  98%  Weight:      Height:       No intake or output data in the 24 hours ending 03/25/21 1707 Last 3 Weights 03/25/2021 03/19/2021 03/14/2021  Weight (lbs) 125 lb 125 lb 125 lb  Weight (kg) 56.7 kg 56.7 kg 56.7 kg     Body mass index is 22.14 kg/m.  General:  Well nourished, well developed, in no acute distress HEENT: normal Lymph: no adenopathy Neck: no JVD Endocrine:  No thryomegaly Vascular: No carotid bruits Cardiac:  RRR; no murmurs, gallops or rubs Lungs:  CTA b/l, no wheezing, rhonchi or rales  Abd: soft, nontender  Ext: no edema Musculoskeletal:  No deformities, age appropriate atrophy Skin: warm and dry  Neuro:  No gross focal abnormalities noted Psych:  Normal affect   EKG:  The EKG was personally reviewed and demonstrates:   SR, 67bpm, LBBB, LAD 2:1 AVblock, V rate 39  03/19/21: SR 72 > 2:1 AVBlock, Mobitz II  Telemetry:  Telemetry was personally reviewed and demonstrates:   SR with intermittent 2:1 AVblock, rates 40's   Relevant CV Studies:  CT chest 03/19/21: IMPRESSION: 1. No evidence of acute aortic dissection, aneurysm or other acute vascular pathology. 2. Scattered atherosclerotic vascular calcifications and plaque. 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 This is highly unlikely to be the source of any clinical symptoms. Aortic Atherosclerosis (ICD10-I70.0). 3. No acute abnormality within the abdomen or pelvis. 4. Pectus excavatum noted incidentally.    03/19/21: TTE IMPRESSIONS  1. Left ventricular ejection fraction, by estimation, is 60 to 65%. The  left ventricle has normal function. The left ventricle has no regional  wall motion abnormalities. There is mild left ventricular hypertrophy.  Left ventricular diastolic parameters  are consistent with Grade I diastolic dysfunction (impaired relaxation).  2. Right ventricular systolic function is normal. The  right  ventricular  size is normal.  3. The mitral valve is normal in structure. No evidence of mitral valve  regurgitation. No evidence of mitral stenosis.  4. The aortic valve was not well visualized. There is mild calcification  of the aortic valve. There is mild thickening of the aortic valve. Aortic  valve regurgitation is not visualized. No aortic stenosis is present.    Event monitor 3-01/2021 Patch Wear Time: 13 days and 17 hours (2022-03-18T15:44:35-0400 to 2022-04-01T08:57:03-399)  Patient had a min HR of 32 bpm, max HR of 162 bpm, and avg HR of 67 bpm. Predominant underlying rhythm was Sinus Rhythm. First Degree AV Block was present. Bundle Branch Block/IVCD was present. QRS morphology changes were present throughout recording.  Slight P wave morphology changes were noted. 1 run of Supraventricular Tachycardia occurred lasting 5 beats with a max rate of 133 bpm (avg 111 bpm). 9605 episode(s) of AV Block (2nd Mobitz II) occurred, lasting a total of 1 day 6 hours. AV Block  (2nd Mobitz II) was detected within +/- 45 seconds of symptomatic patient event(s). Isolated SVEs were rare (<1.0%), SVE Couplets were rare (<1.0%), and no SVE Triplets were present. Isolated VEs were rare (<1.0%), and no VE Couplets or VE Triplets  were present. MD notification criteria for Symptomatic Second Degree AV Block, Mobitz II met - report posted prior to notification per account request (DI).   1. SR/SB/ST 2. Mobitz Type 2 second degeee AVB 3. Short runs of SVT   NST 03/2020  The left ventricular ejection fraction is normal (55-65%).  Nuclear stress EF: 60%.  There was no ST segment deviation noted during stress.  There is a small defect of mild severity present in the basal inferoseptal, mid anteroseptal and mid inferoseptal location. The defect is non-reversible and worse on rest imaging. This is consistent with breast attenuation artifact. No ischemia.  This is a low risk  study.   Laboratory Data:  High Sensitivity Troponin:   Recent Labs  Lab 03/19/21 0755 03/19/21 1010 03/25/21 0904 03/25/21 1154  TROPONINIHS _0 Chemistry Recent Labs  Lab 03/19/21 0755 03/25/21 0904  NA 137 139  K 3.5 3.4*  CL 101 105  CO2 25 25  GLUCOSE 116* 118*  BUN 8 10  CREATININE 0.98 0.99  CALCIUM 9.6 9.2  GFRNONAA >60 59*  ANIONGAP 11 9    Recent Labs  Lab 03/19/21 0755 03/25/21 0904  PROT 7.4 6.6  ALBUMIN 4.5 3.9  AST 16 19  ALT 14 14  ALKPHOS 96 90  BILITOT 0.8 0.4   Hematology Recent Labs  Lab 03/19/21 0755 03/25/21 0904  WBC 7.3 5.7  RBC 4.91 4.43  HGB 14.8 13.3  HCT 44.5 40.4  MCV 90.6 91.2  MCH 30.1 30.0  MCHC 33.3 32.9  RDW 13.2 13.3  PLT 331 253   BNPNo results for input(s): BNP, PROBNP in the last 168 hours.  DDimer No results for input(s): DDIMER in the last 168 hours.   Radiology/Studies:  DG Chest Port 1 View  Result Date: 03/25/2021 CLINICAL DATA:  Weakness. EXAM: PORTABLE CHEST 1 VIEW COMPARISON:  CT 03/19/2021.  Chest x-ray 03/19/2021. FINDINGS: Mediastinum hilar structures normal. Cardiomegaly. No pulmonary venous congestion. Lungs are clear. No pleural effusion or pneumothorax. Degenerative change thoracic spine. IMPRESSION: 1.  Cardiomegaly.  No pulmonary venous congestion. 2.  No acute pulmonary disease. Electronically Signed   By: Marcello Moores  Register   On: 03/25/2021 09:12  Assessment and Plan:   1. Intermittent 2:1 AVblock     Hard to connect her symptoms reliably at least to her heart block  Dr. Curt Bears discussed as did I, that certainly does not explain the majority of her symptoms, though if bradycardia is causing some of her fatigue, generalized weakness, a pacer make may be of benefit but could not guarantee that she would feel clearaly any better.  Recommend that we watch her over night and revisit with her tomorrow with EP MD in the morning and discuss further. She is not inclined to have PPM  if not going to feel better.   2. HTN     I don't see that she was on a BP controlling medicine at home     She was started on Procardia at [facility], not likely to provoke worsening of her AV block, though Carrina Schoenberger switch to a PRN hydralazine for now   3. Back pain     Locates very low back near coccyx     She has a surgical scar where she locates the pain from a very remote back surgery for a ruptured disck     Recommend out patient f/u for this  4. She asks to look at the painfull area R buttock     I do not appreciate any skin lesions, or palpable masses     Also recommended to f/u out patient   5. Nausea     Not new, prior GI w/u     No vomiting, reports her usual intake     Continue out patient eval     She has been recommended to f/u with her PMD by other providers as well for reports of unintentional weight loss for cancer screening  6. Limb weakness     This is b/l UE/LE     No focal defitics     Seen by neurology a few weeks ago and recommended to f/u cervical stenosis and myelopathy diagnosed a year ago                  Risk Assessment/Risk Scores:     For questions or updates, please contact [place] Please consult [email or web address] for contact info under    Signed, Baldwin Jamaica, PA-C  03/25/2021 5:07 PM   I have seen and examined this patient with Tommye Standard.  Agree with above, note added to reflect my findings.  On exam, RRR, no murmurs, lungs clear.  Patient presented to hospital with a constellation of symptoms, most notably bilateral upper extremity fatigue and back pain.  Her back pain has been an ongoing issue.  She also has nausea with a prolonged GI work-up.  She was noted to be in 2-1 AV block intermittently.  It was thought initially that her 2-1 AV block could be the cause of some of her fatigue.  In discussing with the patient, she does continue to complain of fatigue, though on telemetry at the time of her complaints, her heart rate was in the 70s.   It is certainly possible that her fatigue is somewhat exacerbated by her 2-1 AV block.  We Alva Kuenzel continue to monitor overnight tonight to determine whether or not pacemaker implant would be reasonable.  Gedalya Jim M. Roxane Puerto MD 03/25/2021 6:28 PM

## 2021-03-25 NOTE — H&P (Signed)
Cardiology Admission History and Physical:   Patient ID: Kayla Terry MRN: [id number]; DOB: [birth year]   Admission date: 03/25/2021  PCP:  Kayla Sacramento, MD   [facility] Providers Cardiologist:  Quay Burow, MD        Chief Complaint: Fatigue  Patient Profile:   Kayla Terry is a 75 y.o. female with history of hypertension and second-degree type II AV block who is being seen 03/25/2021 for the evaluation of progressive fatigue concerning for symptomatic bradycardia.  History of Present Illness:   Kayla Terry a very pleasant 75 year old female with a history of hypertension and second-degree type II AV block who presented to the emergency room for evaluation of various symptoms.  Reports that for a "long while" she has been feeling very weak and fatigued.  She reports that she goes to bed around 8 PM and gets restful sleep.  She wakes up around 7 AM and reports that she wakes up feeling rested.  She does not feel tired but she feels weak and describes the sensation as a "dishrag ".  She describes the sensation as global but predominantly in her arms and legs.  The sensation is present whether she is sitting down or ambulating.  Does not appear to be worse with ambulation.  She denies specifically experiencing lightheadedness and dizziness and a sensation of presyncope, but reports that when she "feels that way "she holds on to the wall and the sensation goes away.  She reports this happens very "seldom ".  She also reports a "jittery" feeling in her chest that has not been bothering her very much today but was bothering her a lot yesterday.  She equates the sensation to nausea that is ever present.  She reports that for a prolonged period of time she has been experiencing back pain as well as pain in her chest that is going all the way through to her back.  She also reports a soreness under her left breast as well as in her upper chest.  She previously took Prevacid and Pepcid for  the symptoms with mild relief.  Today she also reports lower abdominal discomfort and a sensation of urinary urgency.  She has been monitoring her blood pressure at home and reports that her blood pressure readings are very labile and can be anywhere from 383 systolic to 338 systolic.  Reports that her appetite has been normal and she has been eating normally, but has lost 20 pounds in the last year unintentionally.  She reports that she has had all her routine age-appropriate cancer screening very recently and everything was within normal limits.  She has also been experiencing cold intolerance but has not had any fevers or shaking chills.   Past Medical History:  Diagnosis Date  . Allergy   . B12 deficiency 04/2020  . Cataract   . Fatty liver   . GERD (gastroesophageal reflux disease)   . Herpes zoster without complications   . Hiatal hernia   . Hypertension   . Mild mitral regurgitation 2013  . Mobitz type 2 second degree atrioventricular block   . Pectus excavatum    pectus excavatum deformity resulting in mass effect on the RV by CT 03/2020  . Pulmonary nodule   . Shingles     Past Surgical History:  Procedure Laterality Date  . ABDOMINAL HYSTERECTOMY  02/1984  . BACK SURGERY    . CATARACT EXTRACTION, BILATERAL    . LUMBAR LAMINECTOMY    . OOPHORECTOMY  02/29/1984  unilateral     Medications Prior to Admission: Prior to Admission medications   Medication Sig Start Date End Date Taking? Authorizing Provider  famotidine (PEPCID) 20 MG tablet Take 20 mg by mouth daily as needed for heartburn.   Yes [provider]  gabapentin (NEURONTIN) 100 MG capsule Take 100-200 mg by mouth daily as needed (for restless legs).   Yes [provider]  LORazepam (ATIVAN) 0.5 MG tablet Take 0.5 mg by mouth 2 (two) times daily as needed for anxiety.    Yes [provider]  Multiple Vitamin (MULTIVITAMIN) tablet Take 1 tablet by mouth daily.   Yes [provider]  vitamin B-12 (CYANOCOBALAMIN) 500 MCG tablet Take 500 mcg by mouth daily.   Yes [provider]  zolpidem (AMBIEN) 10 MG tablet Take 10 mg by mouth at bedtime.   Yes [provider]     Allergies:    Allergies  Allergen Reactions  . Escitalopram Other (See Comments)    Nausea and dizziness  . Pantoprazole Other (See Comments)    headaches  . Naproxen Sodium Anxiety    Social History:   Social History   Socioeconomic History  . Marital status: Widowed    Spouse name: Not on file  . Number of children: Not on file  . Years of education: Not on file  . Highest education level: Not on file  Occupational History  . Not on file  Tobacco Use  . Smoking status: Never Smoker  . Smokeless tobacco: Never Used  Vaping Use  . Vaping Use: Never used  Substance and Sexual Activity  . Alcohol use: No  . Drug use: No  . Sexual activity: Not on file  Other Topics Concern  . Not on file  Social History Narrative   Lives alone    Her husband passed Feb 2021   Caffeine: maybe 1 cup/day, mostly water    Social Determinants of Health   Financial Resource Strain: Not on file  Food Insecurity: Not on file  Transportation Needs: Not on file  Physical Activity: Not on file  Stress: Not on file  Social Connections: Not on file  Intimate Partner Violence: Not on file    Family History:   The patient's family history includes Alcohol abuse in her brother; Cancer in her mother; Leukemia in her mother; Lung cancer in her father; Multiple myeloma in her brother and father; Multiple sclerosis in her sister. There is no history of Colon cancer, Colon polyps, Stomach cancer, or Esophageal cancer.    ROS:  Please see the history of present illness.  All other ROS reviewed and negative.     Physical Exam/Data:   Vitals:   03/25/21 1015 03/25/21 1030 03/25/21 1045 03/25/21 1100  BP:  (!) 172/94  (!) 174/95  Pulse: 70 64 66 65  Resp: _0 Temp:       TempSrc:      SpO2: 97% 98% 97% 97%  Weight:      Height:       No intake or output data in the 24 hours ending 03/25/21 1158 Last 3 Weights 03/25/2021 03/19/2021 03/14/2021  Weight (lbs) 125 lb 125 lb 125 lb  Weight (kg) 56.7 kg 56.7 kg 56.7 kg     Body mass index is 22.14 kg/m.  General:  Well nourished, well developed, in no acute distress HEENT: normal Lymph: no adenopathy Neck: no JVD Endocrine:  No thryomegaly Vascular: No carotid bruits; FA pulses  2+ bilaterally without bruits  Cardiac:  normal S1, S2; RRR; no murmur  Lungs:  clear to auscultation bilaterally, no wheezing, rhonchi or rales  Abd: soft, nontender, no hepatomegaly  Ext: no edema Musculoskeletal:  No deformities, BUE and BLE strength normal and equal Skin: warm and dry  Neuro:  CNs 2-12 intact, no focal abnormalities noted Psych:  Normal affect    EKG:  The ECG that was donewas personally reviewed and demonstrates sinus rhythm sinus bradycardia with left bundle branch block.  Subsequent EKG with sinus rhythm 2-1 AV block and left bundle branch block   Telemetry: Telemetry personally reviewed and demonstrates sinus rhythm sinus bradycardia with second-degree type II AV block and heart rates down to 30s  Relevant CV Studies: Event monitor 3-01/2021 Patch Wear Time: 13 days and 17 hours (2022-03-18T15:44:35-0400 to 2022-04-01T08:57:03-399)  Patient had a min HR of 32 bpm, max HR of 162 bpm, and avg HR of 67 bpm. Predominant underlying rhythm was Sinus Rhythm. First Degree AV Block was present. Bundle Branch Block/IVCD was present. QRS morphology changes were present throughout recording.  Slight P wave morphology changes were noted. 1 run of Supraventricular Tachycardia occurred lasting 5 beats with a max rate of 133 bpm (avg 111 bpm). 9605 episode(s) of AV Block (2nd Mobitz II) occurred, lasting a total of 1 day 6 hours. AV Block  (2nd Mobitz II) was detected within +/- 45 seconds of symptomatic patient  event(s). Isolated SVEs were rare (<1.0%), SVE Couplets were rare (<1.0%), and no SVE Triplets were present. Isolated VEs were rare (<1.0%), and no VE Couplets or VE Triplets  were present. MD notification criteria for Symptomatic Second Degree AV Block, Mobitz II met - report posted prior to notification per account request (DI).   1. SR/SB/ST 2. Mobitz Type 2 second degeee AVB 3. Short runs of SVT   NST 03/2020  The left ventricular ejection fraction is normal (55-65%).  Nuclear stress EF: 60%.  There was no ST segment deviation noted during stress.  There is a small defect of mild severity present in the basal inferoseptal, mid anteroseptal and mid inferoseptal location. The defect is non-reversible and worse on rest imaging. This is consistent with breast attenuation artifact. No ischemia.  This is a low risk study.    TTE 03/19/2021 1. Left ventricular ejection fraction, by estimation, is 60 to 65%. The  left ventricle has normal function. The left ventricle has no regional  wall motion abnormalities. There is mild left ventricular hypertrophy.  Left ventricular diastolic parameters  are consistent with Grade I diastolic dysfunction (impaired relaxation).  2. Right ventricular systolic function is normal. The right ventricular  size is normal.  3. The mitral valve is normal in structure. No evidence of mitral valve  regurgitation. No evidence of mitral stenosis.  4. The aortic valve was not well visualized. There is mild calcification  of the aortic valve. There is mild thickening of the aortic valve. Aortic  valve regurgitation is not visualized. No aortic stenosis is present.   2D echo 2013 - Left ventricle: The cavity size was normal. Wall thickness  was normal. Systolic function was normal. The estimated  ejection fraction was in the range of 55% to 60%. Wall  motion was normal; there were no regional wall motion  abnormalities. Features are  consistent with a pseudonormal  left ventricular filling pattern, with concomitant  abnormal relaxation and increased filling pressure (grade  2 diastolic dysfunction).  - Mitral valve: Mild regurgitation.  -  Atrial septum: No defect or patent foramen ovale was  identified.   Laboratory Data:  High Sensitivity Troponin:   Recent Labs  Lab 03/19/21 0755 03/19/21 1010 03/25/21 0904  TROPONINIHS _0 Chemistry Recent Labs  Lab 03/19/21 0755 03/25/21 0904  NA 137 139  K 3.5 3.4*  CL 101 105  CO2 25 25  GLUCOSE 116* 118*  BUN 8 10  CREATININE 0.98 0.99  CALCIUM 9.6 9.2  GFRNONAA >60 59*  ANIONGAP 11 9    Recent Labs  Lab 03/19/21 0755 03/25/21 0904  PROT 7.4 6.6  ALBUMIN 4.5 3.9  AST 16 19  ALT 14 14  ALKPHOS 96 90  BILITOT 0.8 0.4   Hematology Recent Labs  Lab 03/19/21 0755 03/25/21 0904  WBC 7.3 5.7  RBC 4.91 4.43  HGB 14.8 13.3  HCT 44.5 40.4  MCV 90.6 91.2  MCH 30.1 30.0  MCHC 33.3 32.9  RDW 13.2 13.3  PLT 331 253   BNPNo results for input(s): BNP, PROBNP in the last 168 hours.  DDimer No results for input(s): DDIMER in the last 168 hours.   Radiology/Studies:  DG Chest Port 1 View  Result Date: 03/25/2021 CLINICAL DATA:  Weakness. EXAM: PORTABLE CHEST 1 VIEW COMPARISON:  CT 03/19/2021.  Chest x-ray 03/19/2021. FINDINGS: Mediastinum hilar structures normal. Cardiomegaly. No pulmonary venous congestion. Lungs are clear. No pleural effusion or pneumothorax. Degenerative change thoracic spine. IMPRESSION: 1.  Cardiomegaly.  No pulmonary venous congestion. 2.  No acute pulmonary disease. Electronically Signed   By: Marcello Moores  Register   On: 03/25/2021 09:12     Assessment and Plan:   1. Second-degree AV block, type II, symptomatic  patient with history of documented symptomatic second-degree AV block, type II, who is presenting for evaluation of several complaints one of which is fatigue and weakness.  She also reports a vague feeling  that she calls "jittery" and "nauseous"  in her chest appears to be what correlates to her AV block.  She has already had a Holter which demonstrated symptomatic second-degree AV block.  Today in the emergency room she has secondary type II AV block with bradycardia down to the 30s.  Suspect that her fatigue is related to her bradycardia and second-degree AV block.  Discussed this in detail with the patient and discussed the management options.  Patient offered a pacemaker which might improve her symptoms.  Patient educated extensively on the risk of the pacemaker not leading to resolution of her other vague complaints.  Discussed patient with EP and will plan to transfer to [facility] for EP evaluation and consideration of permanent pacemaker placement.  2. Hypertension  patient reported labile hypertension at home currently hypertensive to the 518A systolic.  We will plan to initiate nifedipine 30 mg daily.  3. Left bundle branch block  patient with left bundle branch block which has been stable.  4. Aortic atherosclerosis  patient with aortic atherosclerosis noted on CT scan.  Will initiate atorvastatin.  5. Pain syndrome  patient with a pain syndrome to include upper and lower back pain as well as suprapubic pain/tenderness.  Patient has had extensive evaluation that has not revealed an etiology.  Suspect that the pain syndrome and unintentional weight loss are both related to similar underlying etiology, possibly depression.  Recommend further evaluation with primary care and behavioral health once heart block has been addressed.  6. Weight loss, unintentional  patient reports normal appetite and oral intake but  unintentional weight loss of 20 pounds over the past year.  She has had age-appropriate cancer screening which has been negative.  Thyroid function has also been normal.  Do not anticipate this to be in any way cardiac.  Suspect component of depression which should be evaluated and  follow-up with primary care.   Risk Assessment/Risk Scores:            Severity of Illness: The appropriate patient status for this patient is INPATIENT. Inpatient status is judged to be reasonable and necessary in order to provide the required intensity of service to ensure the patient's safety. The patient's presenting symptoms, physical exam findings, and initial radiographic and laboratory data in the context of their chronic comorbidities is felt to place them at high risk for further clinical deterioration. Furthermore, it is not anticipated that the patient will be medically stable for discharge from the hospital within 2 midnights of admission. The following factors support the patient status of inpatient.   " The patient's presenting symptoms include symptomatic bradycardia requiring pacemaker. " The worrisome physical exam findings include bradycardia. " The initial radiographic and laboratory data are worrisome because of EKG evidence of significant bradycardia and second-degree type II AV block. " The chronic co-morbidities include hypertension and second-degree type II AV block, symptomatic.   * I certify that at the point of admission it is my clinical judgment that the patient will require inpatient hospital care spanning beyond 2 midnights from the point of admission due to high intensity of service, high risk for further deterioration and high frequency of surveillance required.*    For questions or updates, please contact [place] Please consult [email or web address] for contact info under   Chart review, face-to-face time, and documentation 90 minutes.  50 minutes spent providing patient education and coordinating care. SignedMemory Dance, MD  03/25/2021 11:58 AM

## 2021-03-26 DIAGNOSIS — I441 Atrioventricular block, second degree: Secondary | ICD-10-CM | POA: Diagnosis not present

## 2021-03-26 LAB — BASIC METABOLIC PANEL
Anion gap: 8 (ref 5–15)
BUN: 10 mg/dL (ref 8–23)
CO2: 28 mmol/L (ref 22–32)
Calcium: 9.4 mg/dL (ref 8.9–10.3)
Chloride: 103 mmol/L (ref 98–111)
Creatinine, Ser: 0.89 mg/dL (ref 0.44–1.00)
GFR, Estimated: >60 mL/min (ref >60)
Glucose, Bld: 100 mg/dL — ABNORMAL HIGH (ref 70–99)
Potassium: 4.1 mmol/L (ref 3.5–5.1)
Sodium: 139 mmol/L (ref 135–145)

## 2021-03-26 LAB — CBC
HCT: 40.1 % (ref 36.0–46.0)
Hemoglobin: 13.4 g/dL (ref 12.0–15.0)
MCH: 29.7 pg (ref 26.0–34.0)
MCHC: 33.4 g/dL (ref 30.0–36.0)
MCV: 88.9 fL (ref 80.0–100.0)
Platelets: 253 10*3/uL (ref 150–400)
RBC: 4.51 MIL/uL (ref 3.87–5.11)
RDW: 13.2 % (ref 11.5–15.5)
WBC: 5.7 10*3/uL (ref 4.0–10.5)
nRBC: 0 % (ref 0.0–0.2)

## 2021-03-26 MED ORDER — ALUM & MAG HYDROXIDE-SIMETH 200-200-20 MG/5ML PO SUSP
30.0000 mL | ORAL | Status: DC | PRN
  Filled 2021-03-26: qty 30

## 2021-03-26 NOTE — Progress Notes (Addendum)
Progress Note  Patient Name: Kayla Terry Date of Encounter: 03/26/2021  [facility] Cardiologist: Quay Burow, MD   Subjective   Slept well, no symptoms overnight, feels well this morning, hungry.  Inpatient Medications    Scheduled Meds: . atorvastatin  20 mg Oral Daily  . zolpidem  10 mg Oral QHS   Continuous Infusions:  PRN Meds: alum & mag hydroxide-simeth, famotidine, gabapentin, hydrALAZINE, LORazepam   Vital Signs    Vitals:   03/25/21 1850 03/25/21 2128 03/26/21 0032 03/26/21 0615  BP: (!) 165/91 (!) 132/92 120/84 121/83  Pulse: 67 85 85 81  Resp:  '16 18 15  ' Temp:  (!) 97.3 F (36.3 C) (!) 97.2 F (36.2 C) 98 F (36.7 C)  TempSrc:  Oral Oral Oral  SpO2: 98% 97% 98% 99%  Weight:      Height:       No intake or output data in the 24 hours ending 03/26/21 0839 Last 3 Weights 03/25/2021 03/19/2021 03/14/2021  Weight (lbs) 125 lb 125 lb 125 lb  Weight (kg) 56.7 kg 56.7 kg 56.7 kg      Telemetry    SR 60's-80's, brief infrequent 2:1 AVblock without prolonged bradycardia - Personally Reviewed  ECG    No new EKGs - Personally Reviewed  Physical Exam   GEN: No acute distress.   Neck: No JVD Cardiac: RRR, no murmurs, rubs, or gallops.  Respiratory: CTA b/l. GI: Soft, nontender, non-distended  MS: No edema; No deformity. Neuro:  Nonfocal  Psych: Normal affect   Labs    High Sensitivity Troponin:   Recent Labs  Lab 03/19/21 0755 03/19/21 1010 03/25/21 0904 03/25/21 1154  TROPONINIHS '3 3 3 4      ' Chemistry Recent Labs  Lab 03/25/21 0904 03/26/21 0240  NA 139 139  K 3.4* 4.1  CL 105 103  CO2 25 28  GLUCOSE 118* 100*  BUN 10 10  CREATININE 0.99 0.89  CALCIUM 9.2 9.4  PROT 6.6  --   ALBUMIN 3.9  --   AST 19  --   ALT 14  --   ALKPHOS 90  --   BILITOT 0.4  --   GFRNONAA 59* >60  ANIONGAP 9 8     Hematology Recent Labs  Lab 03/25/21 0904 03/26/21 0240  WBC 5.7 5.7  RBC 4.43 4.51  HGB 13.3 13.4  HCT 40.4 40.1   MCV 91.2 88.9  MCH 30.0 29.7  MCHC 32.9 33.4  RDW 13.3 13.2  PLT 253 253    BNPNo results for input(s): BNP, PROBNP in the last 168 hours.   DDimer No results for input(s): DDIMER in the last 168 hours.   Radiology    DG Chest Port 1 View Result Date: 03/25/2021 CLINICAL DATA:  Weakness. EXAM: PORTABLE CHEST 1 VIEW COMPARISON:  CT 03/19/2021.  Chest x-ray 03/19/2021. FINDINGS: Mediastinum hilar structures normal. Cardiomegaly. No pulmonary venous congestion. Lungs are clear. No pleural effusion or pneumothorax. Degenerative change thoracic spine. IMPRESSION: 1.  Cardiomegaly.  No pulmonary venous congestion. 2.  No acute pulmonary disease. Electronically Signed   By: Marcello Moores  Register   On: 03/25/2021 09:12    Cardiac Studies   CT chest 03/19/21: IMPRESSION: 1. No evidence of acute aortic dissection, aneurysm or other acute vascular pathology. 2. Scattered atherosclerotic vascular calcifications and plaque. 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 This is highly unlikely to be the source of any clinical symptoms. Aortic Atherosclerosis (ICD10-I70.0).  3. No acute abnormality within the abdomen or pelvis. 4. Pectus excavatum noted incidentally.    03/19/21: TTE IMPRESSIONS  1. Left ventricular ejection fraction, by estimation, is 60 to 65%. The  left ventricle has normal function. The left ventricle has no regional  wall motion abnormalities. There is mild left ventricular hypertrophy.  Left ventricular diastolic parameters  are consistent with Grade I diastolic dysfunction (impaired relaxation).  2. Right ventricular systolic function is normal. The right ventricular  size is normal.  3. The mitral valve is normal in structure. No evidence of mitral valve  regurgitation. No evidence of mitral stenosis.  4. The aortic valve was not well visualized. There is mild calcification  of the aortic valve.  There is mild thickening of the aortic valve. Aortic  valve regurgitation is not visualized. No aortic stenosis is present.    Event monitor 3-01/2021 Patch Wear Time: 13 days and 17 hours (2022-03-18T15:44:35-0400 to 2022-04-01T08:57:03-399)  Patient had a min HR of 32 bpm, max HR of 162 bpm, and avg HR of 67 bpm. Predominant underlying rhythm was Sinus Rhythm. First Degree AV Block was present. Bundle Branch Block/IVCD was present. QRS morphology changes were present throughout recording.  Slight P wave morphology changes were noted. 1 run of Supraventricular Tachycardia occurred lasting 5 beats with a max rate of 133 bpm (avg 111 bpm). 9605 episode(s) of AV Block (2nd Mobitz II) occurred, lasting a total of 1 day 6 hours. AV Block  (2nd Mobitz II) was detected within +/- 45 seconds of symptomatic patient event(s). Isolated SVEs were rare (<1.0%), SVE Couplets were rare (<1.0%), and no SVE Triplets were present. Isolated VEs were rare (<1.0%), and no VE Couplets or VE Triplets  were present. MD notification criteria for Symptomatic Second Degree AV Block, Mobitz II met - report posted prior to notification per account request (DI).   1. SR/SB/ST 2. Mobitz Type 2 second degeee AVB 3. Short runs of SVT   NST 03/2020  The left ventricular ejection fraction is normal (55-65%).  Nuclear stress EF: 60%.  There was no ST segment deviation noted during stress.  There is a small defect of mild severity present in the basal inferoseptal, mid anteroseptal and mid inferoseptal location. The defect is non-reversible and worse on rest imaging. This is consistent with breast attenuation artifact. No ischemia.  This is a low risk study.   Patient Profile     75 y.o. female with a hx of pectus excavatum deformity ?resulting in mass effect on the right ventricle by CT 03/2020 >> subsequent echo with normal RV size and function, HTN, acid reflux, hiatal hernia, B12 deficiency, recent Mobitz  2 AVB and SVT, pulmonary nodule by CT 03/2020, recent 20lb weight loss,  Presented to [facility] with vague symptoms of feeling jittery in her chest, weakness, particularly b/l UE and LE weakness "like a dish rag" (not focal weakness, not neuropathy sounding) Ongoing c/o low back pain, fairly persistent nausea Chronic atypical CP  Found to have again intermittent 2:1 AVblock and transferred to [facility] for EP evaluation and consideration for PPM  Assessment & Plan     1. Intermittent 2:1 AVblock     Hard to connect her symptoms reliably at least to her heart block at this time     She has baseline conduction system disease noted  She is inclined NOT to pursue pacing if not felt to improve her symptoms Difficult to say, though suspect heart block the primary driver for her symptoms She  had intermittent 2:1 AVBlock with her monitor outpt, with only one pt symptom strip with unknown symptom complaint AVBlock rates appear mostly about 40, unchanged QRS morphology I have held her NPO until Dr. Rayann Heman has an opportunity to review her chart and see the patient for final decisions  She does have out pt EP follow up in place next week.   2. HTN     I don't see that she was on a BP controlling medicine at home     She was started on Procardia at [facility], not likely to provoke worsening of her AV block, though will switch to a PRN hydralazine for now   3. Back pain     Locates very low back near coccyx     She has a surgical scar where she locates the pain from a very remote back surgery for a ruptured disck     Recommend out patient f/u for this  4. She asks to look at the painfull area R buttock     I do not appreciate any skin lesions, or palpable masses     Also recommended to f/u out patient   5. Nausea     Not new, prior GI w/u     No vomiting, reports her usual intake     Continue out patient eval     She has been recommended to f/u with her PMD by other providers as well for reports of  unintentional weight loss for cancer screening  6. 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  Changes to above are made where necessary.  On exam, RRR.  The patient has chronic conduction system disease with LBBB and mobitz II second degree AV block. I have advised pacemaker implantation at this time.  Risks, benefits, alternatives to pacemaker implantation were discussed in detail with the patient today. The patient is aware of risks and is clear that she does not wish to proceed at this time.  I have offered concern that she could have syncope or sudden death with worsening AV conduction without a pacemaker.  She understands and continues to decline PPM   Co Sign: Thompson Grayer, MD 03/26/2021 4:21 PM

## 2021-03-26 NOTE — Care Management Obs Status (Signed)
MEDICARE OBSERVATION STATUS NOTIFICATION   Patient Details  Name: Rosalene Wardrop MRN: [id number] Date of Birth: Nov 01, 1945   Medicare Observation Status Notification Given:  Yes    Gala Lewandowsky, RN 03/26/2021, 12:53 PM

## 2021-03-26 NOTE — Discharge Instructions (Addendum)
Bradycardia, Adult Bradycardia is a slower-than-normal heartbeat. A normal resting heart rate for an adult ranges from 60 to 100 beats per minute. With bradycardia, the resting heart rate is less than 60 beats per minute. Bradycardia can prevent enough oxygen from reaching certain areas of your body when you are active. It can be serious if it keeps enough oxygen from reaching your brain and other parts of your body. Bradycardia is not a problem for everyone. For some healthy adults, a slow resting heart rate is normal. What are the causes? This condition may be caused by:  A problem with the heart, including: ? A problem with the heart's electrical system, such as a heart block. With a heart block, electrical signals between the chambers of the heart are partially or completely blocked, so they are not able to work as they should. ? A problem with the heart's natural pacemaker (sinus node). ? Heart disease. ? A heart attack. ? Heart damage. ? Lyme disease. ? A heart infection. ? A heart condition that is present at birth (congenital heart defect).  Certain medicines that treat heart conditions.  Certain conditions, such as hypothyroidism and obstructive sleep apnea.  Problems with the balance of chemicals and other substances, like potassium, in the blood.  Trauma.  Radiation therapy.   What increases the risk? You are more likely to develop this condition if you:  Are age 25 or older.  Have high blood pressure (hypertension), high cholesterol (hyperlipidemia), or diabetes.  Drink heavily, use tobacco or nicotine products, or use drugs. What are the signs or symptoms? Symptoms of this condition include:  Light-headedness.  Feeling faint or fainting.  Fatigue and weakness.  Trouble with activity or exercise.  Shortness of breath.  Chest pain (angina).  Drowsiness.  Confusion.  Dizziness. How is this diagnosed? This condition may be diagnosed based on:  Your  symptoms.  Your medical history.  A physical exam. During the exam, your health care provider will listen to your heartbeat and check your pulse. To confirm the diagnosis, your health care provider may order tests, such as:  Blood tests.  An electrocardiogram (ECG). This test records the heart's electrical activity. The test can show how fast your heart is beating and whether the heartbeat is steady.  A test in which you wear a portable device (event recorder or Holter monitor) to record your heart's electrical activity while you go about your day.  Anexercise test. How is this treated? Treatment for this condition depends on the cause of the condition and how severe your symptoms are. Treatment may involve:  Treatment of the underlying condition.  Changing your medicines or how much medicine you take.  Having a small, battery-operated device called a pacemaker implanted under the skin. When bradycardia occurs, this device can be used to increase your heart rate and help your heart beat in a regular rhythm. Follow these instructions at home: Lifestyle  Manage any health conditions that contribute to bradycardia as told by your health care provider.  Follow a heart-healthy diet. A nutrition specialist (dietitian) can help educate you about healthy food options and changes.  Follow an exercise program that is approved by your health care provider.  Maintain a healthy weight.  Try to reduce or manage your stress, such as with yoga or meditation. If you need help reducing stress, ask your health care provider.  Do not use any products that contain nicotine or tobacco, such as cigarettes, e-cigarettes, and chewing tobacco. If you  need help quitting, ask your health care provider.  Do not use illegal drugs.  Limit alcohol intake to no more than 1 drink a day for nonpregnant women and 2 drinks a day for men. Be aware of how much alcohol is in your drink. In the [place] one drink equals  one 12 oz bottle of beer (355 mL), one 5 oz glass of wine (148 mL), or one 1 oz glass of hard liquor (44 mL).   General instructions  Take over-the-counter and prescription medicines only as told by your health care provider.  Keep all follow-up visits as told by your health care provider. This is important. How is this prevented? In some cases, bradycardia may be prevented by:  Treating underlying medical problems.  Stopping behaviors or medicines that can trigger the condition. Contact a health care provider if you:  Feel light-headed or dizzy.  Almost faint.  Feel weak or are easily fatigued during physical activity.  Experience confusion or have memory problems. Get help right away if:  You faint.  You have: ? An irregular heartbeat (palpitations). ? Chest pain. ? Trouble breathing. Summary  Bradycardia is a slower-than-normal heartbeat. With bradycardia, the resting heart rate is less than 60 beats per minute.  Treatment for this condition depends on the cause.  Manage any health conditions that contribute to bradycardia as told by your health care provider.  Do not use any products that contain nicotine or tobacco, such as cigarettes, e-cigarettes, and chewing tobacco, and limit alcohol intake.  Keep all follow-up visits as told by your health care provider. This is important. This information is not intended to replace advice given to you by your health care provider. Make sure you discuss any questions you have with your health care provider. Document Revised: 04/30/2018 Document Reviewed: 03/28/2018 Elsevier Patient Education  2021 Elsevier Inc. NO DRIVING UNTIL CLEARED TO BY [place]

## 2021-03-26 NOTE — Care Management CC44 (Signed)
Condition Code 44 Documentation Completed  Patient Details  Name: Kayla Terry MRN: [id number] Date of Birth: [birth year]   Condition Code 44 given:  Yes Patient signature on Condition Code 44 notice:  Yes Documentation of 2 MD's agreement:  Yes Code 44 added to claim:  Yes    Gala Lewandowsky, RN 03/26/2021, 12:53 PM

## 2021-03-26 NOTE — Discharge Summary (Signed)
DISCHARGE SUMMARY    Patient ID: Kayla Terry,  MRN: [id number], DOB/AGE: January 18, 1946 75 y.o.  Admit date: 03/25/2021 Discharge date: 03/26/2021  Primary Care Physician: [name], MD  Primary Cardiologist: Dr. Wyline Mood Electrophysiologist: new, pending Dr. Ladona Ridgel out patient  Primary Discharge Diagnosis:  1. Mobitz II heart block  Secondary Discharge Diagnosis:  1. HTN     Not on any BP agents out patient     Has been variable here, and known for her.     Given she has follow up next week she is asked to keep a home record and bring to her appointment.     121/83 currently without medication 2. HLD 3. GERD  Allergies  Allergen Reactions  . Escitalopram Other (See Comments)    Nausea and dizziness  . Pantoprazole Other (See Comments)    headaches  . Naproxen Sodium Anxiety     Procedures This Admission:  none  Brief HPI: Kayla Terry is a 75 y.o. female w/PMHx of HTN (not on meds), pectus excavatum, GERD, hiatal hernia, chronic CP (felt to be atypical with negative ischemic w/u last year) sought attention at [facility] for ongoing weakness, back pain, cold intolerances and mentions of unintentional weight loss.  Reports b/l UE/LE weakness for quite some time as well having seen neurology for this previously as well.  There was mention of chronic CP, back pain and some degree nausea. Mentioned feeling "jittery" in her chest She was by out patient monitoring and prior hospital visits to have known intermittent 2:1 AVblock and recommended to transfer to [facility] for EP evaluation and possible PPM.     Hospital Course:  The patient via prior office visits wore a heart monitor that noted intermittent 2:1 AVblock and was set up to see EP out patient for further evaluation, noting not on any nodal blocking agents.  She had an ER visit 03/19/21, with much the same complaints, echo updated with preserved LVEF, normal RV size and function, no significant VHD, noted intermittent 2:1  AVBlock without symptoms or hemodynamic instability and discharged from the ER to f/u with EP as scheduled.  She returned to [facility] yesterday with again, as noted above and transferred here for EP consult.  She was admitted and seen by EP service Dr. Elberta Fortis, she mentioned that she had been told a PPM may not make her feel much better and if that were the case, would not be inclined to get one.  She mentioned for the past year has been feeling poorly at the time of our visit particularly persistent slight nausea and low back pain being her main complaints.   with 1:1 conduction in the 70's.  She was monitored on telemetry overnight, largely SR 1:1 70's with short periods of 2:1 Mobitz II block without prolonged or significant bradycardia.  She has been clear that she does not have episodic type symptoms, no near syncope and had never fainted.  The patient this morning was seen by Dr. Johney Frame.  They had a lengthy discussion.  She again relays that she has felt "like crap" for a year and has been seen by numerous doctors without clear cause of her symptoms.  Dr. Johney Frame discussed with her that her electrical system is aging and at baseline not working normally and with her 2:1 block has indication and recommends PPM implant with likely hood of progression/worsening of her heart block.  They discussed that it was not likely to improve all of her symptoms, or  perhaps not make her feel markedly better, though could help her if some of her symptoms are from her slow heart rates. He expressed concerns that without a PPM she may develops symptoms falling, fainting, and even death. The patient despite this conversation mentions that she has had opportunity to talk with er children and she prefers not to have a PPM and get another opinion. Dr. Johney Frame recommended that she not drive. The patient's daughter will be staying with her the next few days, she has an appointment to see Dr. Ladona Ridgel next week and recommended to  keep that.  The patient denies any activeCP/SOB and would like to be discharged to home     Physical Exam: Vitals:   03/25/21 1850 03/25/21 2128 03/26/21 0032 03/26/21 0615  BP: (!) 165/91 (!) 132/92 120/84 121/83  Pulse: 67 85 85 81  Resp:  16 18 15   Temp:  (!) 97.3 F (36.3 C) (!) 97.2 F (36.2 C) 98 F (36.7 C)  TempSrc:  Oral Oral Oral  SpO2: 98% 97% 98% 99%  Weight:      Height:        Labs:   Lab Results  Component Value Date   WBC 5.7 03/26/2021   HGB 13.4 03/26/2021   HCT 40.1 03/26/2021   MCV 88.9 03/26/2021   PLT 253 03/26/2021    Recent Labs  Lab 03/25/21 0904 03/26/21 0240  NA 139 139  K 3.4* 4.1  CL 105 103  CO2 25 28  BUN 10 10  CREATININE 0.99 0.89  CALCIUM 9.2 9.4  PROT 6.6  --   BILITOT 0.4  --   ALKPHOS 90  --   ALT 14  --   AST 19  --   GLUCOSE 118* 100*    Discharge Medications:  Allergies as of 03/26/2021      Reactions   Escitalopram Other (See Comments)   Nausea and dizziness   Pantoprazole Other (See Comments)   headaches   Naproxen Sodium Anxiety      Medication List    TAKE these medications   famotidine 20 MG tablet Commonly known as: PEPCID Take 20 mg by mouth daily as needed for heartburn.   gabapentin 100 MG capsule Commonly known as: NEURONTIN Take 100-200 mg by mouth daily as needed (for restless legs).   LORazepam 0.5 MG tablet Commonly known as: ATIVAN Take 0.5 mg by mouth 2 (two) times daily as needed for anxiety.   multivitamin tablet Take 1 tablet by mouth daily.   vitamin B-12 500 MCG tablet Commonly known as: CYANOCOBALAMIN Take 500 mcg by mouth daily.   zolpidem 10 MG tablet Commonly known as: AMBIEN Take 10 mg by mouth at bedtime.       Disposition: Home Discharge Instructions    Diet - low sodium heart healthy   Complete by: As directed    Increase activity slowly   Complete by: As directed       Follow-up Information    03/28/2021, MD Follow up.   Specialty:  Cardiology Why: 03/31/21 @ 9:30AM Contact information: [address] [place] 05/31/21 [phone]               Duration of Discharge Encounter: Greater than 30 minutes including physician time.  [phone], PA-C 03/26/2021 11:55 AM

## 2021-03-26 NOTE — Progress Notes (Signed)
Pt is alert and oriented. Discharge instructions/ AVS given to pt. 

## 2021-03-31 ENCOUNTER — Ambulatory Visit: Payer: Medicare Other | Admitting: Internal Medicine

## 2021-03-31 ENCOUNTER — Encounter: Payer: Self-pay | Admitting: Internal Medicine

## 2021-03-31 ENCOUNTER — Other Ambulatory Visit: Payer: Self-pay

## 2021-03-31 VITALS — BP 102/60 | HR 68 | Ht 63.0 in | Wt 132.8 lb

## 2021-03-31 DIAGNOSIS — I480 Paroxysmal atrial fibrillation: Secondary | ICD-10-CM

## 2021-03-31 DIAGNOSIS — I441 Atrioventricular block, second degree: Secondary | ICD-10-CM | POA: Diagnosis not present

## 2021-03-31 NOTE — Progress Notes (Signed)
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 She is a pleasant 75 yo woman with palpitations. She actually has a host of symptoms. She c/o fatigue and "shakiness". She feels pressure in her chest at times. She has worn a cardiac monitor which demonstrated periods of daytime 2:1 AV block. She also had pauses of up to 4.7 seconds. These occurred early in the morning around 5;45. However she notes that she is always out of bed by 5:30 a.m. The patient has not had frank syncope. In addition she has known conduction system disease with LBBB/IVCD and a QRS duration of 140 ms. Finally she notes that she has lost 25 lbs but workup has been negative. On my questioning she appears to be eating less. She notes that she will go out for lunch and save half of her meal and eat it later that night. Allergies  Allergen Reactions  . Escitalopram Other (See Comments)    Nausea and dizziness  . Pantoprazole Other (See Comments)    headaches  . Naproxen Sodium Anxiety     Current Outpatient Medications  Medication Sig Dispense Refill  . famotidine (PEPCID) 20 MG tablet Take 20 mg by mouth daily as needed for heartburn.    . gabapentin (NEURONTIN) 100 MG capsule Take 100-200 mg by mouth daily as needed (for restless legs).    . LORazepam (ATIVAN) 0.5 MG tablet Take 0.5 mg by mouth 2 (two) times daily as needed for anxiety.     . Multiple Vitamin (MULTIVITAMIN) tablet Take 1 tablet by mouth daily.    . vitamin B-12 (CYANOCOBALAMIN) 500 MCG tablet Take 500 mcg by mouth daily.    Marland Kitchen zolpidem (AMBIEN) 10 MG tablet Take 10 mg by mouth at bedtime.     No current facility-administered medications for this visit.     Past Medical History:  Diagnosis Date  . Allergy   . B12 deficiency 04/2020  . Cataract   . Fatty liver   . GERD (gastroesophageal reflux disease)   . Herpes zoster without complications   . Hiatal hernia   . Hypertension   . Mild mitral  regurgitation 2013  . Mobitz type 2 second degree atrioventricular block   . Pectus excavatum    pectus excavatum deformity resulting in mass effect on the RV by CT 03/2020  . Pulmonary nodule   . Shingles     ROS:   All systems reviewed and negative except as noted in the HPI.   Past Surgical History:  Procedure Laterality Date  . ABDOMINAL HYSTERECTOMY  02/1984  . BACK SURGERY    . CATARACT EXTRACTION, BILATERAL    . LUMBAR LAMINECTOMY    . OOPHORECTOMY  02/29/1984   unilateral     Family History  Problem Relation Age of Onset  . Cancer Mother   . Leukemia Mother   . Lung cancer Father   . Multiple myeloma Father   . Multiple sclerosis Sister   . Alcohol abuse Brother   . Multiple myeloma Brother   . Colon cancer Neg Hx   . Colon polyps Neg Hx   . Stomach cancer Neg Hx   . Esophageal cancer Neg Hx      Social History   Socioeconomic History  . Marital status: Widowed    Spouse name: Not on file  . Number of children: Not on file  . Years of education: Not on file  . Highest education level: Not  on file  Occupational History  . Not on file  Tobacco Use  . Smoking status: Never Smoker  . Smokeless tobacco: Never Used  Vaping Use  . Vaping Use: Never used  Substance and Sexual Activity  . Alcohol use: No  . Drug use: No  . Sexual activity: Not on file  Other Topics Concern  . Not on file  Social History Narrative   Lives alone    Her husband passed Feb 2021   Caffeine: maybe 1 cup/day, mostly water    Social Determinants of Health   Financial Resource Strain: Not on file  Food Insecurity: Not on file  Transportation Needs: Not on file  Physical Activity: Not on file  Stress: Not on file  Social Connections: Not on file  Intimate Partner Violence: Not on file     BP 102/60   Pulse 68   Ht '5\' 3"'  (1.6 m)   Wt 132 lb 12.8 oz (60.2 kg)   SpO2 98%   BMI 23.52 kg/m   Physical Exam:  Well appearing NAD HEENT: Unremarkable Neck:  No JVD,  no thyromegally Lymphatics:  No adenopathy Back:  No CVA tenderness Lungs:  Clear with no wheezes HEART:  Regular rate rhythm, no murmurs, no rubs, no clicks Abd:  soft, positive bowel sounds, no organomegally, no rebound, no guarding Ext:  2 plus pulses, no edema, no cyanosis, no clubbing Skin:  No rashes no nodules Neuro:  CN II through XII intact, motor grossly intact  EKG - reveiwed NSR with LBBB/IVCD at 140 ms.   Assess/Plan: 1. Mobitz 2 second degree AV block - she is on no AV nodal blocking drugs including no medication to control her atrial fib with RVR. I have recommended PPM insertion as I am concerned that her conduction system disease is currently symptomatic and will progress and will result in her passing out sooner than later.  2. PAF - I would like to add medication for her atrial fib but will wait until her PPM has been placed to avoid making her bradycardia worse.  3. Atrial flutter - see above. At this point I do not think that she has enough flutter to recommend catheter ablation.   Carleene Overlie Ashleen Demma,MD

## 2021-03-31 NOTE — Patient Instructions (Signed)
Medication Instructions:  Your physician recommends that you continue on your current medications as directed. Please refer to the Current Medication list given to you today.  *If you need a refill on your cardiac medications before your next appointment, please call your pharmacy*   Lab Work: None If you have labs (blood work) drawn today and your tests are completely normal, you will receive your results only by: Marland Kitchen MyChart Message (if you have MyChart) OR . A paper copy in the mail If you have any lab test that is abnormal or we need to change your treatment, we will call you to review the results.   Testing/Procedures: None   Follow-Up: At [facility], you and your health needs are our priority.  As part of our continuing mission to provide you with exceptional heart care, we have created designated Provider Care Teams.  These Care Teams include your primary Cardiologist (physician) and Advanced Practice Providers (APPs -  Physician Assistants and Nurse Practitioners) who all work together to provide you with the care you need, when you need it.  We recommend signing up for the patient portal called "MyChart".  Sign up information is provided on this After Visit Summary.  MyChart is used to connect with patients for Virtual Visits (Telemedicine).  Patients are able to view lab/test results, encounter notes, upcoming appointments, etc.  Non-urgent messages can be sent to your provider as well.   To learn more about what you can do with MyChart, go to [email or web address].    Your next appointment:   Please call our office with a date of your choice:  04/07/21 05/05/21 05/07/21  Other Instructions

## 2021-04-22 ENCOUNTER — Inpatient Hospital Stay (HOSPITAL_COMMUNITY)
Admission: EM | Disposition: A | Discharge status: Home / Self Care | Payer: Self-pay | Source: Home / Self Care | Attending: Emergency Medicine

## 2021-04-22 ENCOUNTER — Other Ambulatory Visit: Payer: Self-pay

## 2021-04-22 ENCOUNTER — Emergency Department (HOSPITAL_COMMUNITY): Payer: Medicare Other

## 2021-04-22 ENCOUNTER — Encounter (HOSPITAL_COMMUNITY): Payer: Self-pay | Admitting: Emergency Medicine

## 2021-04-22 ENCOUNTER — Observation Stay (HOSPITAL_COMMUNITY)
Admission: EM | Admit: 2021-04-22 | Discharge: 2021-04-23 | Disposition: A | Discharge status: Home / Self Care | Payer: Medicare Other | Attending: Cardiovascular Disease | Admitting: Cardiovascular Disease

## 2021-04-22 ENCOUNTER — Inpatient Hospital Stay (HOSPITAL_BASED_OUTPATIENT_CLINIC_OR_DEPARTMENT_OTHER): Payer: Medicare Other

## 2021-04-22 DIAGNOSIS — Z79899 Other long term (current) drug therapy: Secondary | ICD-10-CM | POA: Insufficient documentation

## 2021-04-22 DIAGNOSIS — R9431 Abnormal electrocardiogram [ECG] [EKG]: Secondary | ICD-10-CM | POA: Diagnosis present

## 2021-04-22 DIAGNOSIS — I10 Essential (primary) hypertension: Secondary | ICD-10-CM | POA: Diagnosis present

## 2021-04-22 DIAGNOSIS — I441 Atrioventricular block, second degree: Secondary | ICD-10-CM | POA: Diagnosis present

## 2021-04-22 DIAGNOSIS — I213 ST elevation (STEMI) myocardial infarction of unspecified site: Secondary | ICD-10-CM

## 2021-04-22 DIAGNOSIS — I2510 Atherosclerotic heart disease of native coronary artery without angina pectoris: Principal | ICD-10-CM | POA: Diagnosis present

## 2021-04-22 DIAGNOSIS — I2111 ST elevation (STEMI) myocardial infarction involving right coronary artery: Secondary | ICD-10-CM

## 2021-04-22 DIAGNOSIS — E785 Hyperlipidemia, unspecified: Secondary | ICD-10-CM | POA: Diagnosis present

## 2021-04-22 DIAGNOSIS — R079 Chest pain, unspecified: Secondary | ICD-10-CM | POA: Diagnosis present

## 2021-04-22 DIAGNOSIS — U071 COVID-19: Secondary | ICD-10-CM | POA: Diagnosis not present

## 2021-04-22 HISTORY — PX: LEFT HEART CATH AND CORONARY ANGIOGRAPHY: CATH118249

## 2021-04-22 HISTORY — DX: COVID-19: U07.1

## 2021-04-22 HISTORY — DX: Atherosclerotic heart disease of native coronary artery without angina pectoris: I25.10

## 2021-04-22 LAB — RESP PANEL BY RT-PCR (FLU A&B, COVID) ARPGX2
Influenza A by PCR: NEGATIVE
Influenza B by PCR: NEGATIVE
SARS Coronavirus 2 by RT PCR: POSITIVE — AB

## 2021-04-22 LAB — COMPREHENSIVE METABOLIC PANEL
ALT: 16 U/L (ref 0–44)
AST: 19 U/L (ref 15–41)
Albumin: 4.2 g/dL (ref 3.5–5.0)
Alkaline Phosphatase: 86 U/L (ref 38–126)
Anion gap: 8 (ref 5–15)
BUN: 11 mg/dL (ref 8–23)
CO2: 29 mmol/L (ref 22–32)
Calcium: 9.2 mg/dL (ref 8.9–10.3)
Chloride: 99 mmol/L (ref 98–111)
Creatinine, Ser: 1.15 mg/dL — ABNORMAL HIGH (ref 0.44–1.00)
GFR, Estimated: 50 mL/min — ABNORMAL LOW (ref >60)
Glucose, Bld: 109 mg/dL — ABNORMAL HIGH (ref 70–99)
Potassium: 4.3 mmol/L (ref 3.5–5.1)
Sodium: 136 mmol/L (ref 135–145)
Total Bilirubin: 0.4 mg/dL (ref 0.3–1.2)
Total Protein: 7.2 g/dL (ref 6.5–8.1)

## 2021-04-22 LAB — CBC WITH DIFFERENTIAL/PLATELET
Abs Immature Granulocytes: 0.01 10*3/uL (ref 0.00–0.07)
Basophils Absolute: 0 10*3/uL (ref 0.0–0.1)
Basophils Relative: 1 %
Eosinophils Absolute: 0 10*3/uL (ref 0.0–0.5)
Eosinophils Relative: 0 %
HCT: 44.4 % (ref 36.0–46.0)
Hemoglobin: 14.5 g/dL (ref 12.0–15.0)
Immature Granulocytes: 0 %
Lymphocytes Relative: 18 %
Lymphs Abs: 0.8 10*3/uL (ref 0.7–4.0)
MCH: 29.8 pg (ref 26.0–34.0)
MCHC: 32.7 g/dL (ref 30.0–36.0)
MCV: 91.4 fL (ref 80.0–100.0)
Monocytes Absolute: 0.7 10*3/uL (ref 0.1–1.0)
Monocytes Relative: 17 %
Neutro Abs: 2.7 10*3/uL (ref 1.7–7.7)
Neutrophils Relative %: 64 %
Platelets: 211 10*3/uL (ref 150–400)
RBC: 4.86 MIL/uL (ref 3.87–5.11)
RDW: 13.7 % (ref 11.5–15.5)
WBC: 4.2 10*3/uL (ref 4.0–10.5)
nRBC: 0 % (ref 0.0–0.2)

## 2021-04-22 LAB — D-DIMER, QUANTITATIVE: D-Dimer, Quant: 0.42 ug/mL-FEU (ref 0.00–0.50)

## 2021-04-22 LAB — HEMOGLOBIN A1C
Hgb A1c MFr Bld: 5.2 % (ref 4.8–5.6)
Mean Plasma Glucose: 102.54 mg/dL

## 2021-04-22 LAB — LIPID PANEL
Cholesterol: 230 mg/dL — ABNORMAL HIGH (ref 0–200)
HDL: 84 mg/dL (ref >40)
LDL Cholesterol: 133 mg/dL — ABNORMAL HIGH (ref 0–99)
Total CHOL/HDL Ratio: 2.7 RATIO
Triglycerides: 65 mg/dL (ref <150)
VLDL: 13 mg/dL (ref 0–40)

## 2021-04-22 LAB — TROPONIN I (HIGH SENSITIVITY)
Troponin I (High Sensitivity): 5 ng/L (ref <18)
Troponin I (High Sensitivity): 11 ng/L (ref <18)

## 2021-04-22 LAB — ECHOCARDIOGRAM COMPLETE
Area-P 1/2: 3.27 cm2
Calc EF: 54.4 %
Height: 63 in
S' Lateral: 2.5 cm
Single Plane A2C EF: 56.3 %
Single Plane A4C EF: 51.8 %
Weight: 1920 oz

## 2021-04-22 LAB — PROTIME-INR
INR: 0.9 (ref 0.8–1.2)
Prothrombin Time: 12.6 seconds (ref 11.4–15.2)

## 2021-04-22 LAB — APTT: aPTT: 30 seconds (ref 24–36)

## 2021-04-22 LAB — GROUP A STREP BY PCR: Group A Strep by PCR: NOT DETECTED

## 2021-04-22 SURGERY — LEFT HEART CATH AND CORONARY ANGIOGRAPHY
Anesthesia: LOCAL

## 2021-04-22 MED ORDER — FENTANYL CITRATE (PF) 100 MCG/2ML IJ SOLN
INTRAMUSCULAR | Status: AC
  Filled 2021-04-22: qty 2

## 2021-04-22 MED ORDER — MENTHOL 3 MG MT LOZG
1.0000 | LOZENGE | OROMUCOSAL | Status: DC | PRN
  Administered 2021-04-23 (×2): 3 mg via ORAL
  Filled 2021-04-22: qty 9

## 2021-04-22 MED ORDER — VERAPAMIL HCL 2.5 MG/ML IV SOLN
INTRAVENOUS | Status: DC | PRN
  Administered 2021-04-22: 10 mL via INTRA_ARTERIAL

## 2021-04-22 MED ORDER — HEPARIN SODIUM (PORCINE) 5000 UNIT/ML IJ SOLN
60.0000 [IU]/kg | Freq: Once | INTRAMUSCULAR | Status: AC
  Administered 2021-04-22: 3250 [IU] via INTRAVENOUS
  Filled 2021-04-22: qty 1

## 2021-04-22 MED ORDER — ACETAMINOPHEN 325 MG PO TABS
650.0000 mg | ORAL_TABLET | ORAL | Status: DC | PRN
  Administered 2021-04-22: 650 mg via ORAL
  Administered 2021-04-22: 325 mg via ORAL
  Administered 2021-04-23 (×2): 650 mg via ORAL
  Filled 2021-04-22 (×4): qty 2

## 2021-04-22 MED ORDER — ACETAMINOPHEN 325 MG PO TABS
ORAL_TABLET | ORAL | Status: AC
  Filled 2021-04-22: qty 2

## 2021-04-22 MED ORDER — HEPARIN (PORCINE) IN NACL 1000-0.9 UT/500ML-% IV SOLN
INTRAVENOUS | Status: AC
  Filled 2021-04-22: qty 1000

## 2021-04-22 MED ORDER — SODIUM CHLORIDE 0.9% FLUSH: 3.0000 mL | INTRAVENOUS | Status: DC | PRN

## 2021-04-22 MED ORDER — PERFLUTREN LIPID MICROSPHERE
1.0000 mL | INTRAVENOUS | Status: AC | PRN
  Administered 2021-04-22: 2 mL via INTRAVENOUS
  Filled 2021-04-22: qty 10

## 2021-04-22 MED ORDER — ZOLPIDEM TARTRATE 5 MG PO TABS
5.0000 mg | ORAL_TABLET | Freq: Every evening | ORAL | Status: DC | PRN
  Administered 2021-04-22: 5 mg via ORAL
  Filled 2021-04-22: qty 1

## 2021-04-22 MED ORDER — SODIUM CHLORIDE 0.9 % IV SOLN: INTRAVENOUS | Status: DC

## 2021-04-22 MED ORDER — FENTANYL CITRATE (PF) 100 MCG/2ML IJ SOLN
INTRAMUSCULAR | Status: DC | PRN
  Administered 2021-04-22: 25 ug via INTRAVENOUS

## 2021-04-22 MED ORDER — LABETALOL HCL 5 MG/ML IV SOLN: 10.0000 mg | INTRAVENOUS | Status: AC | PRN

## 2021-04-22 MED ORDER — SODIUM CHLORIDE 0.9% FLUSH
3.0000 mL | Freq: Two times a day (BID) | INTRAVENOUS | Status: DC
  Administered 2021-04-22 – 2021-04-23 (×3): 3 mL via INTRAVENOUS

## 2021-04-22 MED ORDER — ASPIRIN 325 MG PO TABS
325.0000 mg | ORAL_TABLET | Freq: Once | ORAL | Status: AC
  Administered 2021-04-22: 325 mg via ORAL
  Filled 2021-04-22: qty 1

## 2021-04-22 MED ORDER — MIDAZOLAM HCL 2 MG/2ML IJ SOLN
INTRAMUSCULAR | Status: AC
  Filled 2021-04-22: qty 2

## 2021-04-22 MED ORDER — HYDRALAZINE HCL 20 MG/ML IJ SOLN: 10.0000 mg | INTRAMUSCULAR | Status: AC | PRN

## 2021-04-22 MED ORDER — ENSURE ENLIVE PO LIQD
237.0000 mL | Freq: Two times a day (BID) | ORAL | Status: DC
  Filled 2021-04-22: qty 237

## 2021-04-22 MED ORDER — SODIUM CHLORIDE 0.9 % IV BOLUS
250.0000 mL | Freq: Once | INTRAVENOUS | Status: AC
  Administered 2021-04-22: 250 mL via INTRAVENOUS

## 2021-04-22 MED ORDER — VERAPAMIL HCL 2.5 MG/ML IV SOLN
INTRAVENOUS | Status: AC
  Filled 2021-04-22: qty 2

## 2021-04-22 MED ORDER — HEPARIN SODIUM (PORCINE) 1000 UNIT/ML IJ SOLN
INTRAMUSCULAR | Status: AC
  Filled 2021-04-22: qty 1

## 2021-04-22 MED ORDER — HEPARIN SODIUM (PORCINE) 1000 UNIT/ML IJ SOLN
INTRAMUSCULAR | Status: DC | PRN
  Administered 2021-04-22: 2000 [IU] via INTRAVENOUS

## 2021-04-22 MED ORDER — ONDANSETRON HCL 4 MG/2ML IJ SOLN: 4.0000 mg | Freq: Four times a day (QID) | INTRAMUSCULAR | Status: DC | PRN

## 2021-04-22 MED ORDER — SODIUM CHLORIDE 0.9 % IV SOLN: INTRAVENOUS | Status: AC

## 2021-04-22 MED ORDER — MIDAZOLAM HCL 2 MG/2ML IJ SOLN
INTRAMUSCULAR | Status: DC | PRN
  Administered 2021-04-22: 1 mg via INTRAVENOUS

## 2021-04-22 MED ORDER — SODIUM CHLORIDE 0.9 % IV SOLN: 250.0000 mL | INTRAVENOUS | Status: DC | PRN

## 2021-04-22 MED ORDER — LIDOCAINE HCL (PF) 1 % IJ SOLN
INTRAMUSCULAR | Status: DC | PRN
  Administered 2021-04-22: 2 mL via INTRADERMAL

## 2021-04-22 MED ORDER — NITROGLYCERIN 0.4 MG SL SUBL
0.4000 mg | SUBLINGUAL_TABLET | Freq: Once | SUBLINGUAL | Status: AC
  Administered 2021-04-22: 0.4 mg via SUBLINGUAL
  Filled 2021-04-22: qty 1

## 2021-04-22 MED ORDER — ASPIRIN 81 MG PO CHEW
81.0000 mg | CHEWABLE_TABLET | Freq: Every day | ORAL | Status: DC
  Administered 2021-04-23: 81 mg via ORAL
  Filled 2021-04-22: qty 1

## 2021-04-22 MED ORDER — LIDOCAINE HCL (PF) 1 % IJ SOLN
INTRAMUSCULAR | Status: AC
  Filled 2021-04-22: qty 30

## 2021-04-22 MED ORDER — IOHEXOL 350 MG/ML SOLN
INTRAVENOUS | Status: DC | PRN
  Administered 2021-04-22: 47 mL

## 2021-04-22 MED ORDER — ATORVASTATIN CALCIUM 10 MG PO TABS
10.0000 mg | ORAL_TABLET | Freq: Every day | ORAL | Status: DC
  Filled 2021-04-22 (×2): qty 1

## 2021-04-22 SURGICAL SUPPLY — 11 items
CATH 5FR JL3.5 JR4 ANG PIG MP (CATHETERS) ×1 IMPLANT
DEVICE RAD COMP TR BAND LRG (VASCULAR PRODUCTS) ×1 IMPLANT
ELECT DEFIB PAD ADLT CADENCE (PAD) ×1 IMPLANT
GLIDESHEATH SLEND A-KIT 6F 22G (SHEATH) ×1 IMPLANT
GUIDEWIRE INQWIRE 1.5J.035X260 (WIRE) IMPLANT
INQWIRE 1.5J .035X260CM (WIRE) ×2
KIT HEART LEFT (KITS) ×2 IMPLANT
MAT PREVALON FULL STRYKER (MISCELLANEOUS) ×1 IMPLANT
PACK CARDIAC CATHETERIZATION (CUSTOM PROCEDURE TRAY) ×2 IMPLANT
TRANSDUCER W/STOPCOCK (MISCELLANEOUS) ×2 IMPLANT
TUBING CIL FLEX 10 FLL-RA (TUBING) ×2 IMPLANT

## 2021-04-22 NOTE — ED Notes (Signed)
[facility] here to transfer Stemi Pt to Cath Lab  Dr. Estell Harpin spoke to Dr. Verdis Prime earlier.

## 2021-04-22 NOTE — ED Notes (Signed)
Called [facility] to inform them that [facility] just left [facility] for the Cath Lab at 0904.

## 2021-04-22 NOTE — ED Triage Notes (Signed)
Pt started having chest pain last night around 2200. Pt took a pepcid with no relief. Pt states she has sharp pain that comes and goes in her chest and arms. Also c/o of a sore throat.

## 2021-04-22 NOTE — H&P (Addendum)
Cardiology Admission History and Physical:   Patient ID: Kayla Terry MRN: [id number]; DOB: [birth year]   Admission date: 04/22/2021  PCP:  Christain Sacramento, MD   [facility] Providers Cardiologist:  Quay Burow, MD  Electrophysiologist:  Cristopher Peru, MD     Chief Complaint:  Chest pain  History of Present Illness:   Kayla Terry is a 75 yo female with history of GERD, HTN, tachy-brady syndrome who presented to the [facility] ED this am with c/o chest pain since 10 pm last night. Her pain had resolved on arrival to the ED around 8 am but recurred in the ED. First troponin is 5. EKG with 1 mm inferior ST elevation. Code STEMI called by ED staff after discussion with Dr. Pernell Dupre. No history of CAD. Normal stress test in June 2021. Echo 03/19/21 with LVEF=60-65%. Mild LVH. No significant valve disease.   Upon arrival to [facility] she is chest pain free. She tells me that she had onset of chest burning around 10 pm last night and it lasted all night. Her throat is sore today. No dyspnea or nausea. She has never been a smoker.    Past Medical History:  Diagnosis Date   Allergy    B12 deficiency 04/2020   Cataract    Fatty liver    GERD (gastroesophageal reflux disease)    Herpes zoster without complications    Hiatal hernia    Hypertension    Mild mitral regurgitation 2013   Mobitz type 2 second degree atrioventricular block    Pectus excavatum    pectus excavatum deformity resulting in mass effect on the RV by CT 03/2020   Pulmonary nodule    Shingles     Past Surgical History:  Procedure Laterality Date   ABDOMINAL HYSTERECTOMY  02/1984   BACK SURGERY     CATARACT EXTRACTION, BILATERAL     LUMBAR LAMINECTOMY     OOPHORECTOMY  02/29/1984   unilateral     Medications Prior to Admission: Prior to Admission medications   Medication Sig Start Date End Date Taking? Authorizing Provider  famotidine (PEPCID) 20 MG tablet Take 20 mg by mouth daily as needed for heartburn.     [provider]  gabapentin (NEURONTIN) 100 MG capsule Take 100-200 mg by mouth daily as needed (for restless legs).    [provider]  LORazepam (ATIVAN) 0.5 MG tablet Take 0.5 mg by mouth 2 (two) times daily as needed for anxiety.     [provider]  Multiple Vitamin (MULTIVITAMIN) tablet Take 1 tablet by mouth daily.    [provider]  vitamin B-12 (CYANOCOBALAMIN) 500 MCG tablet Take 500 mcg by mouth daily.    [provider]  zolpidem (AMBIEN) 10 MG tablet Take 10 mg by mouth at bedtime.    [provider]     Allergies:    Allergies  Allergen Reactions   Escitalopram Other (See Comments)    Nausea and dizziness   Pantoprazole Other (See Comments)    headaches   Naproxen Sodium Anxiety    Social History:   Social History   Socioeconomic History   Marital status: Widowed    Spouse name: Not on file   Number of children: Not on file   Years of education: Not on file   Highest education level: Not on file  Occupational History   Not on file  Tobacco Use   Smoking status: Never   Smokeless tobacco: Never  Vaping Use  Vaping Use: Never used  Substance and Sexual Activity   Alcohol use: No   Drug use: No   Sexual activity: Not on file  Other Topics Concern   Not on file  Social History Narrative   Lives alone    Her husband passed Feb 2021   Caffeine: maybe 1 cup/day, mostly water    Social Determinants of Health   Financial Resource Strain: Not on file  Food Insecurity: Not on file  Transportation Needs: Not on file  Physical Activity: Not on file  Stress: Not on file  Social Connections: Not on file  Intimate Partner Violence: Not on file    Family History:   The patient's family history includes Alcohol abuse in her brother; Cancer in her mother; Leukemia in her mother; Lung cancer in her father; Multiple myeloma in her brother and father; Multiple sclerosis in her sister. There is no history of Colon  cancer, Colon polyps, Stomach cancer, or Esophageal cancer.    ROS:  Please see the history of present illness.  All other ROS reviewed and negative.     Physical Exam/Data:   Vitals:   04/22/21 0755 04/22/21 0800 04/22/21 0801 04/22/21 0835  BP:  137/82 133/87 90/64  Pulse:  (!) 46 (!) 47 78  Resp:  (!) 21 (!) 21 15  Temp:   98.5 F (36.9 C)   TempSrc:   Oral   SpO2:  93% 94% 96%  Weight: 54.4 kg     Height: '5\' 3"'  (1.6 m)      No intake or output data in the 24 hours ending 04/22/21 0938 Last 3 Weights 04/22/2021 03/31/2021 03/25/2021  Weight (lbs) 120 lb 132 lb 12.8 oz 125 lb  Weight (kg) 54.432 kg 60.238 kg 56.7 kg     Body mass index is 21.26 kg/m.  General:  Well nourished, well developed, in no acute distress HEENT: normal Lymph: no adenopathy Neck: no JVD Endocrine:  No thryomegaly Vascular: No carotid bruits; FA pulses 2+ bilaterally without bruits  Cardiac:  normal S1, S2; RRR; no murmur  Lungs:  clear to auscultation bilaterally, no wheezing, rhonchi or rales  Abd: soft, nontender, no hepatomegaly  Ext: no LE edema Musculoskeletal:  No deformities, BUE and BLE strength normal and equal Skin: warm and dry  Neuro:  CNs 2-12 intact, no focal abnormalities noted Psych:  Normal affect    EKG:  The ECG that was done was personally reviewed and demonstrates Sinus, 2:1 AV block, IVCD, 1 mm inferior ST elevation, non-specific ST abd anterior leads.   Relevant CV Studies:   Laboratory Data:  High Sensitivity Troponin:   Recent Labs  Lab 03/25/21 0904 03/25/21 1154 04/22/21 0800  TROPONINIHS '3 4 5      ' Chemistry Recent Labs  Lab 04/22/21 0800  NA 136  K 4.3  CL 99  CO2 29  GLUCOSE 109*  BUN 11  CREATININE 1.15*  CALCIUM 9.2  GFRNONAA 50*  ANIONGAP 8    Recent Labs  Lab 04/22/21 0800  PROT 7.2  ALBUMIN 4.2  AST 19  ALT 16  ALKPHOS 86  BILITOT 0.4   Hematology Recent Labs  Lab 04/22/21 0800  WBC 4.2  RBC 4.86  HGB 14.5  HCT 44.4  MCV  91.4  MCH 29.8  MCHC 32.7  RDW 13.7  PLT 211   BNPNo results for input(s): BNP, PROBNP in the last 168 hours.  DDimer No results for input(s): DDIMER in the last 168 hours.  Radiology/Studies:  DG Chest Port 1 View  Result Date: 04/22/2021 CLINICAL DATA:  Hervey Ard LEFT-sided chest pain since yesterday, some shortness of breath EXAM: PORTABLE CHEST 1 VIEW COMPARISON:  Portable exam 0822 hours compared to 03/25/2021 FINDINGS: Upper normal heart size. Mediastinal contours and pulmonary vascularity normal. Lungs clear. Minimal chronic peribronchial thickening. No acute infiltrate, pleural effusion, or pneumothorax. Bones demineralized. IMPRESSION: Minimal chronic bronchitic changes without infiltrate. Electronically Signed   By: Lavonia Dana M.D.   On: 04/22/2021 08:59     Assessment and Plan:   Chest pain/Inferior ST elevation on EKG: No obstructive CAD on cardiac cath. Given mild to moderate disease will start a statin and ASA. Echo today to assess LV function. Will check one more troponin and d-dimer.  Second degree AV block: She has been seen recently in the EP clinic and a pacemaker was discussed. She has 2:1 AV block this am on her EKG. No syncope or near syncope at home. I am not sure we can associate the heart block with her chest pain and EKG changes. Will monitor on telemetry today.    Risk Assessment/Risk Scores:  }  TIMI Risk Score for ST  Elevation MI:   The patient's TIMI risk score is 5,  which indicates a 12.4% risk of all cause mortality at 30 days.        Severity of Illness: The appropriate patient status for this patient is INPATIENT. Inpatient status is judged to be reasonable and necessary in order to provide the required intensity of service to ensure the patient's safety. The patient's presenting symptoms, physical exam findings, and initial radiographic and laboratory data in the context of their chronic comorbidities is felt to place them at high risk for further  clinical deterioration. Furthermore, it is not anticipated that the patient will be medically stable for discharge from the hospital within 2 midnights of admission. The following factors support the patient status of inpatient.   " The patient's presenting symptoms include chest pain. " The worrisome physical exam findings include  " The initial radiographic and laboratory data are worrisome because of St elevation on EKG " The chronic co-morbidities include HTN, tachy/brady syndrome   * I certify that at the point of admission it is my clinical judgment that the patient will require inpatient hospital care spanning beyond 2 midnights from the point of admission due to high intensity of service, high risk for further deterioration and high frequency of surveillance required.*   For questions or updates, please contact [place] Please consult [email or web address] for contact info under     Signed, Lauree Chandler, MD  04/22/2021 9:38 AM

## 2021-04-22 NOTE — Progress Notes (Signed)
Patient complains of of sore throat, that started this morning. Tylenol 325 given. Baseline temp checked prior at 97.35f.

## 2021-04-22 NOTE — Progress Notes (Signed)
*  PRELIMINARY RESULTS* Echocardiogram 2D Echocardiogram with definity has been performed.  Leta Jungling M 04/22/2021, 3:38 PM

## 2021-04-22 NOTE — ED Provider Notes (Signed)
[facility] EMERGENCY DEPARTMENT Provider Note   CSN: [id number] Arrival date & time: 04/22/21  0747     History Chief Complaint  Patient presents with   Chest Pain    Kayla Terry is a 75 y.o. female.  Patient states that she started with chest discomfort at 10 PM last night and it lasted until 6 AM this morning.  When she arrived here she said she had a little bit of a sore throat but normal longer any chest pain.  The history is provided by the patient and medical records. No language interpreter was used.  Chest Pain Pain location:  L chest Pain quality: aching   Pain radiates to:  Does not radiate Pain severity:  Moderate Onset quality:  Sudden Duration: Patient here from 10 PM to 6 AM. Timing:  Intermittent Progression:  Waxing and waning Chronicity:  New Associated symptoms: no abdominal pain, no back pain, no cough, no fatigue and no headache       Past Medical History:  Diagnosis Date   Allergy    B12 deficiency 04/2020   Cataract    Fatty liver    GERD (gastroesophageal reflux disease)    Herpes zoster without complications    Hiatal hernia    Hypertension    Mild mitral regurgitation 2013   Mobitz type 2 second degree atrioventricular block    Pectus excavatum    pectus excavatum deformity resulting in mass effect on the RV by CT 03/2020   Pulmonary nodule    Shingles     Patient Active Problem List   Diagnosis Date Noted   2nd degree AV block 03/25/2021   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     Family History  Problem Relation Age of Onset   Cancer Mother    Leukemia Mother    Lung cancer Father    Multiple myeloma Father    Multiple sclerosis Sister    Alcohol abuse Brother    Multiple myeloma Brother    Colon cancer Neg Hx    Colon polyps Neg Hx    Stomach cancer Neg Hx    Esophageal cancer Neg Hx     Social History   Tobacco Use   Smoking status: Never   Smokeless tobacco: Never  Vaping Use   Vaping Use: Never used  Substance Use Topics   Alcohol use: No   Drug use: No    Home Medications Prior to Admission medications   Medication Sig Start Date End Date Taking? Authorizing Provider  famotidine (PEPCID) 20 MG tablet Take 20 mg by mouth daily as needed for heartburn.    [provider]  gabapentin (NEURONTIN) 100 MG capsule Take 100-200 mg by mouth daily as needed (for restless legs).    [provider]  LORazepam (ATIVAN) 0.5 MG tablet Take 0.5 mg by mouth 2 (two) times daily as needed for anxiety.     [provider]  Multiple Vitamin (MULTIVITAMIN) tablet Take 1 tablet by mouth daily.    [provider]  vitamin B-12 (CYANOCOBALAMIN) 500 MCG tablet Take 500 mcg by mouth daily.    [provider]  zolpidem (AMBIEN) 10 MG tablet Take 10 mg by mouth at bedtime.    [provider]    Allergies    Escitalopram, Pantoprazole, and Naproxen sodium  Review of Systems   Review of Systems  Constitutional:  Negative for appetite change and fatigue.  HENT:  Negative for congestion, ear discharge and sinus pressure.   Eyes:  Negative for discharge.  Respiratory:  Negative for cough.   Cardiovascular:  Positive for chest pain.  Gastrointestinal:  Negative for abdominal pain and diarrhea.  Genitourinary:  Negative for frequency and hematuria.  Musculoskeletal:  Negative for back pain.  Skin:  Negative for rash.  Neurological:  Negative for  seizures and headaches.  Psychiatric/Behavioral:  Negative for hallucinations.    Physical Exam Updated Vital Signs BP 133/87   Pulse (!) 47   Temp 98.5 F (36.9 C) (Oral)   Resp (!) 21   Ht '5\' 3"'  (1.6 m)   Wt 54.4 kg   SpO2 94%   BMI 21.26 kg/m   Physical Exam Vitals and nursing note reviewed.  Constitutional:      Appearance: She is well-developed.  HENT:     Head: Normocephalic.     Nose: Nose normal.  Eyes:     General: No scleral icterus.    Conjunctiva/sclera: Conjunctivae normal.  Neck:     Thyroid: No thyromegaly.  Cardiovascular:     Rate and Rhythm: Normal rate and regular rhythm.     Heart sounds: No murmur heard.   No friction rub. No gallop.  Pulmonary:     Breath sounds: No stridor. No wheezing or rales.  Chest:     Chest wall: No tenderness.  Abdominal:     General: There is no distension.     Tenderness: There is no abdominal tenderness. There is no rebound.  Musculoskeletal:        General: Normal range of motion.     Cervical back: Neck supple.  Lymphadenopathy:     Cervical: No cervical adenopathy.  Skin:    Findings: No erythema or rash.  Neurological:     Mental Status: She is alert and oriented to person, place, and time.     Motor: No abnormal muscle tone.     Coordination: Coordination normal.  Psychiatric:        Behavior: Behavior normal.    ED Results / Procedures / Treatments   Labs (all labs ordered are listed, but only abnormal results are displayed) Labs Reviewed  RESP PANEL BY RT-PCR (FLU A&B, COVID) ARPGX2  GROUP A STREP BY PCR  CBC WITH DIFFERENTIAL/PLATELET  COMPREHENSIVE METABOLIC PANEL  HEMOGLOBIN A1C  PROTIME-INR  APTT  LIPID PANEL  TROPONIN I (HIGH SENSITIVITY)    EKG None  Radiology No results found.  Procedures Procedures   Medications Ordered in ED Medications  0.9 %  sodium chloride infusion (has no administration in time range)  heparin injection 3,250 Units (has no administration in time  range)  aspirin tablet 325 mg (325 mg Oral Given 04/22/21 0825)  nitroGLYCERIN (NITROSTAT) SL tablet 0.4 mg (0.4 mg Sublingual Given 04/22/21 4585)    ED Course  I have reviewed the triage vital signs and the nursing notes.  Pertinent labs & imaging results that were available during my care  of the patient were reviewed by me and considered in my medical decision making (see chart for details). CRITICAL CARE Performed by: Milton Ferguson Total critical care time: 40 minutes Critical care time was exclusive of separately billable procedures and treating other patients. Critical care was necessary to treat or prevent imminent or life-threatening deterioration. Critical care was time spent personally by me on the following activities: development of treatment plan with patient and/or surrogate as well as nursing, discussions with consultants, evaluation of patient's response to treatment, examination of patient, obtaining history from patient or surrogate, ordering and performing treatments and interventions, ordering and review of laboratory studies, ordering and review of radiographic studies, pulse oximetry and re-evaluation of patient's condition.  Initially patient was seen and she no longer had any chest pain but the pain did come back in the ED around 8 AM.  First EKG was equivocal for STEMI second EKG was suggesting more like a STEMI and I spoke with Dr. Pernell Dupre, STEMI doctor and he stated it was subtle on EKG but he wanted a STEMI called.  Patient was transferred immediately to the Cath Lab at [place] Rules/Calculators/A&P                          Chest pain with EKG changes suggesting inferior MI.  STEMI Final Clinical Impression(s) / ED Diagnoses Final diagnoses:  None    Rx / DC Orders ED Discharge Orders     None        Milton Ferguson, MD 04/22/21 [phone]

## 2021-04-22 NOTE — Discharge Instructions (Signed)
Patient with a STEMI.  She is being transferred to the to the Cath Lab directly Dr. Katrinka Blazing to see the patient

## 2021-04-22 NOTE — ED Notes (Signed)
Pt left her Car Keys here for her son to get her car.  Pts son Loraine Leriche is aware.

## 2021-04-22 NOTE — Progress Notes (Signed)
LOCATION: right RADIAL  DEFLATED PER PROTOCOL: YES  TIME BAND OFF/DRESSING APPLIED: 1146  SITE UPON ARRIVAL: LEVEL 0  SITE AFTER BAND REMOVAL: LEVEL 0  CIRCULATION SENSATION AND  MOVEMENT: yes, bilateral radial pulses at +2  COMMENTS:

## 2021-04-22 NOTE — Progress Notes (Signed)
Patient c/o sore throat and also patient refuse lipitor this evening Card PA paged  x2 awaiting call back. Will continue monitor.

## 2021-04-22 NOTE — ED Notes (Signed)
Called [facility] for truck to transfer [facility] to [facility].

## 2021-04-22 NOTE — ED Notes (Signed)
Date and time results received: 04/22/21 11:57 AM  Test: Covid  Critical Value: Positive  Name of Provider Notified: [facility] Cath Lab supervisor  Orders Received? Or Actions Taken? See orders.

## 2021-04-23 ENCOUNTER — Other Ambulatory Visit (HOSPITAL_COMMUNITY): Payer: Self-pay

## 2021-04-23 ENCOUNTER — Encounter: Payer: Self-pay | Admitting: Physician Assistant

## 2021-04-23 ENCOUNTER — Encounter (HOSPITAL_COMMUNITY): Payer: Self-pay | Admitting: Cardiovascular Disease

## 2021-04-23 DIAGNOSIS — I25119 Atherosclerotic heart disease of native coronary artery with unspecified angina pectoris: Secondary | ICD-10-CM | POA: Diagnosis not present

## 2021-04-23 DIAGNOSIS — I4430 Unspecified atrioventricular block: Secondary | ICD-10-CM

## 2021-04-23 DIAGNOSIS — E785 Hyperlipidemia, unspecified: Secondary | ICD-10-CM

## 2021-04-23 DIAGNOSIS — I2510 Atherosclerotic heart disease of native coronary artery without angina pectoris: Secondary | ICD-10-CM | POA: Insufficient documentation

## 2021-04-23 DIAGNOSIS — U071 COVID-19: Secondary | ICD-10-CM | POA: Diagnosis not present

## 2021-04-23 LAB — CBC
HCT: 43.5 % (ref 36.0–46.0)
Hemoglobin: 14.2 g/dL (ref 12.0–15.0)
MCH: 29.1 pg (ref 26.0–34.0)
MCHC: 32.6 g/dL (ref 30.0–36.0)
MCV: 89.1 fL (ref 80.0–100.0)
Platelets: 177 10*3/uL (ref 150–400)
RBC: 4.88 MIL/uL (ref 3.87–5.11)
RDW: 13.7 % (ref 11.5–15.5)
WBC: 5 10*3/uL (ref 4.0–10.5)
nRBC: 0 % (ref 0.0–0.2)

## 2021-04-23 LAB — BASIC METABOLIC PANEL
Anion gap: 8 (ref 5–15)
BUN: 11 mg/dL (ref 8–23)
CO2: 26 mmol/L (ref 22–32)
Calcium: 8.6 mg/dL — ABNORMAL LOW (ref 8.9–10.3)
Chloride: 103 mmol/L (ref 98–111)
Creatinine, Ser: 1.12 mg/dL — ABNORMAL HIGH (ref 0.44–1.00)
GFR, Estimated: 51 mL/min — ABNORMAL LOW (ref >60)
Glucose, Bld: 123 mg/dL — ABNORMAL HIGH (ref 70–99)
Potassium: 3.8 mmol/L (ref 3.5–5.1)
Sodium: 137 mmol/L (ref 135–145)

## 2021-04-23 MED ORDER — ENOXAPARIN SODIUM 40 MG/0.4ML IJ SOSY
40.0000 mg | PREFILLED_SYRINGE | INTRAMUSCULAR | Status: DC
  Administered 2021-04-23: 40 mg via SUBCUTANEOUS
  Filled 2021-04-23: qty 0.4

## 2021-04-23 MED ORDER — GUAIFENESIN ER 600 MG PO TB12
600.0000 mg | ORAL_TABLET | Freq: Two times a day (BID) | ORAL | Status: DC
  Administered 2021-04-23: 600 mg via ORAL
  Filled 2021-04-23: qty 1

## 2021-04-23 MED ORDER — NIRMATRELVIR & RITONAVIR 20 X 150 MG & 10 X 100MG PO TBPK
2.0000 | ORAL_TABLET | Freq: Two times a day (BID) | ORAL | 0 refills | Status: AC
  Filled 2021-04-23: qty 20, 5d supply, fill #0

## 2021-04-23 MED ORDER — NIRMATRELVIR/RITONAVIR (PAXLOVID) TABLET (RENAL DOSING)
2.0000 | ORAL_TABLET | Freq: Two times a day (BID) | ORAL | Status: DC
  Filled 2021-04-23 (×2): qty 20

## 2021-04-23 MED ORDER — GUAIFENESIN ER 600 MG PO TB12
1200.0000 mg | ORAL_TABLET | Freq: Two times a day (BID) | ORAL | 0 refills | Status: AC
  Filled 2021-04-23: qty 40, 10d supply, fill #0

## 2021-04-23 MED ORDER — ATORVASTATIN CALCIUM 80 MG PO TABS: 80.0000 mg | ORAL_TABLET | Freq: Every day | ORAL | Status: DC

## 2021-04-23 MED ORDER — ATORVASTATIN CALCIUM 80 MG PO TABS
80.0000 mg | ORAL_TABLET | Freq: Every day | ORAL | 3 refills | Status: DC
  Filled 2021-04-23: qty 90, 90d supply, fill #0

## 2021-04-23 MED ORDER — ASPIRIN 81 MG PO TBEC
81.0000 mg | DELAYED_RELEASE_TABLET | Freq: Every day | ORAL | 2 refills | Status: DC
  Filled 2021-04-23: qty 150, 150d supply, fill #0

## 2021-04-23 MED ORDER — FLUTICASONE PROPIONATE HFA 110 MCG/ACT IN AERO
1.0000 | INHALATION_SPRAY | Freq: Two times a day (BID) | RESPIRATORY_TRACT | 12 refills | Status: DC
  Filled 2021-04-23: qty 12, 30d supply, fill #0

## 2021-04-23 MED FILL — Heparin Sod (Porcine)-NaCl IV Soln 1000 Unit/500ML-0.9%: INTRAVENOUS | Qty: 1000 | Status: AC

## 2021-04-23 NOTE — Care Management Obs Status (Signed)
MEDICARE OBSERVATION STATUS NOTIFICATION   Patient Details  Name: Kayla Terry MRN: [id number] Date of Birth: 1946-04-23   Medicare Observation Status Notification Given:  Yes    Leone Haven, RN 04/23/2021, 4:09 PM

## 2021-04-23 NOTE — Plan of Care (Signed)

## 2021-04-23 NOTE — Care Management CC44 (Signed)
Condition Code 44 Documentation Completed  Patient Details  Name: Kayla Terry MRN: [id number] Date of Birth: Sep 23, 1946   Condition Code 44 given:  Yes Patient signature on Condition Code 44 notice:  Yes Documentation of 2 MD's agreement:  Yes Code 44 added to claim:  Yes    Leone Haven, RN 04/23/2021, 4:09 PM

## 2021-04-23 NOTE — Progress Notes (Signed)
Progress Note  Patient Name: Kayla Terry Date of Encounter: 04/23/2021  [facility] Cardiologist: Nanetta Batty, MD   Subjective   No chest pain, but with progressive cough  Inpatient Medications    Scheduled Meds:  aspirin  81 mg Oral Daily   atorvastatin  10 mg Oral Daily   feeding supplement  237 mL Oral BID BM   sodium chloride flush  3 mL Intravenous Q12H   Continuous Infusions:  sodium chloride     sodium chloride     PRN Meds: sodium chloride, acetaminophen, menthol-cetylpyridinium, ondansetron (ZOFRAN) IV, sodium chloride flush, zolpidem   Vital Signs    Vitals:   04/22/21 2123 04/23/21 0042 04/23/21 0400 04/23/21 0801  BP:  138/85 124/62   Pulse: (!) 54 (!) 48 (!) 45   Resp: 16 16 (!) 25 18  Temp:  98.3 F (36.8 C) 99.6 F (37.6 C) 97.9 F (36.6 C)  TempSrc:  Oral Oral Oral  SpO2: 96% 95% 90%   Weight:   58.2 kg   Height:        Intake/Output Summary (Last 24 hours) at 04/23/2021 1119 Last data filed at 04/23/2021 0858 Gross per 24 hour  Intake 618 ml  Output --  Net 618 ml   Last 3 Weights 04/23/2021 04/22/2021 03/31/2021  Weight (lbs) 128 lb 3.2 oz 120 lb 132 lb 12.8 oz  Weight (kg) 58.151 kg 54.432 kg 60.238 kg      Telemetry     Sinus rhythm with periods of 2-1 AV block- Personally Reviewed  ECG    2:1 AV block and sinus bradycardia  HR 45 - Personally Reviewed  Physical Exam   GEN: No acute distress.  Coughing almost every minute. Neck: No JVD Cardiac: Predominant regular rhythm with periods of intermittent slow heart rates.  1/6 systolic murmur.  No S3 gallop. Respiratory: No rhonchi or wheezes GI: Nontender Right radial access site with mild area of ecchymosis MS: No edema; No deformity. Neuro:  Nonfocal  Psych: Normal affect   Labs    High Sensitivity Troponin:   Recent Labs  Lab 03/25/21 0904 03/25/21 1154 04/22/21 0800 04/22/21 1547  TROPONINIHS 3 4 5 11       Chemistry Recent Labs  Lab 04/22/21 0800  04/23/21 0304  NA 136 137  K 4.3 3.8  CL 99 103  CO2 29 26  GLUCOSE 109* 123*  BUN 11 11  CREATININE 1.15* 1.12*  CALCIUM 9.2 8.6*  PROT 7.2  --   ALBUMIN 4.2  --   AST 19  --   ALT 16  --   ALKPHOS 86  --   BILITOT 0.4  --   GFRNONAA 50* 51*  ANIONGAP 8 8     Hematology Recent Labs  Lab 04/22/21 0800 04/23/21 0304  WBC 4.2 5.0  RBC 4.86 4.88  HGB 14.5 14.2  HCT 44.4 43.5  MCV 91.4 89.1  MCH 29.8 29.1  MCHC 32.7 32.6  RDW 13.7 13.7  PLT 211 177    BNPNo results for input(s): BNP, PROBNP in the last 168 hours.   DDimer  Recent Labs  Lab 04/22/21 1547  DDIMER 0.42     Lipid Panel     Component Value Date/Time   CHOL 230 (H) 04/22/2021 0800   TRIG 65 04/22/2021 0800   HDL 84 04/22/2021 0800   CHOLHDL 2.7 04/22/2021 0800   VLDL 13 04/22/2021 0800   LDLCALC 133 (H) 04/22/2021 0800    Radiology  CARDIAC CATHETERIZATION  Result Date: 04/22/2021  Prox RCA to Mid RCA lesion is 30% stenosed.  RPDA lesion is 30% stenosed.  1st Mrg lesion is 50% stenosed.  Mid LAD lesion is 50% stenosed.  The left ventricular systolic function is normal.  LV end diastolic pressure is normal.  The left ventricular ejection fraction is 55-65% by visual estimate.  There is no mitral valve regurgitation.  1. Moderate non-obstructive disease in the mid LAD 2. Mild non-obstructive disease in the Circumflex and dominant RCA 3. Normal LV systolic function 4. Normal LV filling pressure. Recommendations: No severe CAD noted on emergent cardiac cath. Will admit to telemetry. Echo later today. Will get one more troponin. Will check a d-dimer. Follow on telemetry. She does have recent history of high grade AV block.   DG Chest Port 1 View  Result Date: 04/22/2021 CLINICAL DATA:  Lambert Mody LEFT-sided chest pain since yesterday, some shortness of breath EXAM: PORTABLE CHEST 1 VIEW COMPARISON:  Portable exam 0822 hours compared to 03/25/2021 FINDINGS: Upper normal heart size. Mediastinal  contours and pulmonary vascularity normal. Lungs clear. Minimal chronic peribronchial thickening. No acute infiltrate, pleural effusion, or pneumothorax. Bones demineralized. IMPRESSION: Minimal chronic bronchitic changes without infiltrate. Electronically Signed   By: Ulyses Southward M.D.   On: 04/22/2021 08:59   ECHOCARDIOGRAM COMPLETE  Result Date: 04/22/2021    ECHOCARDIOGRAM REPORT   Patient Name:   Kayla Terry Date of Exam: 04/22/2021 Medical Rec #:  [id number]      Height:       63.0 in Accession #:    [id number]     Weight:       120.0 lb Date of Birth:  1946-10-05      BSA:          1.556 m Patient Age:    75 years       BP:           123/73 mmHg Patient Gender: F              HR:           44 bpm. 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 Risk Factors:Hypertension and Dyslipidemia.                 COVID 19. Chest tightness. Mobitz type 2 second degree                 atrioventricular block, pectus excavatum deformity.  Sonographer:    Leta Jungling RDCS Referring Phys: [id number] CHRISTOPHER D MCALHANY IMPRESSIONS  1. Left ventricular ejection fraction, by estimation, is 55 to 60%. The left ventricle has normal function. The left ventricle has no regional wall motion abnormalities. Left ventricular diastolic parameters are consistent with Grade I diastolic dysfunction (impaired relaxation).  2. Right ventricular systolic function is normal. The right ventricular size is normal.  3. The mitral valve is normal in structure. Mild mitral valve regurgitation. No evidence of mitral stenosis.  4. The aortic valve is tricuspid. Aortic valve regurgitation is not visualized. Mild aortic valve sclerosis is present, with no evidence of aortic valve stenosis.  5. The inferior vena cava  is  normal in size with greater than 50% respiratory variability, suggesting right atrial pressure of 3 mmHg. FINDINGS  Left Ventricle: Left ventricular ejection fraction, by estimation, is 55 to 60%. The left ventricle has normal function. The left ventricle has no regional wall motion abnormalities. Definity contrast agent was given IV to delineate the left ventricular  endocardial borders. The left ventricular internal cavity size was normal in size. There is no left ventricular hypertrophy. Abnormal (paradoxical) septal motion, consistent with left bundle branch block. Left ventricular diastolic parameters are consistent with Grade I diastolic dysfunction (impaired relaxation). Right Ventricle: The right ventricular size is normal. Right ventricular systolic function is normal. Left Atrium: Left atrial size was normal in size. Right Atrium: Right atrial size was normal in size. Pericardium: There is no evidence of pericardial effusion. Mitral Valve: The mitral valve is normal in structure. Mild mitral valve regurgitation. No evidence of mitral valve stenosis. Tricuspid Valve: The tricuspid valve is normal in structure. Tricuspid valve regurgitation is trivial. No evidence of tricuspid stenosis. Aortic Valve: The aortic valve is tricuspid. Aortic valve regurgitation is not visualized. Mild aortic valve sclerosis is present, with no evidence of aortic valve stenosis. Pulmonic Valve: The pulmonic valve was normal in structure. Pulmonic valve regurgitation is not visualized. No evidence of pulmonic stenosis. Aorta: The aortic root is normal in size and structure. Venous: The inferior vena cava is normal in size with greater than 50% respiratory variability, suggesting right atrial pressure of 3 mmHg. IAS/Shunts: No atrial level shunt detected by color flow Doppler.  LEFT VENTRICLE PLAX 2D LVIDd:         3.60 cm     Diastology LVIDs:         2.50 cm     LV e' medial:    4.56 cm/s LV PW:         0.80 cm     LV E/e'  medial:  14.1 LV IVS:        0.80 cm     LV e' lateral:   6.42 cm/s LVOT diam:     1.60 cm     LV E/e' lateral: 10.0 LV SV:         29 LV SV Index:   18 LVOT Area:     2.01 cm  LV Volumes (MOD) LV vol d, MOD A2C: 54.4 ml LV vol d, MOD A4C: 88.6 ml LV vol s, MOD A2C: 23.8 ml LV vol s, MOD A4C: 42.7 ml LV SV MOD A2C:     30.6 ml LV SV MOD A4C:     88.6 ml LV SV MOD BP:      40.4 ml RIGHT VENTRICLE RV S prime:     9.75 cm/s TAPSE (M-mode): 1.1 cm LEFT ATRIUM             Index       RIGHT ATRIUM          Index LA diam:        2.30 cm 1.48 cm/m  RA Area:     8.11 cm LA Vol (A2C):   23.1 ml 14.84 ml/m RA Volume:   12.20 ml 7.84 ml/m LA Vol (A4C):   14.2 ml 9.12 ml/m LA Biplane Vol: 19.8 ml 12.72 ml/m  AORTIC VALVE LVOT Vmax:   77.15 cm/s LVOT Vmean:  48.800 cm/s LVOT VTI:    0.142 m  AORTA Ao Root diam: 3.00 cm Ao Asc diam:  3.10 cm MITRAL VALVE MV Area (PHT):  3.27 cm    SHUNTS MV Decel Time: 232 msec    Systemic VTI:  0.14 m MV E velocity: 64.20 cm/s  Systemic Diam: 1.60 cm MV A velocity: 86.80 cm/s MV E/A ratio:  0.74 Olga Millers MD Electronically signed by Olga Millers MD Signature Date/Time: 04/22/2021/3:46:41 PM    Final     Cardiac Studies   Left heart cath 04/22/21: Prox RCA to Mid RCA lesion is 30% stenosed. RPDA lesion is 30% stenosed. 1st Mrg lesion is 50% stenosed. Mid LAD lesion is 50% stenosed. The left ventricular systolic function is normal. LV end diastolic pressure is normal. The left ventricular ejection fraction is 55-65% by visual estimate. There is no mitral valve regurgitation.   1. Moderate non-obstructive disease in the mid LAD 2. Mild non-obstructive disease in the Circumflex and dominant RCA 3. Normal LV systolic function 4. Normal LV filling pressure.   Recommendations: No severe CAD noted on emergent cardiac cath. Will admit to telemetry. Echo later today. Will get one more troponin. Will check a d-dimer. Follow on telemetry. She does have recent history of high  grade AV block.    ____________  Echo 04/22/21: 1. Left ventricular ejection fraction, by estimation, is 55 to 60%. The  left ventricle has normal function. The left ventricle has no regional  wall motion abnormalities. Left ventricular diastolic parameters are  consistent with Grade I diastolic  dysfunction (impaired relaxation).   2. Right ventricular systolic function is normal. The right ventricular  size is normal.   3. The mitral valve is normal in structure. Mild mitral valve  regurgitation. No evidence of mitral stenosis.   4. The aortic valve is tricuspid. Aortic valve regurgitation is not  visualized. Mild aortic valve sclerosis is present, with no evidence of  aortic valve stenosis.   5. The inferior vena cava is normal in size with greater than 50%  respiratory variability, suggesting right atrial pressure of 3 mmHg.    Patient Profile     75 y.o. female with history of GERD, HTN, tachy-brady syndrome who presented to the [facility] 04/22/21 with c/o chest pain since 10 pm last night. Her pain had resolved on arrival to the ED around 8 am but recurred in the ED. First troponin is 5. EKG with 1 mm inferior ST elevation. Code STEMI called by ED staff after discussion with Dr. Garnette Scheuermann. No history of CAD. Normal stress test in June 2021. Echo 03/19/21 with LVEF=60-65%. Mild LVH. No significant valve disease.   Assessment & Plan    Chest pain Inferior STEMI Pt was taken urgently to cath lab. Heart cath showed no obstructive CAD. Given mild disease, was started on ASA and statin. HS troponin remained negative.    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, has discussed possible PPM placement - telemetry appears to be sinus rhythm with periods of junctional bradycardia   Hyperlipidemia with LDL goal < 70 04/22/2021: Cholesterol 230; HDL 84; LDL Cholesterol 133; Triglycerides 65; VLDL 13 Will increase her lipitor from 10 to 80 mg - however, pt refused stating "my  cholesterol is normal" - Recheck lipids in 6 weeks   COVID-19 positive - negative PCR 4 weeks ago - was symptomatic with sore throat, strep negative - question if this contributed to her chest pain - D-dimer negative - will start PPX lovenox - may need to consider IM consult for management depending on vaccination status and symptoms/symptoms onset - is high risk and may qualify for  paxlovid - on room air, not necessarily hospitalized for COVID symptoms, but cath with nonobstructive disease - will defer to IM/ID - will hold lipitor dose today until there is a determination regarding COVID treatement    For questions or updates, please contact [facility] Please consult [email or web address] for contact info under        Signed, Marcelino Dusterngela Nicole Duke, PA  04/23/2021, 11:19 AM     Patient seen and examined.  Kayla Terry is a 75 year old female who [facility] ST segment elevation myocardial infarction.  She underwent emergent cardiac catheterization which did not reveal high-grade CAD but she was noted to have 50% mid LAD stenosis and 50% obtuse marginal stenosis.  She has documented intermittent second-degree AV block and has seen EP in the past with discussion regarding potential future need for permanent pacemaker insertion.  The patient is unvaccinated.  4 weeks ago she presented with fatigue and was found to have second-degree heart block she had a negative PCR test for COVID.  However over the past several days she has had increased sore throat and progressive cough.  She states her temperature on check was 99.6 6 admission but ultimately has been normal. Over the past day she has had progressive cough with phlegm production.  Presently, in the room she was coughing every couple of minutes.  O2 saturation is in the 90s. She denied any chest pressure.  There was no JVD.  She did not have rales.  Rhythm was regular with  occasional episodes of slower heart rate with a faint 1/6 systolic murmur.  Abdomen soft nontender.  Right radial cath site was stable with mild ecchymosis.  There was no edema.  I reviewed the patient's cardiac catheterization.  She does have hyperlipidemia and is in need for initiation of statin therapy.  However, with her unvaccinated status with positive COVID with progressive cough I have recommended the hospitalist service see the patient for consideration of monoclonal antibody since she is unvaccinated with greater potential to have more serious illness from COVID vs remdesivir vs oral paxlovid.  Ultimately the patient will need to start high intensity statin therapy with her elevated lipids and CAD with target LDL less than 7 but if paxlovid is used this will need to be deferred due to potential side effect.   Lennette Biharihomas A. Chayanne Speir, MD, [facility] 04/23/2021 2:14 PM

## 2021-04-23 NOTE — Discharge Summary (Signed)
Discharge Summary    Patient ID: Kayla Terry MRN: [id number]; DOB: 02/02/1946  Admit date: 04/22/2021 Discharge date: 04/23/2021  PCP:  [name], MD   [facility] Providers Cardiologist:  Nanetta Batty, MD  Electrophysiologist:  Lewayne Bunting, MD  {   Discharge Diagnoses    Principal Problem:   Coronary artery disease Active Problems:   Benign essential hypertension   Hyperlipidemia   2nd degree AV block   Chest pain of uncertain etiology   Nonspecific abnormal electrocardiogram (ECG) (EKG)   COVID-19 virus infection   Diagnostic Studies/Procedures    Left heart cath 04/22/21: Prox RCA to Mid RCA lesion is 30% stenosed. RPDA lesion is 30% stenosed. 1st Mrg lesion is 50% stenosed. Mid LAD lesion is 50% stenosed. The left ventricular systolic function is normal. LV end diastolic pressure is normal. The left ventricular ejection fraction is 55-65% by visual estimate. There is no mitral valve regurgitation.   1. Moderate non-obstructive disease in the mid LAD 2. Mild non-obstructive disease in the Circumflex and dominant RCA 3. Normal LV systolic function 4. Normal LV filling pressure.   Recommendations: No severe CAD noted on emergent cardiac cath. Will admit to telemetry. Echo later today. Will get one more troponin. Will check a d-dimer. Follow on telemetry. She does have recent history of high grade AV block.      ____________   Echo 04/22/21: 1. Left ventricular ejection fraction, by estimation, is 55 to 60%. The  left ventricle has normal function. The left ventricle has no regional  wall motion abnormalities. Left ventricular diastolic parameters are  consistent with Grade I diastolic  dysfunction (impaired relaxation).   2. Right ventricular systolic function is normal. The right ventricular  size is normal.   3. The mitral valve is normal in structure. Mild mitral valve  regurgitation. No evidence of mitral stenosis.   4. The aortic valve is  tricuspid. Aortic valve regurgitation is not  visualized. Mild aortic valve sclerosis is present, with no evidence of  aortic valve stenosis.   5. The inferior vena cava is normal in size with greater than 50%  respiratory variability, suggesting right atrial pressure of 3 mmHg. _____________   History of Present Illness     Kayla Terry is a 75 y.o. female with history of GERD, HTN, tachy-brady syndrome who presented to the [facility] ED 04/22/21 with c/o chest pain since 10 pm last night. Her pain had resolved on arrival to the ED around 8 am but recurred in the ED. First troponin is 5. EKG with 1 mm inferior ST elevation. Code STEMI called by ED staff after discussion with Dr. Garnette Scheuermann. No history of CAD. Normal stress test in June 2021. Echo 03/19/21 with LVEF=60-65%. Mild LVH. No significant valve disease.   Hospital Course     Consultants: Internal Medicine  Chest pain ST elevation in inferior leads Pt was taken urgently to the cath lab. Heart cath showed no obstructive CAD. She has 50% mid LAD stenosis that will be treated medically. HS troponin remained negative. Consider noncardiac chest pain. Given nonobstructive disease, work on risk factor modification.   Second degree AV block Telemetry with sinus rhythm, junctional bradycardia. She follows with EP.    Hyperlipidemia with LDL goal < 70 04/22/2021: Cholesterol 230; HDL 84; LDL Cholesterol 133; Triglycerides 65; VLDL 13 She was started on low dose lipitor and initially refused stating her PCP reported her cholesterol was good. We reviewed that in the setting of CAD, her  lipid panel goals are different. She is amenable to taking 80 mg lipitor. This will start 3 days after she finishes paxlovid.    COVID-19 infection She was incidentally found to be COVID positive. She is symptomatic with cough, congestion, and sore throat. She is unvaccinated and is high risk for complications. Internal medicine was consulted to provide  recommendations for MAB vs remdesivir vs paxlovid. Paxlovid was recommended along with supportive care including mucinex and pulmicort inhaler. She will follow up with PCP and was given strict return precautions. She is on room air.     Did the patient have an acute coronary syndrome (MI, NSTEMI, STEMI, etc) this admission?:  No                               Did the patient have a percutaneous coronary intervention (stent / angioplasty)?:  No.       _____________  Discharge Vitals Blood pressure (!) 150/75, pulse (!) 49, temperature 97.9 F (36.6 C), temperature source Oral, resp. rate 13, height 5\' 3"  (1.6 m), weight 58.2 kg, SpO2 94 %.  Filed Weights   04/22/21 0755 04/23/21 0400  Weight: 54.4 kg 58.2 kg    Labs & Radiologic Studies    CBC Recent Labs    04/22/21 0800 04/23/21 0304  WBC 4.2 5.0  NEUTROABS 2.7  --   HGB 14.5 14.2  HCT 44.4 43.5  MCV 91.4 89.1  PLT 211 177   Basic Metabolic Panel Recent Labs    40/98/1106/23/22 0800 04/23/21 0304  NA 136 137  K 4.3 3.8  CL 99 103  CO2 29 26  GLUCOSE 109* 123*  BUN 11 11  CREATININE 1.15* 1.12*  CALCIUM 9.2 8.6*   Liver Function Tests Recent Labs    04/22/21 0800  AST 19  ALT 16  ALKPHOS 86  BILITOT 0.4  PROT 7.2  ALBUMIN 4.2   No results for input(s): LIPASE, AMYLASE in the last 72 hours. High Sensitivity Troponin:   Recent Labs  Lab 03/25/21 0904 03/25/21 1154 04/22/21 0800 04/22/21 1547  TROPONINIHS 3 4 5 11     BNP Invalid input(s): POCBNP D-Dimer Recent Labs    04/22/21 1547  DDIMER 0.42   Hemoglobin A1C Recent Labs    04/22/21 0800  HGBA1C 5.2   Fasting Lipid Panel Recent Labs    04/22/21 0800  CHOL 230*  HDL 84  LDLCALC 133*  TRIG 65  CHOLHDL 2.7   Thyroid Function Tests No results for input(s): TSH, T4TOTAL, T3FREE, THYROIDAB in the last 72 hours.  Invalid input(s): FREET3 _____________  CARDIAC CATHETERIZATION  Result Date: 04/22/2021  Prox RCA to Mid RCA lesion is 30%  stenosed.  RPDA lesion is 30% stenosed.  1st Mrg lesion is 50% stenosed.  Mid LAD lesion is 50% stenosed.  The left ventricular systolic function is normal.  LV end diastolic pressure is normal.  The left ventricular ejection fraction is 55-65% by visual estimate.  There is no mitral valve regurgitation.  1. Moderate non-obstructive disease in the mid LAD 2. Mild non-obstructive disease in the Circumflex and dominant RCA 3. Normal LV systolic function 4. Normal LV filling pressure. Recommendations: No severe CAD noted on emergent cardiac cath. Will admit to telemetry. Echo later today. Will get one more troponin. Will check a d-dimer. Follow on telemetry. She does have recent history of high grade AV block.   DG Chest Port 1 View  Result  Date: 04/22/2021 CLINICAL DATA:  Lambert Mody LEFT-sided chest pain since yesterday, some shortness of breath EXAM: PORTABLE CHEST 1 VIEW COMPARISON:  Portable exam 0822 hours compared to 03/25/2021 FINDINGS: Upper normal heart size. Mediastinal contours and pulmonary vascularity normal. Lungs clear. Minimal chronic peribronchial thickening. No acute infiltrate, pleural effusion, or pneumothorax. Bones demineralized. IMPRESSION: Minimal chronic bronchitic changes without infiltrate. Electronically Signed   By: Ulyses Southward M.D.   On: 04/22/2021 08:59   DG Chest Port 1 View  Result Date: 03/25/2021 CLINICAL DATA:  Weakness. EXAM: PORTABLE CHEST 1 VIEW COMPARISON:  CT 03/19/2021.  Chest x-ray 03/19/2021. FINDINGS: Mediastinum hilar structures normal. Cardiomegaly. No pulmonary venous congestion. Lungs are clear. No pleural effusion or pneumothorax. Degenerative change thoracic spine. IMPRESSION: 1.  Cardiomegaly.  No pulmonary venous congestion. 2.  No acute pulmonary disease. Electronically Signed   By: Maisie Fus  Register   On: 03/25/2021 09:12   ECHOCARDIOGRAM COMPLETE  Result Date: 04/22/2021    ECHOCARDIOGRAM REPORT   Patient Name:   Kayla Terry Date of Exam: 04/22/2021  Medical Rec #:  [id number]      Height:       63.0 in Accession #:    [id number]     Weight:       120.0 lb Date of Birth:  May 25, 1946      BSA:          1.556 m Patient Age:    75 years       BP:           123/73 mmHg Patient Gender: F              HR:           44 bpm. 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 Risk Factors:Hypertension and Dyslipidemia.                 COVID 19. Chest tightness. Mobitz type 2 second degree                 atrioventricular block, pectus excavatum deformity.  Sonographer:    Leta Jungling RDCS Referring Phys: [id number] CHRISTOPHER D MCALHANY IMPRESSIONS  1. Left ventricular ejection fraction, by estimation, is 55 to 60%. The left ventricle has normal function. The left ventricle has no regional wall motion abnormalities. Left ventricular diastolic parameters are consistent with Grade I diastolic dysfunction (impaired relaxation).  2. Right ventricular systolic function is normal. The right ventricular size is normal.  3. The mitral valve is normal in structure. Mild mitral valve regurgitation. No evidence of mitral stenosis.  4. The aortic valve is tricuspid. Aortic valve regurgitation is not visualized. Mild aortic valve sclerosis is present, with no evidence of aortic valve stenosis.  5. The inferior vena cava is normal in size with greater than 50% respiratory variability, suggesting right atrial pressure of 3 mmHg. FINDINGS  Left Ventricle: Left ventricular ejection fraction, by estimation, is 55 to 60%. The left ventricle has normal function. The left ventricle has no regional wall motion abnormalities. Definity contrast agent was given IV to delineate the left ventricular  endocardial borders. The left ventricular internal cavity size was  normal in  size. There is no left ventricular hypertrophy. Abnormal (paradoxical) septal motion, consistent with left bundle branch block. Left ventricular diastolic parameters are consistent with Grade I diastolic dysfunction (impaired relaxation). Right Ventricle: The right ventricular size is normal. Right ventricular systolic function is normal. Left Atrium: Left atrial size was normal in size. Right Atrium: Right atrial size was normal in size. Pericardium: There is no evidence of pericardial effusion. Mitral Valve: The mitral valve is normal in structure. Mild mitral valve regurgitation. No evidence of mitral valve stenosis. Tricuspid Valve: The tricuspid valve is normal in structure. Tricuspid valve regurgitation is trivial. No evidence of tricuspid stenosis. Aortic Valve: The aortic valve is tricuspid. Aortic valve regurgitation is not visualized. Mild aortic valve sclerosis is present, with no evidence of aortic valve stenosis. Pulmonic Valve: The pulmonic valve was normal in structure. Pulmonic valve regurgitation is not visualized. No evidence of pulmonic stenosis. Aorta: The aortic root is normal in size and structure. Venous: The inferior vena cava is normal in size with greater than 50% respiratory variability, suggesting right atrial pressure of 3 mmHg. IAS/Shunts: No atrial level shunt detected by color flow Doppler.  LEFT VENTRICLE PLAX 2D LVIDd:         3.60 cm     Diastology LVIDs:         2.50 cm     LV e' medial:    4.56 cm/s LV PW:         0.80 cm     LV E/e' medial:  14.1 LV IVS:        0.80 cm     LV e' lateral:   6.42 cm/s LVOT diam:     1.60 cm     LV E/e' lateral: 10.0 LV SV:         29 LV SV Index:   18 LVOT Area:     2.01 cm  LV Volumes (MOD) LV vol d, MOD A2C: 54.4 ml LV vol d, MOD A4C: 88.6 ml LV vol s, MOD A2C: 23.8 ml LV vol s, MOD A4C: 42.7 ml LV SV MOD A2C:     30.6 ml LV SV MOD A4C:     88.6 ml LV SV MOD BP:      40.4 ml RIGHT VENTRICLE RV S prime:     9.75 cm/s TAPSE (M-mode): 1.1 cm LEFT  ATRIUM             Index       RIGHT ATRIUM          Index LA diam:        2.30 cm 1.48 cm/m  RA Area:     8.11 cm LA Vol (A2C):   23.1 ml 14.84 ml/m RA Volume:   12.20 ml 7.84 ml/m LA Vol (A4C):   14.2 ml 9.12 ml/m LA Biplane Vol: 19.8 ml 12.72 ml/m  AORTIC VALVE LVOT Vmax:   77.15 cm/s LVOT Vmean:  48.800 cm/s LVOT VTI:    0.142 m  AORTA Ao Root diam: 3.00 cm Ao Asc diam:  3.10 cm MITRAL VALVE MV Area (PHT): 3.27 cm    SHUNTS MV Decel Time: 232 msec    Systemic VTI:  0.14 m MV E velocity: 64.20 cm/s  Systemic Diam: 1.60 cm MV A velocity: 86.80 cm/s MV E/A ratio:  0.74 Olga Millers MD Electronically signed by Olga Millers MD Signature Date/Time: 04/22/2021/3:46:41 PM    Final    Disposition   Pt is being discharged home  today in good condition.  Follow-up Plans & Appointments     Follow-up Information     Runell Gess, MD Follow up on 05/21/2021.   Specialties: Cardiology, Radiology Why: 4:30 hospital follow up Contact information: [address] [place] [address] [phone]         [name], MD Follow up on 06/05/2021.   Specialty: Family Medicine Contact information: [address] [place] [address] [phone]         Runell Gess, MD .   Specialties: Cardiology, Radiology Contact information: [address] [place] [address] [phone]         Marinus Maw, MD .   Specialty: Cardiology Contact information: [address] [place] [address] [phone]                Discharge Instructions     Diet - low sodium heart healthy   Complete by: As directed    Diet - low sodium heart healthy   Complete by: As directed    Discharge instructions   Complete by: As directed    No driving for 2 days. No lifting over 5 lbs for 1 week. No sexual activity for 1 week. Keep procedure site clean & dry. If you notice increased pain, swelling, bleeding or pus, call/return!  You may shower, but  no soaking baths/hot tubs/pools for 1 week.   Discharge instructions   Complete by: As directed    No driving for 2 days. No lifting over 5 lbs for 1 week. No sexual activity for 1 week. Keep procedure site clean & dry. If you notice increased pain, swelling, bleeding or pus, call/return!  You may shower, but no soaking baths/hot tubs/pools for 1 week.   For conservative care for COVID-19 infection: **vitamins: zinc /day, quercetin  twice daily, vitamin D3 5000IU/day, vitamin C  twice dailyt x 5-10 days **recommended that you monitor your oxygen with goal sats >93-94% **can do over the counter cough medication e.g. mucinex, robitussin DM and also recommend honey as evidence based medicine shows it helps improve coughing. **cool mist humidifier at night ** sleep on your stomach as much as possible **recommend you see your PCP if worsening symptoms or return to ED if oxygen < 90% or any worsening shortness of breath.   Increase activity slowly   Complete by: As directed    Increase activity slowly   Complete by: As directed        Discharge Medications   Allergies as of 04/23/2021       Reactions   Escitalopram Nausea Only, Other (See Comments)   Nausea and dizziness   Pantoprazole Other (See Comments)   Severe headaches   Naproxen Sodium Anxiety        Medication List     TAKE these medications    aspirin 81 MG EC tablet Take 1 tablet (81 mg total) by mouth daily.   atorvastatin 80 MG tablet Commonly known as: Lipitor Take 1 tablet (80 mg total) by mouth daily. Start taking on: May 01, 2021   Centrum Silver 50+Women Tabs Take 1 tablet by mouth daily with breakfast.   famotidine 20 MG tablet Commonly known as: PEPCID Take 20 mg by mouth See admin instructions. Take 20 mg by mouth in the morning before breakfast and an additional 20 mg once a day as needed for heartburn   fluticasone 110 MCG/ACT inhaler Commonly known as: Flovent HFA Inhale 1  puff  into the lungs 2 (two) times daily.   gabapentin 100 MG capsule Commonly known as: NEURONTIN Take 100-200 mg by mouth See admin instructions. Take 200 mg by mouth daily at 3 PM and an additional 100 mg once a day as needed for restless legs   guaiFENesin 600 MG 12 hr tablet Commonly known as: MUCINEX Take 2 tablets (1,200 mg total) by mouth 2 (two) times daily for 10 days.   LORazepam 0.5 MG tablet Commonly known as: ATIVAN Take 0.5 mg by mouth 2 (two) times daily as needed for anxiety.   Nirmatrelvir & Ritonavir 20 x 150 MG & 10 x 100MG  Tbpk Take 2 tablets by mouth 2 (two) times daily for 5 days.   SYSTANE PRESERVATIVE FREE OP 1 drop See admin instructions. Instill 1 drop into the affected eye once a day as needed for irritation   vitamin B-12 500 MCG tablet Commonly known as: CYANOCOBALAMIN Take 500 mcg by mouth daily.   zolpidem 10 MG tablet Commonly known as: AMBIEN Take 10 mg by mouth every evening.           Outstanding Labs/Studies   Lipids in 6 weeks  Duration of Discharge Encounter   Greater than 30 minutes including physician time.  Signed, Khloe Hunkele, PA 04/23/2021, 3:25 PM

## 2021-04-23 NOTE — Progress Notes (Signed)
Initial Nutrition Assessment  DOCUMENTATION CODES:   Not applicable  INTERVENTION:   -Increase Ensure Enlive po to TID, each supplement provides 350 kcal and 20 grams of protein  -MVI with minerals daily  NUTRITION DIAGNOSIS:   Increased nutrient needs related to acute illness (COVID-19) as evidenced by estimated needs.  GOAL:   Patient will meet greater than or equal to 90% of their needs  MONITOR:   PO intake, Supplement acceptance, Labs, Weight trends, Skin, I & O's  REASON FOR ASSESSMENT:   Malnutrition Screening Tool    ASSESSMENT:   Kayla Terry is a 75 yo female with history of GERD, HTN, tachy-brady syndrome who presented to the [facility] ED this am with c/o chest pain since 10 pm last night. Her pain had resolved on arrival to the ED around 8 am but recurred in the ED. First troponin is 5. EKG with 1 mm inferior ST elevation. Code STEMI called by ED staff after discussion with Dr. Garnette Scheuermann. No history of CAD. Normal stress test in June 2021. Echo 03/19/21 with LVEF=60-65%. Mild LVH. No significant valve disease.  Pt admitted with chest pain.   Reviewed I/O's: +615 ml x 24 hours  Case discussed with RN, who reports pt is drinking Ensure well, but not eating much solid foods secondary to sore throat.   Spoke with pt at bedside, who reports feeling better today. She shares that she ate a little bit of breakfast, but reports she is not eating much solid foods due to having a sore throat. She enjoys the Ensure supplements and has been drinking them.  PTA, pt consumed 3 meals per day (Breakfast: oatmeal eggs, toast, and juice; Lunch: half of a meat, starch, and vegetable dinner; Dinner: second half of meat, starch, and vegetable dinner). Pt reports she also was drinking Ensure Original supplements a few times per week.   Pt reports her UBW is around 150#. She endorses a 20 pound weight loss over the past year, but unsure why. She shares her weight has stabilized, but her  clothing remains looser. Reviewed wt hx; pt has experienced a 4% wt loss over the past 3 months, which is not significant for time frame.   Discussed importance of good meal and supplement intake to promote healing. Pt amenable to continue Ensure Enlive supplements.   Labs reviewed.   NUTRITION - FOCUSED PHYSICAL EXAM:  Flowsheet Row Most Recent Value  Orbital Region No depletion  Upper Arm Region No depletion  Thoracic and Lumbar Region No depletion  Buccal Region No depletion  Temple Region No depletion  Clavicle Bone Region No depletion  Clavicle and Acromion Bone Region No depletion  Scapular Bone Region No depletion  Dorsal Hand No depletion  Patellar Region No depletion  Anterior Thigh Region No depletion  Posterior Calf Region No depletion  Edema (RD Assessment) None  Hair Reviewed  Eyes Reviewed  Mouth Reviewed  Skin Reviewed  Nails Reviewed       Diet Order:   Diet Order             Diet - low sodium heart healthy           Diet - low sodium heart healthy           Diet Heart Room service appropriate? Yes; Fluid consistency: Thin  Diet effective now                   EDUCATION NEEDS:   Education needs have been  addressed  Skin:  Skin Assessment: Reviewed RN Assessment  Last BM:  Unknown  Height:   Ht Readings from Last 1 Encounters:  04/22/21 5\' 3"  (1.6 m)    Weight:   Wt Readings from Last 1 Encounters:  04/23/21 58.2 kg    Ideal Body Weight:  52.3 kg  BMI:  Body mass index is 22.71 kg/m.  Estimated Nutritional Needs:   Kcal:  1850-2050  Protein:  100-115 grams  Fluid:  > 1.8 L    04/25/21, RD, LDN, CDCES Registered Dietitian II Certified Diabetes Care and Education Specialist Please refer to [facility] for RD and/or RD on-call/weekend/after hours pager

## 2021-04-23 NOTE — Consult Note (Signed)
Medical Consultation   Kayla Terry  WJX:[id number]  DOB: 26-May-1946  DOA: 04/22/2021  PCP: Christain Sacramento, MD   Outpatient Specialists: Dr. Gwenlyn Found: cardiology and Dr. Larae Grooms: EP    Requesting physician: Dr. Claiborne Billings with cardiology   Reason for consultation: Covid 19 infection    History of Present Illness: Kayla Terry is an 75 y.o. female with history of HTN, tachy brady syndrome, GERD who was admitted to [facility] under cardiology for chest pain. Underwent left heart cath on 04/22/21 with moderate non obstructive disease in the mid LAD and mild non-obstructive disease in the circumflex and dominant RCA. She was admitted to tele, echo was ordered and troponins trended.  She incidentally tested positive for covid with symptom onset yesterday (04/22/21) and we were asked to consult. She states she started to have a sore throat yesterday and her cough started today. It is productive in nature and she will spit up yellow mucous. She denies any shortness of breath, chest pain, loss of smell or taste, headache and chest pain has resolved. She is unvaccinated for Covid. She states one of her family members had a cough that she was around, but no known covid exposure.    Review of Systems:  Review of Systems  Constitutional:  Negative for chills, diaphoresis and fever.  HENT:  Positive for sore throat. Negative for congestion and sinus pain.   Respiratory:  Positive for cough and sputum production. Negative for shortness of breath and wheezing.   Cardiovascular:  Negative for chest pain.  Gastrointestinal:  Negative for abdominal pain, nausea and vomiting.  Musculoskeletal:  Negative for myalgias.  Neurological:  Negative for headaches.      Past Medical History: Past Medical History:  Diagnosis Date   Allergy    B12 deficiency 04/2020   Cataract    Fatty liver    GERD (gastroesophageal reflux disease)    Herpes zoster without complications    Hiatal hernia    Hypertension     Mild mitral regurgitation 2013   Mobitz type 2 second degree atrioventricular block    Pectus excavatum    pectus excavatum deformity resulting in mass effect on the RV by CT 03/2020   Pulmonary nodule    Shingles     Past Surgical History: Past Surgical History:  Procedure Laterality Date   ABDOMINAL HYSTERECTOMY  02/1984   BACK SURGERY     CATARACT EXTRACTION, BILATERAL     LEFT HEART CATH AND CORONARY ANGIOGRAPHY N/A 04/22/2021   Procedure: LEFT HEART CATH AND CORONARY ANGIOGRAPHY;  Surgeon: Burnell Blanks, MD;  Location: [place] [facility];  Service: Cardiovascular;  Laterality: N/A;   LUMBAR LAMINECTOMY     OOPHORECTOMY  02/29/1984   unilateral     Allergies:   Allergies  Allergen Reactions   Escitalopram Nausea Only and Other (See Comments)    Nausea and dizziness   Pantoprazole Other (See Comments)    Severe headaches    Naproxen Sodium Anxiety     Social History:  reports that she has never smoked. She has never used smokeless tobacco. She reports that she does not drink alcohol and does not use drugs.   Family History: Family History  Problem Relation Age of Onset   Cancer Mother    Leukemia Mother    Lung cancer Father    Multiple myeloma Father    Multiple sclerosis Sister    Alcohol abuse  Brother    Multiple myeloma Brother    Colon cancer Neg Hx    Colon polyps Neg Hx    Stomach cancer Neg Hx    Esophageal cancer Neg Hx       Physical Exam: Vitals:   04/23/21 0042 04/23/21 0400 04/23/21 0801 04/23/21 0900  BP: 138/85 124/62  (!) 150/75  Pulse: (!) 48 (!) 45  (!) 49  Resp: 16 (!) '25 18 13  ' Temp: 98.3 F (36.8 C) 99.6 F (37.6 C) 97.9 F (36.6 C)   TempSrc: Oral Oral Oral   SpO2: 95% 90%  94%  Weight:  58.2 kg    Height:        Constitutional:   Alert and awake, oriented x3, not in any acute distress. Eyes: PERLA, EOMI, irises appear normal, anicteric sclera,  ENMT: external ears and nose appear normal, normal hearing              Lips appears normal, oropharynx mucosa, tongue, posterior pharynx appear normal  Neck: neck appears normal, no masses, normal ROM, no thyromegaly, no JVD  CVS: S1-S2 clear, no murmur rubs or gallops, no LE edema, normal pedal pulses  Respiratory:  faint crackles right LL, otherwise clear with good air movement, no wheezing, rales or rhonchi. Respiratory effort normal. No accessory muscle use.  Abdomen: soft nontender, nondistended, normal bowel sounds, no hepatosplenomegaly, no hernias  Musculoskeletal: : no cyanosis, clubbing or edema noted bilaterally                        Neuro: Cranial nerves II-XII intact, strength, sensation, reflexes Psych: judgement and insight appear normal, stable mood and affect, mental status Skin: no rashes or lesions or ulcers, no induration or nodules    Data reviewed:  I have personally reviewed following labs and imaging studies Labs:  CBC: Recent Labs  Lab 04/22/21 0800 04/23/21 0304  WBC 4.2 5.0  NEUTROABS 2.7  --   HGB 14.5 14.2  HCT 44.4 43.5  MCV 91.4 89.1  PLT 211 893    Basic Metabolic Panel: Recent Labs  Lab 04/22/21 0800 04/23/21 0304  NA 136 137  K 4.3 3.8  CL 99 103  CO2 29 26  GLUCOSE 109* 123*  BUN 11 11  CREATININE 1.15* 1.12*  CALCIUM 9.2 8.6*   GFR Estimated Creatinine Clearance: 35.9 mL/min (A) (by C-G formula based on SCr of 1.12 mg/dL (H)). Liver Function Tests: Recent Labs  Lab 04/22/21 0800  AST 19  ALT 16  ALKPHOS 86  BILITOT 0.4  PROT 7.2  ALBUMIN 4.2   No results for input(s): LIPASE, AMYLASE in the last 168 hours. No results for input(s): AMMONIA in the last 168 hours. Coagulation profile Recent Labs  Lab 04/22/21 0800  INR 0.9    Cardiac Enzymes: No results for input(s): CKTOTAL, CKMB, CKMBINDEX, TROPONINI in the last 168 hours. BNP: Invalid input(s): POCBNP CBG: No results for input(s): GLUCAP in the last 168 hours. D-Dimer Recent Labs    04/22/21 1547  DDIMER 0.42   Hgb  A1c Recent Labs    04/22/21 0800  HGBA1C 5.2   Lipid Profile Recent Labs    04/22/21 0800  CHOL 230*  HDL 84  LDLCALC 133*  TRIG 65  CHOLHDL 2.7   Thyroid function studies No results for input(s): TSH, T4TOTAL, T3FREE, THYROIDAB in the last 72 hours.  Invalid input(s): FREET3 Anemia work up No results for input(s): VITAMINB12, FOLATE, FERRITIN, TIBC, IRON,  RETICCTPCT in the last 72 hours. Urinalysis    Component Value Date/Time   COLORURINE STRAW (A) 03/25/2021 0850   APPEARANCEUR CLEAR 03/25/2021 0850   LABSPEC 1.003 (L) 03/25/2021 0850   PHURINE 7.0 03/25/2021 0850   GLUCOSEU NEGATIVE 03/25/2021 0850   HGBUR NEGATIVE 03/25/2021 0850   BILIRUBINUR NEGATIVE 03/25/2021 0850   KETONESUR NEGATIVE 03/25/2021 0850   PROTEINUR NEGATIVE 03/25/2021 0850   UROBILINOGEN 0.2 01/29/2012 0938   NITRITE NEGATIVE 03/25/2021 0850   LEUKOCYTESUR NEGATIVE 03/25/2021 0850     Sepsis Labs Invalid input(s): PROCALCITONIN,  WBC,  LACTICIDVEN Microbiology Recent Results (from the past 240 hour(s))  Resp Panel by RT-PCR (Flu A&B, Covid) Nasopharyngeal Swab     Status: Abnormal   Collection Time: 04/22/21  8:12 AM   Specimen: Nasopharyngeal Swab; Nasopharyngeal(NP) swabs in vial transport medium  Result Value Ref Range Status   SARS Coronavirus 2 by RT PCR POSITIVE (A) NEGATIVE Final    Comment: RESULT CALLED TO, READ BACK BY AND VERIFIED WITH: OAKLEY,B AT 1155 BY HUFFINES,S ON 04/22/21. (NOTE) SARS-CoV-2 target nucleic acids are DETECTED.  The SARS-CoV-2 RNA is generally detectable in upper respiratory specimens during the acute phase of infection. Positive results are indicative of the presence of the identified virus, but do not rule out bacterial infection or co-infection with other pathogens not detected by the test. Clinical correlation with patient history and other diagnostic information is necessary to determine patient infection status. The expected result is  Negative.  Fact Sheet for Patients: EntrepreneurPulse.com.au  Fact Sheet for Healthcare Providers: IncredibleEmployment.be  This test is not yet approved or cleared by the [place] FDA and  has been authorized for detection and/or diagnosis of SARS-CoV-2 by FDA under an Emergency Use Authorization (EUA).  This EUA will remain in effect (meaning this t est can be used) for the duration of  the COVID-19 declaration under Section 564(b)(1) of the Act, 21 U.S.C. section 360bbb-3(b)(1), unless the authorization is terminated or revoked sooner.     Influenza A by PCR NEGATIVE NEGATIVE Final   Influenza B by PCR NEGATIVE NEGATIVE Final    Comment: (NOTE) The Xpert Xpress SARS-CoV-2/FLU/RSV plus assay is intended as an aid in the diagnosis of influenza from Nasopharyngeal swab specimens and should not be used as a sole basis for treatment. Nasal washings and aspirates are unacceptable for Xpert Xpress SARS-CoV-2/FLU/RSV testing.  Fact Sheet for Patients: EntrepreneurPulse.com.au  Fact Sheet for Healthcare Providers: IncredibleEmployment.be  This test is not yet approved or cleared by the [place] FDA and has been authorized for detection and/or diagnosis of SARS-CoV-2 by FDA under an Emergency Use Authorization (EUA). This EUA will remain in effect (meaning this test can be used) for the duration of the COVID-19 declaration under Section 564(b)(1) of the Act, 21 U.S.C. section 360bbb-3(b)(1), unless the authorization is terminated or revoked.  Performed at [facility], [address], [place], [place] [address]   Group A Strep by PCR     Status: None   Collection Time: 04/22/21  8:12 AM   Specimen: Nasopharyngeal Swab; Sterile Swab  Result Value Ref Range Status   Group A Strep by PCR NOT DETECTED NOT DETECTED Final    Comment: Performed at [facility], [address] [place], [place] [address]        Inpatient Medications:   Scheduled Meds:  aspirin  81 mg Oral Daily   enoxaparin (LOVENOX) injection  40 mg Subcutaneous Q24H   feeding supplement  237 mL Oral  BID BM   guaiFENesin  600 mg Oral BID   nirmatrelvir/ritonavir EUA (renal dosing)  2 tablet Oral BID   sodium chloride flush  3 mL Intravenous Q12H   Continuous Infusions:  sodium chloride     sodium chloride       Radiological Exams on Admission: CARDIAC CATHETERIZATION  Result Date: 04/22/2021  Prox RCA to Mid RCA lesion is 30% stenosed.  RPDA lesion is 30% stenosed.  1st Mrg lesion is 50% stenosed.  Mid LAD lesion is 50% stenosed.  The left ventricular systolic function is normal.  LV end diastolic pressure is normal.  The left ventricular ejection fraction is 55-65% by visual estimate.  There is no mitral valve regurgitation.  1. Moderate non-obstructive disease in the mid LAD 2. Mild non-obstructive disease in the Circumflex and dominant RCA 3. Normal LV systolic function 4. Normal LV filling pressure. Recommendations: No severe CAD noted on emergent cardiac cath. Will admit to telemetry. Echo later today. Will get one more troponin. Will check a d-dimer. Follow on telemetry. She does have recent history of high grade AV block.   DG Chest Port 1 View  Result Date: 04/22/2021 CLINICAL DATA:  Hervey Ard LEFT-sided chest pain since yesterday, some shortness of breath EXAM: PORTABLE CHEST 1 VIEW COMPARISON:  Portable exam 0822 hours compared to 03/25/2021 FINDINGS: Upper normal heart size. Mediastinal contours and pulmonary vascularity normal. Lungs clear. Minimal chronic peribronchial thickening. No acute infiltrate, pleural effusion, or pneumothorax. Bones demineralized. IMPRESSION: Minimal chronic bronchitic changes without infiltrate. Electronically Signed   By: Lavonia Dana M.D.   On: 04/22/2021 08:59   ECHOCARDIOGRAM COMPLETE  Result Date: 04/22/2021    ECHOCARDIOGRAM REPORT   Patient Name:   Kayla Terry Date  of Exam: 04/22/2021 Medical Rec #:  [id number]      Height:       63.0 in Accession #:    [id number]     Weight:       120.0 lb Date of Birth:  [birth year]      BSA:          1.556 m Patient Age:    88 years       BP:           123/73 mmHg Patient Gender: F              HR:           44 bpm. 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 Risk Factors:Hypertension and Dyslipidemia.                 COVID 19. Chest tightness. Mobitz type 2 second degree                 atrioventricular block, pectus excavatum deformity.  Sonographer:    Darlina Sicilian RDCS Referring Phys: [place]  1. Left ventricular ejection fraction, by estimation, is 55 to 60%. The left ventricle has normal function. The left ventricle has no regional wall motion abnormalities. Left ventricular diastolic parameters are consistent with Grade I diastolic dysfunction (impaired relaxation).  2. Right ventricular systolic function is normal. The right  ventricular size is normal.  3. The mitral valve is normal in structure. Mild mitral valve regurgitation. No evidence of mitral stenosis.  4. The aortic valve is tricuspid. Aortic valve regurgitation is not visualized. Mild aortic valve sclerosis is present, with no evidence of aortic valve stenosis.  5. The inferior vena cava is normal in size with greater than 50% respiratory variability, suggesting right atrial pressure of 3 mmHg. FINDINGS  Left Ventricle: Left ventricular ejection fraction, by estimation, is 55 to 60%. The left ventricle has normal function. The left ventricle has no regional wall motion abnormalities. Definity contrast agent was given IV to delineate the left ventricular  endocardial borders. The left ventricular internal cavity  size was normal in size. There is no left ventricular hypertrophy. Abnormal (paradoxical) septal motion, consistent with left bundle branch block. Left ventricular diastolic parameters are consistent with Grade I diastolic dysfunction (impaired relaxation). Right Ventricle: The right ventricular size is normal. Right ventricular systolic function is normal. Left Atrium: Left atrial size was normal in size. Right Atrium: Right atrial size was normal in size. Pericardium: There is no evidence of pericardial effusion. Mitral Valve: The mitral valve is normal in structure. Mild mitral valve regurgitation. No evidence of mitral valve stenosis. Tricuspid Valve: The tricuspid valve is normal in structure. Tricuspid valve regurgitation is trivial. No evidence of tricuspid stenosis. Aortic Valve: The aortic valve is tricuspid. Aortic valve regurgitation is not visualized. Mild aortic valve sclerosis is present, with no evidence of aortic valve stenosis. Pulmonic Valve: The pulmonic valve was normal in structure. Pulmonic valve regurgitation is not visualized. No evidence of pulmonic stenosis. Aorta: The aortic root is normal in size and structure. Venous: The inferior vena cava is normal in size with greater than 50% respiratory variability, suggesting right atrial pressure of 3 mmHg. IAS/Shunts: No atrial level shunt detected by color flow Doppler.  LEFT VENTRICLE PLAX 2D LVIDd:         3.60 cm     Diastology LVIDs:         2.50 cm     LV e' medial:    4.56 cm/s LV PW:         0.80 cm     LV E/e' medial:  14.1 LV IVS:        0.80 cm     LV e' lateral:   6.42 cm/s LVOT diam:     1.60 cm     LV E/e' lateral: 10.0 LV SV:         29 LV SV Index:   18 LVOT Area:     2.01 cm  LV Volumes (MOD) LV vol d, MOD A2C: 54.4 ml LV vol d, MOD A4C: 88.6 ml LV vol s, MOD A2C: 23.8 ml LV vol s, MOD A4C: 42.7 ml LV SV MOD A2C:     30.6 ml LV SV MOD A4C:     88.6 ml LV SV MOD BP:      40.4 ml RIGHT VENTRICLE RV S prime:     9.75 cm/s TAPSE  (M-mode): 1.1 cm LEFT ATRIUM             Index       RIGHT ATRIUM          Index LA diam:        2.30 cm 1.48 cm/m  RA Area:     8.11 cm LA Vol (A2C):   23.1 ml 14.84 ml/m RA Volume:   12.20 ml 7.84 ml/m LA  Vol (A4C):   14.2 ml 9.12 ml/m LA Biplane Vol: 19.8 ml 12.72 ml/m  AORTIC VALVE LVOT Vmax:   77.15 cm/s LVOT Vmean:  48.800 cm/s LVOT VTI:    0.142 m  AORTA Ao Root diam: 3.00 cm Ao Asc diam:  3.10 cm MITRAL VALVE MV Area (PHT): 3.27 cm    SHUNTS MV Decel Time: 232 msec    Systemic VTI:  0.14 m MV E velocity: 64.20 cm/s  Systemic Diam: 1.60 cm MV A velocity: 86.80 cm/s MV E/A ratio:  0.74 Kirk Ruths MD Electronically signed by Kirk Ruths MD Signature Date/Time: 04/22/2021/3:46:41 PM    Final     Impression/Recommendations Active Problems:  COVID-19 virus infection -recommend paxlovid x 5 days as she qualifies with symptom onset < 5 days, age, unvaccinated, HTN. I called pharmacy who checked renal function and drug interaction and states she is a candidate for this drug. Can get first dose in hospital and then pick up remaining at pharmacy if discharged.  -she is satting 97% on room air and with ambulation.  She is not short of breath or in any respiratory distress. CXR clear yesterday.  -recommend pulmicort inhaler if needed for chest tightness, coughing, wheezing BID -vitamins: zinc 171m/day, quercetin 2556mBID, vitamin D3 5000IU/day, vitamin C 10002mD x 5-10 days -she has a pulse ox at home and recommended she monitor her oxygen with goal sats >93-94% -can do over the counter cough medication e.g. mucinex, robitussin DM and also recommend honey as evidence based medicine shows it helps improve coughing.  -cool mist humidifier at night -prone sleeping -recommend she see her PCP if worsening symptoms or return to ED if oxygen < 90% or any worsening shortness of breath.     Chest pain of uncertain etiology  Per cardiology recommendation.   HTN Per cardiology recommendation.    Thank you for this consultation.    Time Spent: 45 43nutes   AllOrma FlamingD. [facility] 04/23/2021, 2:50 PM

## 2021-04-23 NOTE — Progress Notes (Signed)
Patient is alert and oriented with pain and anxiety, Blood pressures elevated gave aspirin and tylenol. Refused Lipitor.

## 2021-05-21 ENCOUNTER — Ambulatory Visit: Payer: Medicare Other | Admitting: Cardiovascular Disease

## 2021-05-21 ENCOUNTER — Telehealth: Payer: Self-pay | Admitting: Cardiovascular Disease

## 2021-05-21 NOTE — Telephone Encounter (Signed)
Patient called to see if Dr. Allyson Sabal would look back over her chart info from when she was in the hospital on June 23. Patient stated that the drs told her then that she needs to get a pacemaker. Patient stated the last time she saw dr berry he didn't mention anything bout it. Patient just want dr berry opinion on everything. And if she needs one will he be the one to put it in. Please advise

## 2021-05-21 NOTE — Telephone Encounter (Signed)
Pt calling today to determine if it is recommended for her to have a PPM implanted. We discussed her last OV with Dr. Ladona Ridgel and he did recommend she have a PPM implant. She has decided this would be a good option for her and would like to know available procedure dates.  I will forward to Dr. Lubertha Basque RN for review and follow up with pt.

## 2021-05-24 ENCOUNTER — Telehealth: Payer: Self-pay | Admitting: Internal Medicine

## 2021-05-24 NOTE — Telephone Encounter (Signed)
Patient made mention that she do not want to get the procedure done yet because of the fact of her not stealing feeling well from having COVID-19. Said that she stealing have lingering affects of it.

## 2021-05-24 NOTE — Telephone Encounter (Signed)
Returned call to Pt.  Per Pt she has a lingering cough and would like to wait to schedule pacemaker until she feels better.  Advised to call whenever she felt ready to schedule.

## 2021-05-24 NOTE — Telephone Encounter (Signed)
Left detailed message for Pt requesting call back to schedule pacemaker with Dr. Ladona Ridgel.

## 2021-07-12 ENCOUNTER — Other Ambulatory Visit: Payer: Self-pay

## 2021-07-12 ENCOUNTER — Emergency Department (HOSPITAL_COMMUNITY)
Admission: EM | Admit: 2021-07-12 | Discharge: 2021-07-12 | Disposition: A | Discharge status: Home / Self Care | Payer: Medicare Other | Attending: Emergency Medicine | Admitting: Emergency Medicine

## 2021-07-12 ENCOUNTER — Encounter (HOSPITAL_COMMUNITY): Payer: Self-pay | Admitting: Emergency Medicine

## 2021-07-12 DIAGNOSIS — Z5321 Procedure and treatment not carried out due to patient leaving prior to being seen by health care provider: Secondary | ICD-10-CM | POA: Diagnosis not present

## 2021-07-12 DIAGNOSIS — M549 Dorsalgia, unspecified: Secondary | ICD-10-CM | POA: Diagnosis not present
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
## 2021-08-05 ENCOUNTER — Other Ambulatory Visit: Payer: Self-pay

## 2021-08-05 ENCOUNTER — Emergency Department (HOSPITAL_COMMUNITY)
Admission: EM | Admit: 2021-08-05 | Discharge: 2021-08-05 | Disposition: A | Discharge status: Home / Self Care | Payer: Medicare Other | Attending: Emergency Medicine | Admitting: Emergency Medicine

## 2021-08-05 ENCOUNTER — Emergency Department (HOSPITAL_COMMUNITY): Payer: Medicare Other

## 2021-08-05 ENCOUNTER — Encounter (HOSPITAL_COMMUNITY): Payer: Self-pay | Admitting: *Deleted

## 2021-08-05 DIAGNOSIS — I10 Essential (primary) hypertension: Secondary | ICD-10-CM | POA: Insufficient documentation

## 2021-08-05 DIAGNOSIS — R030 Elevated blood-pressure reading, without diagnosis of hypertension: Secondary | ICD-10-CM | POA: Diagnosis present

## 2021-08-05 DIAGNOSIS — Z955 Presence of coronary angioplasty implant and graft: Secondary | ICD-10-CM | POA: Insufficient documentation

## 2021-08-05 DIAGNOSIS — I2510 Atherosclerotic heart disease of native coronary artery without angina pectoris: Secondary | ICD-10-CM | POA: Diagnosis not present

## 2021-08-05 DIAGNOSIS — R109 Unspecified abdominal pain: Secondary | ICD-10-CM | POA: Insufficient documentation

## 2021-08-05 DIAGNOSIS — Z79899 Other long term (current) drug therapy: Secondary | ICD-10-CM | POA: Diagnosis not present

## 2021-08-05 DIAGNOSIS — I160 Hypertensive urgency: Secondary | ICD-10-CM | POA: Diagnosis not present

## 2021-08-05 DIAGNOSIS — R110 Nausea: Secondary | ICD-10-CM | POA: Insufficient documentation

## 2021-08-05 DIAGNOSIS — Z8616 Personal history of COVID-19: Secondary | ICD-10-CM | POA: Insufficient documentation

## 2021-08-05 DIAGNOSIS — Z7982 Long term (current) use of aspirin: Secondary | ICD-10-CM | POA: Diagnosis not present

## 2021-08-05 LAB — CBC WITH DIFFERENTIAL/PLATELET
Abs Immature Granulocytes: 0.01 10*3/uL (ref 0.00–0.07)
Basophils Absolute: 0 10*3/uL (ref 0.0–0.1)
Basophils Relative: 1 %
Eosinophils Absolute: 0.1 10*3/uL (ref 0.0–0.5)
Eosinophils Relative: 1 %
HCT: 45.1 % (ref 36.0–46.0)
Hemoglobin: 15.3 g/dL — ABNORMAL HIGH (ref 12.0–15.0)
Immature Granulocytes: 0 %
Lymphocytes Relative: 14 %
Lymphs Abs: 0.9 10*3/uL (ref 0.7–4.0)
MCH: 30.8 pg (ref 26.0–34.0)
MCHC: 33.9 g/dL (ref 30.0–36.0)
MCV: 90.9 fL (ref 80.0–100.0)
Monocytes Absolute: 0.6 10*3/uL (ref 0.1–1.0)
Monocytes Relative: 9 %
Neutro Abs: 4.9 10*3/uL (ref 1.7–7.7)
Neutrophils Relative %: 75 %
Platelets: 268 10*3/uL (ref 150–400)
RBC: 4.96 MIL/uL (ref 3.87–5.11)
RDW: 13.4 % (ref 11.5–15.5)
WBC: 6.5 10*3/uL (ref 4.0–10.5)
nRBC: 0 % (ref 0.0–0.2)

## 2021-08-05 LAB — COMPREHENSIVE METABOLIC PANEL
ALT: 27 U/L (ref 0–44)
AST: 27 U/L (ref 15–41)
Albumin: 4.9 g/dL (ref 3.5–5.0)
Alkaline Phosphatase: 79 U/L (ref 38–126)
Anion gap: 10 (ref 5–15)
BUN: 10 mg/dL (ref 8–23)
CO2: 27 mmol/L (ref 22–32)
Calcium: 9.6 mg/dL (ref 8.9–10.3)
Chloride: 99 mmol/L (ref 98–111)
Creatinine, Ser: 0.83 mg/dL (ref 0.44–1.00)
GFR, Estimated: >60 mL/min (ref >60)
Glucose, Bld: 103 mg/dL — ABNORMAL HIGH (ref 70–99)
Potassium: 3.5 mmol/L (ref 3.5–5.1)
Sodium: 136 mmol/L (ref 135–145)
Total Bilirubin: 0.8 mg/dL (ref 0.3–1.2)
Total Protein: 8.1 g/dL (ref 6.5–8.1)

## 2021-08-05 LAB — URINALYSIS, ROUTINE W REFLEX MICROSCOPIC
Bilirubin Urine: NEGATIVE
Glucose, UA: NEGATIVE mg/dL
Hgb urine dipstick: NEGATIVE
Ketones, ur: NEGATIVE mg/dL
Leukocytes,Ua: NEGATIVE
Nitrite: NEGATIVE
Protein, ur: NEGATIVE mg/dL
Specific Gravity, Urine: 1.002 — ABNORMAL LOW (ref 1.005–1.030)
pH: 7 (ref 5.0–8.0)

## 2021-08-05 LAB — ETHANOL: Alcohol, Ethyl (B): <10 mg/dL (ref <10)

## 2021-08-05 LAB — D-DIMER, QUANTITATIVE: D-Dimer, Quant: 0.42 ug/mL-FEU (ref 0.00–0.50)

## 2021-08-05 LAB — TROPONIN I (HIGH SENSITIVITY)
Troponin I (High Sensitivity): 4 ng/L (ref <18)
Troponin I (High Sensitivity): 5 ng/L (ref <18)

## 2021-08-05 LAB — BRAIN NATRIURETIC PEPTIDE: B Natriuretic Peptide: 49 pg/mL (ref 0.0–100.0)

## 2021-08-05 MED ORDER — LORAZEPAM 2 MG/ML IJ SOLN: 0.5000 mg | Freq: Once | INTRAMUSCULAR | Status: DC

## 2021-08-05 MED ORDER — HYDRALAZINE HCL 20 MG/ML IJ SOLN
10.0000 mg | INTRAMUSCULAR | Status: AC
  Administered 2021-08-05: 10 mg via INTRAVENOUS
  Filled 2021-08-05: qty 1

## 2021-08-05 MED ORDER — IOHEXOL 300 MG/ML  SOLN
100.0000 mL | Freq: Once | INTRAMUSCULAR | Status: AC | PRN
  Administered 2021-08-05: 100 mL via INTRAVENOUS

## 2021-08-05 MED ORDER — LORAZEPAM 2 MG/ML IJ SOLN
1.0000 mg | Freq: Once | INTRAMUSCULAR | Status: AC
  Administered 2021-08-05: 1 mg via INTRAVENOUS
  Filled 2021-08-05: qty 1

## 2021-08-05 NOTE — ED Provider Notes (Signed)
[facility] EMERGENCY DEPARTMENT Provider Note   CSN: [id number] Arrival date & time: 08/05/21  6226     History Chief Complaint  Patient presents with   Hypertension    Kayla Terry is a 75 y.o. female.  HPI Elderly female presents with concern of nausea, dyspnea.  She also has consideration of hypertension. She takes no antihypertensives regularly, notes that she has had intermittent episodes of hypertension in the past, however. Since yesterday patient has had nausea, dyspnea, feeling" yucky" in general.  No focal pain that is new.  She does have intermittent low back pain since an accident that occurred a few months ago. There may be some new dysuria, but no nausea, vomiting, diarrhea, fever, chest pain. No clear precipitating, leaving, exacerbating factors.  During this illness she has taken her blood pressure multiple times and found elevated results.    Past Medical History:  Diagnosis Date   Allergy    B12 deficiency 04/2020   Cataract    Coronary artery disease    COVID-19 04/22/2021   Fatty liver    GERD (gastroesophageal reflux disease)    Herpes zoster without complications    Hiatal hernia    Hypertension    Mild mitral regurgitation 2013   Mobitz type 2 second degree atrioventricular block    Pectus excavatum    pectus excavatum deformity resulting in mass effect on the RV by CT 03/2020   Pulmonary nodule    Shingles     Patient Active Problem List   Diagnosis Date Noted   COVID-19 virus infection 04/23/2021   Coronary artery disease 04/23/2021   Chest pain 04/22/2021   Chest pain of uncertain etiology    Nonspecific abnormal electrocardiogram (ECG) (EKG)    2nd degree AV block 03/25/2021   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   B12 deficiency 05/07/2020   Muscle weakness 05/06/2020   Benign essential hypertension 06/08/2017   Acid reflux disease 03/08/2016   Hyperlipidemia 03/08/2016   Nocturia 03/08/2016   Restless leg syndrome  03/08/2016   Chest tightness 08/21/2012    Past Surgical History:  Procedure Laterality Date   ABDOMINAL HYSTERECTOMY  02/1984   BACK SURGERY     CATARACT EXTRACTION, BILATERAL     LEFT HEART CATH AND CORONARY ANGIOGRAPHY N/A 04/22/2021   Procedure: LEFT HEART CATH AND CORONARY ANGIOGRAPHY;  Surgeon: Burnell Blanks, MD;  Location: [facility];  Service: Cardiovascular;  Laterality: N/A;   LUMBAR LAMINECTOMY     OOPHORECTOMY  02/29/1984   unilateral     OB History   No obstetric history on file.     Family History  Problem Relation Age of Onset   Cancer Mother    Leukemia Mother    Lung cancer Father    Multiple myeloma Father    Multiple sclerosis Sister    Alcohol abuse Brother    Multiple myeloma Brother    Colon cancer Neg Hx    Colon polyps Neg Hx    Stomach cancer Neg Hx    Esophageal cancer Neg Hx     Social History   Tobacco Use   Smoking status: Never   Smokeless tobacco: Never  Vaping Use   Vaping Use: Never used  Substance Use Topics   Alcohol use: No   Drug use: No    Home Medications Prior to Admission medications   Medication Sig Start Date End Date Taking? Authorizing Provider  Cholecalciferol (D3 PO) Take 1 tablet by mouth daily.  Yes [provider]  famotidine (PEPCID) 20 MG tablet Take 20 mg by mouth See admin instructions. Take 20 mg by mouth in the morning before breakfast and an additional 20 mg once a day as needed for heartburn   Yes [provider]  gabapentin (NEURONTIN) 100 MG capsule Take 100-200 mg by mouth See admin instructions. Take 200 mg by mouth daily at 3 PM and an additional 100 mg once a day as needed for restless legs   Yes [provider]  LORazepam (ATIVAN) 0.5 MG tablet Take 0.5 mg by mouth 2 (two) times daily as needed for anxiety.    Yes [provider]  Multiple Vitamins-Minerals (CENTRUM SILVER 50+WOMEN) TABS Take 1 tablet by mouth daily with breakfast.   Yes  [provider]  Polyethyl Glycol-Propyl Glycol (SYSTANE PRESERVATIVE FREE OP) 1 drop See admin instructions. Instill 1 drop into the affected eye once a day as needed for irritation   Yes [provider]  zolpidem (AMBIEN) 10 MG tablet Take 10 mg by mouth every evening.   Yes [provider]  aspirin 81 MG EC tablet Take 1 tablet (81 mg total) by mouth daily. Patient not taking: No sig reported 04/23/21 04/23/22  Ledora Bottcher, PA  atorvastatin (LIPITOR) 80 MG tablet Take 1 tablet (80 mg total) by mouth daily. Patient not taking: No sig reported 05/01/21 05/01/22  Ledora Bottcher, PA  fluticasone (FLOVENT HFA) 110 MCG/ACT inhaler Inhale 1 puff into the lungs 2 (two) times daily. Patient not taking: No sig reported 04/23/21   Ledora Bottcher, PA    Allergies    Escitalopram, Pantoprazole, and Naproxen sodium  Review of Systems   Review of Systems  Constitutional:        Per HPI, otherwise negative  HENT:         Per HPI, otherwise negative  Respiratory:         Per HPI, otherwise negative  Cardiovascular:        Per HPI, otherwise negative  Gastrointestinal:  Negative for vomiting.  Endocrine:       Negative aside from HPI  Genitourinary:        Neg aside from HPI   Musculoskeletal:        Per HPI, otherwise negative  Skin: Negative.   Neurological:  Negative for syncope.   Physical Exam Updated Vital Signs BP (!) 166/90   Pulse 83   Temp 98.3 F (36.8 C) (Oral)   Resp 15   Ht '5\' 3"'  (1.6 m)   Wt 54.4 kg   SpO2 96%   BMI 21.26 kg/m   Physical Exam Vitals and nursing note reviewed.  Constitutional:      General: She is not in acute distress.    Appearance: She is well-developed.  HENT:     Head: Normocephalic and atraumatic.  Eyes:     Conjunctiva/sclera: Conjunctivae normal.  Cardiovascular:     Rate and Rhythm: Normal rate and regular rhythm.     Pulses: Normal pulses.  Pulmonary:     Effort: Pulmonary effort is normal. No  respiratory distress.     Breath sounds: Normal breath sounds. No stridor.  Abdominal:     General: There is no distension.  Skin:    General: Skin is warm and dry.  Neurological:     Mental Status: She is alert and oriented to person, place, and time.     Cranial Nerves: No cranial nerve deficit.  ED Results / Procedures / Treatments   Labs (all labs ordered are listed, but only abnormal results are displayed) Labs Reviewed  COMPREHENSIVE METABOLIC PANEL - Abnormal; Notable for the following components:      Result Value   Glucose, Bld 103 (*)    All other components within normal limits  CBC WITH DIFFERENTIAL/PLATELET - Abnormal; Notable for the following components:   Hemoglobin 15.3 (*)    All other components within normal limits  URINALYSIS, ROUTINE W REFLEX MICROSCOPIC - Abnormal; Notable for the following components:   Color, Urine STRAW (*)    Specific Gravity, Urine 1.002 (*)    All other components within normal limits  ETHANOL  D-DIMER, QUANTITATIVE  BRAIN NATRIURETIC PEPTIDE  TROPONIN I (HIGH SENSITIVITY)  TROPONIN I (HIGH SENSITIVITY)   Cardiac 70s sinus normal Pulse ox 100% room air normal  EKG EKG Interpretation  Date/Time:  Thursday August 05 2021 07:46:54 EDT Ventricular Rate:  39 PR Interval:  199 QRS Duration: 137 QT Interval:  498 QTC Calculation: 402 R Axis:   -67 Text Interpretation: Predominant 2:1 AV block Atrial premature complexes Left bundle branch block Abnormal ECG Confirmed by Carmin Muskrat [phone]) on 08/05/2021 9:41:28 AM  Radiology DG Chest 2 View  Result Date: 08/05/2021 CLINICAL DATA:  Shortness of breath, chest pain EXAM: CHEST - 2 VIEW COMPARISON:  Chest radiograph 04/22/2021 FINDINGS: Unchanged cardiomediastinal silhouette. There is no new focal airspace disease. Haziness of the peripheral lungs bilaterally likely due to overlying soft tissues. There is no large pleural effusion or visible pneumothorax. There is no acute  osseous abnormality. Pectus excavatum. IMPRESSION: No evidence of acute cardiopulmonary disease. Electronically Signed   By: Maurine Simmering M.D.   On: 08/05/2021 08:58   CT Abdomen Pelvis W Contrast  Result Date: 08/05/2021 CLINICAL DATA:  Nausea, dizziness, diarrhea EXAM: CT ABDOMEN AND PELVIS WITH CONTRAST TECHNIQUE: Multidetector CT imaging of the abdomen and pelvis was performed using the standard protocol following bolus administration of intravenous contrast. CONTRAST:  12m OMNIPAQUE IOHEXOL 300 MG/ML  SOLN COMPARISON:  CT chest/abdomen/pelvis 03/19/2021 FINDINGS: Lower chest: Pectus excavatum deformity is again noted. There is no focal consolidation or pleural effusion. The imaged heart is unremarkable. Hepatobiliary: The liver and gallbladder are unremarkable. There is no biliary ductal dilatation. Pancreas: Unremarkable. Spleen: Unremarkable. Adrenals/Urinary Tract: The adrenals are unremarkable. A few small hypodense lesions are seen in both kidneys, too small to characterize but likely small cysts. There are no suspicious lesions. There are no stones. There is no hydronephrosis slight fullness of the right collecting system and ureter is favored to be due to the significantly distended bladder. The bladder is otherwise unremarkable. Stomach/Bowel: The stomach is unremarkable. There is no evidence of bowel obstruction. There is no abnormal bowel wall thickening or inflammatory change. There are scattered sigmoid colonic diverticuli without evidence of acute diverticulitis. Vascular/Lymphatic: There is extensive soft and calcified atherosclerotic plaque throughout the nonaneurysmal abdominal aorta. 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 The major branch vessels are patent. The main portal and splenic veins are patent. There is no abdominal or pelvic lymphadenopathy. Reproductive: The  uterus is surgically absent. There is no adnexal mass. Other: There is no ascites or free air. Musculoskeletal: There is multilevel degenerative change of the lumbar spine, most advanced at L4-L5 and L5-S1. There is no acute osseous abnormality or aggressive osseous lesion. IMPRESSION: 1. No acute finding in  the abdomen or pelvis. 2. Diverticulosis without evidence of acute diverticulitis. 3. 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 Aortic Atherosclerosis (ICD10-I70.0). Electronically Signed   By: Valetta Mole M.D.   On: 08/05/2021 14:13    Procedures Procedures   Medications Ordered in ED Medications  hydrALAZINE (APRESOLINE) injection 10 mg (10 mg Intravenous Given 08/05/21 1122)  LORazepam (ATIVAN) injection 1 mg (1 mg Intravenous Given 08/05/21 1251)  iohexol (OMNIPAQUE) 300 MG/ML solution 100 mL (100 mLs Intravenous Contrast Given 08/05/21 1319)    ED Course  I have reviewed the triage vital signs and the nursing notes.  Pertinent labs & imaging results that were available during my care of the patient were reviewed by me and considered in my medical decision making (see chart for details).  Update: Patient not complaining of abdominal pain.  With her nausea, hypertension, CT scan ordered.  Update: Patient feeling anxious.  She is on Ativan at home, will receive IV dose here.  3:15 PM She and I discussed all findings.  I reviewed her CT, and in comparison with prior CT abdomen, there is no notable changes including repeat demonstration of focal penetrating atherosclerotic ulcer aorta. She is in no distress, blood pressure is markedly better than on arrival, the patient is comfortable with discharge with following with primary care.  She and I discussed all findings including reassuring CT, x-ray, no evidence for ACS, 2 normal  troponin, no evidence for PE/DVT, with no oxygen requirement, normal D-dimer.  CT, as above reassuring.  Given the patient's blood pressure, history of 2 1 AV block, she and I spoke about the importance of follow-up, but without evidence for acute new pathology, endorgan damage, patient discharged in stable condition appropriately.   MDM Rules/Calculators/A&P MDM Number of Diagnoses or Management Options Hypertensive urgency: new, needed workup   Amount and/or Complexity of Data Reviewed Clinical lab tests: ordered and reviewed Tests in the radiology section of CPT: ordered and reviewed Tests in the medicine section of CPT: reviewed and ordered Decide to obtain previous medical records or to obtain history from someone other than the patient: yes Review and summarize past medical records: yes Independent visualization of images, tracings, or specimens: yes  Risk of Complications, Morbidity, and/or Mortality Presenting problems: high Diagnostic procedures: high Management options: high  Critical Care Total time providing critical care: < 30 minutes  Patient Progress Patient progress: improved   Final Clinical Impression(s) / ED Diagnoses Final diagnoses:  Hypertensive urgency     Carmin Muskrat, MD 08/05/21 1517

## 2021-08-05 NOTE — Discharge Instructions (Signed)
As discussed, your evaluation today has been largely reassuring.  But, it is important that you monitor your condition carefully, and do not hesitate to return to the ED if you develop new, or concerning changes in your condition. ? ?Otherwise, please follow-up with your physician for appropriate ongoing care. ? ?

## 2021-08-05 NOTE — ED Triage Notes (Signed)
Pt c/o high blood pressure, nausea, dizziness, feeling cold, body aches since yesterday. Pt reports her SBP this morning was 185. Denies taking any BP medications at home.

## 2021-08-05 NOTE — ED Notes (Signed)
PT reports that she takes her bp periodically d/t fluctuating bp hx and noticed it was high. She states recently over past week she has had chills/body aches. State she feels that liquids "go right through her" but then she feels like she cant get it all out or make herself urinate at all sometimes. Reports that when she went to the bathroom yesterday she felt like she has a lot of discharge from vagina, describes it as viscous

## 2021-08-05 NOTE — ED Notes (Signed)
Patient transported to X-ray 

## 2021-08-13 ENCOUNTER — Other Ambulatory Visit (HOSPITAL_COMMUNITY): Payer: Self-pay

## 2021-08-23 ENCOUNTER — Other Ambulatory Visit (HOSPITAL_COMMUNITY): Payer: Self-pay

## 2021-10-20 ENCOUNTER — Emergency Department (HOSPITAL_COMMUNITY)
Admission: EM | Admit: 2021-10-20 | Discharge: 2021-10-20 | Disposition: A | Discharge status: Home / Self Care | Payer: Medicare Other | Attending: Emergency Medicine | Admitting: Emergency Medicine

## 2021-10-20 ENCOUNTER — Emergency Department (HOSPITAL_COMMUNITY): Payer: Medicare Other

## 2021-10-20 ENCOUNTER — Other Ambulatory Visit: Payer: Self-pay

## 2021-10-20 ENCOUNTER — Encounter (HOSPITAL_COMMUNITY): Payer: Self-pay | Admitting: Emergency Medicine

## 2021-10-20 DIAGNOSIS — Z8616 Personal history of COVID-19: Secondary | ICD-10-CM | POA: Insufficient documentation

## 2021-10-20 DIAGNOSIS — Z7982 Long term (current) use of aspirin: Secondary | ICD-10-CM | POA: Insufficient documentation

## 2021-10-20 DIAGNOSIS — I2510 Atherosclerotic heart disease of native coronary artery without angina pectoris: Secondary | ICD-10-CM | POA: Insufficient documentation

## 2021-10-20 DIAGNOSIS — I10 Essential (primary) hypertension: Secondary | ICD-10-CM | POA: Diagnosis not present

## 2021-10-20 DIAGNOSIS — R112 Nausea with vomiting, unspecified: Secondary | ICD-10-CM | POA: Insufficient documentation

## 2021-10-20 DIAGNOSIS — R300 Dysuria: Secondary | ICD-10-CM | POA: Diagnosis not present

## 2021-10-20 DIAGNOSIS — Z79899 Other long term (current) drug therapy: Secondary | ICD-10-CM | POA: Insufficient documentation

## 2021-10-20 DIAGNOSIS — F419 Anxiety disorder, unspecified: Secondary | ICD-10-CM | POA: Insufficient documentation

## 2021-10-20 DIAGNOSIS — R3589 Other polyuria: Secondary | ICD-10-CM | POA: Diagnosis not present

## 2021-10-20 DIAGNOSIS — R1030 Lower abdominal pain, unspecified: Secondary | ICD-10-CM | POA: Diagnosis not present

## 2021-10-20 DIAGNOSIS — R630 Anorexia: Secondary | ICD-10-CM | POA: Diagnosis not present

## 2021-10-20 LAB — CBC WITH DIFFERENTIAL/PLATELET
Abs Immature Granulocytes: 0.02 10*3/uL (ref 0.00–0.07)
Basophils Absolute: 0 10*3/uL (ref 0.0–0.1)
Basophils Relative: 0 %
Eosinophils Absolute: 0 10*3/uL (ref 0.0–0.5)
Eosinophils Relative: 0 %
HCT: 40.5 % (ref 36.0–46.0)
Hemoglobin: 13.8 g/dL (ref 12.0–15.0)
Immature Granulocytes: 0 %
Lymphocytes Relative: 12 %
Lymphs Abs: 0.9 10*3/uL (ref 0.7–4.0)
MCH: 31.1 pg (ref 26.0–34.0)
MCHC: 34.1 g/dL (ref 30.0–36.0)
MCV: 91.2 fL (ref 80.0–100.0)
Monocytes Absolute: 0.4 10*3/uL (ref 0.1–1.0)
Monocytes Relative: 6 %
Neutro Abs: 5.8 10*3/uL (ref 1.7–7.7)
Neutrophils Relative %: 82 %
Platelets: 260 10*3/uL (ref 150–400)
RBC: 4.44 MIL/uL (ref 3.87–5.11)
RDW: 13 % (ref 11.5–15.5)
WBC: 7.2 10*3/uL (ref 4.0–10.5)
nRBC: 0 % (ref 0.0–0.2)

## 2021-10-20 LAB — URINALYSIS, ROUTINE W REFLEX MICROSCOPIC
Bilirubin Urine: NEGATIVE
Glucose, UA: NEGATIVE mg/dL
Hgb urine dipstick: NEGATIVE
Ketones, ur: NEGATIVE mg/dL
Leukocytes,Ua: NEGATIVE
Nitrite: NEGATIVE
Protein, ur: NEGATIVE mg/dL
Specific Gravity, Urine: <1.005 — ABNORMAL LOW (ref 1.005–1.030)
pH: 5.5 (ref 5.0–8.0)

## 2021-10-20 LAB — COMPREHENSIVE METABOLIC PANEL
ALT: 17 U/L (ref 0–44)
AST: 22 U/L (ref 15–41)
Albumin: 4.3 g/dL (ref 3.5–5.0)
Alkaline Phosphatase: 78 U/L (ref 38–126)
Anion gap: 11 (ref 5–15)
BUN: 8 mg/dL (ref 8–23)
CO2: 26 mmol/L (ref 22–32)
Calcium: 9.5 mg/dL (ref 8.9–10.3)
Chloride: 99 mmol/L (ref 98–111)
Creatinine, Ser: 0.95 mg/dL (ref 0.44–1.00)
GFR, Estimated: >60 mL/min (ref >60)
Glucose, Bld: 118 mg/dL — ABNORMAL HIGH (ref 70–99)
Potassium: 3.4 mmol/L — ABNORMAL LOW (ref 3.5–5.1)
Sodium: 136 mmol/L (ref 135–145)
Total Bilirubin: 0.6 mg/dL (ref 0.3–1.2)
Total Protein: 7.2 g/dL (ref 6.5–8.1)

## 2021-10-20 LAB — ETHANOL: Alcohol, Ethyl (B): <10 mg/dL (ref <10)

## 2021-10-20 MED ORDER — PHENAZOPYRIDINE HCL 200 MG PO TABS: 200.0000 mg | ORAL_TABLET | Freq: Three times a day (TID) | ORAL | 0 refills | Status: DC

## 2021-10-20 MED ORDER — ONDANSETRON HCL 4 MG/2ML IJ SOLN
4.0000 mg | Freq: Once | INTRAMUSCULAR | Status: AC
  Administered 2021-10-20: 09:00:00 4 mg via INTRAVENOUS
  Filled 2021-10-20: qty 2

## 2021-10-20 MED ORDER — IOHEXOL 300 MG/ML  SOLN
100.0000 mL | Freq: Once | INTRAMUSCULAR | Status: AC | PRN
  Administered 2021-10-20: 10:00:00 100 mL via INTRAVENOUS

## 2021-10-20 MED ORDER — SODIUM CHLORIDE 0.9 % IV BOLUS
1000.0000 mL | Freq: Once | INTRAVENOUS | Status: AC
  Administered 2021-10-20: 09:00:00 1000 mL via INTRAVENOUS

## 2021-10-20 MED ORDER — GABAPENTIN 300 MG PO CAPS: 300.0000 mg | ORAL_CAPSULE | Freq: Three times a day (TID) | ORAL | 0 refills | Status: DC

## 2021-10-20 MED ORDER — FENTANYL CITRATE PF 50 MCG/ML IJ SOSY
12.5000 ug | PREFILLED_SYRINGE | Freq: Once | INTRAMUSCULAR | Status: AC
  Administered 2021-10-20: 09:00:00 12.5 ug via INTRAVENOUS
  Filled 2021-10-20: qty 1

## 2021-10-20 NOTE — ED Triage Notes (Signed)
Pt to the ED with abdominal pain for the past week. Last BM was yesterday. Pt states she feels like she need to Urinate all the time.

## 2021-10-20 NOTE — ED Notes (Signed)
Pt up to bathroom and back to bed.

## 2021-10-20 NOTE — ED Notes (Signed)
Refused d/c vitals.

## 2021-10-20 NOTE — Discharge Instructions (Signed)
As discussed, your evaluation today has been largely reassuring.  But, it is important that you monitor your condition carefully, and do not hesitate to return to the ED if you develop new, or concerning changes in your condition. ? ?Otherwise, please follow-up with your physician for appropriate ongoing care. ? ?

## 2021-10-20 NOTE — ED Provider Notes (Signed)
[facility] EMERGENCY DEPARTMENT Provider Note   CSN: [id number] Arrival date & time: 10/20/21  4034     History Chief Complaint  Patient presents with   Abdominal Pain    Kayla Terry is a 75 y.o. female.  HPI Patient presents with 1 week of suprapubic pain, polyuria, shakiness.  No clear precipitant, but since onset symptoms have been persistent with pain in the suprapubic region seemingly rating bilaterally, circumferentially.  There is associated nausea, anorexia, but no obvious vomiting, nor bowel movement changes.  However, patient has a dysuria, polyuria. Is unclear why she waited 1 week to seek medical care, she notes that she is taking no new medication, nor seen her physician during that time.    Past Medical History:  Diagnosis Date   Allergy    B12 deficiency 04/2020   Cataract    Coronary artery disease    COVID-19 04/22/2021   Fatty liver    GERD (gastroesophageal reflux disease)    Herpes zoster without complications    Hiatal hernia    Hypertension    Mild mitral regurgitation 2013   Mobitz type 2 second degree atrioventricular block    Pectus excavatum    pectus excavatum deformity resulting in mass effect on the RV by CT 03/2020   Pulmonary nodule    Shingles     Patient Active Problem List   Diagnosis Date Noted   COVID-19 virus infection 04/23/2021   Coronary artery disease 04/23/2021   Chest pain 04/22/2021   Chest pain of uncertain etiology    Nonspecific abnormal electrocardiogram (ECG) (EKG)    2nd degree AV block 03/25/2021   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   B12 deficiency 05/07/2020   Muscle weakness 05/06/2020   Benign essential hypertension 06/08/2017   Acid reflux disease 03/08/2016   Hyperlipidemia 03/08/2016   Nocturia 03/08/2016   Restless leg syndrome 03/08/2016   Chest tightness 08/21/2012    Past Surgical History:  Procedure Laterality Date   ABDOMINAL HYSTERECTOMY  02/1984   BACK SURGERY      CATARACT EXTRACTION, BILATERAL     LEFT HEART CATH AND CORONARY ANGIOGRAPHY N/A 04/22/2021   Procedure: LEFT HEART CATH AND CORONARY ANGIOGRAPHY;  Surgeon: Burnell Blanks, MD;  Location: [facility];  Service: Cardiovascular;  Laterality: N/A;   LUMBAR LAMINECTOMY     OOPHORECTOMY  02/29/1984   unilateral     OB History   No obstetric history on file.     Family History  Problem Relation Age of Onset   Cancer Mother    Leukemia Mother    Lung cancer Father    Multiple myeloma Father    Multiple sclerosis Sister    Alcohol abuse Brother    Multiple myeloma Brother    Colon cancer Neg Hx    Colon polyps Neg Hx    Stomach cancer Neg Hx    Esophageal cancer Neg Hx     Social History   Tobacco Use   Smoking status: Never   Smokeless tobacco: Never  Vaping Use   Vaping Use: Never used  Substance Use Topics   Alcohol use: No   Drug use: No    Home Medications Prior to Admission medications   Medication Sig Start Date End Date Taking? Authorizing Provider  famotidine (PEPCID) 20 MG tablet Take 20 mg by mouth See admin instructions. Take 20 mg by mouth in the morning before breakfast and an additional 20 mg once a day as needed for  heartburn   Yes [provider]  gabapentin (NEURONTIN) 100 MG capsule Take 100-200 mg by mouth See admin instructions. Take 200 mg by mouth daily at 3 PM and an additional 100 mg once a day as needed for restless legs   Yes [provider]  LORazepam (ATIVAN) 0.5 MG tablet Take 0.5 mg by mouth 2 (two) times daily as needed for anxiety.    Yes [provider]  Polyethyl Glycol-Propyl Glycol (SYSTANE PRESERVATIVE FREE OP) 1 drop See admin instructions. Instill 1 drop into the affected eye once a day as needed for irritation   Yes [provider]  zolpidem (AMBIEN) 10 MG tablet Take 10 mg by mouth every evening.   Yes [provider]  aspirin 81 MG EC tablet Take 1 tablet (81 mg total) by  mouth daily. Patient not taking: Reported on 08/05/2021 04/23/21 04/23/22  Ledora Bottcher, PA  atorvastatin (LIPITOR) 80 MG tablet Take 1 tablet (80 mg total) by mouth daily. Patient not taking: Reported on 08/05/2021 05/01/21 05/01/22  Ledora Bottcher, PA  fluticasone (FLOVENT HFA) 110 MCG/ACT inhaler Inhale 1 puff into the lungs 2 (two) times daily. Patient not taking: Reported on 08/05/2021 04/23/21   Ledora Bottcher, PA    Allergies    Escitalopram, Pantoprazole, and Naproxen sodium  Review of Systems   Review of Systems  Constitutional:        Per HPI, otherwise negative  HENT:         Per HPI, otherwise negative  Respiratory:         Per HPI, otherwise negative  Cardiovascular:        Per HPI, otherwise negative  Gastrointestinal:  Negative for vomiting.  Endocrine:       Negative aside from HPI  Genitourinary:        Neg aside from HPI   Musculoskeletal:        Per HPI, otherwise negative  Skin: Negative.   Neurological:  Negative for syncope.   Physical Exam Updated Vital Signs BP (!) 174/84    Pulse 67    Temp 97.6 F (36.4 C) (Oral)    Resp 17    Ht _0  (1.6 m)    Wt 54.4 kg    SpO2 94%    BMI 21.24 kg/m   Physical Exam Vitals and nursing note reviewed.  Constitutional:      General: She is not in acute distress.    Appearance: She is well-developed.  HENT:     Head: Normocephalic and atraumatic.  Eyes:     Conjunctiva/sclera: Conjunctivae normal.  Cardiovascular:     Rate and Rhythm: Normal rate and regular rhythm.  Pulmonary:     Effort: Pulmonary effort is normal. No respiratory distress.     Breath sounds: Normal breath sounds. No stridor.  Abdominal:     General: There is no distension.     Tenderness: There is abdominal tenderness in the suprapubic area.  Skin:    General: Skin is warm and dry.  Neurological:     Mental Status: She is alert and oriented to person, place, and time.     Cranial Nerves: No cranial nerve deficit.   Psychiatric:        Mood and Affect: Mood is anxious.    ED Results / Procedures / Treatments   Labs (all labs ordered are listed, but only abnormal results are displayed) Labs Reviewed  COMPREHENSIVE METABOLIC PANEL - Abnormal; Notable for the following  components:      Result Value   Potassium 3.4 (*)    Glucose, Bld 118 (*)    All other components within normal limits  URINALYSIS, ROUTINE W REFLEX MICROSCOPIC - Abnormal; Notable for the following components:   Specific Gravity, Urine <1.005 (*)    All other components within normal limits  ETHANOL  CBC WITH DIFFERENTIAL/PLATELET    EKG None  Radiology CT Abdomen Pelvis W Contrast  Result Date: 10/20/2021 CLINICAL DATA:  Abdominal pain EXAM: CT ABDOMEN AND PELVIS WITH CONTRAST TECHNIQUE: Multidetector CT imaging of the abdomen and pelvis was performed using the standard protocol following bolus administration of intravenous contrast. CONTRAST:  126m OMNIPAQUE IOHEXOL 300 MG/ML  SOLN COMPARISON:  CT abdomen and pelvis dated August 05, 2021 FINDINGS: Lower chest: Pectus deformity.  No acute abnormality. Hepatobiliary: No focal liver abnormality is seen. No gallstones, gallbladder wall thickening, or biliary dilatation. Pancreas: Unremarkable. No pancreatic ductal dilatation or surrounding inflammatory changes. Spleen: Normal in size without focal abnormality. Adrenals/Urinary Tract: Bilateral adrenal glands are unremarkable. Kidneys enhance symmetrically with no evidence of hydronephrosis or nephrolithiasis mild bilateral hydroureter. No evidence of obstructing stone or mass. Bladder wall thickening. Small low-attenuation lesion of the mid region of the right kidney, too small completely characterize. Stomach/Bowel: Stomach is within normal limits. Appendix is not visualized. No evidence of bowel wall thickening, distention, or inflammatory changes. Vascular/Lymphatic: Aortic atherosclerosis. No enlarged abdominal or pelvic lymph nodes.  Reproductive: Status post hysterectomy. No adnexal masses. Other: No abdominal wall hernia or abnormality. No abdominopelvic ascites. Musculoskeletal: No acute or significant osseous findings. IMPRESSION: 1. Moderate right and mild left proximal hydroureter with no evidence of hydronephrosis. No obstructing stone or mass visualized. 2. Bladder wall thickening, findings can be seen in the setting of infection, correlate with urinalysis. 3.  Aortic Atherosclerosis (ICD10-I70.0). Electronically Signed   By: LYetta GlassmanM.D.   On: 10/20/2021 10:45    Procedures Procedures   Medications Ordered in ED Medications  sodium chloride 0.9 % bolus 1,000 mL (0 mLs Intravenous Stopped 10/20/21 1027)  ondansetron (ZOFRAN) injection 4 mg (4 mg Intravenous Given 10/20/21 0849)  fentaNYL (SUBLIMAZE) injection 12.5 mcg (12.5 mcg Intravenous Given 10/20/21 0851)  iohexol (OMNIPAQUE) 300 MG/ML solution 100 mL (100 mLs Intravenous Contrast Given 10/20/21 1015)    ED Course  I have reviewed the triage vital signs and the nursing notes.  Pertinent labs & imaging results that were available during my care of the patient were reviewed by me and considered in my medical decision making (see chart for details).   12:02 PM And in no distress, awake, alert, sitting upright.  She is hemodynamically unremarkable aside from mild hypertension though this is diminished since arrival.  When she and I had a lengthy conversation about today's evaluation including review of her CT scan which did not demonstrate acute findings aside those with possible cystitis, mild hydroureter bilaterally, suggestive of recent infection versus passed stone. She is able to urinate, has no evidence for bacteremia, sepsis, no evidence for acute retention, is appropriate for discharge.  With her history of prior fall some suspicion for lumbar spine radiculopathy contributing to her lower abdominal pain as well.  Patient has inconsistently been  taking gabapentin, was advised to take this regularly, provided prescription to do so, is amenable to following up with her primary care physician. MDM Rules/Calculators/A&P MDM Number of Diagnoses or Management Options Lower abdominal pain: new, needed workup   Amount and/or Complexity of Data Reviewed Clinical  lab tests: ordered and reviewed Tests in the radiology section of CPT: ordered and reviewed Tests in the medicine section of CPT: reviewed and ordered Decide to obtain previous medical records or to obtain history from someone other than the patient: yes Review and summarize past medical records: yes Independent visualization of images, tracings, or specimens: yes  Risk of Complications, Morbidity, and/or Mortality Presenting problems: high Diagnostic procedures: high Management options: high  Critical Care Total time providing critical care: < 30 minutes  Patient Progress Patient progress: improved   Final Clinical Impression(s) / ED Diagnoses Final diagnoses:  Lower abdominal pain    Rx / DC Orders ED Discharge Orders          Ordered    gabapentin (NEURONTIN) 300 MG capsule  3 times daily        10/20/21 1204    phenazopyridine (PYRIDIUM) 200 MG tablet  3 times daily        10/20/21 1204             Carmin Muskrat, MD 10/20/21 1204

## 2021-11-09 ENCOUNTER — Other Ambulatory Visit (HOSPITAL_COMMUNITY): Payer: Self-pay

## 2021-11-25 ENCOUNTER — Ambulatory Visit: Payer: Medicare Other | Admitting: Gastroenterology

## 2021-12-25 IMAGING — CT CT ABD-PELV W/ CM
2 of 4 series · 16 of 46 positions shown, 18 images · IV contrast (Omnipaque or Isovue)
Comparison: None.

CLINICAL DATA: Nausea, vomiting and weakness.

EXAM:
CT ABDOMEN AND PELVIS WITH CONTRAST
TECHNIQUE: Multidetector CT imaging of the abdomen and pelvis was performed
using the standard protocol following bolus administration of
intravenous contrast.
CONTRAST:  100mL OMNIPAQUE IOHEXOL 300 MG/ML  SOLN

[Series 2: axial st · axial · 0.85mm/px · z∈[+1184,+1584]mm · 13 of 90 slices shown, 15 images]
[im 5/90  soft-tissue]
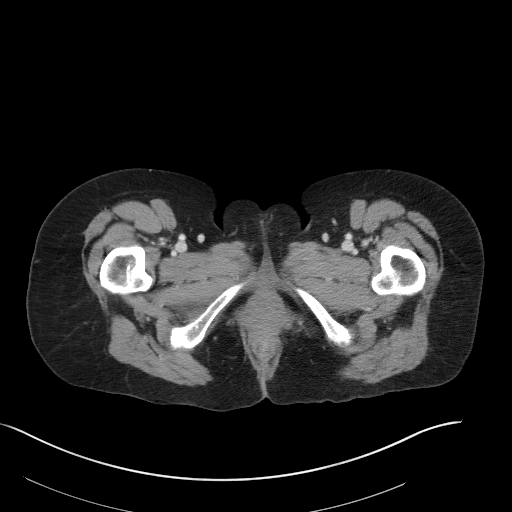
[im 5/90  bone]
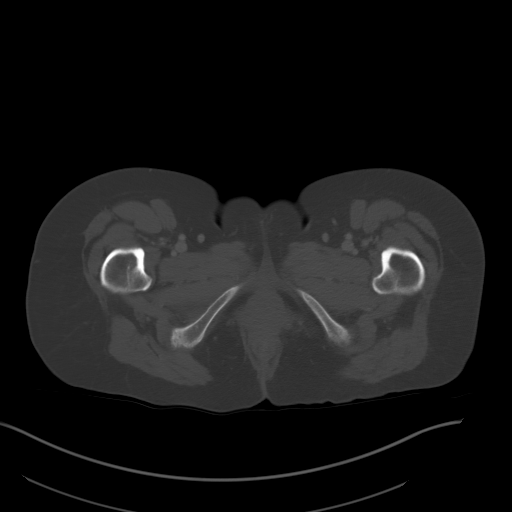
[im 13/90  soft-tissue]
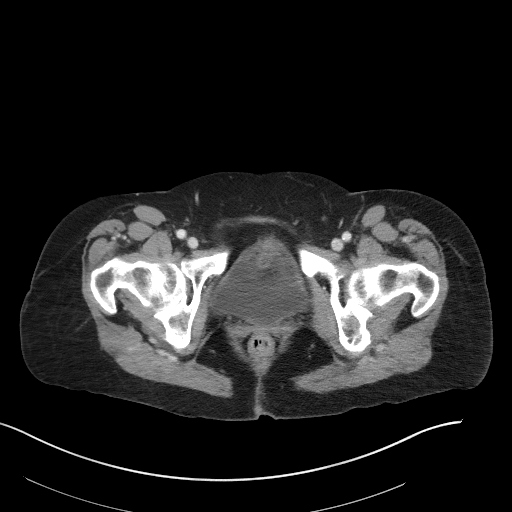
[im 17/90  soft-tissue]
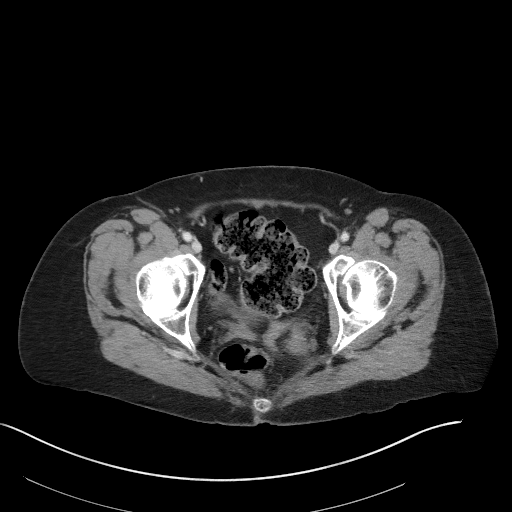
[im 26/90  soft-tissue]
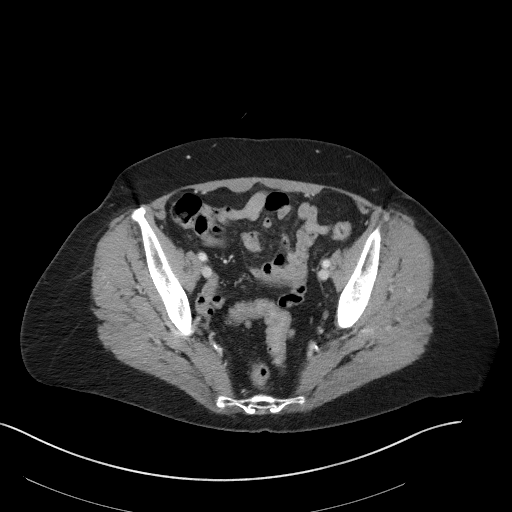
[im 30/90  soft-tissue]
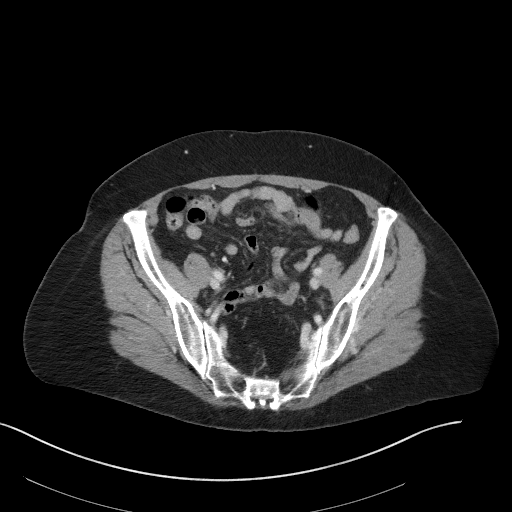
[im 39/90  soft-tissue]
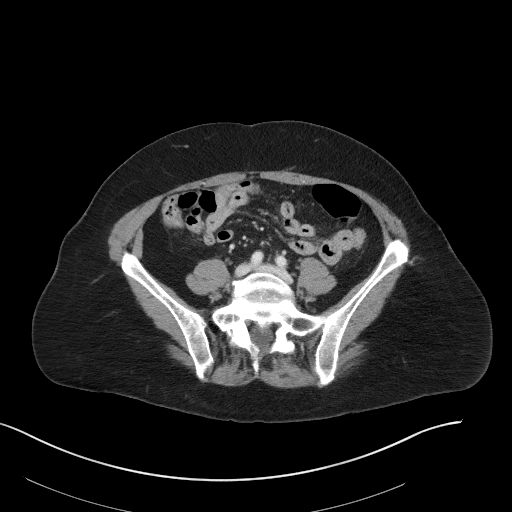
[im 47/90  soft-tissue]
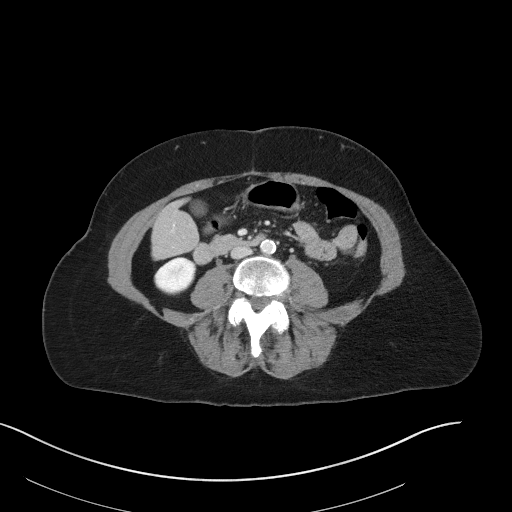
[im 51/90  soft-tissue]
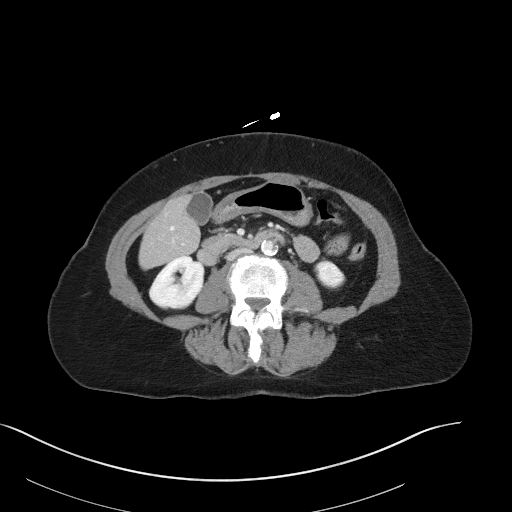
[im 60/90  soft-tissue]
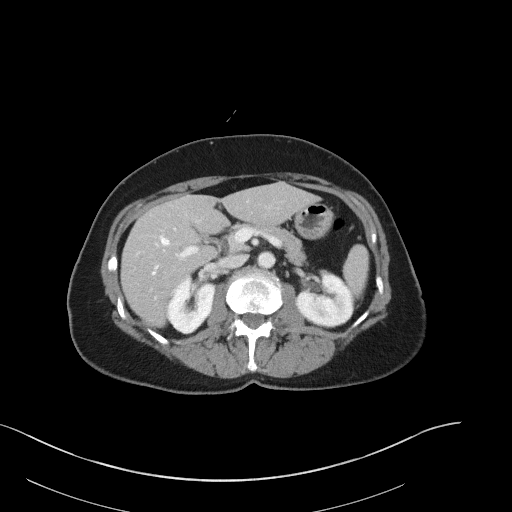
[im 60/90  bone]
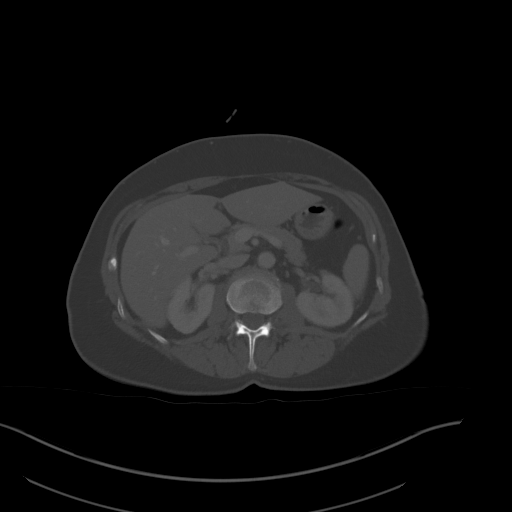
[im 64/90  soft-tissue]
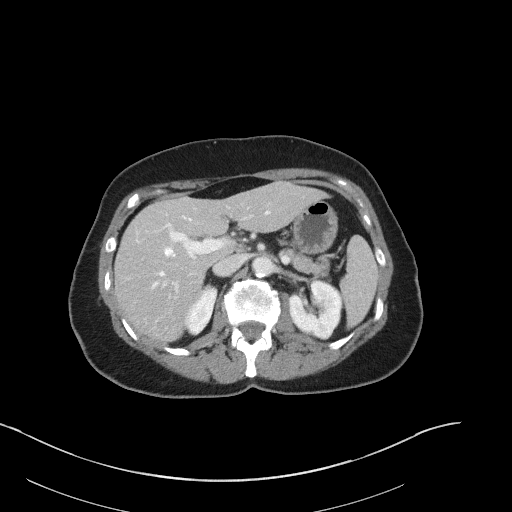
[im 73/90  soft-tissue]
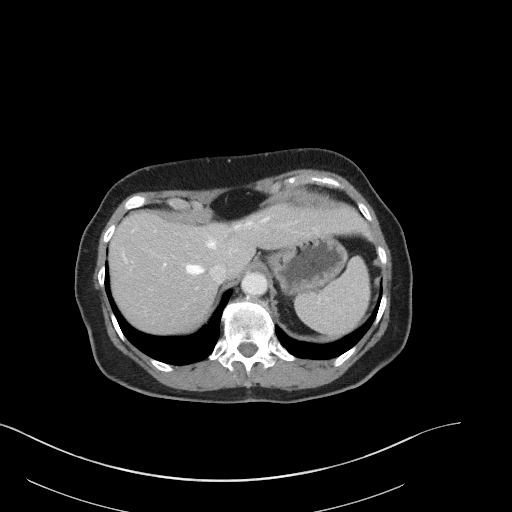
[im 77/90  soft-tissue]
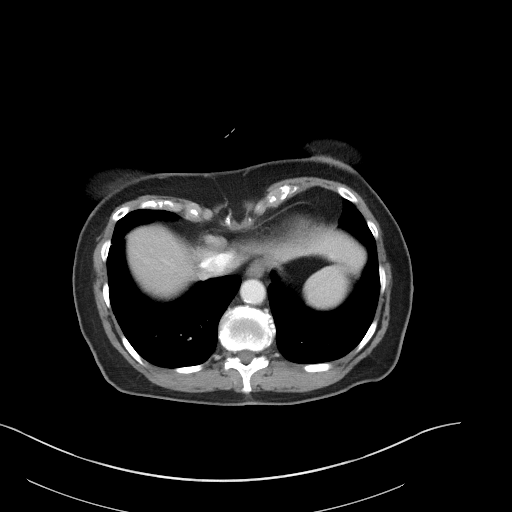
[im 85/90  soft-tissue]
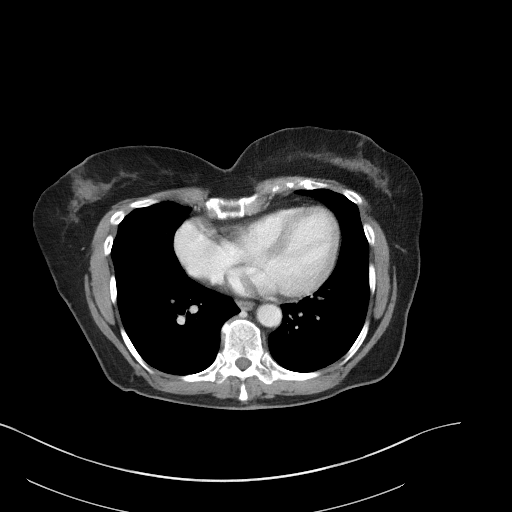

[Series 5: coronal st · coronal · 0.78mm/px · 3 of 76 slices shown]
[im 26/76  soft-tissue]
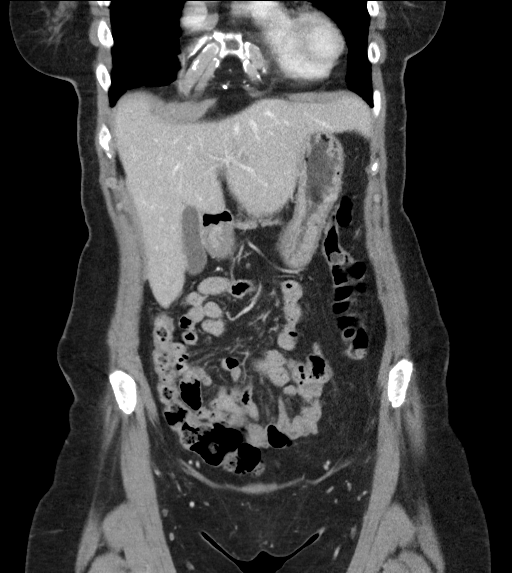
[im 34/76  soft-tissue]
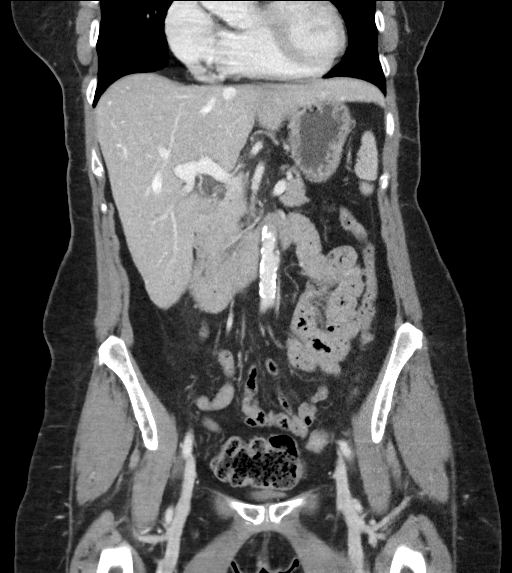
[im 42/76  soft-tissue]
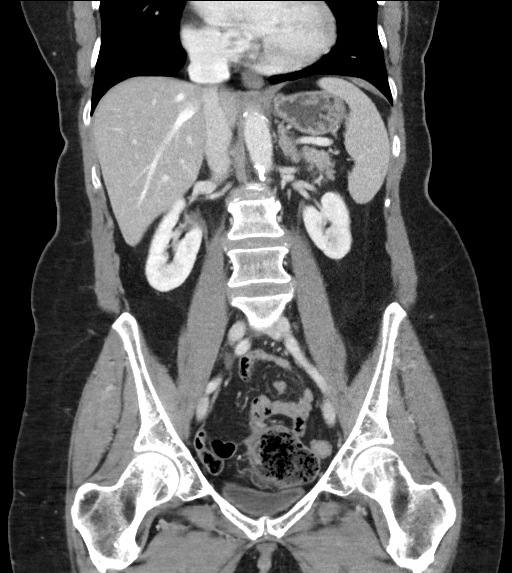

[16 of 46 positions shown; findings below may reference images not displayed]

FINDINGS: Lower chest: Marked pectus deformity noted with significant mass
effect on the right ventricle. No pericardial effusion. The lung
bases are clear. No pleural effusion.

Hepatobiliary: No hepatic lesions or intrahepatic biliary
dilatation. The gallbladder is normal. No common bile duct
dilatation.

Pancreas: No mass, inflammation or ductal dilatation.

Spleen: Normal size. No focal lesions.

Adrenals/Urinary Tract: Adrenal glands are normal.

Small right renal cysts but no worrisome renal lesions, renal
calculi or hydroureteronephrosis. The bladder is unremarkable.

Stomach/Bowel: The stomach, duodenum, small bowel and colon are
grossly normal without oral contrast. No acute inflammatory changes,
mass lesions or obstructive findings. The appendix is not identified
for certain but I do not see any findings to suggest appendicitis.

Vascular/Lymphatic: Advanced atherosclerotic calcifications
involving the aorta but no aneurysm or dissection. The branch
vessels are patent.

No mesenteric or retroperitoneal mass or adenopathy.

Reproductive: The uterus is surgically absent. The right ovary is
still present and appears normal. I do not see the left ovary for
certain.

Other: No pelvic mass or adenopathy. No free pelvic fluid
collections. No inguinal mass or adenopathy. No abdominal wall
hernia or subcutaneous lesions.

Musculoskeletal: No significant bony findings.
IMPRESSION: 1. No acute abdominal/pelvic findings, mass lesions or adenopathy.
2. Advanced atherosclerotic calcifications involving the aorta but
no aneurysm or dissection.
3. Marked pectus deformity with significant mass effect on the right
ventricle.

Aortic Atherosclerosis ([id number]).

## 2022-01-13 ENCOUNTER — Emergency Department (HOSPITAL_COMMUNITY): Payer: Medicare Other

## 2022-01-13 ENCOUNTER — Encounter (HOSPITAL_COMMUNITY): Payer: Self-pay | Admitting: Emergency Medicine

## 2022-01-13 ENCOUNTER — Emergency Department (HOSPITAL_COMMUNITY)
Admission: EM | Admit: 2022-01-13 | Discharge: 2022-01-13 | Disposition: A | Discharge status: Home / Self Care | Payer: Medicare Other | Attending: Emergency Medicine | Admitting: Emergency Medicine

## 2022-01-13 DIAGNOSIS — R1084 Generalized abdominal pain: Secondary | ICD-10-CM | POA: Diagnosis present

## 2022-01-13 DIAGNOSIS — Z7982 Long term (current) use of aspirin: Secondary | ICD-10-CM | POA: Diagnosis not present

## 2022-01-13 LAB — URINALYSIS, ROUTINE W REFLEX MICROSCOPIC
Bilirubin Urine: NEGATIVE
Glucose, UA: NEGATIVE mg/dL
Hgb urine dipstick: NEGATIVE
Ketones, ur: NEGATIVE mg/dL
Leukocytes,Ua: NEGATIVE
Nitrite: NEGATIVE
Protein, ur: NEGATIVE mg/dL
Specific Gravity, Urine: 1.003 — ABNORMAL LOW (ref 1.005–1.030)
pH: 6 (ref 5.0–8.0)

## 2022-01-13 LAB — CBC
HCT: 42.5 % (ref 36.0–46.0)
Hemoglobin: 14.2 g/dL (ref 12.0–15.0)
MCH: 30.1 pg (ref 26.0–34.0)
MCHC: 33.4 g/dL (ref 30.0–36.0)
MCV: 90.2 fL (ref 80.0–100.0)
Platelets: 260 10*3/uL (ref 150–400)
RBC: 4.71 MIL/uL (ref 3.87–5.11)
RDW: 13 % (ref 11.5–15.5)
WBC: 6.6 10*3/uL (ref 4.0–10.5)
nRBC: 0 % (ref 0.0–0.2)

## 2022-01-13 LAB — TROPONIN I (HIGH SENSITIVITY)
Troponin I (High Sensitivity): 3 ng/L (ref <18)
Troponin I (High Sensitivity): 4 ng/L (ref <18)

## 2022-01-13 LAB — COMPREHENSIVE METABOLIC PANEL
ALT: 16 U/L (ref 0–44)
AST: 20 U/L (ref 15–41)
Albumin: 4.4 g/dL (ref 3.5–5.0)
Alkaline Phosphatase: 83 U/L (ref 38–126)
Anion gap: 13 (ref 5–15)
BUN: 11 mg/dL (ref 8–23)
CO2: 25 mmol/L (ref 22–32)
Calcium: 9.6 mg/dL (ref 8.9–10.3)
Chloride: 99 mmol/L (ref 98–111)
Creatinine, Ser: 1.06 mg/dL — ABNORMAL HIGH (ref 0.44–1.00)
GFR, Estimated: 55 mL/min — ABNORMAL LOW (ref >60)
Glucose, Bld: 108 mg/dL — ABNORMAL HIGH (ref 70–99)
Potassium: 3.4 mmol/L — ABNORMAL LOW (ref 3.5–5.1)
Sodium: 137 mmol/L (ref 135–145)
Total Bilirubin: 0.7 mg/dL (ref 0.3–1.2)
Total Protein: 7.6 g/dL (ref 6.5–8.1)

## 2022-01-13 LAB — LIPASE, BLOOD: Lipase: 36 U/L (ref 11–51)

## 2022-01-13 MED ORDER — IOHEXOL 300 MG/ML  SOLN
100.0000 mL | Freq: Once | INTRAMUSCULAR | Status: AC | PRN
  Administered 2022-01-13: 100 mL via INTRAVENOUS

## 2022-01-13 MED ORDER — SUCRALFATE 1 G PO TABS: 1.0000 g | ORAL_TABLET | Freq: Three times a day (TID) | ORAL | 1 refills | Status: DC

## 2022-01-13 NOTE — Discharge Instructions (Signed)
Follow-up with your family doctor next week for recheck. 

## 2022-01-13 NOTE — ED Triage Notes (Signed)
Pt c/o generalized abd pain, mid back pain and some upper chest pain for "a while". Pt also states that her legs feel like jello.  ?

## 2022-01-13 NOTE — ED Provider Notes (Signed)
[facility] EMERGENCY DEPARTMENT Provider Note   CSN: [id number] Arrival date & time: 01/13/22  7829     History  Chief Complaint  Patient presents with   Abdominal Pain    Kayla Terry is a 76 y.o. female.  Patient complains of upper abdominal pain.  She has a long history of GERD symptoms  The history is provided by the patient and medical records. No language interpreter was used.  Abdominal Pain Pain location:  Generalized Pain quality: aching   Pain radiates to:  Does not radiate Pain severity:  Moderate Onset quality:  Sudden Timing:  Constant Progression:  Worsening Chronicity:  Recurrent Context: not alcohol use   Associated symptoms: no chest pain, no cough, no diarrhea, no fatigue and no hematuria       Home Medications Prior to Admission medications   Medication Sig Start Date End Date Taking? Authorizing Provider  famotidine (PEPCID) 20 MG tablet Take 20 mg by mouth See admin instructions. Take 20 mg by mouth in the morning before breakfast and an additional 20 mg once a day as needed for heartburn   Yes [provider]  gabapentin (NEURONTIN) 300 MG capsule Take 1 capsule (300 mg total) by mouth 3 (three) times daily. Take 200 mg by mouth daily at 3 PM and an additional 100 mg once a day as needed for restless legs 10/20/21  Yes Gerhard Munch, MD  LORazepam (ATIVAN) 0.5 MG tablet Take 0.5 mg by mouth 2 (two) times daily as needed for anxiety.    Yes [provider]  sucralfate (CARAFATE) 1 g tablet Take 1 tablet (1 g total) by mouth 4 (four) times daily -  with meals and at bedtime. 01/13/22  Yes Bethann Berkshire, MD  traZODone (DESYREL) 50 MG tablet Take 50 mg by mouth at bedtime. 01/01/22  Yes [provider]  zolpidem (AMBIEN) 10 MG tablet Take 10 mg by mouth every evening.   Yes [provider]  aspirin 81 MG EC tablet Take 1 tablet (81 mg total) by mouth daily. Patient not taking: Reported on 08/05/2021 04/23/21 04/23/22   Marcelino Duster, PA  atorvastatin (LIPITOR) 80 MG tablet Take 1 tablet (80 mg total) by mouth daily. Patient not taking: Reported on 08/05/2021 05/01/21 05/01/22  Marcelino Duster, PA  fluticasone (FLOVENT HFA) 110 MCG/ACT inhaler Inhale 1 puff into the lungs 2 (two) times daily. Patient not taking: Reported on 08/05/2021 04/23/21   Marcelino Duster, PA  phenazopyridine (PYRIDIUM) 200 MG tablet Take 1 tablet (200 mg total) by mouth 3 (three) times daily. Patient not taking: Reported on 01/13/2022 10/20/21   Gerhard Munch, MD      Allergies    Escitalopram, Pantoprazole, and Naproxen sodium    Review of Systems   Review of Systems  Constitutional:  Negative for appetite change and fatigue.  HENT:  Negative for congestion, ear discharge and sinus pressure.   Eyes:  Negative for discharge.  Respiratory:  Negative for cough.   Cardiovascular:  Negative for chest pain.  Gastrointestinal:  Positive for abdominal pain. Negative for diarrhea.  Genitourinary:  Negative for frequency and hematuria.  Musculoskeletal:  Negative for back pain.  Skin:  Negative for rash.  Neurological:  Negative for seizures and headaches.  Psychiatric/Behavioral:  Negative for hallucinations.    Physical Exam Updated Vital Signs BP (!) 163/98   Pulse 66   Temp 98 F (36.7 C)   Resp 18   Ht 5\' 3"  (1.6 m)  Wt 56.7 kg   SpO2 97%   BMI 22.14 kg/m  Physical Exam Vitals and nursing note reviewed.  Constitutional:      Appearance: She is well-developed.  HENT:     Head: Normocephalic.     Nose: Nose normal.  Eyes:     General: No scleral icterus.    Conjunctiva/sclera: Conjunctivae normal.  Neck:     Thyroid: No thyromegaly.  Cardiovascular:     Rate and Rhythm: Normal rate and regular rhythm.     Heart sounds: No murmur heard.   No friction rub. No gallop.  Pulmonary:     Breath sounds: No stridor. No wheezing or rales.  Chest:     Chest wall: No tenderness.  Abdominal:     General:  There is no distension.     Tenderness: There is abdominal tenderness. There is no rebound.  Musculoskeletal:        General: Normal range of motion.     Cervical back: Neck supple.  Lymphadenopathy:     Cervical: No cervical adenopathy.  Skin:    Findings: No erythema or rash.  Neurological:     Mental Status: She is alert and oriented to person, place, and time.     Motor: No abnormal muscle tone.     Coordination: Coordination normal.  Psychiatric:        Behavior: Behavior normal.    ED Results / Procedures / Treatments   Labs (all labs ordered are listed, but only abnormal results are displayed) Labs Reviewed  COMPREHENSIVE METABOLIC PANEL - Abnormal; Notable for the following components:      Result Value   Potassium 3.4 (*)    Glucose, Bld 108 (*)    Creatinine, Ser 1.06 (*)    GFR, Estimated 55 (*)    All other components within normal limits  URINALYSIS, ROUTINE W REFLEX MICROSCOPIC - Abnormal; Notable for the following components:   Color, Urine STRAW (*)    Specific Gravity, Urine 1.003 (*)    All other components within normal limits  LIPASE, BLOOD  CBC  TROPONIN I (HIGH SENSITIVITY)  TROPONIN I (HIGH SENSITIVITY)    EKG None  Radiology DG Chest 2 View  Result Date: 01/13/2022 CLINICAL DATA:  Chest pain, possible abnormal portable radiograph EXAM: CHEST - 2 VIEW COMPARISON:  Earlier same day FINDINGS: No consolidation or edema. No definite opacity in the upper right lung. An area of increased density is at a site of overlapping anterior and posterior ribs. No pleural effusion. Normal heart size. IMPRESSION: No acute process in the chest. Electronically Signed   By: Guadlupe Spanish M.D.   On: 01/13/2022 10:35   CT ABDOMEN PELVIS W CONTRAST  Result Date: 01/13/2022 CLINICAL DATA:  Abdominal pain, acute nonlocalized. Generalized abdominal pain for 2 months. EXAM: CT ABDOMEN AND PELVIS WITH CONTRAST TECHNIQUE: Multidetector CT imaging of the abdomen and pelvis  was performed using the standard protocol following bolus administration of intravenous contrast. RADIATION DOSE REDUCTION: This exam was performed according to the departmental dose-optimization program which includes automated exposure control, adjustment of the mA and/or kV according to patient size and/or use of iterative reconstruction technique. CONTRAST:  OMNIPAQUE IOHEXOL 300 MG/ML  SOLN COMPARISON:  Abdominopelvic CT 10/20/2021 FINDINGS: Lower chest: Clear lung bases. No significant pleural or pericardial effusion. Hepatobiliary: The liver is normal in density without suspicious focal abnormality. No evidence of gallstones, gallbladder wall thickening or biliary dilatation. Pancreas: Unremarkable. No pancreatic ductal dilatation or surrounding inflammatory changes.  Spleen: Normal in size without focal abnormality. Adrenals/Urinary Tract: Both adrenal glands appear normal. There is a stable probable small cyst in the interpolar region of the right kidney. There is stable mild dilatation of both renal pelves and the right ureter, but no evidence renal, ureteral or bladder calculus. There is no delay in contrast excretion. The bladder appears unremarkable. Stomach/Bowel: No enteric contrast administered. The stomach appears unremarkable for its degree of distension. No evidence of bowel wall thickening, distention or surrounding inflammatory change. The appendix is not visualized, but there are no pericecal inflammatory changes to suggest appendicitis. Vascular/Lymphatic: There are no enlarged abdominal or pelvic lymph nodes. Diffuse aortic and branch vessel atherosclerosis without acute vascular findings. The portal, superior mesenteric and splenic veins are patent. Reproductive: Hysterectomy.  No adnexal mass. Other: No evidence of abdominal wall mass or hernia. Trace pelvic ascites. Musculoskeletal: No acute or significant osseous findings. Pectus deformity in lower lumbar facet arthropathy are noted.  IMPRESSION: 1. No acute findings or explanation for the patient's symptoms. Nonspecific trace pelvic ascites. 2. Stable mild dilatation of both renal pelves and the right ureter without evidence of urinary tract calculus or ureteral obstruction. 3. Distal colonic diverticulosis without evidence of acute inflammation. 4.  Aortic Atherosclerosis (ICD10-I70.0). Electronically Signed   By: Carey Bullocks M.D.   On: 01/13/2022 08:46   DG Chest Portable 1 View  Result Date: 01/13/2022 CLINICAL DATA:  Chest pain, generalized abdominal pain. EXAM: PORTABLE CHEST 1 VIEW COMPARISON:  08/05/2021 FINDINGS: Heart size is slightly prominent for size but stable. Densities near the right lung apex may be related to a combination of ECG lead and anterior rib. Chronic densities at the medial right lung base. Negative for a pneumothorax. IMPRESSION: 1. Densities in the right upper lung could represent overlying shadows but indeterminate. Recommend repeat study with two views and removing the ECG leads if possible. Otherwise, no acute chest findings. Electronically Signed   By: Richarda Overlie M.D.   On: 01/13/2022 07:34    Procedures Procedures    Medications Ordered in ED Medications  iohexol (OMNIPAQUE) 300 MG/ML solution 100 mL (100 mLs Intravenous Contrast Given 01/13/22 5366)    ED Course/ Medical Decision Making/ A&P                           Medical Decision Making Amount and/or Complexity of Data Reviewed Labs: ordered. Radiology: ordered.  Risk Prescription drug management.  This patient presents to the ED for concern of abdominal pain, this involves an extensive number of treatment options, and is a complaint that carries with it a high risk of complications and morbidity.  The differential diagnosis includes GERD, pancreatitis   Co morbidities that complicate the patient evaluation  GERD   Additional history obtained:  Additional history obtained from patient External records from outside  source obtained and reviewed including hospital record   Lab Tests:  I Ordered, and personally interpreted labs.  The pertinent results include: CBC chemistry liver profile and lipase all unremarkable   Imaging Studies ordered:  I ordered imaging studies including chest x-ray and CT abdomen I independently visualized and interpreted imaging which showed no acute problems I agree with the radiologist interpretation   Cardiac Monitoring:  The patient was maintained on a cardiac monitor.  I personally viewed and interpreted the cardiac monitored which showed an underlying rhythm of: Normal sinus rhythm   Medicines ordered and prescription drug management: No medicine Test Considered:  MRI of the abdomen   Critical Interventions:  None   Consultations Obtained: No consult Problem List / ED Course:  Abdominal pain    Social Determinants of Health:  None       Labs unremarkable.  Patient will be started on Carafate and follow-up with PCP        Final Clinical Impression(s) / ED Diagnoses Final diagnoses:  Pain of upper abdomen    Rx / DC Orders ED Discharge Orders          Ordered    sucralfate (CARAFATE) 1 g tablet  3 times daily with meals & bedtime        01/13/22 1054              Bethann Berkshire, MD 01/14/22 1037

## 2022-01-26 ENCOUNTER — Emergency Department (HOSPITAL_COMMUNITY): Payer: Medicare Other

## 2022-01-26 ENCOUNTER — Other Ambulatory Visit: Payer: Self-pay

## 2022-01-26 ENCOUNTER — Emergency Department (HOSPITAL_COMMUNITY)
Admission: EM | Admit: 2022-01-26 | Discharge: 2022-01-26 | Disposition: A | Discharge status: Home / Self Care | Payer: Medicare Other | Attending: Emergency Medicine | Admitting: Emergency Medicine

## 2022-01-26 DIAGNOSIS — I10 Essential (primary) hypertension: Secondary | ICD-10-CM | POA: Insufficient documentation

## 2022-01-26 DIAGNOSIS — Z7982 Long term (current) use of aspirin: Secondary | ICD-10-CM | POA: Insufficient documentation

## 2022-01-26 DIAGNOSIS — R42 Dizziness and giddiness: Secondary | ICD-10-CM | POA: Insufficient documentation

## 2022-01-26 DIAGNOSIS — Z79899 Other long term (current) drug therapy: Secondary | ICD-10-CM | POA: Diagnosis not present

## 2022-01-26 LAB — CBC WITH DIFFERENTIAL/PLATELET
Abs Immature Granulocytes: 0.02 10*3/uL (ref 0.00–0.07)
Basophils Absolute: 0.1 10*3/uL (ref 0.0–0.1)
Basophils Relative: 1 %
Eosinophils Absolute: 0 10*3/uL (ref 0.0–0.5)
Eosinophils Relative: 0 %
HCT: 43.1 % (ref 36.0–46.0)
Hemoglobin: 13.9 g/dL (ref 12.0–15.0)
Immature Granulocytes: 0 %
Lymphocytes Relative: 16 %
Lymphs Abs: 1 10*3/uL (ref 0.7–4.0)
MCH: 28.7 pg (ref 26.0–34.0)
MCHC: 32.3 g/dL (ref 30.0–36.0)
MCV: 88.9 fL (ref 80.0–100.0)
Monocytes Absolute: 0.6 10*3/uL (ref 0.1–1.0)
Monocytes Relative: 8 %
Neutro Abs: 4.9 10*3/uL (ref 1.7–7.7)
Neutrophils Relative %: 75 %
Platelets: 305 10*3/uL (ref 150–400)
RBC: 4.85 MIL/uL (ref 3.87–5.11)
RDW: 13 % (ref 11.5–15.5)
WBC: 6.6 10*3/uL (ref 4.0–10.5)
nRBC: 0 % (ref 0.0–0.2)

## 2022-01-26 LAB — COMPREHENSIVE METABOLIC PANEL
ALT: 16 U/L (ref 0–44)
AST: 21 U/L (ref 15–41)
Albumin: 4.5 g/dL (ref 3.5–5.0)
Alkaline Phosphatase: 85 U/L (ref 38–126)
Anion gap: 9 (ref 5–15)
BUN: 7 mg/dL — ABNORMAL LOW (ref 8–23)
CO2: 26 mmol/L (ref 22–32)
Calcium: 9.3 mg/dL (ref 8.9–10.3)
Chloride: 102 mmol/L (ref 98–111)
Creatinine, Ser: 1.02 mg/dL — ABNORMAL HIGH (ref 0.44–1.00)
GFR, Estimated: 57 mL/min — ABNORMAL LOW (ref >60)
Glucose, Bld: 108 mg/dL — ABNORMAL HIGH (ref 70–99)
Potassium: 3.5 mmol/L (ref 3.5–5.1)
Sodium: 137 mmol/L (ref 135–145)
Total Bilirubin: 0.5 mg/dL (ref 0.3–1.2)
Total Protein: 7.5 g/dL (ref 6.5–8.1)

## 2022-01-26 MED ORDER — LISINOPRIL 20 MG PO TABS: 20.0000 mg | ORAL_TABLET | Freq: Every day | ORAL | 1 refills | Status: DC

## 2022-01-26 MED ORDER — SODIUM CHLORIDE 0.9 % IV BOLUS
250.0000 mL | Freq: Once | INTRAVENOUS | Status: AC
  Administered 2022-01-26: 250 mL via INTRAVENOUS

## 2022-01-26 MED ORDER — HYDRALAZINE HCL 20 MG/ML IJ SOLN
2.0000 mg | Freq: Once | INTRAMUSCULAR | Status: AC
  Administered 2022-01-26: 2 mg via INTRAVENOUS
  Filled 2022-01-26: qty 1

## 2022-01-26 NOTE — Discharge Instructions (Signed)
Follow-up with your family doctor next week to have your blood pressure checked ?

## 2022-01-26 NOTE — ED Triage Notes (Signed)
Pt to the ED with dizziness and hypertension with a systolic greater than 200 upon waking this morning. ?

## 2022-01-26 NOTE — ED Provider Notes (Signed)
?[place] ?Provider Note ? ? ?CSN: [id number] ?Arrival date & time: 01/26/22  0705 ? ?  ? ?History ? ?Chief Complaint  ?Patient presents with  ? Dizziness  ? ? ?Kayla Terry is a 76 y.o. female. ? ?Patient states her blood pressures been very high and she has been dizzy.  Systolic has been over [id number].  Patient has a past medical history of hypertension ? ?The history is provided by the patient and medical records. No language interpreter was used.  ?Dizziness ?Quality:  Lightheadedness ?Severity:  Mild ?Onset quality:  Sudden ?Timing:  Constant ?Progression:  Waxing and waning ?Chronicity:  New ?Context: not when bending over   ?Relieved by:  Nothing ?Worsened by:  Nothing ?Ineffective treatments:  None tried ?Associated symptoms: no blood in stool, no chest pain, no diarrhea and no headaches   ? ?  ? ?Home Medications ?Prior to Admission medications   ?Medication Sig Start Date End Date Taking? Authorizing Provider  ?lisinopril (ZESTRIL) 20 MG tablet Take 1 tablet (20 mg total) by mouth daily. 01/26/22  Yes Milton Ferguson, MD  ?aspirin 81 MG EC tablet Take 1 tablet (81 mg total) by mouth daily. ?Patient not taking: Reported on 08/05/2021 04/23/21 04/23/22  Ledora Bottcher, PA  ?atorvastatin (LIPITOR) 80 MG tablet Take 1 tablet (80 mg total) by mouth daily. ?Patient not taking: Reported on 08/05/2021 05/01/21 05/01/22  Ledora Bottcher, PA  ?famotidine (PEPCID) 20 MG tablet Take 20 mg by mouth See admin instructions. Take 20 mg by mouth in the morning before breakfast and an additional 20 mg once a day as needed for heartburn    [provider]  ?fluticasone (FLOVENT HFA) 110 MCG/ACT inhaler Inhale 1 puff into the lungs 2 (two) times daily. ?Patient not taking: Reported on 08/05/2021 04/23/21   Ledora Bottcher, PA  ?gabapentin (NEURONTIN) 300 MG capsule Take 1 capsule (300 mg total) by mouth 3 (three) times daily. Take 200 mg by mouth daily at 3 PM and an additional 100 mg once a day  as needed for restless legs 10/20/21   Carmin Muskrat, MD  ?LORazepam (ATIVAN) 0.5 MG tablet Take 0.5 mg by mouth 2 (two) times daily as needed for anxiety.     [provider]  ?phenazopyridine (PYRIDIUM) 200 MG tablet Take 1 tablet (200 mg total) by mouth 3 (three) times daily. ?Patient not taking: Reported on 01/13/2022 10/20/21   Carmin Muskrat, MD  ?sucralfate (CARAFATE) 1 g tablet Take 1 tablet (1 g total) by mouth 4 (four) times daily -  with meals and at bedtime. 01/13/22   Milton Ferguson, MD  ?traZODone (DESYREL) 50 MG tablet Take 50 mg by mouth at bedtime. 01/01/22   [provider]  ?zolpidem (AMBIEN) 10 MG tablet Take 10 mg by mouth every evening.    [provider]  ?   ? ?Allergies    ?Escitalopram, Pantoprazole, and Naproxen sodium   ? ?Review of Systems   ?Review of Systems  ?Constitutional:  Negative for appetite change and fatigue.  ?HENT:  Negative for congestion, ear discharge and sinus pressure.   ?Eyes:  Negative for discharge.  ?Respiratory:  Negative for cough.   ?Cardiovascular:  Negative for chest pain.  ?Gastrointestinal:  Negative for abdominal pain, blood in stool and diarrhea.  ?Genitourinary:  Negative for frequency and hematuria.  ?Musculoskeletal:  Negative for back pain.  ?Skin:  Negative for rash.  ?Neurological:  Positive for dizziness. Negative for seizures and headaches.  ?Psychiatric/Behavioral:  Negative for hallucinations.   ? ?Physical Exam ?Updated Vital Signs ?BP (!) 173/103   Pulse 65   Temp 98.1 ?F (36.7 ?C) (Oral)   Resp (!) 22   Ht 5\' 3"  (1.6 m)   Wt 56.7 kg   SpO2 98%   BMI 22.14 kg/m?  ?Physical Exam ?Vitals and nursing note reviewed.  ?Constitutional:   ?   Appearance: She is well-developed.  ?HENT:  ?   Head: Normocephalic.  ?   Nose: Nose normal.  ?Eyes:  ?   General: No scleral icterus. ?   Conjunctiva/sclera: Conjunctivae normal.  ?Neck:  ?   Thyroid: No thyromegaly.  ?Cardiovascular:  ?   Rate and Rhythm: Normal rate and  regular rhythm.  ?   Heart sounds: No murmur heard. ?  No friction rub. No gallop.  ?Pulmonary:  ?   Breath sounds: No stridor. No wheezing or rales.  ?Chest:  ?   Chest wall: No tenderness.  ?Abdominal:  ?   General: There is no distension.  ?   Tenderness: There is no abdominal tenderness. There is no rebound.  ?Musculoskeletal:     ?   General: Normal range of motion.  ?   Cervical back: Neck supple.  ?Lymphadenopathy:  ?   Cervical: No cervical adenopathy.  ?Skin: ?   Findings: No erythema or rash.  ?Neurological:  ?   Mental Status: She is alert and oriented to person, place, and time.  ?   Motor: No abnormal muscle tone.  ?   Coordination: Coordination normal.  ?Psychiatric:     ?   Behavior: Behavior normal.  ? ? ?ED Results / Procedures / Treatments   ?Labs ?(all labs ordered are listed, but only abnormal results are displayed) ?Labs Reviewed  ?COMPREHENSIVE METABOLIC PANEL - Abnormal; Notable for the following components:  ?    Result Value  ? Glucose, Bld 108 (*)   ? BUN 7 (*)   ? Creatinine, Ser 1.02 (*)   ? GFR, Estimated 57 (*)   ? All other components within normal limits  ?CBC WITH DIFFERENTIAL/PLATELET  ? ? ?EKG ?None ? ?Radiology ?DG Chest 1 View ? ?Result Date: 01/26/2022 ?CLINICAL DATA:  Weakness and nausea this morning with hypertension EXAM: CHEST  1 VIEW COMPARISON:  Chest radiograph 01/13/2022 FINDINGS: The cardiomediastinal silhouette is stable. There is no focal consolidation or pulmonary edema. There is no pleural effusion or pneumothorax. There is no acute osseous abnormality. IMPRESSION: Stable chest with no radiographic evidence of acute cardiopulmonary process. Electronically Signed   By: Valetta Mole M.D.   On: 01/26/2022 07:56  ? ?CT Head Wo Contrast ? ?Result Date: 01/26/2022 ?CLINICAL DATA:  76 year old female with persistent dizziness. Hypertension, greater than [id number] systolic. EXAM: CT HEAD WITHOUT CONTRAST TECHNIQUE: Contiguous axial images were obtained from the base of the skull  through the vertex without intravenous contrast. RADIATION DOSE REDUCTION: This exam was performed according to the departmental dose-optimization program which includes automated exposure control, adjustment of the mA and/or kV according to patient size and/or use of iterative reconstruction technique. COMPARISON:  Brain MRI 03/06/2020. FINDINGS: Brain: Stable cerebral volume. No midline shift, ventriculomegaly, mass effect, evidence of mass lesion, intracranial hemorrhage or evidence of cortically based acute infarction. Mild for age patchy bilateral white matter hypodensity, most apparent in the right anterior limb internal capsule. No cortical encephalomalacia identified. Vascular: No suspicious intracranial vascular hyperdensity. Skull: No acute osseous abnormality identified. Sinuses/Orbits: Visualized paranasal sinuses and mastoids are clear. Tympanic  cavities are clear. Other: No acute orbit or scalp soft tissue finding. IMPRESSION: 1. No acute intracranial abnormality. 2. Mild for age white matter changes most commonly due to chronic small vessel disease. Electronically Signed   By: Genevie Ann M.D.   On: 01/26/2022 07:52   ? ?Procedures ?Procedures  ? ? ?Medications Ordered in ED ?Medications  ?sodium chloride 0.9 % bolus 250 mL (0 mLs Intravenous Stopped 01/26/22 0827)  ?hydrALAZINE (APRESOLINE) injection 2 mg (2 mg Intravenous Given 01/26/22 0827)  ? ? ?ED Course/ Medical Decision Making/ A&P ?  ?                        ?Medical Decision Making ?Amount and/or Complexity of Data Reviewed ?Labs: ordered. ?Radiology: ordered. ? ?Risk ?Prescription drug management. ? ?This patient presents to the ED for concern of dizziness, this involves an extensive number of treatment options, and is a complaint that carries with it a high risk of complications and morbidity.  The differential diagnosis includes stroke, poorly controlled blood pressure, inner ear problem ? ? ?Co morbidities that complicate the patient  evaluation ? ?Hypertension ? ? ?Additional history obtained: ? ?Additional history obtained from patient ?External records from outside source obtained and reviewed including hospital record ? ? ?Lab Tests: ? ?I Ordered, and

## 2022-02-17 ENCOUNTER — Encounter: Payer: Self-pay | Admitting: Gastroenterology

## 2022-02-17 ENCOUNTER — Ambulatory Visit: Payer: Medicare Other | Admitting: Gastroenterology

## 2022-02-17 VITALS — BP 148/82 | HR 74 | Ht 63.0 in | Wt 132.0 lb

## 2022-02-17 DIAGNOSIS — R1030 Lower abdominal pain, unspecified: Secondary | ICD-10-CM | POA: Diagnosis not present

## 2022-02-17 DIAGNOSIS — R0789 Other chest pain: Secondary | ICD-10-CM

## 2022-02-17 DIAGNOSIS — R39198 Other difficulties with micturition: Secondary | ICD-10-CM | POA: Diagnosis not present

## 2022-02-17 DIAGNOSIS — R110 Nausea: Secondary | ICD-10-CM

## 2022-02-17 MED ORDER — ONDANSETRON 4 MG PO TBDP: 4.0000 mg | ORAL_TABLET | Freq: Three times a day (TID) | ORAL | 1 refills | Status: DC | PRN

## 2022-02-17 MED ORDER — DULOXETINE HCL 30 MG PO CPEP: 30.0000 mg | ORAL_CAPSULE | Freq: Every day | ORAL | 1 refills | Status: DC

## 2022-02-17 NOTE — Progress Notes (Addendum)
? ?HPI :  ?76 year old female known to our office for chronic chest pain, has been evaluated for GERD in the past, here for reassessment of chest pain and abdominal pain. ? ?See prior clinic visits for details of her case.  She has had an EGD in the past for her symptoms in July 2021 which was normal, no esophageal pathology to account for her symptoms.  She had a significant cardiac work-up including heart monitor, echocardiogram, cardiac cath in recent years.  Not thought to have cardiac etiology to her symptoms. ? ?States she is here for follow-up visit as "no one knows what is wrong with her".  She continues to have pains middle chest that is there pretty much all of the time.  She has a sense of a soreness in her mid chest and left upper quadrant.  She states it sometimes feels as if a "pill is stuck in her chest".  She denies any pyrosis at baseline.  No regurgitation.  She denies any odynophagia.  No dysphagia.  Her symptoms are not worse with eating.  She feels nausea periodically but no vomiting.  When she lies down she symptoms feels better but generally the pain is there all the time.  She thinks feels worse than her symptoms have bothered her in the past.  This is been ongoing for years but it appears to be worse in recent months. ? ?She has been on a variety of PPIs in the past to include Prevacid, Protonix, Dexilant, Carafate, most recently Pepcid.  She states none of these have really provided any benefit to her symptoms.  Carafate caused her to have a cottonmouth and she did not tolerate it. ? ?She also endorses some chronic abdominal pain around her umbilicus and below it.  She states it feels like an "ache" and that she has the urge to pee.  She states she gets a sense of relief when she urinates however states the urge never really goes away and she always feels the need to urinate.  She has never seen a urologist.  She states her bowels are generally pretty regular.  She has no improvement in  her symptoms with a bowel movement.  No blood in her stools.  She denies any constipation.  She does endorse some discomfort in her tailbone that bothers her at times as well. ? ?I asked patient if she feels anxious.  She certainly has trouble sleeping is taking trazodone and Ambien to help with that.  She does think a lot of her symptoms started after her husband passed a few years ago and she wonders if related although she does not feel overtly anxious. ? ?She has had 5 CT scans of the abdomen pelvis since 2021 for her symptoms.  She had a CT angio of her chest abdomen pelvis.  She has had barium swallow which showed some mild dysmotility. ?Most recently she had a CT scan of her abdomen pelvis in March of this year.  She has some mild dilation of her renal pelvis and right ureter without any calculus or obstruction, no other acute pathology.  She has had some bladder wall thickening back on CT scan in December 2022. ? ?EGD 05/19/2020: ?- A 1 cm hiatal hernia was present. ?- The exam of the esophagus was otherwise normal. ?- The entire examined stomach was normal. Biopsies were taken with a cold forceps from ?antrum, incisura, body for Helicobacter pylori testing. ?- The duodenal bulb and second portion of the duodenum were  normal. Biopsies for histology ?were taken with a cold forceps for evaluation of celiac disease. ? ?1. Surgical [P], duodenum ?- BENIGN DUODENAL MUCOSA ?- NO ACUTE INFLAMMATION, VILLOUS BLUNTING OR INCREASED INTRAEPITHELIAL LYMPHOCYTES ?2. Surgical [P], gastric antrum and gastric body ?- REACTIVE GASTROPATHY ?- NO H. PYLORI OR INTESTINAL METAPLASIA IDENTIFIED ?- SEE COMMENT ? ? ?Colonoscopy 03/03/2014 - diverticulosis / hemorrhoids - no polyps ? ? ?Echocardiogram 04/22/21 - EF 55-60%, grade I DD ? ?Cardiac cath 04/22/21 - 1. Moderate non-obstructive disease in the mid LAD ?2. Mild non-obstructive disease in the Circumflex and dominant RCA ?3. Normal LV systolic function ?4. Normal LV filling  pressure.  ? ? ?CT abdomen / pelvis 01/13/22 - IMPRESSION: ?1. No acute findings or explanation for the patient's symptoms. ?Nonspecific trace pelvic ascites. ?2. Stable mild dilatation of both renal pelves and the right ureter ?without evidence of urinary tract calculus or ureteral obstruction. ?3. Distal colonic diverticulosis without evidence of acute ?inflammation. ?4.  Aortic Atherosclerosis (ICD10-I70.0). ?  ? ? ?Past Medical History:  ?Diagnosis Date  ? Allergy   ? B12 deficiency 04/2020  ? Cataract   ? Coronary artery disease   ? COVID-19 04/22/2021  ? Fatty liver   ? GERD (gastroesophageal reflux disease)   ? Herpes zoster without complications   ? Hiatal hernia   ? Hypertension   ? Mild mitral regurgitation 2013  ? Mobitz type 2 second degree atrioventricular block   ? Pectus excavatum   ? pectus excavatum deformity resulting in mass effect on the RV by CT 03/2020  ? Pulmonary nodule   ? Shingles   ? ? ? ?Past Surgical History:  ?Procedure Laterality Date  ? ABDOMINAL HYSTERECTOMY  02/1984  ? BACK SURGERY    ? CATARACT EXTRACTION, BILATERAL    ? LEFT HEART CATH AND CORONARY ANGIOGRAPHY N/A 04/22/2021  ? Procedure: LEFT HEART CATH AND CORONARY ANGIOGRAPHY;  Surgeon: Burnell Blanks, MD;  Location: [facility];  Service: Cardiovascular;  Laterality: N/A;  ? LUMBAR LAMINECTOMY    ? OOPHORECTOMY  02/29/1984  ? unilateral  ? ?Family History  ?Problem Relation Age of Onset  ? Cancer Mother   ? Leukemia Mother   ? Lung cancer Father   ? Multiple myeloma Father   ? Multiple sclerosis Sister   ? Alcohol abuse Brother   ? Multiple myeloma Brother   ? Colon cancer Neg Hx   ? Colon polyps Neg Hx   ? Stomach cancer Neg Hx   ? Esophageal cancer Neg Hx   ? ?Social History  ? ?Tobacco Use  ? Smoking status: Never  ? Smokeless tobacco: Never  ?Vaping Use  ? Vaping Use: Never used  ?Substance Use Topics  ? Alcohol use: No  ? Drug use: No  ? ?Current Outpatient Medications  ?Medication Sig Dispense Refill  ?  DULoxetine (CYMBALTA) 30 MG capsule Take 1 capsule (30 mg total) by mouth daily. 60 capsule 1  ? famotidine (PEPCID) 20 MG tablet Take 20 mg by mouth See admin instructions. Take 20 mg by mouth in the morning before breakfast and an additional 20 mg once a day as needed for heartburn    ? gabapentin (NEURONTIN) 300 MG capsule Take 1 capsule (300 mg total) by mouth 3 (three) times daily. Take 200 mg by mouth daily at 3 PM and an additional 100 mg once a day as needed for restless legs 90 capsule 0  ? LORazepam (ATIVAN) 0.5 MG tablet Take 0.5 mg by  mouth 2 (two) times daily as needed for anxiety.     ? ondansetron (ZOFRAN-ODT) 4 MG disintegrating tablet Take 1 tablet (4 mg total) by mouth every 8 (eight) hours as needed for nausea or vomiting. 30 tablet 1  ? traZODone (DESYREL) 50 MG tablet Take 50 mg by mouth at bedtime.    ? zolpidem (AMBIEN) 10 MG tablet Take 10 mg by mouth every evening.    ? ?No current facility-administered medications for this visit.  ? ?Allergies  ?Allergen Reactions  ? Escitalopram Nausea Only and Other (See Comments)  ?  Nausea and dizziness  ? Pantoprazole Other (See Comments)  ?  Severe headaches ?  ? Naproxen Sodium Anxiety  ? ? ? ?Review of Systems: ?All systems reviewed and negative except where noted in HPI.  ? ? ?DG Chest 1 View ? ?Result Date: 01/26/2022 ?CLINICAL DATA:  Weakness and nausea this morning with hypertension EXAM: CHEST  1 VIEW COMPARISON:  Chest radiograph 01/13/2022 FINDINGS: The cardiomediastinal silhouette is stable. There is no focal consolidation or pulmonary edema. There is no pleural effusion or pneumothorax. There is no acute osseous abnormality. IMPRESSION: Stable chest with no radiographic evidence of acute cardiopulmonary process. Electronically Signed   By: Valetta Mole M.D.   On: 01/26/2022 07:56  ? ?CT Head Wo Contrast ? ?Result Date: 01/26/2022 ?CLINICAL DATA:  76 year old female with persistent dizziness. Hypertension, greater than 883 systolic. EXAM: CT  HEAD WITHOUT CONTRAST TECHNIQUE: Contiguous axial images were obtained from the base of the skull through the vertex without intravenous contrast. RADIATION DOSE REDUCTION: This exam was performed according to the

## 2022-02-17 NOTE — Patient Instructions (Addendum)
If you are age 75 or older, your body mass index should be between 23-30. Your Body mass index is 23.38 kg/m?Marland Kitchen If this is out of the aforementioned range listed, please consider follow up with your Primary Care Provider. ? ?If you are age 50 or younger, your body mass index should be between 19-25. Your Body mass index is 23.38 kg/m?Marland Kitchen If this is out of the aformentioned range listed, please consider follow up with your Primary Care Provider.  ? ?________________________________________________________ ? ?The [facility] providers would like to encourage you to use [facility] to communicate with providers for non-urgent requests or questions.  Due to long hold times on the telephone, sending your provider a message by [facility] may be a faster and more efficient way to get a response.  Please allow 48 business hours for a response.  Please remember that this is for non-urgent requests.  ?_______________________________________________________ ? ?We are referring you to Urology.  They will contact you directly to schedule an appointment.  It may take a week or more before you hear from them.  Please feel free to contact us if you have not heard from them within 2 weeks and we will follow up on the referral.   ? ?We have sent the following medications to your pharmacy for you to pick up at your convenience: ?Cymbalta 30 mg: Take once daily (Please discuss taking this with Dr. Andrey Campanile in light of the fact you are on trazadone) ? ?Zofran 4 mg ODT: Dissolve 1 tablet orally every 8 hours as needed for nausea ? ?Please give [place] an update in a month. ? ?Thank you for entrusting me with your care and for choosing [facility], ?Dr. Ileene Patrick ? ? ?

## 2022-02-22 ENCOUNTER — Ambulatory Visit: Payer: Medicare Other | Admitting: Gastroenterology

## 2022-03-04 ENCOUNTER — Emergency Department (HOSPITAL_COMMUNITY)
Admission: EM | Admit: 2022-03-04 | Discharge: 2022-03-04 | Disposition: A | Discharge status: Home / Self Care | Payer: Medicare Other | Attending: Student | Admitting: Student

## 2022-03-04 ENCOUNTER — Encounter (HOSPITAL_COMMUNITY): Payer: Self-pay

## 2022-03-04 ENCOUNTER — Emergency Department (HOSPITAL_COMMUNITY): Payer: Medicare Other

## 2022-03-04 ENCOUNTER — Other Ambulatory Visit: Payer: Self-pay

## 2022-03-04 DIAGNOSIS — I2510 Atherosclerotic heart disease of native coronary artery without angina pectoris: Secondary | ICD-10-CM | POA: Insufficient documentation

## 2022-03-04 DIAGNOSIS — G8929 Other chronic pain: Secondary | ICD-10-CM | POA: Diagnosis not present

## 2022-03-04 DIAGNOSIS — R3915 Urgency of urination: Secondary | ICD-10-CM | POA: Diagnosis not present

## 2022-03-04 DIAGNOSIS — R350 Frequency of micturition: Secondary | ICD-10-CM

## 2022-03-04 DIAGNOSIS — R079 Chest pain, unspecified: Secondary | ICD-10-CM

## 2022-03-04 DIAGNOSIS — R109 Unspecified abdominal pain: Secondary | ICD-10-CM | POA: Diagnosis not present

## 2022-03-04 DIAGNOSIS — R0602 Shortness of breath: Secondary | ICD-10-CM | POA: Insufficient documentation

## 2022-03-04 DIAGNOSIS — I10 Essential (primary) hypertension: Secondary | ICD-10-CM | POA: Insufficient documentation

## 2022-03-04 DIAGNOSIS — R519 Headache, unspecified: Secondary | ICD-10-CM | POA: Insufficient documentation

## 2022-03-04 LAB — COMPREHENSIVE METABOLIC PANEL
ALT: 14 U/L (ref 0–44)
AST: 18 U/L (ref 15–41)
Albumin: 4 g/dL (ref 3.5–5.0)
Alkaline Phosphatase: 92 U/L (ref 38–126)
Anion gap: 9 (ref 5–15)
BUN: 7 mg/dL — ABNORMAL LOW (ref 8–23)
CO2: 25 mmol/L (ref 22–32)
Calcium: 9.4 mg/dL (ref 8.9–10.3)
Chloride: 104 mmol/L (ref 98–111)
Creatinine, Ser: 1 mg/dL (ref 0.44–1.00)
GFR, Estimated: 59 mL/min — ABNORMAL LOW (ref >60)
Glucose, Bld: 108 mg/dL — ABNORMAL HIGH (ref 70–99)
Potassium: 3.9 mmol/L (ref 3.5–5.1)
Sodium: 138 mmol/L (ref 135–145)
Total Bilirubin: 0.5 mg/dL (ref 0.3–1.2)
Total Protein: 7.2 g/dL (ref 6.5–8.1)

## 2022-03-04 LAB — TROPONIN I (HIGH SENSITIVITY)
Troponin I (High Sensitivity): 3 ng/L (ref <18)
Troponin I (High Sensitivity): 3 ng/L (ref <18)

## 2022-03-04 LAB — URINALYSIS, ROUTINE W REFLEX MICROSCOPIC
Bacteria, UA: NONE SEEN
Bilirubin Urine: NEGATIVE
Glucose, UA: NEGATIVE mg/dL
Hgb urine dipstick: NEGATIVE
Ketones, ur: NEGATIVE mg/dL
Nitrite: NEGATIVE
Protein, ur: NEGATIVE mg/dL
Specific Gravity, Urine: 1.002 — ABNORMAL LOW (ref 1.005–1.030)
pH: 7 (ref 5.0–8.0)

## 2022-03-04 LAB — CBC
HCT: 41.4 % (ref 36.0–46.0)
Hemoglobin: 13.6 g/dL (ref 12.0–15.0)
MCH: 29.2 pg (ref 26.0–34.0)
MCHC: 32.9 g/dL (ref 30.0–36.0)
MCV: 88.8 fL (ref 80.0–100.0)
Platelets: 258 10*3/uL (ref 150–400)
RBC: 4.66 MIL/uL (ref 3.87–5.11)
RDW: 13.2 % (ref 11.5–15.5)
WBC: 6.5 10*3/uL (ref 4.0–10.5)
nRBC: 0 % (ref 0.0–0.2)

## 2022-03-04 LAB — BRAIN NATRIURETIC PEPTIDE: B Natriuretic Peptide: 69 pg/mL (ref 0.0–100.0)

## 2022-03-04 MED ORDER — DIPHENHYDRAMINE HCL 50 MG/ML IJ SOLN
25.0000 mg | Freq: Once | INTRAMUSCULAR | Status: DC
  Filled 2022-03-04: qty 1

## 2022-03-04 MED ORDER — LACTATED RINGERS IV BOLUS
1000.0000 mL | Freq: Once | INTRAVENOUS | Status: AC
  Administered 2022-03-04: 1000 mL via INTRAVENOUS

## 2022-03-04 MED ORDER — PROCHLORPERAZINE EDISYLATE 10 MG/2ML IJ SOLN
10.0000 mg | Freq: Once | INTRAMUSCULAR | Status: DC
  Filled 2022-03-04: qty 2

## 2022-03-04 NOTE — Discharge Instructions (Addendum)
You were seen in the ER today for evaluation of your chronic chest pain and your urinary urgency/frequency. Your urine did not show any signs of a UTI, however, we are culturing it to make sure there is no infection. I see that your GI provider referred you to a urologist. I have attached another urologist to this paperwork in case you have lost your referral or have not heard back. Please call to schedule an appointment. Please follow up with your cardiologist for this chronic chest pain you are having. Make sure to bring the log of your blood pressure recordings to your appointment. Your blood pressure is elevated today and you will need to go home and take the blood pressure medication that was prescribed to you. If you have any concern, new or worsening symptoms, please return to the nearest ER for evaluation.  ? ?Contact a doctor if: ?Your chest pain does not go away. ?You feel depressed. ?You have a fever. ?Get help right away if: ?Your chest pain is worse. ?You have a cough that gets worse, or you cough up blood. ?You have very bad (severe) pain in your belly (abdomen). ?You pass out (faint). ?You have either of these for no clear reason: ?Sudden chest discomfort. ?Sudden discomfort in your arms, back, neck, or jaw. ?You have shortness of breath at any time. ?You suddenly start to sweat, or your skin gets clammy. ?You feel sick to your stomach (nauseous). ?You throw up (vomit). ?You suddenly feel lightheaded or dizzy. ?You feel very weak or tired. ?Your heart starts to beat fast, or it feels like it is skipping beats. ?These symptoms may be an emergency. Do not wait to see if the symptoms will go away. Get medical help right away. Call your local emergency services (911 in the [place] Do not drive yourself to the hospital. ?

## 2022-03-04 NOTE — ED Provider Notes (Signed)
?[facility] ?Provider Note ? ? ?CSN: [id number] ?Arrival date & time: 03/04/22  [id number] ? ?  ? ?History ?Chief Complaint  ?Patient presents with  ? Chest Pain  ? ? ?Kayla Terry is a 76 y.o. female with history of CAD, GERD, hypertension, hyperlipidemia, chest pain of uncertain etiology presents the emergency department for evaluation of multiple complaints.  She is complaining of abdominal pain, chest pain, shortness of breath, headaches, urinary urgency, urinary frequency, tailbone pain, unintentional 40 pound weight loss in over a year, all that have been happening for the past few months to years.  Patient reports she was still concerned about her lower abdominal stinging pain which she reports feels like "bees stinging her in her lower belly".  She reports that she is having some urinary frequency and urgency associated with this, but denies any dysuria or hematuria.  She reports that she has had this lower abdominal pain going on for months to years.  Additionally, she mentions she has been having chest pain that she seems to be getting more frequent.  She reports its not really painful is more of a soreness in her chest.  She reports some occasional shortness of breath but denies any orthopnea.  She does have some lightheadedness, but that is been going on for months.  She has addressed all these issues with specialties and her PCP.  The patient reports that the symptoms are stable and have not worsened recently.  She has not been taking her high blood pressure medication as she reports her primary care doctor took her off of it because of her lower blood pressure. ? ? ?Chest Pain ?Associated symptoms: abdominal pain and headache   ?Associated symptoms: no back pain, no cough, no fatigue, no nausea, no numbness, no shortness of breath, no vomiting and no weakness   ? ?  ? ?Home Medications ?Prior to Admission medications   ?Medication Sig Start Date End Date Taking? Authorizing Provider   ?DULoxetine (CYMBALTA) 30 MG capsule Take 1 capsule (30 mg total) by mouth daily. 02/17/22   Armbruster, Willaim Rayas, MD  ?famotidine (PEPCID) 20 MG tablet Take 20 mg by mouth See admin instructions. Take 20 mg by mouth in the morning before breakfast and an additional 20 mg once a day as needed for heartburn    [provider]  ?gabapentin (NEURONTIN) 100 MG capsule Take 1-3 capsules by mouth in the morning and at bedtime. 1 capsule in the morning and 2 capsules at bedtime 02/15/22   [provider]  ?gabapentin (NEURONTIN) 300 MG capsule Take 1 capsule (300 mg total) by mouth 3 (three) times daily. Take 200 mg by mouth daily at 3 PM and an additional 100 mg once a day as needed for restless legs 10/20/21   Gerhard Munch, MD  ?LORazepam (ATIVAN) 0.5 MG tablet Take 0.5 mg by mouth 2 (two) times daily as needed for anxiety.     [provider]  ?ondansetron (ZOFRAN-ODT) 4 MG disintegrating tablet Take 1 tablet (4 mg total) by mouth every 8 (eight) hours as needed for nausea or vomiting. 02/17/22   Armbruster, Willaim Rayas, MD  ?traZODone (DESYREL) 100 MG tablet Take 100 mg by mouth at bedtime. 03/02/22   [provider]  ?zolpidem (AMBIEN) 10 MG tablet Take 10 mg by mouth every evening.    [provider]  ?   ? ?Allergies    ?Escitalopram, Pantoprazole, and Naproxen sodium   ? ?Review of Systems   ?Review  of Systems  ?Constitutional:  Negative for chills and fatigue.  ?Respiratory:  Negative for cough and shortness of breath.   ?Cardiovascular:  Positive for chest pain.  ?Gastrointestinal:  Positive for abdominal pain. Negative for constipation, diarrhea, nausea and vomiting.  ?Genitourinary:  Positive for frequency and urgency. Negative for dysuria and hematuria.  ?Musculoskeletal:  Negative for back pain and neck pain.  ?Neurological:  Positive for light-headedness and headaches. Negative for syncope, weakness and numbness.  ? ?Physical Exam ?Updated Vital Signs ?BP (!) 172/107    Pulse 73   Temp 97.6 ?F (36.4 ?C) (Oral)   Resp 16   Ht 5\' 3"  (1.6 m)   Wt 56.7 kg   SpO2 96%   BMI 22.14 kg/m?  ?Physical Exam ?Vitals and nursing note reviewed.  ?Constitutional:   ?   Appearance: Normal appearance.  ?HENT:  ?   Head: Normocephalic and atraumatic.  ?   Comments: Scalp and face nontender to palpation ?   Mouth/Throat:  ?   Mouth: Mucous membranes are moist.  ?   Pharynx: No oropharyngeal exudate.  ?Eyes:  ?   General: No scleral icterus. ?   Extraocular Movements: Extraocular movements intact.  ?   Pupils: Pupils are equal, round, and reactive to light.  ?Cardiovascular:  ?   Rate and Rhythm: Normal rate and regular rhythm.  ?   Pulses:     ?     Radial pulses are 2+ on the right side and 2+ on the left side.  ?     Dorsalis pedis pulses are 2+ on the right side and 2+ on the left side.  ?     Posterior tibial pulses are 2+ on the right side and 2+ on the left side.  ?Pulmonary:  ?   Effort: Pulmonary effort is normal. No respiratory distress.  ?   Breath sounds: Normal breath sounds. No decreased breath sounds.  ?   Comments: Clear to auscultation bilaterally.  Patient is speaking in full sentences with ease.  No respiratory distress, accessory muscle use, nasal flaring, tripoding, or cyanosis present. ?Chest:  ?   Chest wall: No tenderness or crepitus.  ?Abdominal:  ?   General: Abdomen is flat. Bowel sounds are normal.  ?   Palpations: Abdomen is soft.  ?   Tenderness: There is no abdominal tenderness. There is no guarding or rebound.  ?   Comments: No abdominal tenderness palpation, patient mentions some urinary urgency with palpation.  Abdomen is soft with normal active bowel sounds.  No overlying skin changes, rashes, abrasions, bruising noted.  No CVA tenderness.  ?Musculoskeletal:     ?   General: No deformity.  ?   Cervical back: Normal range of motion.  ?   Right lower leg: No edema.  ?   Left lower leg: No edema.  ?   Comments: No midline or paraspinal cervical, thoracic, or  lumbar tenderness palpation.  No overlying erythema or warmth.  No overlying skin changes noted.  Patient has a bony tailbone which she reports causes her pain whenever she is sitting down for extended amounts of time.  Nontender to palpation.  There is no overlying skin change, erythema, or warmth.  No trauma noted.  No swelling noted.  ?Skin: ?   General: Skin is warm and dry.  ?Neurological:  ?   General: No focal deficit present.  ?   Mental Status: She is alert. Mental status is at baseline.  ?   GCS:  GCS eye subscore is 4. GCS verbal subscore is 5. GCS motor subscore is 6.  ?   Cranial Nerves: Cranial nerves 2-12 are intact. No dysarthria or facial asymmetry.  ?   Sensory: No sensory deficit.  ?   Motor: No weakness or pronator drift.  ?   Coordination: Coordination normal. Finger-Nose-Finger Test and Heel to LemmonShin Test normal.  ?   Comments: Alert and oriented x3.  GCS 15.  Cranial nerves II through XII intact.  She is answering questions appropriate with appropriate speech.  No facial asymmetry visualized.  Sensations intact throughout.  Strength is 5 out of 5 in upper and lower bilateral extremities.  She does not have a pronator drift.  She has normal coordination.  Normal finger-nose.  Normal heel-to-shin.  She is ambulatory without assistance.  ? ? ?ED Results / Procedures / Treatments   ?Labs ?(all labs ordered are listed, but only abnormal results are displayed) ?Labs Reviewed  ?COMPREHENSIVE METABOLIC PANEL - Abnormal; Notable for the following components:  ?    Result Value  ? Glucose, Bld 108 (*)   ? BUN 7 (*)   ? GFR, Estimated 59 (*)   ? All other components within normal limits  ?CBC  ?BRAIN NATRIURETIC PEPTIDE  ?URINALYSIS, ROUTINE W REFLEX MICROSCOPIC  ?TROPONIN I (HIGH SENSITIVITY)  ?TROPONIN I (HIGH SENSITIVITY)  ? ? ?EKG ?EKG Interpretation ? ?Date/Time:  Friday Mar 04 2022 07:42:04 EDT ?Ventricular Rate:  78 ?PR Interval:  208 ?QRS Duration: 122 ?QT Interval:  396 ?QTC Calculation: 451 ?R  Axis:   -85 ?Text Interpretation: Normal sinus rhythm Left bundle branch block No significant change since last tracing When compared with ECG of 26-Jan-2022 07:22, PREVIOUS ECG IS PRESENT Confirmed by Kommor, Madis

## 2022-03-04 NOTE — ED Triage Notes (Signed)
Pt presents to ED with complaints of mid chest pain and lower abdominal pain feels like a stinging started yesterday. Pt states she has also had headache, dizziness, and shortness of breath ?

## 2022-03-06 LAB — URINE CULTURE

## 2022-03-25 ENCOUNTER — Other Ambulatory Visit: Payer: Self-pay

## 2022-03-25 ENCOUNTER — Emergency Department (HOSPITAL_COMMUNITY): Payer: Medicare Other

## 2022-03-25 ENCOUNTER — Emergency Department (HOSPITAL_COMMUNITY)
Admission: EM | Admit: 2022-03-25 | Discharge: 2022-03-25 | Disposition: A | Discharge status: Home / Self Care | Payer: Medicare Other | Attending: Emergency Medicine | Admitting: Emergency Medicine

## 2022-03-25 ENCOUNTER — Encounter (HOSPITAL_COMMUNITY): Payer: Self-pay | Admitting: Emergency Medicine

## 2022-03-25 DIAGNOSIS — R0789 Other chest pain: Secondary | ICD-10-CM | POA: Insufficient documentation

## 2022-03-25 DIAGNOSIS — R1084 Generalized abdominal pain: Secondary | ICD-10-CM | POA: Diagnosis not present

## 2022-03-25 DIAGNOSIS — G8929 Other chronic pain: Secondary | ICD-10-CM

## 2022-03-25 DIAGNOSIS — R079 Chest pain, unspecified: Secondary | ICD-10-CM | POA: Diagnosis present

## 2022-03-25 LAB — BASIC METABOLIC PANEL
Anion gap: 7 (ref 5–15)
BUN: 7 mg/dL — ABNORMAL LOW (ref 8–23)
CO2: 28 mmol/L (ref 22–32)
Calcium: 9.4 mg/dL (ref 8.9–10.3)
Chloride: 102 mmol/L (ref 98–111)
Creatinine, Ser: 0.91 mg/dL (ref 0.44–1.00)
GFR, Estimated: >60 mL/min (ref >60)
Glucose, Bld: 106 mg/dL — ABNORMAL HIGH (ref 70–99)
Potassium: 3.8 mmol/L (ref 3.5–5.1)
Sodium: 137 mmol/L (ref 135–145)

## 2022-03-25 LAB — CBC
HCT: 41.5 % (ref 36.0–46.0)
Hemoglobin: 13.8 g/dL (ref 12.0–15.0)
MCH: 29.5 pg (ref 26.0–34.0)
MCHC: 33.3 g/dL (ref 30.0–36.0)
MCV: 88.7 fL (ref 80.0–100.0)
Platelets: 298 10*3/uL (ref 150–400)
RBC: 4.68 MIL/uL (ref 3.87–5.11)
RDW: 13.2 % (ref 11.5–15.5)
WBC: 5.6 10*3/uL (ref 4.0–10.5)
nRBC: 0 % (ref 0.0–0.2)

## 2022-03-25 LAB — TROPONIN I (HIGH SENSITIVITY)
Troponin I (High Sensitivity): 3 ng/L (ref <18)
Troponin I (High Sensitivity): 3 ng/L (ref <18)

## 2022-03-25 NOTE — Discharge Instructions (Signed)
All the results in the ER are normal.   We are not sure what is causing your symptoms.  I suspect that primary care doctor might be the best person to assist you in figuring out the root cause for your pain.  It is also possible that we might not be able to figure out the root cause, but at least you will have reassurance that there is nothing concerning.  Please follow-up with your primary care doctor in 1 week.  Please return to the ER if your symptoms worsen; you have increased pain, fevers, chills, inability to keep any medications down, confusion.

## 2022-03-25 NOTE — ED Triage Notes (Signed)
Pt to the ED with  chest pain and abdominal pain that radiates around to her back for the last several weeks.   Pt was seen here 03/04/22 for the same and she states it has gotten continuously worse.  Pt also states she has been having N/V.

## 2022-03-25 NOTE — ED Provider Notes (Signed)
[facility] EMERGENCY DEPARTMENT Provider Note   CSN: [id number] Arrival date & time: 03/25/22  2248     History  Chief Complaint  Patient presents with   Chest Pain    Kayla Terry is a 76 y.o. female.  HPI    76 year old female comes in with chief complaint of chest pain, abdominal pain.  Patient indicates that her abdominal pain has been present now for several months.  Chest pain also has been present for several months.  The pain is in the epigastric region and upper part of her chest.  Occasionally the pain radiates to the back.  This pain is intermittent, but no specific triggers.  There is no specific aggravation of pain with p.o. intake.  Review of system is negative for any vomiting, but patient has reduced appetite and indicates that she has had about 40 pound weight loss.  She has seen GI in the past and had upper endoscopies which were reassuring.  Patient is taking Pepcid at the request of her GI doctor.  She denies any bloody stools.  Patient has no exertional component to the chest pain.  Home Medications Prior to Admission medications   Medication Sig Start Date End Date Taking? Authorizing Provider  DULoxetine (CYMBALTA) 30 MG capsule Take 1 capsule (30 mg total) by mouth daily. Patient not taking: Reported on 03/04/2022 02/17/22   Benancio Deeds, MD  famotidine (PEPCID) 20 MG tablet Take 20 mg by mouth daily.    [provider]  gabapentin (NEURONTIN) 100 MG capsule Take 1-3 capsules by mouth 2 (two) times daily as needed. 1 capsule in the morning and 2 capsules in afternoon as needed for leg pain 02/15/22   [provider]  gabapentin (NEURONTIN) 300 MG capsule Take 1 capsule (300 mg total) by mouth 3 (three) times daily. Take 200 mg by mouth daily at 3 PM and an additional 100 mg once a day as needed for restless legs Patient not taking: Reported on 03/04/2022 10/20/21   Gerhard Munch, MD  LORazepam (ATIVAN) 0.5 MG tablet Take 0.5 mg by mouth  2 (two) times daily as needed for anxiety.     [provider]  ondansetron (ZOFRAN-ODT) 4 MG disintegrating tablet Take 1 tablet (4 mg total) by mouth every 8 (eight) hours as needed for nausea or vomiting. Patient not taking: Reported on 03/04/2022 02/17/22   Armbruster, Willaim Rayas, MD  Polyethyl Glycol-Propyl Glycol (SYSTANE OP) Place 1 drop into both eyes 2 (two) times daily as needed (itching).    [provider]  traZODone (DESYREL) 100 MG tablet Take 100 mg by mouth at bedtime. 03/02/22   [provider]  zolpidem (AMBIEN) 10 MG tablet Take 10 mg by mouth every evening.    [provider]      Allergies    Escitalopram, Pantoprazole, and Naproxen sodium    Review of Systems   Review of Systems  Physical Exam Updated Vital Signs BP (!) 169/100   Pulse 65   Temp 98 F (36.7 C) (Oral)   Resp 14   Ht 5\' 3"  (1.6 m)   Wt 56.7 kg   SpO2 99%   BMI 22.14 kg/m  Physical Exam Vitals and nursing note reviewed.  Constitutional:      Appearance: She is well-developed.  HENT:     Head: Atraumatic.  Cardiovascular:     Rate and Rhythm: Normal rate.     Heart sounds: Normal heart sounds.  Pulmonary:  Effort: Pulmonary effort is normal.  Abdominal:     Palpations: Abdomen is soft.     Tenderness: There is no guarding.  Musculoskeletal:     Cervical back: Normal range of motion and neck supple.  Skin:    General: Skin is warm and dry.  Neurological:     Mental Status: She is alert and oriented to person, place, and time.    ED Results / Procedures / Treatments   Labs (all labs ordered are listed, but only abnormal results are displayed) Labs Reviewed  BASIC METABOLIC PANEL - Abnormal; Notable for the following components:      Result Value   Glucose, Bld 106 (*)    BUN 7 (*)    All other components within normal limits  CBC  TROPONIN I (HIGH SENSITIVITY)  TROPONIN I (HIGH SENSITIVITY)    EKG EKG Interpretation  Date/Time:  Friday  Mar 25 2022 07:43:41 EDT Ventricular Rate:  64 PR Interval:  207 QRS Duration: 133 QT Interval:  464 QTC Calculation: 479 R Axis:   -72 Text Interpretation: Sinus rhythm Left bundle branch block No acute changes No significant change since last tracing Confirmed by Derwood Kaplan ([id number]) on 03/25/2022 8:37:30 AM  Radiology DG Chest 2 View  Result Date: 03/25/2022 CLINICAL DATA:  Chest pain. EXAM: CHEST - 2 VIEW COMPARISON:  Mar 04, 2022. FINDINGS: The heart size and mediastinal contours are within normal limits. Both lungs are clear. The visualized skeletal structures are unremarkable. IMPRESSION: No active cardiopulmonary disease. Electronically Signed   By: Lupita Raider M.D.   On: 03/25/2022 08:07    Procedures Procedures    Medications Ordered in ED Medications - No data to display  ED Course/ Medical Decision Making/ A&P                           Medical Decision Making Amount and/or Complexity of Data Reviewed Labs: ordered. Radiology: ordered.   This patient presents to the ED with chief complaint(s) of abdominal pain and chest pain with pertinent past medical history of chronic abdominal pain and GERD which further complicates the presenting complaint. The complaint involves an extensive differential diagnosis and also carries with it a high risk of complications and morbidity.    The differential diagnosis includes : ACS, esophageal spasms, intestinal spasms, GI dysmotility, biliary dyskinesia, cancer.  The initial plan is to order basic labs including troponin.  EKG has also been ordered   Additional history obtained: Records reviewed Primary Care Documents and 3 CT scans that were completed in the last 6 months involving patient's abdomen , CT dissection study that was done a year ago.  Independent labs interpretation:  The following labs were independently interpreted: Normal CBC, normal troponin, normal metabolic profile  Consideration for admission or  further workup: Considered repeat CT scan.  However the lab work-up looks reassuring.  Patient does not have peritoneal findings and the pain has been present now for several weeks, making acute pathology unlikely.  We have recommended that patient follow-up with PCP for further work-up.  Patient understands the rationale for not doing CT scan here and is willing to follow-up with her PCP and GI doctor  Final Clinical Impression(s) / ED Diagnoses Final diagnoses:  Chronic abdominal pain  Chronic chest pain    Rx / DC Orders ED Discharge Orders     None         Derwood Kaplan, MD 03/25/22 1137

## 2022-04-01 ENCOUNTER — Telehealth: Payer: Self-pay

## 2022-04-01 NOTE — Telephone Encounter (Signed)
Called [facility] at [phone]. Confirmed they received our referral and it was put in May 5th. They have not scheduled her.  They will call her to schedule and let us know.

## 2022-04-01 NOTE — Telephone Encounter (Signed)
[facility] called back and let us know that they have called the patient and left a message to call back to be scheduled for an appt

## 2022-04-06 ENCOUNTER — Encounter: Payer: Self-pay | Admitting: Neurology

## 2022-04-06 ENCOUNTER — Ambulatory Visit: Payer: Medicare Other | Admitting: Neurology

## 2022-04-06 VITALS — BP 144/98 | HR 85 | Ht 63.0 in | Wt 131.6 lb

## 2022-04-06 DIAGNOSIS — R531 Weakness: Secondary | ICD-10-CM

## 2022-04-06 DIAGNOSIS — M4712 Other spondylosis with myelopathy, cervical region: Secondary | ICD-10-CM | POA: Diagnosis not present

## 2022-04-06 DIAGNOSIS — E538 Deficiency of other specified B group vitamins: Secondary | ICD-10-CM

## 2022-04-06 DIAGNOSIS — R2990 Unspecified symptoms and signs involving the nervous system: Secondary | ICD-10-CM

## 2022-04-06 DIAGNOSIS — R258 Other abnormal involuntary movements: Secondary | ICD-10-CM

## 2022-04-06 DIAGNOSIS — R2689 Other abnormalities of gait and mobility: Secondary | ICD-10-CM | POA: Diagnosis not present

## 2022-04-06 DIAGNOSIS — M4722 Other spondylosis with radiculopathy, cervical region: Secondary | ICD-10-CM

## 2022-04-06 NOTE — Progress Notes (Signed)
[facility]    Provider:  Dr Jaynee Eagles Primary Care Provider:  Christain Sacramento, MD  CC: arm and leg pain  04/06/2022: This is a patient who we have seen in the past for similar symptoms for weakness and pain in the limbs.  At the last time we saw her we really did not have any more neurologically to offer her, we imaged her MRI brain(unremarkable) and cervical spine(moderate to severe stenosis) and had an EMG nerve conduction study(normal).  MRI of the cervical spine showed central stenosis and we sent her to Dr. Vertell Limber, he recommended surgery and she declined, we also diagnosed her with a B12 deficiency. She has similar symptoms, her upper arms and legs feel weak, she has not fallen, she feels weak. She is not taking her B12 supplements. Her back hurts just started yesyerday. No double vision or problems swallowing. No ptosis, no bulbar symptoms. Her friend is here and provides much information.  05/2020: MRI cervical spine: IMPRESSION: personally reviewed images and reviewed them wtith patient 1.  Cervical spondylosis and facet arthrosis as described above. This is most notable for moderate to severe spinal canal and severe RIGHT and moderate LEFT neuroforaminal stenosis at C6-C7.  2.  Mild to moderate spinal canal narrowing and severe bilateral neuroforaminal stenosis at C5-C6.  3.  Moderate LEFT and mild RIGHT neuroforaminal stenosis and mild spinal canal narrowing at C4-C5.  05/2020 MRI brain: IMPRESSION:  1.  No acute intracranial abnormality.  2.  Few small discrete white matter lesions are identified. These lesions are abnormal but nonspecific, usually resulting from benign/remote/incidental causes (e.g. prior trauma/inflammation/demyelinization, or chronic ischemia associated with migraines/atherosclerosis/other vasculopathies). Favor chronic microvascular disease.   Patient complains of symptoms per HPI as well as the following symptoms: imbalance . Pertinent negatives and  positives per HPI. All others negative   HPI 03/03/2021: Patient seen in the past for headaches and weakness. Today seen for weakness, stable, she feels cold all the time and has had weight loss and I apologized and explained that she should really see her primary care about this, not sure I have anymore neurologically to offer. I have seen her in the past for stable weakness, and she has had MRI brain, cervical spine and emg/ncs. 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 She saw Dr. Vertell Limber and he recommended surgery. She didn't have it completed, she declined.  She states no new symptoms, no of the same symptoms, she has been to different doctors since she saw me. She stays cold and she is "jittery" nervous (TSH is normal). She saw cardiology and she was evaluated and she has further evaluation on June 1st.   Patient complains of symptoms per HPI as well as the following symptoms: weight loss, feels cold. . Pertinent negatives and positives per HPI. All others negative   HPI:  Kayla Terry is a 76 y.o. female here as requested by Irene Pap DO for worsening headache.  She has a past medical history of GERD, herpes zoster without complications, hypertension,chronic pain, hyperlipidemia, restless leg syndrome, nocturia, chest tightness, dizziness.  I reviewed Irene Pap DO's notes: Patient was last seen Mar 12, 2020, she described ear crackling and dizziness, she had previously been to the emergency room for dizziness and chest pain and troponins were negative, negative chest x-ray, negative lab work, negative MRI of the head (was a limited MR), patient's blood pressure was elevated at the  time of patient was started on amlodipine 5 mg daily.  She was diagnosed with possible GERD and started on medication.  When patient was last seen she stated her symptoms were still persistent, blood pressure between 114 and 924 at home systolic, she  was given recent eyedrops for potential glaucoma and was concerned that this could be a potential side effect of the eyedrops, she complains of dizziness and headache in the back of the head at that time for 6 days with a recent developing of left ear crackling without any ear drainage, decrease in hearing, or ear pain had a slightly clear runny nose but sore throat, coughing, dizziness feels like the room is spinning, can happen with sitting or standing, can last for hours no vomiting but she does have nausea.Shawn Lazoff DO's examination showed patient in no distress, normal head, eyes, nose, throat however she had impacted cerumen in the left ear which was removed; also cardiovascular, pulmonary, abdominal, musculoskeletal, neurologic and psychiatric examination was normal, negative Dix-Hallpike maneuver, patient's dizziness did not appear to be orthostatic related to dizziness is persistent regardless of position, she was told to try stopping the eyedrops for 10 days, meclizine as needed.   I also reviewed emergency room notes recently seen April 15, 2020, she presented with ongoing weakness, chest pain, anorexia, nausea, started May 6, she had already been to the emergency room been evaluated by gastroenterology and had a stress test the day before, she still persistently has generalized weakness without focality, pressure in her sternum, associated anorexia, nausea, fatigue, no fever, no vomiting no shortness of breath, also had one course of doxycycline.  Her blood pressure was elevated 174/116 in the emergency room, otherwise her examination showed that she was not in acute distress, rate and rhythm normal, cardiovascular, pulmonary, abdominal, neurologic all normal.  She had an extensive work-up including imaging of her abdomen pelvis and chest, there was some mass-effect on the right ventricle but the emergency room did not think that this was significant and did further follow-up, they also reviewed the  stress test, they diagnosed her with atypical chest pain.  CBC and BMP were normal.  They also checked her troponins, LFTs, D-dimer, lipase and discharged with a diagnosis of atypical chest pain.  Patient is here alone today and states that although she has been having headaches in the back of the head she is not concerned by that and her biggest concern is because her arms and legs have been feeling weak.  She has not been feeling well since 1 May.  She went to the hospital, her blood pressure was elevated, she was having pain in her chest, evaluation showed no cardiac etiology, she was started on blood pressure meds and came home since then she has been having a dull headache in the back of the head, Excedrin helps a little bit, her legs and arms stay weak, she can function but they fall asleep and her lips tingle. This all started in May at the emergency room. In early may she went to the hospital for elevated blood pressure and chest pain. She is now off of amlodipine.  Patient reports that the weakness predates all of this above, weakness ongoing for years and worsening recently. The weakness has been worsening. She was on doxycycline recently and she had tingling in the face and the legs and arms and unclear if this made her symptoms worse, ongoing slowly progressively worsening. She feels like her arms and legs are asleep. She  can even be in bed and they feel asleep and she gets up and feels better. Worse in the left arm but all the limbs are affected, she denies any difficulty getting out of a chair or off of the floor, she can get to the top of the stairs but she feels out of breath but not due to weakness. She is generally weak all over. Slowly progressive. Numbness and tingling of all the limbs not in the face since stopping doxycycline, she has to take her time because she is afraid she is going to fall. No muscle loss or weight loss. She has noticed very occasion action tremor of the left hand and  she is more cold natured. Her husband died in 03-Jan-2023, she is on zolpidem and she take neurontin as well. She has chronic pain. headache is mild in the back of the head not positional or exertional. 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 No double vision, no droopy eyelids, no difficulty swallowing.   Reviewed notes, labs and imaging from outside physicians, which showed  I reviewed MRI of the brain report which was of poor quality, patient was unable to tolerate the full examination, and the images that were taken were severely motion degraded.  The limited study diffusion weighted showed no acute infarct.  Review of Systems: Patient complains of symptoms per HPI as well as the following symptoms: Insomnia, restless legs, headache, numbness, weakness, dizziness, fatigue, chest pain, feeling cold, decreased energy, change in appetite. Pertinent negatives and positives per HPI. All others negative.   Social History   Socioeconomic History   Marital status: Widowed    Spouse name: Not on file   Number of children: Not on file   Years of education: Not on file   Highest education level: Not on file  Occupational History   Not on file  Tobacco Use   Smoking status: Never   Smokeless tobacco: Never  Vaping Use   Vaping Use: Never used  Substance and Sexual Activity   Alcohol use: No   Drug use: No   Sexual activity: Not on file  Other Topics Concern   Not on file  Social History Narrative   Lives alone    Her husband passed 01-04-20   Caffeine: maybe 1 cup/day, mostly water    Social Determinants of Health   Financial Resource Strain: Not on file  Food Insecurity: Not on file  Transportation Needs: Not on file  Physical Activity: Not on file  Stress: Not on file  Social Connections: Not on file  Intimate Partner Violence: Not on file    Family History  Problem Relation Age of Onset   Cancer Mother    Leukemia Mother    Lung cancer Father     Multiple myeloma Father    Multiple sclerosis Sister    Alcohol abuse Brother    Multiple myeloma Brother    Colon cancer Neg Hx    Colon polyps Neg Hx    Stomach cancer Neg Hx    Esophageal cancer Neg Hx    Neuropathy Neg Hx     Past Medical History:  Diagnosis Date   Allergy    B12 deficiency 04/2020   Cataract    Coronary artery disease    COVID-19 04/22/2021   Fatty liver    GERD (gastroesophageal reflux disease)    Herpes zoster without complications    Hiatal hernia    Hypertension    Mild mitral regurgitation  2013   Mobitz type 2 second degree atrioventricular block    Pectus excavatum    pectus excavatum deformity resulting in mass effect on the RV by CT 03/2020   Pulmonary nodule    Shingles     Patient Active Problem List   Diagnosis Date Noted   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/06/2022   COVID-19 virus infection 04/23/2021   Coronary artery disease 04/23/2021   Chest pain 04/22/2021   Chest pain of uncertain etiology    Nonspecific abnormal electrocardiogram (ECG) (EKG)    2nd degree AV block 03/25/2021   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   B12 deficiency 05/07/2020   Muscle weakness 05/06/2020   Benign essential hypertension 06/08/2017   Acid reflux disease 03/08/2016   Hyperlipidemia 03/08/2016   Nocturia 03/08/2016   Restless leg syndrome 03/08/2016   Chest tightness 08/21/2012    Past Surgical History:  Procedure Laterality Date   ABDOMINAL HYSTERECTOMY  02/1984   BACK SURGERY     CATARACT EXTRACTION, BILATERAL     LEFT HEART CATH AND CORONARY ANGIOGRAPHY N/A 04/22/2021   Procedure: LEFT HEART CATH AND CORONARY ANGIOGRAPHY;  Surgeon: Burnell Blanks, MD;  Location: [facility];  Service: Cardiovascular;  Laterality: N/A;   LUMBAR LAMINECTOMY     OOPHORECTOMY  02/29/1984   unilateral    Current Outpatient Medications  Medication Sig Dispense Refill   famotidine (PEPCID) 20  MG tablet Take 20 mg by mouth daily.     gabapentin (NEURONTIN) 100 MG capsule Take 1-3 capsules by mouth 2 (two) times daily as needed. 1 capsule in the morning and 2 capsules in afternoon as needed for leg pain     LORazepam (ATIVAN) 0.5 MG tablet Take 0.5 mg by mouth 2 (two) times daily as needed for anxiety.      Polyethyl Glycol-Propyl Glycol (SYSTANE OP) Place 1 drop into both eyes 2 (two) times daily as needed (itching).     traZODone (DESYREL) 100 MG tablet Take 100 mg by mouth at bedtime.     zolpidem (AMBIEN) 10 MG tablet Take 10 mg by mouth every evening.     No current facility-administered medications for this visit.    Allergies as of 04/06/2022 - Review Complete 04/06/2022  Allergen Reaction Noted   Escitalopram Nausea Only and Other (See Comments) 12/02/2020   Pantoprazole Other (See Comments) 12/02/2020   Naproxen sodium Anxiety 12/24/2011    Vitals: BP (!) 144/98   Pulse 85   Ht '5\' 3"'  (1.6 m)   Wt 131 lb 9.6 oz (59.7 kg)   BMI 23.31 kg/m  Last Weight:  Wt Readings from Last 1 Encounters:  04/06/22 131 lb 9.6 oz (59.7 kg)   Last Height:   Ht Readings from Last 1 Encounters:  04/06/22 '5\' 3"'  (1.6 m)   Physical exam: stable Exam: Gen: NAD, conversant, well nourised, obese, well groomed                     CV: RRR, no MRG. No Carotid Bruits. No peripheral edema, warm, nontender Eyes: Conjunctivae clear without exudates or hemorrhage  Neuro: stable Detailed Neurologic Exam  Speech:    Speech is normal; fluent and spontaneous with normal comprehension.  Cognition:    The patient is oriented to person, place, and time;     recent and remote memory intact;     language fluent;     normal attention, concentration,     fund  of knowledge Cranial Nerves:    The pupils are equal, round, and reactive to light. Attempted, pupils too small to visualize fundi. Visual fields are full to finger confrontation. Extraocular movements are intact. Trigeminal sensation is  intact and the muscles of mastication are normal. The face is symmetric. The palate elevates in the midline. Hearing intact. Voice is normal. Shoulder shrug is normal. The tongue has normal motion without fasciculations.   Coordination:    Normal finger to nose and heel to shin.   Gait:   some imbalance on tandem  Motor Observation:    No asymmetry, no atrophy, and no involuntary movements noted. Tone:    Normal muscle tone.    Posture:    Posture is normal. normal erect    Strength:   5/5 strength (cannot test left hip fexion due to low back pain today)     Sensation: intact to LT, meg Romberg     Reflex Exam:  DTR's:    Deep tendon reflexes in the upper and lower extremities are brisk bilaterally.   Toes:    The toes are downgoing bilaterally.   Clonus:    Clonus 2 beats at the AJ left and 3 on the right,    +hoffman on left hand      Assessment/Plan:   76 y.o. female here as requested for stable weakness. 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 Vertell Limber but declined surgery.She is here for the same symptoms which I believe are due to cervical myelopathy.. Her exam today is stable, she still has brisk reflexes, no significant limb weakness, +clonus at the AJs, brisk reflexes, +Hoffman's sign. . EMG/NCS was also completed of the left arm and leg which was normal 04/2020. Repeat B12 today, we diagnosed her with B12 deficiency in the past.   Repeat MRI cervical spine (prior films in [place] please compare) If MRI is stable can send to physical therapy Check B12 today Will check CK buther strength is intact so unlikely a muscle disease PT for weakness and low back pain if MRI stab;e  Orders Placed This Encounter  Procedures   MR CERVICAL SPINE WO CONTRAST   B12 and Folate Panel   Methylmalonic acid, serum   CK     Cc: Christain Sacramento, MD  Sarina Ill, MD  [facility] [address] [place], [place] [address]  Phone [phone] Fax [phone]  I spent over 45 minutes of face-to-face and non-face-to-face time with patient on the  1. 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 Weakness   3. B12 deficiency   4. Cervical spondylosis with myelopathy and radiculopathy   5. Imbalance   6. Clonus   7. Abnormal neurological exam     diagnosis.  This included previsit chart review, lab review, study review, order entry, electronic health record documentation, patient education on the different diagnostic and therapeutic options, counseling and coordination of care, risks and benefits of management, compliance, or risk factor reduction

## 2022-04-06 NOTE — Patient Instructions (Addendum)
Repeat MRI cervical spine If MRI is stable can send to physical therapy(PT for weakness and low back pain) Check B12 today  What is cervical myelopathy? Cervical myelopathy is a form of myelopathy that involves compression of the spinal cord in the cervical spine (neck). Your cervical spine contains seven vertebrae (C1 to C7), with six intervertebral discs and eight nerve roots. The spinal cord travels inside the vertebral column constructed from the front by vertebrae, cushioned by the intervertebral discs, and from the back by the facet joints and lamina. In the cervical spine, eight nerve roots branch out that primarily control the function of your shoulders, arms and hands.   Symptoms of Cervical Myelopathy Cervical myelopathy produces two types of symptoms: the ones you may feel in the neck, and the ones appearing elsewhere in the body at or below the compressed area of the spinal cord.  The neck symptoms may include:  Neck pain  Stiffness  Reduced range of motion  As the disease progresses, one may experience shooting pain that originates in the neck and travels down the spine.  Other cervical myelopathy symptoms may include:  Weakness in the arms and hands and legs  Numbness or tingling in the arms and hands  Clumsiness and poor coordination of the hands  Difficulty handling small objects, like pens or coins  Balance issues  Is neck pain a sure sign of cervical myelopathy? Many people experience neck pain, but not all neck pain can be traced back to cervical myelopathy. Some patients with this condition don't have any neck pain at all. The cause of your neck pain could be muscular rather than neurological. Talk to your doctor if you are suffering from persistent neck pain.   Cervical Spondylotic Myelopathy One common type of cervical myelopathy is cervical spondylotic myelopathy. The term "spondylotic" refers to one of the possible causes of myelopathy -- gradual  degeneration of the spine that happens as you age. Therefore, cervical spondylotic myelopathy is more common in people 48 and older.  The gradual degeneration of the spine often takes the form of cervical spinal stenosis, which is the narrowing of the spinal canal in the neck. Some people are born with a narrow spinal canal (congenital spinal stenosis) and may experience myelopathy sooner than others if further narrowing occurs. Bulging or herniated discs and bone spurs in the neck are other forms of spinal degeneration that can press on the spinal cord and cause myelopathy.  Other Causes of Cervical Myelopathy Besides the gradual wear and tear of the spine, cervical myelopathy can also be caused by the ossification (hardening) of the ligaments surrounding the spinal cord, such as posterior longitudinal ligament and ligamentum flavum.  The ossification of posterior longitudinal ligament (OPLL) is more common. This means that the soft tissue that connects the bones of the spinal column becomes less flexible and slowly turns into bone (ossification). As the ligament becomes thicker, it starts taking up more space and putting pressure on the spinal cord, which leads to myelopathy. The neck portion of the spine is the most common location for OPLL ossification.  Other causes of cervical myelopathy may include:  Rheumatoid arthritis of the neck  Whiplash injury or other cervical spine trauma  Spinal infections  Spinal tumors and cancers  Cervical Myelopathy Diagnosis The earlier cervical myelopathy is diagnosed, the more successful the treatment is expected to be. However, cervical myelopathy symptoms are not unique to this condition and are often mistaken for "normal" signs of aging.  To diagnose cervical myelopathy, your doctor may:  Conduct a physical examination and measure your muscle strength and reflexes.  Conduct further tests, including an MRI scan, an X-ray or a CT myelogram of your  neck.  Conduct electrical tests to measure how well the nerves in your arms and hands communicate with your brain through the spinal cord.  Cervical Myelopathy Treatment There are some nonsurgical options for relieving cervical myelopathy symptoms, including physical therapy and a cervical collar brace. However, to eliminate the compression of the spinal cord and prevent worsening of the condition, surgery is often necessary.  There are a few surgical procedures your doctor may recommend for treating cervical myelopathy. Widening of the spinal canal (laminoplasty) can be a good motion-sparing option for some patients. Others may benefit from spine decompression surgery with spinal fusion, which is meant to stabilize the spine after herniated discs, bone spurs or ossified ligaments are fully or partially removed.  These surgeries can be performed from the back of the neck (posteriorly) or from the front of the neck (anteriorly). Your doctor will recommend a specific surgery approach based on your situation.   Cervical Radiculopathy  Cervical radiculopathy happens when a nerve in the neck (a cervical nerve) is pinched or bruised. This condition can happen because of an injury to the cervical spine (vertebrae) in the neck, or as part of the normal aging process. Pressure on the cervical nerves can cause pain or numbness that travels from the neck all the way down to the arm and fingers. This condition usually gets better with rest. Treatment may be needed if the condition does not improve. What are the causes? This condition may be caused by: A neck injury. A bulging (herniated) disk. Muscle spasms. Muscle tightness in the neck due to overuse. Arthritis. Breakdown or degeneration in the bones and joints of the spine (spondylosis) due to aging. Bone spurs that may develop near the cervical nerves. What are the signs or symptoms? Symptoms of this condition include: Pain. The pain may travel from  the neck to the arm and hand. The pain can be severe or irritating. It may get worse when you move your neck. Numbness or tingling in your arm or hand. Weakness in the affected arm and hand, in severe cases. How is this diagnosed? This condition may be diagnosed based on your symptoms, your medical history, and a physical exam. You may also have tests, including: X-rays. CT scan. MRI. Electromyogram (EMG). Nerve conduction tests. How is this treated? In many cases, treatment is not needed for this condition. With rest, the condition usually gets better over time. If treatment is needed, options may include: Wearing a soft neck collar (cervical collar) for short periods of time. Doing physical therapy to strengthen your neck muscles. Taking medicines. These may include NSAIDs, such as ibuprofen, or oral corticosteroids. Having spinal injections, in severe cases. Having surgery. This may be needed if other treatments do not help. Different types of surgery may be done depending on the cause of this condition. Follow these instructions at home: If you have a cervical collar: Wear it as told by your health care provider. Remove it only as told by your health care provider. Ask your health care provider if you can remove the cervical collar for cleaning and bathing. If you are allowed to remove the collar for cleaning or bathing: Follow instructions from your health care provider about how to remove the collar safely. Clean the collar by wiping it with mild  soap and water and drying it completely. Take out any removable pads in the collar every 1-2 days, and wash them by hand with soap and water. Let them air-dry completely before you put them back in the collar. Check your skin under the collar for irritation or sores. If you see any, tell your health care provider. Managing pain     Take over-the-counter and prescription medicines only as told by your health care provider. If directed, put  ice on the affected area. To do this: If you have a soft neck collar, remove it as told by your health care provider. Put ice in a plastic bag. Place a towel between your skin and the bag. Leave the ice on for 20 minutes, 2-3 times a day. Remove the ice if your skin turns bright red. This is very important. If you cannot feel pain, heat, or cold, you have a greater risk of damage to the area. If applying ice does not help, you can try using heat. Use the heat source that your health care provider recommends, such as a moist heat pack or a heating pad. Place a towel between your skin and the heat source. Leave the heat on for 20-30 minutes. Remove the heat if your skin turns bright red. This is especially important if you are unable to feel pain, heat, or cold. You have a greater risk of getting burned. Try a gentle neck and shoulder massage to help relieve symptoms. Activity Rest as needed. Return to your normal activities as told by your health care provider. Ask your health care provider what activities are safe for you. Do stretching and strengthening exercises as told by your health care provider or your physical therapist. You may have to avoid lifting. Ask your health care provider how much you can safely lift. General instructions Use a flat pillow when you sleep. Do not drive while wearing a cervical collar. If you do not have a cervical collar, ask your health care provider if it is safe to drive while your neck heals. Ask your health care provider if the medicine prescribed to you requires you to avoid driving or using machinery. Do not use any products that contain nicotine or tobacco. These products include cigarettes, chewing tobacco, and vaping devices, such as e-cigarettes. If you need help quitting, ask your health care provider. Keep all follow-up visits. This is important. Contact a health care provider if: Your condition does not improve with treatment. Get help right away  if: Your pain gets much worse and is not controlled with medicines. You have weakness or numbness in your hand, arm, face, or leg. You have a high fever. You have a stiff, rigid neck. You lose control of your bowels or your bladder (have incontinence). You have trouble with walking, balance, or speaking. Summary Cervical radiculopathy happens when a nerve in the neck is pinched or bruised. A nerve can get pinched from a bulging disk, arthritis, muscle spasms, or an injury to the neck. Symptoms include pain, tingling, or numbness radiating from the neck to the arm or hand. Weakness can also occur in severe cases. Treatment may include rest, wearing a cervical collar, and physical therapy. Medicines may be prescribed to help with pain. In severe cases, injections or surgery may be needed. This information is not intended to replace advice given to you by your health care provider. Make sure you discuss any questions you have with your health care provider. Document Revised: 04/22/2021 Document Reviewed: 04/22/2021 Elsevier  Patient Education  2023 [employer]  

## 2022-04-07 ENCOUNTER — Telehealth: Payer: Self-pay | Admitting: *Deleted

## 2022-04-07 NOTE — Telephone Encounter (Signed)
-----   Message from Melvenia Beam, MD sent at 04/07/2022  1:28 PM EDT ----- B12 is normal. CK(an indication of muscle health) is normal so I do not think she has a muscle disease as we discussed. She should follow through with the mri cervical spine thanks

## 2022-04-07 NOTE — Telephone Encounter (Signed)
Pt has called back for results, please call her @ [phone]

## 2022-04-07 NOTE — Telephone Encounter (Signed)
Called patient back busy signal will call back later

## 2022-04-07 NOTE — Telephone Encounter (Signed)
LVM for patient to call me back to go over lab work results

## 2022-04-08 LAB — METHYLMALONIC ACID, SERUM: Methylmalonic Acid: 285 nmol/L (ref 0–378)

## 2022-04-08 LAB — B12 AND FOLATE PANEL
Folate: >20 ng/mL (ref >3.0)
Vitamin B-12: 773 pg/mL (ref 232–1245)

## 2022-04-08 LAB — CK: Total CK: 55 U/L (ref 32–182)

## 2022-04-11 ENCOUNTER — Encounter: Payer: Self-pay | Admitting: *Deleted

## 2022-04-11 ENCOUNTER — Telehealth: Payer: Self-pay | Admitting: *Deleted

## 2022-04-11 NOTE — Telephone Encounter (Signed)
Called patient Kayla Terry for patient to call back for lab results . Did try [phone] still busy

## 2022-04-11 NOTE — Telephone Encounter (Signed)
Error

## 2022-04-11 NOTE — Telephone Encounter (Signed)
error 

## 2022-04-11 NOTE — Telephone Encounter (Signed)
-----   Message from Antonia B Ahern, MD sent at 04/07/2022  1:28 PM EDT ----- B12 is normal. CK(an indication of muscle health) is normal so I do not think she has a muscle disease as we discussed. She should follow through with the mri cervical spine thanks 

## 2022-04-12 ENCOUNTER — Telehealth: Payer: Self-pay | Admitting: *Deleted

## 2022-04-12 NOTE — Telephone Encounter (Signed)
Spoke to patient gave lab results . Gave Dr. Trevor Mace Recommendation concerning MRI cervical Spine .  Pt  expressed understanding  and thanked me for calling

## 2022-04-12 NOTE — Telephone Encounter (Signed)
-----   Message from Melvenia Beam, MD sent at 04/07/2022  1:28 PM EDT ----- B12 is normal. CK(an indication of muscle health) is normal so I do not think she has a muscle disease as we discussed. She should follow through with the mri cervical spine thanks

## 2022-04-12 NOTE — Telephone Encounter (Signed)
-----   Message from Antonia B Ahern, MD sent at 04/07/2022  1:28 PM EDT ----- B12 is normal. CK(an indication of muscle health) is normal so I do not think she has a muscle disease as we discussed. She should follow through with the mri cervical spine thanks 

## 2022-04-19 ENCOUNTER — Other Ambulatory Visit: Payer: Self-pay | Admitting: Neurological Surgery

## 2022-04-19 ENCOUNTER — Telehealth: Payer: Self-pay | Admitting: Neurology

## 2022-04-19 ENCOUNTER — Other Ambulatory Visit (HOSPITAL_COMMUNITY): Payer: Self-pay | Admitting: Neurological Surgery

## 2022-04-19 DIAGNOSIS — M5416 Radiculopathy, lumbar region: Secondary | ICD-10-CM

## 2022-04-19 NOTE — Telephone Encounter (Signed)
[employer] Berkley Harvey: [phone] exp. 04/19/22-7//19/23 sent to GI

## 2022-04-22 ENCOUNTER — Ambulatory Visit (HOSPITAL_BASED_OUTPATIENT_CLINIC_OR_DEPARTMENT_OTHER)
Admission: RE | Admit: 2022-04-22 | Discharge: 2022-04-22 | Disposition: A | Discharge status: Home / Self Care | Payer: Medicare Other | Source: Ambulatory Visit | Attending: Neurological Surgery | Admitting: Neurological Surgery

## 2022-04-22 DIAGNOSIS — M48061 Spinal stenosis, lumbar region without neurogenic claudication: Secondary | ICD-10-CM | POA: Insufficient documentation

## 2022-04-22 DIAGNOSIS — M5116 Intervertebral disc disorders with radiculopathy, lumbar region: Secondary | ICD-10-CM | POA: Diagnosis not present

## 2022-04-22 DIAGNOSIS — M5416 Radiculopathy, lumbar region: Secondary | ICD-10-CM | POA: Diagnosis present

## 2022-04-22 DIAGNOSIS — M4316 Spondylolisthesis, lumbar region: Secondary | ICD-10-CM | POA: Insufficient documentation

## 2022-04-26 ENCOUNTER — Ambulatory Visit
Admission: RE | Admit: 2022-04-26 | Discharge: 2022-04-26 | Disposition: A | Discharge status: Home / Self Care | Payer: Medicare Other | Source: Ambulatory Visit | Attending: Neurology | Admitting: Neurology

## 2022-04-26 DIAGNOSIS — M4712 Other spondylosis with myelopathy, cervical region: Secondary | ICD-10-CM

## 2022-04-26 DIAGNOSIS — R2689 Other abnormalities of gait and mobility: Secondary | ICD-10-CM | POA: Diagnosis not present

## 2022-04-26 DIAGNOSIS — R258 Other abnormal involuntary movements: Secondary | ICD-10-CM

## 2022-04-26 DIAGNOSIS — R2990 Unspecified symptoms and signs involving the nervous system: Secondary | ICD-10-CM

## 2022-04-26 DIAGNOSIS — R531 Weakness: Secondary | ICD-10-CM

## 2022-05-04 ENCOUNTER — Telehealth: Payer: Self-pay | Admitting: Neurology

## 2022-05-04 NOTE — Telephone Encounter (Deleted)
Pt called back and had other questions about her MRI which were answered. She also asked if Dr Lucia Gaskins would be about to go over the MRI results with her and show her the images in the office.

## 2022-05-04 NOTE — Telephone Encounter (Signed)
Pt is calling to get her MRI results. Pt would like a return call.

## 2022-05-04 NOTE — Telephone Encounter (Addendum)
Spoke with the patient and discussed her MRI cervical spine results. The pt states she has recently seen Dr Danielle Dess for her worsening low back and had an MRI lumbar spine. They did discuss her neck, but this was before the most recent cervical MRI was done. She states she was told he didn't her pain was coming from the top of her spine. She found out she has a pinched nerve in her back in the waistline and she is going to have an injection done this coming Friday. She was given Hydrocodone but she doesn't find it helpful. She said she would call Dr Danielle Dess back to address her cervical spine. She states she still has weakness in her upper arms and legs. Left leg seems to be worse than right. Patient also notes that sometimes she feels some numbness across her eyebrows and her lips. It doesn't last all day but rather "in the moment". She doesn't know if this could be the Hydrocodone causing it. I advised a message would be sent to Dr Lucia Gaskins. She verbalized appreciation.   Pt called right back and had other questions about her MRI which were answered. She also asked if Dr Lucia Gaskins would be about to go over the MRI results with her and show her the images in the office.

## 2022-05-04 NOTE — Telephone Encounter (Signed)
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 She saw [name] in the past who recommended surgery. [name] retired but I recommend she see Neurosurgery again, if she is open plese place referral to [facility] to any provider thanks

## 2022-05-05 NOTE — Telephone Encounter (Signed)
From Dr Lucia Gaskins: I can't assess the numbness, please address with primary care if new symptoms. Also that's great she will mention her mri c-spone to dr elsner thanks   I called the pt and relayed Dr Trevor Mace message above directly to the pt. She verbalized understanding and appreciation for the call back.

## 2022-06-25 IMAGING — DX DG CHEST 1V PORT
1 series · 1 of 1 positions shown · non-contrast
Comparison: 08/05/2021

CLINICAL DATA: Chest pain, generalized abdominal pain.

EXAM:
PORTABLE CHEST 1 VIEW

[chest ap]
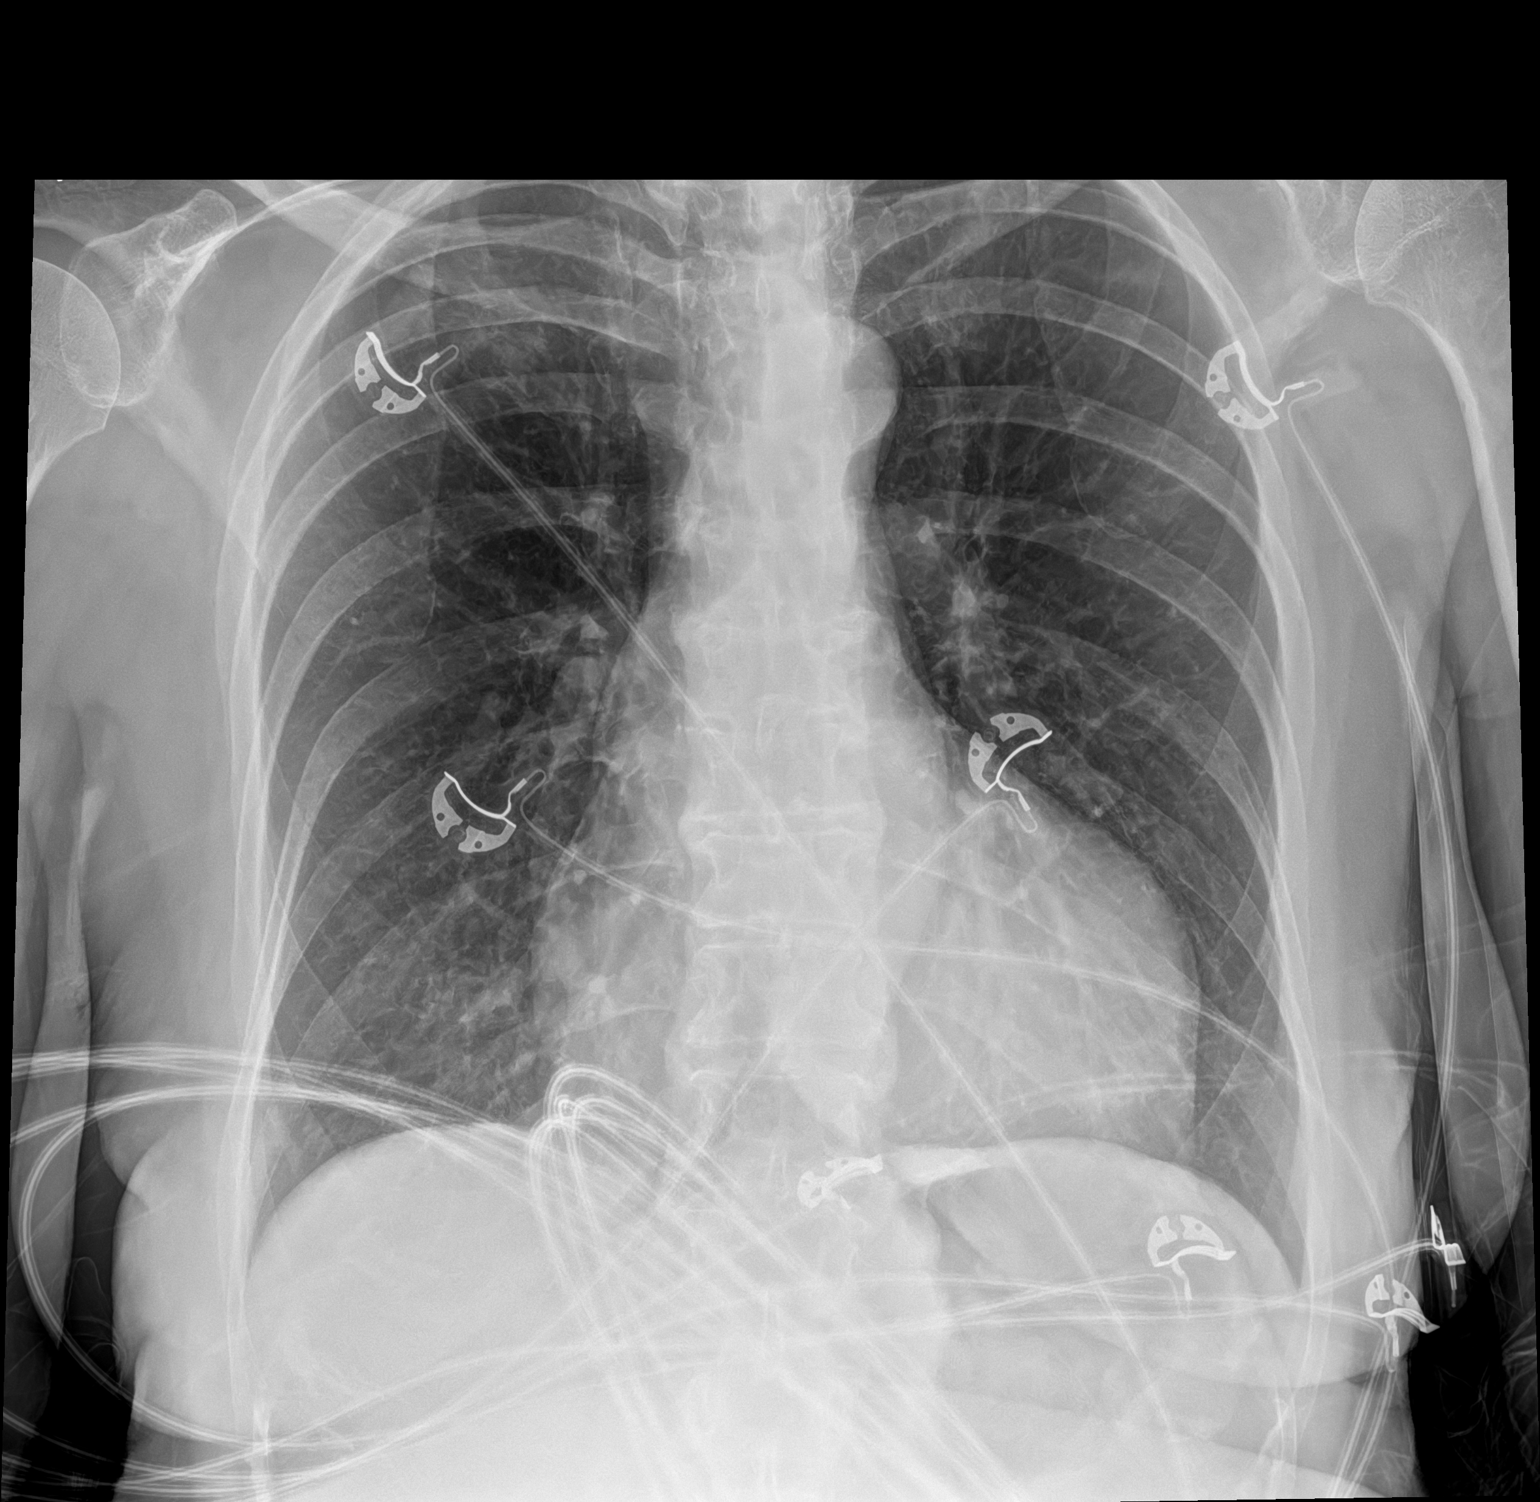

[1 of 1 positions shown; findings below may reference images not displayed]

FINDINGS: Heart size is slightly prominent for size but stable. Densities near
the right lung apex may be related to a combination of ECG lead and
anterior rib. Chronic densities at the medial right lung base.
Negative for a pneumothorax.
IMPRESSION: 1. Densities in the right upper lung could represent overlying
shadows but indeterminate. Recommend repeat study with two views and
removing the ECG leads if possible. Otherwise, no acute chest
findings.

## 2022-07-29 ENCOUNTER — Other Ambulatory Visit: Payer: Self-pay | Admitting: Family Medicine

## 2022-07-29 DIAGNOSIS — R188 Other ascites: Secondary | ICD-10-CM

## 2022-07-29 DIAGNOSIS — R102 Pelvic and perineal pain: Secondary | ICD-10-CM

## 2022-08-01 ENCOUNTER — Encounter (HOSPITAL_COMMUNITY): Payer: Self-pay | Admitting: Emergency Medicine

## 2022-08-01 ENCOUNTER — Emergency Department (HOSPITAL_COMMUNITY): Payer: Medicare Other

## 2022-08-01 ENCOUNTER — Observation Stay (HOSPITAL_COMMUNITY)
Admission: EM | Admit: 2022-08-01 | Discharge: 2022-08-02 | Disposition: A | Discharge status: Home / Self Care | Payer: Medicare Other | Attending: Internal Medicine | Admitting: Internal Medicine

## 2022-08-01 ENCOUNTER — Other Ambulatory Visit: Payer: Medicare Other

## 2022-08-01 ENCOUNTER — Other Ambulatory Visit: Payer: Self-pay

## 2022-08-01 DIAGNOSIS — R102 Pelvic and perineal pain: Secondary | ICD-10-CM | POA: Diagnosis present

## 2022-08-01 DIAGNOSIS — R519 Headache, unspecified: Secondary | ICD-10-CM | POA: Insufficient documentation

## 2022-08-01 DIAGNOSIS — I10 Essential (primary) hypertension: Secondary | ICD-10-CM | POA: Diagnosis present

## 2022-08-01 DIAGNOSIS — Z8616 Personal history of COVID-19: Secondary | ICD-10-CM | POA: Diagnosis not present

## 2022-08-01 DIAGNOSIS — I160 Hypertensive urgency: Secondary | ICD-10-CM | POA: Diagnosis not present

## 2022-08-01 DIAGNOSIS — I2510 Atherosclerotic heart disease of native coronary artery without angina pectoris: Secondary | ICD-10-CM | POA: Diagnosis present

## 2022-08-01 DIAGNOSIS — Z7982 Long term (current) use of aspirin: Secondary | ICD-10-CM | POA: Diagnosis not present

## 2022-08-01 DIAGNOSIS — Z79899 Other long term (current) drug therapy: Secondary | ICD-10-CM | POA: Diagnosis not present

## 2022-08-01 DIAGNOSIS — K219 Gastro-esophageal reflux disease without esophagitis: Secondary | ICD-10-CM | POA: Diagnosis present

## 2022-08-01 DIAGNOSIS — R109 Unspecified abdominal pain: Secondary | ICD-10-CM

## 2022-08-01 LAB — URINALYSIS, ROUTINE W REFLEX MICROSCOPIC
Bilirubin Urine: NEGATIVE
Glucose, UA: NEGATIVE mg/dL
Hgb urine dipstick: NEGATIVE
Ketones, ur: NEGATIVE mg/dL
Nitrite: NEGATIVE
Protein, ur: NEGATIVE mg/dL
Specific Gravity, Urine: 1.002 — ABNORMAL LOW (ref 1.005–1.030)
pH: 8 (ref 5.0–8.0)

## 2022-08-01 LAB — COMPREHENSIVE METABOLIC PANEL
ALT: 12 U/L (ref 0–44)
AST: 17 U/L (ref 15–41)
Albumin: 4.2 g/dL (ref 3.5–5.0)
Alkaline Phosphatase: 74 U/L (ref 38–126)
Anion gap: 10 (ref 5–15)
BUN: 7 mg/dL — ABNORMAL LOW (ref 8–23)
CO2: 26 mmol/L (ref 22–32)
Calcium: 9.2 mg/dL (ref 8.9–10.3)
Chloride: 100 mmol/L (ref 98–111)
Creatinine, Ser: 0.9 mg/dL (ref 0.44–1.00)
GFR, Estimated: >60 mL/min (ref >60)
Glucose, Bld: 113 mg/dL — ABNORMAL HIGH (ref 70–99)
Potassium: 3.9 mmol/L (ref 3.5–5.1)
Sodium: 136 mmol/L (ref 135–145)
Total Bilirubin: 0.5 mg/dL (ref 0.3–1.2)
Total Protein: 7.1 g/dL (ref 6.5–8.1)

## 2022-08-01 LAB — CBC
HCT: 42.4 % (ref 36.0–46.0)
Hemoglobin: 14 g/dL (ref 12.0–15.0)
MCH: 30.6 pg (ref 26.0–34.0)
MCHC: 33 g/dL (ref 30.0–36.0)
MCV: 92.8 fL (ref 80.0–100.0)
Platelets: 276 10*3/uL (ref 150–400)
RBC: 4.57 MIL/uL (ref 3.87–5.11)
RDW: 13.2 % (ref 11.5–15.5)
WBC: 7.6 10*3/uL (ref 4.0–10.5)
nRBC: 0 % (ref 0.0–0.2)

## 2022-08-01 LAB — TROPONIN I (HIGH SENSITIVITY): Troponin I (High Sensitivity): 6 ng/L (ref <18)

## 2022-08-01 LAB — WET PREP, GENITAL
Clue Cells Wet Prep HPF POC: NONE SEEN
Sperm: NONE SEEN
Trich, Wet Prep: NONE SEEN
WBC, Wet Prep HPF POC: >=10 — AB (ref <10)
Yeast Wet Prep HPF POC: NONE SEEN

## 2022-08-01 LAB — LIPASE, BLOOD: Lipase: 28 U/L (ref 11–51)

## 2022-08-01 MED ORDER — HYDRALAZINE HCL 20 MG/ML IJ SOLN
5.0000 mg | Freq: Once | INTRAMUSCULAR | Status: AC
  Administered 2022-08-01: 5 mg via INTRAVENOUS
  Filled 2022-08-01: qty 1

## 2022-08-01 MED ORDER — HEPARIN SODIUM (PORCINE) 5000 UNIT/ML IJ SOLN
5000.0000 [IU] | Freq: Three times a day (TID) | INTRAMUSCULAR | Status: DC
  Filled 2022-08-01: qty 1

## 2022-08-01 MED ORDER — GABAPENTIN 300 MG PO CAPS: 300.0000 mg | ORAL_CAPSULE | Freq: Three times a day (TID) | ORAL | Status: DC | PRN

## 2022-08-01 MED ORDER — MORPHINE SULFATE (PF) 2 MG/ML IV SOLN: 2.0000 mg | INTRAVENOUS | Status: DC | PRN

## 2022-08-01 MED ORDER — NICARDIPINE HCL IN NACL 20-0.86 MG/200ML-% IV SOLN: 3.0000 mg/h | INTRAVENOUS | Status: DC

## 2022-08-01 MED ORDER — LABETALOL HCL 5 MG/ML IV SOLN
10.0000 mg | Freq: Once | INTRAVENOUS | Status: AC
  Administered 2022-08-01: 10 mg via INTRAVENOUS
  Filled 2022-08-01: qty 4

## 2022-08-01 MED ORDER — FAMOTIDINE 20 MG PO TABS
20.0000 mg | ORAL_TABLET | Freq: Every day | ORAL | Status: DC
  Administered 2022-08-02: 20 mg via ORAL
  Filled 2022-08-01: qty 1

## 2022-08-01 MED ORDER — ZOLPIDEM TARTRATE 5 MG PO TABS
5.0000 mg | ORAL_TABLET | Freq: Every evening | ORAL | Status: DC
  Administered 2022-08-01: 5 mg via ORAL
  Filled 2022-08-01: qty 1

## 2022-08-01 MED ORDER — ONDANSETRON HCL 4 MG/2ML IJ SOLN
4.0000 mg | Freq: Four times a day (QID) | INTRAMUSCULAR | Status: DC | PRN
  Administered 2022-08-02: 4 mg via INTRAVENOUS
  Filled 2022-08-01: qty 2

## 2022-08-01 MED ORDER — MELATONIN 3 MG PO TABS
6.0000 mg | ORAL_TABLET | Freq: Every day | ORAL | Status: DC
  Administered 2022-08-01: 6 mg via ORAL
  Filled 2022-08-01: qty 2

## 2022-08-01 MED ORDER — OXYCODONE HCL 5 MG PO TABS: 5.0000 mg | ORAL_TABLET | ORAL | Status: DC | PRN

## 2022-08-01 MED ORDER — ACETAMINOPHEN 325 MG PO TABS: 650.0000 mg | ORAL_TABLET | Freq: Four times a day (QID) | ORAL | Status: DC | PRN

## 2022-08-01 MED ORDER — TRAZODONE HCL 50 MG PO TABS
50.0000 mg | ORAL_TABLET | Freq: Every day | ORAL | Status: DC
  Administered 2022-08-01: 50 mg via ORAL
  Filled 2022-08-01: qty 1

## 2022-08-01 MED ORDER — ACETAMINOPHEN 650 MG RE SUPP: 650.0000 mg | Freq: Four times a day (QID) | RECTAL | Status: DC | PRN

## 2022-08-01 MED ORDER — ONDANSETRON HCL 4 MG PO TABS: 4.0000 mg | ORAL_TABLET | Freq: Four times a day (QID) | ORAL | Status: DC | PRN

## 2022-08-01 MED ORDER — ASPIRIN 81 MG PO TBEC
81.0000 mg | DELAYED_RELEASE_TABLET | Freq: Every day | ORAL | Status: DC
  Administered 2022-08-02: 81 mg via ORAL
  Filled 2022-08-01: qty 1

## 2022-08-01 MED ORDER — AMLODIPINE BESYLATE 5 MG PO TABS
5.0000 mg | ORAL_TABLET | Freq: Every day | ORAL | Status: DC
  Administered 2022-08-01 – 2022-08-02 (×2): 5 mg via ORAL
  Filled 2022-08-01 (×2): qty 1

## 2022-08-01 NOTE — ED Provider Notes (Signed)
[facility] EMERGENCY DEPARTMENT Provider Note   CSN: [id number] Arrival date & time: 08/01/22  1027     History  Chief Complaint  Patient presents with   Abdominal Pain    Kayla Terry is a 76 y.o. 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  She reports that she had been on amlodipine in the past but was taken off more than a year ago as her blood pressure was well controlled, even soft.  She is not had any elevated blood pressures but woke today with headache and checked her blood pressure and initially was in the 170/100 range.  She denies focal weakness, vision changes, nausea or vomiting.  She does report feeling shaky and weak in her legs, bilaterally.  No chest pain, no shortness of breath, denies orthopnea.  Secondary complaint is for acute on chronic low pelvic pain.  Unclear etiology, but was referred for a pelvic ultrasound by her primary doctor after a CT scan completed here earlier this summer revealing mild pelvic ascites.  She was supposed to have this pelvic ultrasound today.  She endorses persistent pain in her lower pelvic region.  She also reports external genital irritation, her PCP had recently given her Diflucan for presumptive yeast infection which has not resolved.  She is not sexually active.  The history is provided by the patient.       Home Medications Prior to Admission medications   Medication Sig Start Date End Date Taking? Authorizing Provider  aspirin EC 81 MG tablet Take 81 mg by mouth daily. Swallow whole.   Yes [provider]  aspirin-acetaminophen-caffeine (EXCEDRIN MIGRAINE) [phone] MG tablet Take 2 tablets by mouth every 6 (six) hours as needed for headache.   Yes [provider]  gabapentin  (NEURONTIN) 300 MG capsule Take 300 mg by mouth 3 (three) times daily as needed. 07/18/22  Yes [provider]  Polyethyl Glycol-Propyl Glycol (SYSTANE OP) Place 1 drop into both eyes 2 (two) times daily as needed (itching).   Yes [provider]  traZODone (DESYREL) 100 MG tablet Take 50 mg by mouth at bedtime. 03/02/22  Yes [provider]  zolpidem (AMBIEN) 10 MG tablet Take 10 mg by mouth every evening.   Yes [provider]  famotidine (PEPCID) 20 MG tablet Take 20 mg by mouth daily.    [provider]  gabapentin (NEURONTIN) 100 MG capsule Take 1-3 capsules by mouth 2 (two) times daily as needed. 1 capsule in the morning and 2 capsules in afternoon as needed for leg pain Patient not taking: Reported on 08/01/2022 02/15/22   [provider]  LORazepam (ATIVAN) 0.5 MG tablet Take 0.5 mg by mouth 2 (two) times daily as needed for anxiety.  Patient not taking: Reported on 08/01/2022    [provider]      Allergies    Escitalopram, Pantoprazole, and Naproxen sodium    Review of Systems   Review of Systems  Constitutional:  Negative for chills and fever.  HENT:  Negative for congestion and sore throat.   Eyes: Negative.   Respiratory:  Negative for chest tightness and shortness of breath.   Cardiovascular:  Negative for chest pain.  Gastrointestinal:  Negative for abdominal pain and nausea.  Genitourinary: Negative.   Musculoskeletal:  Negative for arthralgias, joint  swelling and neck pain.  Skin: Negative.  Negative for rash and wound.  Neurological:  Positive for weakness and headaches. Negative for dizziness, light-headedness and numbness.  Psychiatric/Behavioral: Negative.    All other systems reviewed and are negative.   Physical Exam Updated Vital Signs BP (!) 172/100   Pulse 84   Temp 98.2 F (36.8 C) (Oral)   Resp 16   Ht '5\' 3"'  (1.6 m)   Wt 59.7 kg   SpO2 98%   BMI 23.31 kg/m  Physical Exam Vitals and  nursing note reviewed.  Constitutional:      Appearance: She is well-developed.  HENT:     Head: Normocephalic and atraumatic.     Right Ear: Tympanic membrane normal.     Left Ear: Tympanic membrane normal.  Eyes:     Extraocular Movements: Extraocular movements intact.     Conjunctiva/sclera: Conjunctivae normal.     Pupils: Pupils are equal, round, and reactive to light.  Cardiovascular:     Rate and Rhythm: Normal rate and regular rhythm.     Heart sounds: Normal heart sounds.  Pulmonary:     Effort: Pulmonary effort is normal.     Breath sounds: Normal breath sounds. No wheezing.  Abdominal:     General: Bowel sounds are normal. There is no distension.     Palpations: Abdomen is soft.     Tenderness: There is abdominal tenderness in the suprapubic area.  Musculoskeletal:        General: Normal range of motion.     Cervical back: Normal range of motion and neck supple.  Lymphadenopathy:     Cervical: No cervical adenopathy.  Skin:    General: Skin is warm and dry.     Findings: No rash.  Neurological:     General: No focal deficit present.     Mental Status: She is alert and oriented to person, place, and time.     GCS: GCS eye subscore is 4. GCS verbal subscore is 5. GCS motor subscore is 6.     Cranial Nerves: No cranial nerve deficit.     Sensory: No sensory deficit.     Coordination: Coordination normal.     Gait: Gait normal.     Deep Tendon Reflexes: Reflexes normal.     Comments: Normal heel-shin, normal rapid alternating movements. Cranial nerves III-XII intact.  No pronator drift.  Psychiatric:        Speech: Speech normal.        Behavior: Behavior normal.        Thought Content: Thought content normal.     ED Results / Procedures / Treatments   Labs (all labs ordered are listed, but only abnormal results are displayed) Labs Reviewed  WET PREP, GENITAL - Abnormal; Notable for the following components:      Result Value   WBC, Wet Prep HPF POC >=10 (*)     All other components within normal limits  COMPREHENSIVE METABOLIC PANEL - Abnormal; Notable for the following components:   Glucose, Bld 113 (*)    BUN 7 (*)    All other components within normal limits  URINALYSIS, ROUTINE W REFLEX MICROSCOPIC - Abnormal; Notable for the following components:   Color, Urine COLORLESS (*)    Specific Gravity, Urine 1.002 (*)    Leukocytes,Ua SMALL (*)    Bacteria, UA RARE (*)    All other components within normal limits  LIPASE, BLOOD  CBC  TROPONIN I (HIGH SENSITIVITY)  EKG EKG Interpretation  Date/Time:  Monday August 01 2022 14:14:02 EDT Ventricular Rate:  69 PR Interval:  195 QRS Duration: 125 QT Interval:  435 QTC Calculation: 466 R Axis:   201 Text Interpretation: Sinus rhythm Right bundle branch block Anteroseptal infarct, age indeterminate Confirmed by Georgina Snell [phone]) on 08/01/2022 6:20:46 PM  Radiology CT Head Wo Contrast  Result Date: 08/01/2022 CLINICAL DATA:  Hypertension, unsteady gait EXAM: CT HEAD WITHOUT CONTRAST TECHNIQUE: Contiguous axial images were obtained from the base of the skull through the vertex without intravenous contrast. RADIATION DOSE REDUCTION: This exam was performed according to the departmental dose-optimization program which includes automated exposure control, adjustment of the mA and/or kV according to patient size and/or use of iterative reconstruction technique. COMPARISON:  CT head dated 01/26/2022 FINDINGS: Brain: No evidence of acute infarction, hemorrhage, hydrocephalus, extra-axial collection or mass lesion/mass effect. Mild subcortical white matter and periventricular small vessel ischemic changes. Vascular: No hyperdense vessel or unexpected calcification. Skull: Normal. Negative for fracture or focal lesion. Sinuses/Orbits: The visualized paranasal sinuses are essentially clear. The mastoid air cells are unopacified. Other: None. IMPRESSION: No evidence of acute intracranial abnormality.  Mild small vessel ischemic changes. Electronically Signed   By: Julian Hy M.D.   On: 08/01/2022 20:23   US PELVIC COMPLETE WITH TRANSVAGINAL  Result Date: 08/01/2022 CLINICAL DATA:  Pelvic pressure in a female EXAM: TRANSABDOMINAL AND TRANSVAGINAL ULTRASOUND OF PELVIS TECHNIQUE: Both transabdominal and transvaginal ultrasound examinations of the pelvis were performed. Transabdominal technique was performed for global imaging of the pelvis including uterus, ovaries, adnexal regions, and pelvic cul-de-sac. It was necessary to proceed with endovaginal exam following the transabdominal exam to visualize the ovary and adnexa. COMPARISON:  CT abdomen and pelvis 01/13/2022 FINDINGS: Uterus Surgically absent Endometrium Surgically absent Right ovary Not visualized, question surgically absent versus obscured by bowel Left ovary Not visualized, question surgically absent versus obscured by bowel Other findings Small amount of nonspecific free pelvic fluid.  No adnexal masses. IMPRESSION: Post hysterectomy with nonvisualization of ovaries. Small amount of nonspecific free pelvic fluid. Electronically Signed   By: Lavonia Dana M.D.   On: 08/01/2022 16:36    Procedures Procedures    Medications Ordered in ED Medications  amLODipine (NORVASC) tablet 5 mg (has no administration in time range)  hydrALAZINE (APRESOLINE) injection 5 mg (5 mg Intravenous Given 08/01/22 1832)  hydrALAZINE (APRESOLINE) injection 5 mg (5 mg Intravenous Given 08/01/22 1924)  labetalol (NORMODYNE) injection 10 mg (10 mg Intravenous Given 08/01/22 2042)    ED Course/ Medical Decision Making/ A&P                           Medical Decision Making Patient with several complaints, most urgently significant hypertension which started this morning.  She does endorse a generalized headache, no other focal weakness, dizziness, denies visual changes, chest pain or shortness of breath.  There is no sign of endorgan damage, although CT head  is ordered and pending.  She has no focal findings, her neuro exam is normal with no sign of acute stroke.  She has received hydralazine without significant improvement in her blood pressure, last bp 196/113.   Labetalol added.  Additionally has complaints of acute on chronic low pelvic pain, had a small amount of ascites on the previous CT scan, her PCP had scheduled her for a pelvic ultrasound this afternoon which she is missing because she is here instead.  This ultrasound was completed  here and is reassuring with no evidence for pelvic pathology.    Amount and/or Complexity of Data Reviewed Labs: ordered.    Details: Labs reviewed including CBC, c-Met, her creatinine is reassuring.  Troponin negative UA is negative for protein.  We also added a self collection wet prep given her GU complaint, negative for BV and yeast. Radiology: ordered.    Details: Pelvic ultrasound completed secondary to patient's chronic pain, no evidence for pelvic pathology. ECG/medicine tests: ordered.    Details: Right bundle branch block, no acute findings. Discussion of management or test interpretation with external provider(s): Discussed with Dr. Clearence Ped who accepts pt for admission.   Risk Decision regarding hospitalization.           Final Clinical Impression(s) / ED Diagnoses Final diagnoses:  Hypertensive urgency    Rx / DC Orders ED Discharge Orders     None         Landis Martins 08/01/22 2053    Milton Ferguson, MD 08/04/22 1015

## 2022-08-01 NOTE — ED Triage Notes (Signed)
Pt to the ED with complaints of high blood pressure with a headache. Pt does not normally take Medication for blood pressure.

## 2022-08-01 NOTE — ED Notes (Signed)
Patient reports concern about BP readings. Patient assisted to the restroom by this tech and patient had unsteady gait prefers right side. PAand RN notified.

## 2022-08-01 NOTE — ED Notes (Signed)
ED PA at BS 

## 2022-08-02 ENCOUNTER — Observation Stay (HOSPITAL_COMMUNITY): Payer: Medicare Other

## 2022-08-02 DIAGNOSIS — I160 Hypertensive urgency: Secondary | ICD-10-CM | POA: Diagnosis not present

## 2022-08-02 DIAGNOSIS — R109 Unspecified abdominal pain: Secondary | ICD-10-CM

## 2022-08-02 LAB — COMPREHENSIVE METABOLIC PANEL
ALT: 12 U/L (ref 0–44)
AST: 16 U/L (ref 15–41)
Albumin: 3.9 g/dL (ref 3.5–5.0)
Alkaline Phosphatase: 66 U/L (ref 38–126)
Anion gap: 8 (ref 5–15)
BUN: 8 mg/dL (ref 8–23)
CO2: 25 mmol/L (ref 22–32)
Calcium: 9.5 mg/dL (ref 8.9–10.3)
Chloride: 105 mmol/L (ref 98–111)
Creatinine, Ser: 0.75 mg/dL (ref 0.44–1.00)
GFR, Estimated: >60 mL/min (ref >60)
Glucose, Bld: 111 mg/dL — ABNORMAL HIGH (ref 70–99)
Potassium: 3.7 mmol/L (ref 3.5–5.1)
Sodium: 138 mmol/L (ref 135–145)
Total Bilirubin: 0.7 mg/dL (ref 0.3–1.2)
Total Protein: 6.7 g/dL (ref 6.5–8.1)

## 2022-08-02 LAB — CBC WITH DIFFERENTIAL/PLATELET
Abs Immature Granulocytes: 0.03 10*3/uL (ref 0.00–0.07)
Basophils Absolute: 0.1 10*3/uL (ref 0.0–0.1)
Basophils Relative: 1 %
Eosinophils Absolute: 0.1 10*3/uL (ref 0.0–0.5)
Eosinophils Relative: 1 %
HCT: 40.1 % (ref 36.0–46.0)
Hemoglobin: 13.5 g/dL (ref 12.0–15.0)
Immature Granulocytes: 0 %
Lymphocytes Relative: 16 %
Lymphs Abs: 1.2 10*3/uL (ref 0.7–4.0)
MCH: 30.9 pg (ref 26.0–34.0)
MCHC: 33.7 g/dL (ref 30.0–36.0)
MCV: 91.8 fL (ref 80.0–100.0)
Monocytes Absolute: 0.7 10*3/uL (ref 0.1–1.0)
Monocytes Relative: 9 %
Neutro Abs: 5.5 10*3/uL (ref 1.7–7.7)
Neutrophils Relative %: 73 %
Platelets: 288 10*3/uL (ref 150–400)
RBC: 4.37 MIL/uL (ref 3.87–5.11)
RDW: 13.4 % (ref 11.5–15.5)
WBC: 7.6 10*3/uL (ref 4.0–10.5)
nRBC: 0 % (ref 0.0–0.2)

## 2022-08-02 LAB — MAGNESIUM: Magnesium: 2.1 mg/dL (ref 1.7–2.4)

## 2022-08-02 MED ORDER — FLUTICASONE PROPIONATE 50 MCG/ACT NA SUSP
1.0000 | Freq: Every day | NASAL | Status: DC
  Administered 2022-08-02: 1 via NASAL
  Filled 2022-08-02: qty 16

## 2022-08-02 MED ORDER — AMLODIPINE BESYLATE 5 MG PO TABS: 5.0000 mg | ORAL_TABLET | Freq: Every day | ORAL | 1 refills | Status: DC

## 2022-08-02 MED ORDER — SODIUM CHLORIDE 0.9 % IV SOLN
1.0000 g | INTRAVENOUS | Status: DC
  Administered 2022-08-02: 1 g via INTRAVENOUS
  Filled 2022-08-02: qty 10

## 2022-08-02 NOTE — Assessment & Plan Note (Signed)
See plan for hypertension 

## 2022-08-02 NOTE — Progress Notes (Signed)
Patient stable and ready for discharge home. Writer removed IV and went over discharge paperwork with patient and she verbalized understanding and no questions. Patient dressed herself. Patient's grandson picked patient up for discharge home.

## 2022-08-02 NOTE — Assessment & Plan Note (Signed)
-   Chronic for several months - Pelvic ultrasound done in the ED which showed post hysterectomy with nonvisualization of the ovaries (patient believes she should have 1 ovary) and small amount of nonspecific free fluid in the pelvis - Last CT scan of her abdomen and pelvis was 7 months ago so repeat was done today which showed no CT abnormality or interval changes - Patient reports her last colonoscopy was 2 years ago and she was told to come back in 10 years - Her pain seems to be pretty well localized over her bladder, and her UA is borderline, start Rocephin although the CT abdomen shows no thickening of the bladder wall, so she may not have an acute cystitis - Urine culture pending - Continue pain control with pain scale - Continue to monitor

## 2022-08-02 NOTE — Assessment & Plan Note (Signed)
-   Continue Pepcid-which has not helped her lower abdominal pain

## 2022-08-02 NOTE — H&P (Signed)
History and Physical    Patient: Kayla Terry HCW:[id number] DOB: January 28, 1946 DOA: 08/01/2022 DOS: the patient was seen and examined on 08/02/2022 PCP: Christain Sacramento, MD  Patient coming from: Home  Chief Complaint:  Chief Complaint  Patient presents with   Abdominal Pain   HPI: Kayla Terry is a 76 y.o. female with medical history significant of allergies, B12 deficiency, coronary artery disease, GERD, fatty liver, hypertension, Mobitz type II second-degree atrioventricular block, and more presents ED with a chief complaint of high blood pressure.  Patient reports that several years ago her blood pressure was high and she was started on amlodipine while she was in the hospital.  When she followed up with her cardiologist she took her off of the amlodipine and told her he was worried about her blood pressure dropping too low.  He advised her to get a blood pressure monitor which she uses regularly per her report.  Yesterday she felt dizzy and jittery.  It started in the morning on the day of presentation-which was yesterday.  She had a headache that started in the back of her head and felt like a pressure.  She had blurry vision.  She had no change in hearing or tinnitus.  Patient reports she had no appetite and did not eat throughout the day.  She had no chest pain.  She checked her blood pressure and it was higher than she is ever seen it before.  She came into the ED, and reports her blood pressure continued to climb while she was in the waiting room.  Since being treated for blood pressure her dizziness and jittery feeling is gone away.  Her headache is gone away as well.  Patient reports that she still has lower abdominal pain.  This has been going on for several months.  Has been gradually worse since it started.  She describes it as a soreness.  She saw her PCP who did a UA and said that that was clean.  She denies bladder prolapse.  He had arranged her to get an ultrasound but she is not going  to make it to the appointment since she is here.  They did a pelvic ultrasound in the ED which did not have any acute findings to explain her symptoms.  Patient reports she has regular bowel movements.  There is no blood in her bowel movements.  Her last colonoscopy was 2 years ago and he told her to come back in 10 years.  She has had no fever.  Patient denies dysuria, hematuria.  She reports unintentional weight loss.  She reports that she feels like she is fading away.  Patient has no other complaints at this time.  Patient does not smoke, does not drink, does not use illicit drugs.  She is full code. Review of Systems: As mentioned in the history of present illness. All other systems reviewed and are negative. Past Medical History:  Diagnosis Date   Allergy    B12 deficiency 04/2020   Cataract    Coronary artery disease    COVID-19 04/22/2021   Fatty liver    GERD (gastroesophageal reflux disease)    Herpes zoster without complications    Hiatal hernia    Hypertension    Mild mitral regurgitation 2013   Mobitz type 2 second degree atrioventricular block    Pectus excavatum    pectus excavatum deformity resulting in mass effect on the RV by CT 03/2020   Pulmonary nodule    Shingles  Past Surgical History:  Procedure Laterality Date   ABDOMINAL HYSTERECTOMY  02/1984   BACK SURGERY     CATARACT EXTRACTION, BILATERAL     LEFT HEART CATH AND CORONARY ANGIOGRAPHY N/A 04/22/2021   Procedure: LEFT HEART CATH AND CORONARY ANGIOGRAPHY;  Surgeon: Burnell Blanks, MD;  Location: [facility];  Service: Cardiovascular;  Laterality: N/A;   LUMBAR LAMINECTOMY     OOPHORECTOMY  02/29/1984   unilateral   Social History:  reports that she has never smoked. She has never used smokeless tobacco. She reports that she does not drink alcohol and does not use drugs.  Allergies  Allergen Reactions   Escitalopram Nausea Only and Other (See Comments)    Nausea and dizziness    Pantoprazole Other (See Comments)    Severe headaches    Naproxen Sodium Anxiety    Family History  Problem Relation Age of Onset   Cancer Mother    Leukemia Mother    Lung cancer Father    Multiple myeloma Father    Multiple sclerosis Sister    Alcohol abuse Brother    Multiple myeloma Brother    Colon cancer Neg Hx    Colon polyps Neg Hx    Stomach cancer Neg Hx    Esophageal cancer Neg Hx    Neuropathy Neg Hx     Prior to Admission medications   Medication Sig Start Date End Date Taking? Authorizing Provider  aspirin EC 81 MG tablet Take 81 mg by mouth daily. Swallow whole.   Yes [provider]  aspirin-acetaminophen-caffeine (EXCEDRIN MIGRAINE) [phone] MG tablet Take 2 tablets by mouth every 6 (six) hours as needed for headache.   Yes [provider]  gabapentin (NEURONTIN) 300 MG capsule Take 300 mg by mouth 3 (three) times daily as needed. 07/18/22  Yes [provider]  Polyethyl Glycol-Propyl Glycol (SYSTANE OP) Place 1 drop into both eyes 2 (two) times daily as needed (itching).   Yes [provider]  traZODone (DESYREL) 100 MG tablet Take 50 mg by mouth at bedtime. 03/02/22  Yes [provider]  zolpidem (AMBIEN) 10 MG tablet Take 10 mg by mouth every evening.   Yes [provider]  famotidine (PEPCID) 20 MG tablet Take 20 mg by mouth daily.    [provider]  gabapentin (NEURONTIN) 100 MG capsule Take 1-3 capsules by mouth 2 (two) times daily as needed. 1 capsule in the morning and 2 capsules in afternoon as needed for leg pain Patient not taking: Reported on 08/01/2022 02/15/22   [provider]  LORazepam (ATIVAN) 0.5 MG tablet Take 0.5 mg by mouth 2 (two) times daily as needed for anxiety.  Patient not taking: Reported on 08/01/2022    [provider]    Physical Exam: Vitals:   08/02/22 0300 08/02/22 0315 08/02/22 0330 08/02/22 0400  BP: 102/64 110/81 101/69 132/77  Pulse: 72 82 82  82  Resp: '18 16 19 17  ' Temp:      TempSrc:      SpO2: 94% 97% 94% 99%  Weight:      Height:       1.  General: Patient lying supine in bed,  no acute distress   2. Psychiatric: Alert and oriented x 3, mood and behavior normal for situation, pleasant and cooperative with exam   3. Neurologic: Speech and language are normal, face is symmetric, moves all 4 extremities voluntarily, at baseline without acute deficits on limited exam  4. HEENMT:  Head is atraumatic, normocephalic, pupils reactive to light, neck is supple, trachea is midline, mucous membranes are moist   5. Respiratory : Lungs are clear to auscultation bilaterally without wheezing, rhonchi, rales, no cyanosis, no increase in work of breathing or accessory muscle use   6. Cardiovascular : Heart rate normal, rhythm is regular, no murmurs, rubs or gallops, no peripheral edema, peripheral pulses palpated   7. Gastrointestinal:  Suprapubic tenderness, abdomen is soft, nondistended, abdomen is nontender to palpation, bowel sounds active, no masses or organomegaly palpated   8. Skin:  Skin is warm, dry and intact without rashes, acute lesions, or ulcers on limited exam   9.Musculoskeletal:  No acute deformities or trauma, no asymmetry in tone, no peripheral edema, peripheral pulses palpated, no tenderness to palpation in the extremities  Data Reviewed: In the ED Temp 97.8, heart rate 68-101, respiratory rate 12-24, blood pressure 171/94-201/125, but at the time of my exam blood pressure is 130/77 No leukocytosis, hemoglobin 14.0 Chemistry unremarkable Apparently there was some confusion about her chief complaint and because it was abdominal pain initially she was worked up for abdominal pain with a wet prep that showed greater than 10 white blood cells, and a pelvic ultrasound.   UA borderline Troponin 6 Head showed no intracranial abnormality EKG shows a heart rate of 69, sinus rhythm, QTc 4 and 66 Patient was given  hydralazine 5 mg x 2 and then labetalol 10 mg IV Admission was requested for hypertensive urgency and although we discussed possibly sending patient home, provider felt more comfortable with her being monitored here for her blood pressure.  Cardene drip was ordered but amlodipine was ordered at the same time and given first.  Cardene drip was never needed.  Patient was downgraded from stepdown to telemetry.   Assessment and Plan: * Hypertensive urgency See plan for hypertension  Abdominal pain - Chronic for several months - Pelvic ultrasound done in the ED which showed post hysterectomy with nonvisualization of the ovaries (patient believes she should have 1 ovary) and small amount of nonspecific free fluid in the pelvis - Last CT scan of her abdomen and pelvis was 7 months ago so repeat was done today which showed no CT abnormality or interval changes - Patient reports her last colonoscopy was 2 years ago and she was told to come back in 10 years - Her pain seems to be pretty well localized over her bladder, and her UA is borderline, start Rocephin although the CT abdomen shows no thickening of the bladder wall, so she may not have an acute cystitis - Urine culture pending - Continue pain control with pain scale - Continue to monitor  Coronary artery disease Continue aspirin  Benign essential hypertension - Patient reports she was taken off of Norvasc several years ago - Today her blood pressure was quite high and at admission she had been given 5 mg of hydralazine x2 and 10 mg of labetalol. - Amlodipine was given and her blood pressure has been controlled since - Cardene drip was ordered but patient never needed it after her amlodipine was administered - There has been no evidence of endorgan damage - Continue to monitor  Acid reflux disease - Continue Pepcid-which has not helped her lower abdominal pain      Advance Care Planning:   Code Status: Full Code  Consults:  None  Family Communication: No family at Bedside  Severity of Illness: The appropriate patient status for this patient is OBSERVATION.  Observation status is judged to be reasonable and necessary in order to provide the required intensity of service to ensure the patient's safety. The patient's presenting symptoms, physical exam findings, and initial radiographic and laboratory data in the context of their medical condition is felt to place them at decreased risk for further clinical deterioration. Furthermore, it is anticipated that the patient will be medically stable for discharge from the hospital within 2 midnights of admission.   Author: Rolla Plate, DO 08/02/2022 6:03 AM  For on call review [email or web address].

## 2022-08-02 NOTE — Discharge Summary (Signed)
Physician Discharge Summary  Kayla Terry OBS:[id number] DOB: 13-Jan-1946 DOA: 08/01/2022  PCP: Christain Sacramento, MD  Admit date: 08/01/2022  Discharge date: 08/02/2022  Admitted From:Home  Disposition:  Home  Recommendations for Outpatient Follow-up:  Follow up with PCP in 1-2 weeks Follow-up with GI outpatient regarding abdominal pain and unintentional weight loss as recommended, patient does have a gastroenterologist at [facility] with cardiologist and remain on amlodipine 5 mg daily for blood pressure management as prescribed Continue other home medications as prior  Home Health: None  Equipment/Devices: None  Discharge Condition:Stable  CODE STATUS: Full  Diet recommendation: Heart Healthy  Brief/Interim Summary: Kayla Terry is a 76 y.o. female with medical history significant of allergies, B12 deficiency, coronary artery disease, GERD, fatty liver, hypertension, Mobitz type II second-degree atrioventricular block, and more presents ED with a chief complaint of high blood pressure.  Patient reports that several years ago her blood pressure was high and she was started on amlodipine while she was in the hospital.  When she followed up with her cardiologist she took her off of the amlodipine and told her he was worried about her blood pressure dropping too low.   She was admitted for symptomatic hypertensive urgency and was about to start Cardene drip, but never actually required it.  She was started on amlodipine 5 mg daily and appears to be tolerating this well with good blood pressure management noted.  She is no longer symptomatic from this.  She does have ongoing chronic abdominal pain and CT scan does not demonstrate any acute findings, nor does she have any convincing findings of UTI despite the fact that she was given 1 dose of IV Rocephin.  She needs close follow-up with her gastroenterologist to further investigate the cause of this abdominal pain, but she is  otherwise asymptomatic at this time and stable for discharge.  Discharge Diagnoses:  Principal Problem:   Hypertensive urgency Active Problems:   Acid reflux disease   Benign essential hypertension   Coronary artery disease   Abdominal pain  Principal discharge diagnosis: Symptomatic hypertensive urgency.  Discharge Instructions  Discharge Instructions     Diet - low sodium heart healthy   Complete by: As directed    Increase activity slowly   Complete by: As directed       Allergies as of 08/02/2022       Reactions   Escitalopram Nausea Only, Other (See Comments)   Nausea and dizziness   Pantoprazole Other (See Comments)   Severe headaches   Naproxen Sodium Anxiety        Medication List     TAKE these medications    amLODipine 5 MG tablet Commonly known as: NORVASC Take 1 tablet (5 mg total) by mouth daily. Start taking on: August 03, 2022   aspirin EC 81 MG tablet Take 81 mg by mouth daily. Swallow whole.   aspirin-acetaminophen-caffeine 250-250-65 MG tablet Commonly known as: EXCEDRIN MIGRAINE Take 2 tablets by mouth every 6 (six) hours as needed for headache.   famotidine 20 MG tablet Commonly known as: PEPCID Take 20 mg by mouth daily.   gabapentin 100 MG capsule Commonly known as: NEURONTIN Take 1-3 capsules by mouth 2 (two) times daily as needed. 1 capsule in the morning and 2 capsules in afternoon as needed for leg pain   gabapentin 300 MG capsule Commonly known as: NEURONTIN Take 300 mg by mouth 3 (three) times daily as needed.   LORazepam 0.5 MG tablet Commonly known  as: ATIVAN Take 0.5 mg by mouth 2 (two) times daily as needed for anxiety.   SYSTANE OP Place 1 drop into both eyes 2 (two) times daily as needed (itching).   traZODone 100 MG tablet Commonly known as: DESYREL Take 50 mg by mouth at bedtime.   zolpidem 10 MG tablet Commonly known as: AMBIEN Take 10 mg by mouth every evening.        Follow-up Information      [name], MD. Schedule an appointment as soon as possible for a visit in 1 week(s).   Specialty: Family Medicine Contact information: [address] [place] [address] [phone]         Marinus Maw, MD. Schedule an appointment as soon as possible for a visit in 2 week(s).   Specialty: Cardiology Contact information: [address] [place] [address] [phone]         [facility]. Go to.   Contact information: [address] [place] [address] [phone]               Allergies  Allergen Reactions   Escitalopram Nausea Only and Other (See Comments)    Nausea and dizziness   Pantoprazole Other (See Comments)    Severe headaches    Naproxen Sodium Anxiety    Consultations: None   Procedures/Studies: CT ABDOMEN PELVIS WO CONTRAST  Result Date: 08/02/2022 CLINICAL DATA:  Acute on chronic low pelvic pain.  Unclear etiology. EXAM: CT ABDOMEN AND PELVIS WITHOUT CONTRAST TECHNIQUE: Multidetector CT imaging of the abdomen and pelvis was performed following the standard protocol without IV contrast. RADIATION DOSE REDUCTION: This exam was performed according to the departmental dose-optimization program which includes automated exposure control, adjustment of the mA and/or kV according to patient size and/or use of iterative reconstruction technique. COMPARISON:  CT studies both with IV contrast, 01/13/2022 and 10/20/2021. FINDINGS: Lower chest: Increased linear atelectatic foci both lung bases but no infiltrate is seen. Sternal pectus deformity impresses on and deforms and partially effaces the right atrium of the heart. The heart is slightly enlarged. There is no pericardial effusion. Hepatobiliary: No focal liver abnormality is seen without contrast. The gallbladder and bile ducts are unremarkable. Pancreas: No focal abnormality is seen without contrast. Spleen: No focal abnormality is seen without  contrast. No splenomegaly. Adrenals/Urinary Tract: There is no adrenal mass or focal abnormality in the unenhanced renal cortex. There is no urinary stone or obstruction. No thickening of the bladder. Stomach/Bowel: The gastric wall and unopacified small bowel are unremarkable. An appendix is not seen in this patient. The large bowel wall is normal in thickness. There are diverticula along the left-sided colon without evidence of diverticulitis. These are most numerous in the sigmoid segment. Vascular/Lymphatic: Aortic atherosclerosis. No enlarged abdominal or pelvic lymph nodes. Reproductive: Status post hysterectomy. No adnexal masses. Other: There is no incarcerated hernia. There is no free air, free hemorrhage or free fluid. Musculoskeletal: There is osteopenia with degenerative facet hypertrophy and mild spondylosis of lumbar spine. Greatest facet hypertrophy at L4-5 and L5-S1. Acquired spinal stenosis is again noted at L4-5. IMPRESSION: 1. No acute noncontrast CT abnormality or interval changes to suggest an acute explanation for the patient's symptoms. 2. Diverticulosis without evidence of acute diverticulitis. 3. Aortic atherosclerosis. 4. Osteopenia and degenerative change. 5. 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 Electronically Signed   By: Almira Bar M.D.   On: 08/02/2022 05:01   CT Head Wo Contrast  Result Date: 08/01/2022 CLINICAL DATA:  Hypertension, unsteady gait EXAM: CT HEAD WITHOUT CONTRAST TECHNIQUE: Contiguous axial images were obtained from the base of the skull through the vertex without intravenous contrast. RADIATION DOSE REDUCTION: This exam was performed according to the departmental dose-optimization program which includes automated exposure control, adjustment of the mA and/or kV according to patient size and/or use of iterative reconstruction technique. COMPARISON:  CT head dated 01/26/2022 FINDINGS: Brain: No evidence of acute infarction, hemorrhage,  hydrocephalus, extra-axial collection or mass lesion/mass effect. Mild subcortical white matter and periventricular small vessel ischemic changes. Vascular: No hyperdense vessel or unexpected calcification. Skull: Normal. Negative for fracture or focal lesion. Sinuses/Orbits: The visualized paranasal sinuses are essentially clear. The mastoid air cells are unopacified. Other: None. IMPRESSION: No evidence of acute intracranial abnormality. Mild small vessel ischemic changes. Electronically Signed   By: Charline Bills M.D.   On: 08/01/2022 20:23   US PELVIC COMPLETE WITH TRANSVAGINAL  Result Date: 08/01/2022 CLINICAL DATA:  Pelvic pressure in a female EXAM: TRANSABDOMINAL AND TRANSVAGINAL ULTRASOUND OF PELVIS TECHNIQUE: Both transabdominal and transvaginal ultrasound examinations of the pelvis were performed. Transabdominal technique was performed for global imaging of the pelvis including uterus, ovaries, adnexal regions, and pelvic cul-de-sac. It was necessary to proceed with endovaginal exam following the transabdominal exam to visualize the ovary and adnexa. COMPARISON:  CT abdomen and pelvis 01/13/2022 FINDINGS: Uterus Surgically absent Endometrium Surgically absent Right ovary Not visualized, question surgically absent versus obscured by bowel Left ovary Not visualized, question surgically absent versus obscured by bowel Other findings Small amount of nonspecific free pelvic fluid.  No adnexal masses. IMPRESSION: Post hysterectomy with nonvisualization of ovaries. Small amount of nonspecific free pelvic fluid. Electronically Signed   By: Ulyses Southward M.D.   On: 08/01/2022 16:36     Discharge Exam: Vitals:   08/02/22 0745 08/02/22 1056  BP: (!) 146/86 (!) 141/85  Pulse: 76 89  Resp: 16 18  Temp: 98.3 F (36.8 C) 98.2 F (36.8 C)  SpO2: 98% 97%   Vitals:   08/02/22 0608 08/02/22 0647 08/02/22 0745 08/02/22 1056  BP: 110/68 (!) 144/90 (!) 146/86 (!) 141/85  Pulse: 75 70 76 89  Resp: 18 16  16 18   Temp: 98 F (36.7 C) 98 F (36.7 C) 98.3 F (36.8 C) 98.2 F (36.8 C)  TempSrc: Oral  Oral Oral  SpO2: 97%  98% 97%  Weight:  58.5 kg    Height:  5\' 3"  (1.6 m)      General: Pt is alert, awake, not in acute distress Cardiovascular: RRR, S1/S2 +, no rubs, no gallops Respiratory: CTA bilaterally, no wheezing, no rhonchi Abdominal: Soft, NT, ND, bowel sounds + Extremities: no edema, no cyanosis    The results of significant diagnostics from this hospitalization (including imaging, microbiology, ancillary and laboratory) are listed below for reference.     Microbiology: Recent Results (from the past 240 hour(s))  Wet prep, genital     Status: Abnormal   Collection Time: 08/01/22  6:06 PM   Specimen: Cervix  Result Value Ref Range Status   Yeast Wet Prep HPF POC NONE SEEN NONE SEEN Final   Trich, Wet Prep NONE SEEN NONE SEEN Final   Clue Cells Wet Prep HPF POC NONE SEEN NONE SEEN Final   WBC, Wet Prep HPF POC >=10 (A) <10 Final   Sperm NONE SEEN  Final    Comment: Performed at [facility], [address] [place], [address] [place]  Labs: BNP (last 3 results) Recent Labs    08/05/21 0831 03/04/22 0811  BNP 49.0 69.0   Basic Metabolic Panel: Recent Labs  Lab 08/01/22 1123 08/02/22 0318  NA 136 138  K 3.9 3.7  CL 100 105  CO2 26 25  GLUCOSE 113* 111*  BUN 7* 8  CREATININE 0.90 0.75  CALCIUM 9.2 9.5  MG  --  2.1   Liver Function Tests: Recent Labs  Lab 08/01/22 1123 08/02/22 0318  AST 17 16  ALT 12 12  ALKPHOS 74 66  BILITOT 0.5 0.7  PROT 7.1 6.7  ALBUMIN 4.2 3.9   Recent Labs  Lab 08/01/22 1123  LIPASE 28   No results for input(s): "AMMONIA" in the last 168 hours. CBC: Recent Labs  Lab 08/01/22 1123 08/02/22 0318  WBC 7.6 7.6  NEUTROABS  --  5.5  HGB 14.0 13.5  HCT 42.4 40.1  MCV 92.8 91.8  PLT 276 288   Cardiac Enzymes: No results for input(s): "CKTOTAL", "CKMB", "CKMBINDEX", "TROPONINI" in the last 168  hours. BNP: Invalid input(s): "POCBNP" CBG: No results for input(s): "GLUCAP" in the last 168 hours. D-Dimer No results for input(s): "DDIMER" in the last 72 hours. Hgb A1c No results for input(s): "HGBA1C" in the last 72 hours. Lipid Profile No results for input(s): "CHOL", "HDL", "LDLCALC", "TRIG", "CHOLHDL", "LDLDIRECT" in the last 72 hours. Thyroid function studies No results for input(s): "TSH", "T4TOTAL", "T3FREE", "THYROIDAB" in the last 72 hours.  Invalid input(s): "FREET3" Anemia work up No results for input(s): "VITAMINB12", "FOLATE", "FERRITIN", "TIBC", "IRON", "RETICCTPCT" in the last 72 hours. Urinalysis    Component Value Date/Time   COLORURINE COLORLESS (A) 08/01/2022 1640   APPEARANCEUR CLEAR 08/01/2022 1640   LABSPEC 1.002 (L) 08/01/2022 1640   PHURINE 8.0 08/01/2022 1640   GLUCOSEU NEGATIVE 08/01/2022 1640   HGBUR NEGATIVE 08/01/2022 1640   BILIRUBINUR NEGATIVE 08/01/2022 1640   KETONESUR NEGATIVE 08/01/2022 1640   PROTEINUR NEGATIVE 08/01/2022 1640   UROBILINOGEN 0.2 01/29/2012 0938   NITRITE NEGATIVE 08/01/2022 1640   LEUKOCYTESUR SMALL (A) 08/01/2022 1640   Sepsis Labs Recent Labs  Lab 08/01/22 1123 08/02/22 0318  WBC 7.6 7.6   Microbiology Recent Results (from the past 240 hour(s))  Wet prep, genital     Status: Abnormal   Collection Time: 08/01/22  6:06 PM   Specimen: Cervix  Result Value Ref Range Status   Yeast Wet Prep HPF POC NONE SEEN NONE SEEN Final   Trich, Wet Prep NONE SEEN NONE SEEN Final   Clue Cells Wet Prep HPF POC NONE SEEN NONE SEEN Final   WBC, Wet Prep HPF POC >=10 (A) <10 Final   Sperm NONE SEEN  Final    Comment: Performed at [facility], [address], [place], [place] [address]     Time coordinating discharge: 35 minutes  SIGNED:   Erick Blinks, DO [facility] 08/02/2022, 1:31 PM  If 7PM-7AM, please contact night-coverage [email or web address]

## 2022-08-02 NOTE — TOC Progression Note (Signed)
  Transition of Care [facility]) Screening Note   Patient Details  Name: Kayla Terry Date of Birth: 19-Mar-1946   Transition of Care [facility]) CM/SW Contact:    Boneta Lucks, RN Phone Number: 08/02/2022, 1:22 PM    Transition of Care Department [facility]) has reviewed patient and no TOC needs have been identified at this time. We will continue to monitor patient advancement through interdisciplinary progression rounds. If new patient transition needs arise, please place a TOC consult.    Expected Discharge Plan: Home/Self Care Barriers to Discharge: Continued Medical Work up  Expected Discharge Plan and Services Expected Discharge Plan: Home/Self Care

## 2022-08-02 NOTE — Assessment & Plan Note (Signed)
Continue aspirin 

## 2022-08-02 NOTE — Assessment & Plan Note (Signed)
-   Patient reports she was taken off of Norvasc several years ago - Today her blood pressure was quite high and at admission she had been given 5 mg of hydralazine x2 and 10 mg of labetalol. - Amlodipine was given and her blood pressure has been controlled since - Cardene drip was ordered but patient never needed it after her amlodipine was administered - There has been no evidence of endorgan damage - Continue to monitor

## 2022-08-04 LAB — URINE CULTURE: Culture: NO GROWTH

## 2022-10-19 ENCOUNTER — Inpatient Hospital Stay (HOSPITAL_COMMUNITY): Payer: Medicare Other

## 2022-10-19 ENCOUNTER — Other Ambulatory Visit: Payer: Self-pay

## 2022-10-19 ENCOUNTER — Encounter (HOSPITAL_COMMUNITY): Payer: Self-pay | Admitting: *Deleted

## 2022-10-19 ENCOUNTER — Inpatient Hospital Stay (HOSPITAL_COMMUNITY)
Admission: EM | Admit: 2022-10-19 | Discharge: 2022-10-21 | DRG: 391 | Disposition: A | Discharge status: Home Health | Payer: Medicare Other | Attending: Family Medicine | Admitting: Family Medicine

## 2022-10-19 ENCOUNTER — Emergency Department (HOSPITAL_COMMUNITY): Payer: Medicare Other

## 2022-10-19 DIAGNOSIS — E8809 Other disorders of plasma-protein metabolism, not elsewhere classified: Secondary | ICD-10-CM | POA: Diagnosis present

## 2022-10-19 DIAGNOSIS — M50021 Cervical disc disorder at C4-C5 level with myelopathy: Secondary | ICD-10-CM | POA: Diagnosis present

## 2022-10-19 DIAGNOSIS — I6389 Other cerebral infarction: Secondary | ICD-10-CM

## 2022-10-19 DIAGNOSIS — D72829 Elevated white blood cell count, unspecified: Secondary | ICD-10-CM | POA: Diagnosis not present

## 2022-10-19 DIAGNOSIS — K760 Fatty (change of) liver, not elsewhere classified: Secondary | ICD-10-CM | POA: Diagnosis present

## 2022-10-19 DIAGNOSIS — I639 Cerebral infarction, unspecified: Secondary | ICD-10-CM | POA: Diagnosis present

## 2022-10-19 DIAGNOSIS — Z801 Family history of malignant neoplasm of trachea, bronchus and lung: Secondary | ICD-10-CM | POA: Diagnosis not present

## 2022-10-19 DIAGNOSIS — R29700 NIHSS score 0: Secondary | ICD-10-CM | POA: Diagnosis present

## 2022-10-19 DIAGNOSIS — Z888 Allergy status to other drugs, medicaments and biological substances status: Secondary | ICD-10-CM | POA: Diagnosis not present

## 2022-10-19 DIAGNOSIS — Z1152 Encounter for screening for COVID-19: Secondary | ICD-10-CM

## 2022-10-19 DIAGNOSIS — Z806 Family history of leukemia: Secondary | ICD-10-CM | POA: Diagnosis not present

## 2022-10-19 DIAGNOSIS — E785 Hyperlipidemia, unspecified: Secondary | ICD-10-CM | POA: Diagnosis present

## 2022-10-19 DIAGNOSIS — G2581 Restless legs syndrome: Secondary | ICD-10-CM | POA: Diagnosis present

## 2022-10-19 DIAGNOSIS — Z811 Family history of alcohol abuse and dependence: Secondary | ICD-10-CM

## 2022-10-19 DIAGNOSIS — E538 Deficiency of other specified B group vitamins: Secondary | ICD-10-CM | POA: Diagnosis present

## 2022-10-19 DIAGNOSIS — Z79899 Other long term (current) drug therapy: Secondary | ICD-10-CM | POA: Diagnosis not present

## 2022-10-19 DIAGNOSIS — Z820 Family history of epilepsy and other diseases of the nervous system: Secondary | ICD-10-CM | POA: Diagnosis not present

## 2022-10-19 DIAGNOSIS — I2510 Atherosclerotic heart disease of native coronary artery without angina pectoris: Secondary | ICD-10-CM | POA: Diagnosis present

## 2022-10-19 DIAGNOSIS — K5732 Diverticulitis of large intestine without perforation or abscess without bleeding: Secondary | ICD-10-CM | POA: Diagnosis not present

## 2022-10-19 DIAGNOSIS — E778 Other disorders of glycoprotein metabolism: Secondary | ICD-10-CM | POA: Diagnosis present

## 2022-10-19 DIAGNOSIS — K5720 Diverticulitis of large intestine with perforation and abscess without bleeding: Principal | ICD-10-CM | POA: Diagnosis present

## 2022-10-19 DIAGNOSIS — E876 Hypokalemia: Secondary | ICD-10-CM | POA: Diagnosis present

## 2022-10-19 DIAGNOSIS — I441 Atrioventricular block, second degree: Secondary | ICD-10-CM | POA: Diagnosis present

## 2022-10-19 DIAGNOSIS — Z807 Family history of other malignant neoplasms of lymphoid, hematopoietic and related tissues: Secondary | ICD-10-CM | POA: Diagnosis not present

## 2022-10-19 DIAGNOSIS — Z8616 Personal history of COVID-19: Secondary | ICD-10-CM | POA: Diagnosis not present

## 2022-10-19 DIAGNOSIS — I10 Essential (primary) hypertension: Secondary | ICD-10-CM | POA: Diagnosis present

## 2022-10-19 DIAGNOSIS — M6281 Muscle weakness (generalized): Secondary | ICD-10-CM | POA: Diagnosis present

## 2022-10-19 DIAGNOSIS — K219 Gastro-esophageal reflux disease without esophagitis: Secondary | ICD-10-CM | POA: Diagnosis present

## 2022-10-19 DIAGNOSIS — M5000 Cervical disc disorder with myelopathy, unspecified cervical region: Secondary | ICD-10-CM | POA: Diagnosis present

## 2022-10-19 DIAGNOSIS — M4802 Spinal stenosis, cervical region: Secondary | ICD-10-CM | POA: Diagnosis present

## 2022-10-19 DIAGNOSIS — K5792 Diverticulitis of intestine, part unspecified, without perforation or abscess without bleeding: Secondary | ICD-10-CM | POA: Diagnosis present

## 2022-10-19 DIAGNOSIS — E782 Mixed hyperlipidemia: Secondary | ICD-10-CM | POA: Diagnosis not present

## 2022-10-19 LAB — CBC WITH DIFFERENTIAL/PLATELET
Abs Immature Granulocytes: 0.06 10*3/uL (ref 0.00–0.07)
Basophils Absolute: 0.1 10*3/uL (ref 0.0–0.1)
Basophils Relative: 1 %
Eosinophils Absolute: 0 10*3/uL (ref 0.0–0.5)
Eosinophils Relative: 0 %
HCT: 43.4 % (ref 36.0–46.0)
Hemoglobin: 14.2 g/dL (ref 12.0–15.0)
Immature Granulocytes: 1 %
Lymphocytes Relative: 6 %
Lymphs Abs: 0.9 10*3/uL (ref 0.7–4.0)
MCH: 29.3 pg (ref 26.0–34.0)
MCHC: 32.7 g/dL (ref 30.0–36.0)
MCV: 89.7 fL (ref 80.0–100.0)
Monocytes Absolute: 1 10*3/uL (ref 0.1–1.0)
Monocytes Relative: 8 %
Neutro Abs: 11.3 10*3/uL — ABNORMAL HIGH (ref 1.7–7.7)
Neutrophils Relative %: 84 %
Platelets: 274 10*3/uL (ref 150–400)
RBC: 4.84 MIL/uL (ref 3.87–5.11)
RDW: 12.7 % (ref 11.5–15.5)
WBC: 13.3 10*3/uL — ABNORMAL HIGH (ref 4.0–10.5)
nRBC: 0 % (ref 0.0–0.2)

## 2022-10-19 LAB — COMPREHENSIVE METABOLIC PANEL
ALT: 15 U/L (ref 0–44)
AST: 21 U/L (ref 15–41)
Albumin: 4.2 g/dL (ref 3.5–5.0)
Alkaline Phosphatase: 96 U/L (ref 38–126)
Anion gap: 11 (ref 5–15)
BUN: 8 mg/dL (ref 8–23)
CO2: 27 mmol/L (ref 22–32)
Calcium: 9.7 mg/dL (ref 8.9–10.3)
Chloride: 100 mmol/L (ref 98–111)
Creatinine, Ser: 0.93 mg/dL (ref 0.44–1.00)
GFR, Estimated: >60 mL/min (ref >60)
Glucose, Bld: 118 mg/dL — ABNORMAL HIGH (ref 70–99)
Potassium: 3.6 mmol/L (ref 3.5–5.1)
Sodium: 138 mmol/L (ref 135–145)
Total Bilirubin: 0.7 mg/dL (ref 0.3–1.2)
Total Protein: 7.8 g/dL (ref 6.5–8.1)

## 2022-10-19 LAB — URINALYSIS, ROUTINE W REFLEX MICROSCOPIC
Bilirubin Urine: NEGATIVE
Glucose, UA: NEGATIVE mg/dL
Hgb urine dipstick: NEGATIVE
Ketones, ur: NEGATIVE mg/dL
Leukocytes,Ua: NEGATIVE
Nitrite: NEGATIVE
Protein, ur: NEGATIVE mg/dL
Specific Gravity, Urine: 1.002 — ABNORMAL LOW (ref 1.005–1.030)
pH: 6 (ref 5.0–8.0)

## 2022-10-19 LAB — ECHOCARDIOGRAM COMPLETE
AR max vel: 2.23 cm2
AV Area VTI: 2.1 cm2
AV Area mean vel: 2.27 cm2
AV Mean grad: 4 mmHg
AV Peak grad: 7.4 mmHg
Ao pk vel: 1.36 m/s
Area-P 1/2: 3.34 cm2
Height: 63 in
MV VTI: 2.83 cm2
S' Lateral: 1.9 cm
Weight: 2000 oz

## 2022-10-19 LAB — LIPID PANEL
Cholesterol: 276 mg/dL — ABNORMAL HIGH (ref 0–200)
HDL: 98 mg/dL (ref >40)
LDL Cholesterol: 162 mg/dL — ABNORMAL HIGH (ref 0–99)
Total CHOL/HDL Ratio: 2.8 RATIO
Triglycerides: 81 mg/dL (ref <150)
VLDL: 16 mg/dL (ref 0–40)

## 2022-10-19 LAB — RESP PANEL BY RT-PCR (RSV, FLU A&B, COVID)  RVPGX2
Influenza A by PCR: NEGATIVE
Influenza B by PCR: NEGATIVE
Resp Syncytial Virus by PCR: NEGATIVE
SARS Coronavirus 2 by RT PCR: NEGATIVE

## 2022-10-19 LAB — LIPASE, BLOOD: Lipase: 29 U/L (ref 11–51)

## 2022-10-19 MED ORDER — ONDANSETRON HCL 4 MG PO TABS: 4.0000 mg | ORAL_TABLET | Freq: Four times a day (QID) | ORAL | Status: DC | PRN

## 2022-10-19 MED ORDER — STROKE: EARLY STAGES OF RECOVERY BOOK
Freq: Once | Status: AC
  Filled 2022-10-19: qty 1

## 2022-10-19 MED ORDER — BISACODYL 5 MG PO TBEC
5.0000 mg | DELAYED_RELEASE_TABLET | Freq: Every day | ORAL | Status: DC | PRN
  Administered 2022-10-21: 5 mg via ORAL
  Filled 2022-10-19: qty 1

## 2022-10-19 MED ORDER — PERFLUTREN LIPID MICROSPHERE
1.0000 mL | INTRAVENOUS | Status: AC | PRN
  Administered 2022-10-19: 5 mL via INTRAVENOUS

## 2022-10-19 MED ORDER — AMLODIPINE BESYLATE 5 MG PO TABS
5.0000 mg | ORAL_TABLET | Freq: Every day | ORAL | Status: DC
  Administered 2022-10-19 – 2022-10-21 (×3): 5 mg via ORAL
  Filled 2022-10-19 (×3): qty 1

## 2022-10-19 MED ORDER — TRAZODONE HCL 50 MG PO TABS
50.0000 mg | ORAL_TABLET | Freq: Every day | ORAL | Status: DC
  Administered 2022-10-19 – 2022-10-20 (×2): 50 mg via ORAL
  Filled 2022-10-19 (×2): qty 1

## 2022-10-19 MED ORDER — LACTATED RINGERS IV BOLUS
1000.0000 mL | Freq: Once | INTRAVENOUS | Status: AC
  Administered 2022-10-19: 1000 mL via INTRAVENOUS

## 2022-10-19 MED ORDER — MORPHINE SULFATE (PF) 4 MG/ML IV SOLN
1.0000 mg | INTRAVENOUS | Status: DC | PRN
  Administered 2022-10-19: 1 mg via INTRAVENOUS
  Filled 2022-10-19: qty 1

## 2022-10-19 MED ORDER — METRONIDAZOLE 500 MG/100ML IV SOLN
500.0000 mg | Freq: Once | INTRAVENOUS | Status: AC
  Administered 2022-10-19: 500 mg via INTRAVENOUS
  Filled 2022-10-19: qty 100

## 2022-10-19 MED ORDER — FAMOTIDINE 20 MG PO TABS
20.0000 mg | ORAL_TABLET | Freq: Every day | ORAL | Status: DC
  Administered 2022-10-19 – 2022-10-21 (×3): 20 mg via ORAL
  Filled 2022-10-19 (×3): qty 1

## 2022-10-19 MED ORDER — ASPIRIN 325 MG PO TABS
325.0000 mg | ORAL_TABLET | Freq: Every day | ORAL | Status: DC
  Administered 2022-10-19 – 2022-10-20 (×2): 325 mg via ORAL
  Filled 2022-10-19 (×2): qty 1

## 2022-10-19 MED ORDER — METRONIDAZOLE 500 MG/100ML IV SOLN
500.0000 mg | Freq: Two times a day (BID) | INTRAVENOUS | Status: DC
  Administered 2022-10-19 – 2022-10-21 (×4): 500 mg via INTRAVENOUS
  Filled 2022-10-19 (×4): qty 100

## 2022-10-19 MED ORDER — LACTATED RINGERS IV SOLN: INTRAVENOUS | Status: DC

## 2022-10-19 MED ORDER — OXYCODONE HCL 5 MG PO TABS
5.0000 mg | ORAL_TABLET | Freq: Four times a day (QID) | ORAL | Status: DC | PRN
  Administered 2022-10-19 – 2022-10-20 (×2): 5 mg via ORAL
  Filled 2022-10-19 (×2): qty 1

## 2022-10-19 MED ORDER — ACETAMINOPHEN 650 MG RE SUPP: 650.0000 mg | Freq: Four times a day (QID) | RECTAL | Status: DC | PRN

## 2022-10-19 MED ORDER — GABAPENTIN 300 MG PO CAPS
300.0000 mg | ORAL_CAPSULE | Freq: Three times a day (TID) | ORAL | Status: DC | PRN
  Administered 2022-10-20: 300 mg via ORAL
  Filled 2022-10-19: qty 1

## 2022-10-19 MED ORDER — ENOXAPARIN SODIUM 40 MG/0.4ML IJ SOSY
40.0000 mg | PREFILLED_SYRINGE | INTRAMUSCULAR | Status: DC
  Administered 2022-10-20: 40 mg via SUBCUTANEOUS
  Filled 2022-10-19 (×2): qty 0.4

## 2022-10-19 MED ORDER — MORPHINE SULFATE (PF) 4 MG/ML IV SOLN
2.0000 mg | INTRAVENOUS | Status: DC | PRN
  Administered 2022-10-19: 2 mg via INTRAVENOUS
  Filled 2022-10-19: qty 1

## 2022-10-19 MED ORDER — IOHEXOL 300 MG/ML  SOLN
100.0000 mL | Freq: Once | INTRAMUSCULAR | Status: AC | PRN
  Administered 2022-10-19: 100 mL via INTRAVENOUS

## 2022-10-19 MED ORDER — ACETAMINOPHEN 325 MG PO TABS: 650.0000 mg | ORAL_TABLET | Freq: Four times a day (QID) | ORAL | Status: DC | PRN

## 2022-10-19 MED ORDER — HYDRALAZINE HCL 25 MG PO TABS
25.0000 mg | ORAL_TABLET | Freq: Three times a day (TID) | ORAL | Status: DC
  Administered 2022-10-19 – 2022-10-21 (×6): 25 mg via ORAL
  Filled 2022-10-19 (×5): qty 1

## 2022-10-19 MED ORDER — CIPROFLOXACIN IN D5W 400 MG/200ML IV SOLN
400.0000 mg | Freq: Once | INTRAVENOUS | Status: AC
  Administered 2022-10-19: 400 mg via INTRAVENOUS
  Filled 2022-10-19: qty 200

## 2022-10-19 MED ORDER — ONDANSETRON HCL 4 MG/2ML IJ SOLN
4.0000 mg | Freq: Four times a day (QID) | INTRAMUSCULAR | Status: DC | PRN
  Administered 2022-10-19 – 2022-10-20 (×2): 4 mg via INTRAVENOUS
  Filled 2022-10-19 (×2): qty 2

## 2022-10-19 MED ORDER — ASPIRIN 300 MG RE SUPP: 300.0000 mg | Freq: Every day | RECTAL | Status: DC

## 2022-10-19 MED ORDER — HYDRALAZINE HCL 20 MG/ML IJ SOLN: 10.0000 mg | INTRAMUSCULAR | Status: DC | PRN

## 2022-10-19 MED ORDER — ZOLPIDEM TARTRATE 5 MG PO TABS
5.0000 mg | ORAL_TABLET | Freq: Every evening | ORAL | Status: DC
  Administered 2022-10-19 – 2022-10-20 (×2): 5 mg via ORAL
  Filled 2022-10-19 (×2): qty 1

## 2022-10-19 MED ORDER — CIPROFLOXACIN IN D5W 400 MG/200ML IV SOLN
400.0000 mg | Freq: Two times a day (BID) | INTRAVENOUS | Status: DC
  Administered 2022-10-19 – 2022-10-21 (×4): 400 mg via INTRAVENOUS
  Filled 2022-10-19 (×4): qty 200

## 2022-10-19 MED ORDER — IOHEXOL 350 MG/ML SOLN
60.0000 mL | Freq: Once | INTRAVENOUS | Status: AC | PRN
  Administered 2022-10-19: 60 mL via INTRAVENOUS

## 2022-10-19 MED ORDER — ATORVASTATIN CALCIUM 40 MG PO TABS
80.0000 mg | ORAL_TABLET | Freq: Every evening | ORAL | Status: DC
  Administered 2022-10-19: 80 mg via ORAL
  Filled 2022-10-19: qty 2

## 2022-10-19 NOTE — Progress Notes (Signed)
*  PRELIMINARY RESULTS* Echocardiogram 2D Echocardiogram has been performed.  Kayla Terry 10/19/2022, 3:28 PM

## 2022-10-19 NOTE — Hospital Course (Signed)
76 year old female with history of GERD, hypertension, hyperlipidemia, Mobitz type II second-degree AV block, hiatal hernia, fatty liver, coronary artery disease, deficiency and seasonal allergies presented to the ED complaining of right arm and hand numbness that started approximately 4 to 5 days ago.  She reports that she initially was unable to hold onto things or grabs things and was dropping objects frequently.  She also started noticing numbness to the left hand and left upper thigh.  She did not seek medical care at that time.  She presents today complaining of abdominal cramping in the left upper quadrant area.  She reports that the pain seems to be worse with urination.  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  It was noted to be 9 mm in size.  She was seen by Dr. Lovell Sheehan with general surgery who recommended conservative management with IV antibiotics and bowel rest.  Will also need to undergo a colonoscopy outpatient when medically stabilized.  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  She is being admitted for further management, IV antibiotics and workup for possible acute CVA.

## 2022-10-19 NOTE — Consult Note (Signed)
Reason for Consult: Abdominal pain, sigmoid diverticulitis Referring Physician: Dr. Greer Pickerel Kayla Terry is an 76 y.o. female.  HPI: Patient is a 76 year old white female recently discharged from [facility] due to a hypertensive crisis who presented with right sided arm and foot weakness that started 4 days ago.  She also complained of lower abdominal pain.  A CT scan of the abdomen pelvis revealed a small sigmoid diverticular perforation that seems to be contained.  It was only 9 mm in size.  She states she had a normal bowel movement yesterday but today it was a little less.  She denies any fever or chills.  She does not remember having undergone a colonoscopy.  She was referred to [facility] for further management of her lower abdominal pain.  Past Medical History:  Diagnosis Date   Allergy    B12 deficiency 04/2020   Cataract    Coronary artery disease    COVID-19 04/22/2021   Fatty liver    GERD (gastroesophageal reflux disease)    Herpes zoster without complications    Hiatal hernia    Hypertension    Mild mitral regurgitation 2013   Mobitz type 2 second degree atrioventricular block    Pectus excavatum    pectus excavatum deformity resulting in mass effect on the RV by CT 03/2020   Pulmonary nodule    Shingles     Past Surgical History:  Procedure Laterality Date   ABDOMINAL HYSTERECTOMY  02/1984   BACK SURGERY     CATARACT EXTRACTION, BILATERAL     LEFT HEART CATH AND CORONARY ANGIOGRAPHY N/A 04/22/2021   Procedure: LEFT HEART CATH AND CORONARY ANGIOGRAPHY;  Surgeon: Burnell Blanks, MD;  Location: [facility];  Service: Cardiovascular;  Laterality: N/A;   LUMBAR LAMINECTOMY     OOPHORECTOMY  02/29/1984   unilateral    Family History  Problem Relation Age of Onset   Cancer Mother    Leukemia Mother    Lung cancer Father    Multiple myeloma Father    Multiple sclerosis Sister    Alcohol abuse Brother    Multiple myeloma Brother    Colon cancer  Neg Hx    Colon polyps Neg Hx    Stomach cancer Neg Hx    Esophageal cancer Neg Hx    Neuropathy Neg Hx     Social History:  reports that she has never smoked. She has never used smokeless tobacco. She reports that she does not drink alcohol and does not use drugs.  Allergies:  Allergies  Allergen Reactions   Escitalopram Nausea Only and Other (See Comments)    Nausea and dizziness   Pantoprazole Other (See Comments)    Severe headaches    Naproxen Sodium Anxiety    Medications: Prior to Admission: (Not in a hospital admission)   Results for orders placed or performed during the hospital encounter of 10/19/22 (from the past 48 hour(s))  CBC with Differential     Status: Abnormal   Collection Time: 10/19/22  7:39 AM  Result Value Ref Range   WBC 13.3 (H) 4.0 - 10.5 K/uL   RBC 4.84 3.87 - 5.11 MIL/uL   Hemoglobin 14.2 12.0 - 15.0 g/dL   HCT 43.4 36.0 - 46.0 %   MCV 89.7 80.0 - 100.0 fL   MCH 29.3 26.0 - 34.0 pg   MCHC 32.7 30.0 - 36.0 g/dL   RDW 12.7 11.5 - 15.5 %   Platelets 274 150 - 400 K/uL  nRBC 0.0 0.0 - 0.2 %   Neutrophils Relative % 84 %   Neutro Abs 11.3 (H) 1.7 - 7.7 K/uL   Lymphocytes Relative 6 %   Lymphs Abs 0.9 0.7 - 4.0 K/uL   Monocytes Relative 8 %   Monocytes Absolute 1.0 0.1 - 1.0 K/uL   Eosinophils Relative 0 %   Eosinophils Absolute 0.0 0.0 - 0.5 K/uL   Basophils Relative 1 %   Basophils Absolute 0.1 0.0 - 0.1 K/uL   Immature Granulocytes 1 %   Abs Immature Granulocytes 0.06 0.00 - 0.07 K/uL    Comment: Performed at [facility], [address], [place], [place] [address]  Comprehensive metabolic panel     Status: Abnormal   Collection Time: 10/19/22  7:39 AM  Result Value Ref Range   Sodium 138 135 - 145 mmol/L   Potassium 3.6 3.5 - 5.1 mmol/L   Chloride 100 98 - 111 mmol/L   CO2 27 22 - 32 mmol/L   Glucose, Bld 118 (H) 70 - 99 mg/dL    Comment: Glucose reference range applies only to samples taken after fasting for at least 8 hours.   BUN  8 8 - 23 mg/dL   Creatinine, Ser 0.93 0.44 - 1.00 mg/dL   Calcium 9.7 8.9 - 10.3 mg/dL   Total Protein 7.8 6.5 - 8.1 g/dL   Albumin 4.2 3.5 - 5.0 g/dL   AST 21 15 - 41 U/L   ALT 15 0 - 44 U/L   Alkaline Phosphatase 96 38 - 126 U/L   Total Bilirubin 0.7 0.3 - 1.2 mg/dL   GFR, Estimated >60 >60 mL/min    Comment: (NOTE) Calculated using the CKD-EPI Creatinine Equation (2021)    Anion gap 11 5 - 15    Comment: Performed at [facility], [address] [place], [place] [address]  Lipase, blood     Status: None   Collection Time: 10/19/22  7:39 AM  Result Value Ref Range   Lipase 29 11 - 51 U/L    Comment: Performed at [facility], [address], [place], [place] [address]  Resp panel by RT-PCR (RSV, Flu A&B, Covid) Anterior Nasal Swab     Status: None   Collection Time: 10/19/22  8:21 AM   Specimen: Anterior Nasal Swab  Result Value Ref Range   SARS Coronavirus 2 by RT PCR NEGATIVE NEGATIVE    Comment: (NOTE) SARS-CoV-2 target nucleic acids are NOT DETECTED.  The SARS-CoV-2 RNA is generally detectable in upper respiratory specimens during the acute phase of infection. The lowest concentration of SARS-CoV-2 viral copies this assay can detect is 138 copies/mL. A negative result does not preclude SARS-Cov-2 infection and should not be used as the sole basis for treatment or other patient management decisions. A negative result may occur with  improper specimen collection/handling, submission of specimen other than nasopharyngeal swab, presence of viral mutation(s) within the areas targeted by this assay, and inadequate number of viral copies(<138 copies/mL). A negative result must be combined with clinical observations, patient history, and epidemiological information. The expected result is Negative.  Fact Sheet for Patients:  EntrepreneurPulse.com.au  Fact Sheet for Healthcare Providers:  IncredibleEmployment.be  This test is no t yet  approved or cleared by the [place] FDA and  has been authorized for detection and/or diagnosis of SARS-CoV-2 by FDA under an Emergency Use Authorization (EUA). This EUA will remain  in effect (meaning this test can be used) for the duration of the COVID-19  declaration under Section 564(b)(1) of the Act, 21 U.S.C.section 360bbb-3(b)(1), unless the authorization is terminated  or revoked sooner.       Influenza A by PCR NEGATIVE NEGATIVE   Influenza B by PCR NEGATIVE NEGATIVE    Comment: (NOTE) The Xpert Xpress SARS-CoV-2/FLU/RSV plus assay is intended as an aid in the diagnosis of influenza from Nasopharyngeal swab specimens and should not be used as a sole basis for treatment. Nasal washings and aspirates are unacceptable for Xpert Xpress SARS-CoV-2/FLU/RSV testing.  Fact Sheet for Patients: EntrepreneurPulse.com.au  Fact Sheet for Healthcare Providers: IncredibleEmployment.be  This test is not yet approved or cleared by the [place] FDA and has been authorized for detection and/or diagnosis of SARS-CoV-2 by FDA under an Emergency Use Authorization (EUA). This EUA will remain in effect (meaning this test can be used) for the duration of the COVID-19 declaration under Section 564(b)(1) of the Act, 21 U.S.C. section 360bbb-3(b)(1), unless the authorization is terminated or revoked.     Resp Syncytial Virus by PCR NEGATIVE NEGATIVE    Comment: (NOTE) Fact Sheet for Patients: EntrepreneurPulse.com.au  Fact Sheet for Healthcare Providers: IncredibleEmployment.be  This test is not yet approved or cleared by the [place] FDA and has been authorized for detection and/or diagnosis of SARS-CoV-2 by FDA under an Emergency Use Authorization (EUA). This EUA will remain in effect (meaning this test can be used) for the duration of the COVID-19 declaration under Section 564(b)(1) of the Act, 21  U.S.C. section 360bbb-3(b)(1), unless the authorization is terminated or revoked.  Performed at [facility], [address] [place], [place] [address]   Urinalysis, Routine w reflex microscopic Anterior Nasal Swab     Status: Abnormal   Collection Time: 10/19/22  8:27 AM  Result Value Ref Range   Color, Urine STRAW (A) YELLOW   APPearance CLEAR CLEAR   Specific Gravity, Urine 1.002 (L) 1.005 - 1.030   pH 6.0 5.0 - 8.0   Glucose, UA NEGATIVE NEGATIVE mg/dL   Hgb urine dipstick NEGATIVE NEGATIVE   Bilirubin Urine NEGATIVE NEGATIVE   Ketones, ur NEGATIVE NEGATIVE mg/dL   Protein, ur NEGATIVE NEGATIVE mg/dL   Nitrite NEGATIVE NEGATIVE   Leukocytes,Ua NEGATIVE NEGATIVE    Comment: Performed at [facility], [address] [place], [place] [address]    CT ABDOMEN PELVIS W CONTRAST  Result Date: 10/19/2022 CLINICAL DATA:  Acute, nonlocalized abdominal pain EXAM: CT ABDOMEN AND PELVIS WITH CONTRAST TECHNIQUE: Multidetector CT imaging of the abdomen and pelvis was performed using the standard protocol following bolus administration of intravenous contrast. RADIATION DOSE REDUCTION: This exam was performed according to the departmental dose-optimization program which includes automated exposure control, adjustment of the mA and/or kV according to patient size and/or use of iterative reconstruction technique. CONTRAST:  117m OMNIPAQUE IOHEXOL 300 MG/ML  SOLN COMPARISON:  08/02/2022 FINDINGS: Lower chest:  Pectus excavatum deforming the right heart. Hepatobiliary: No focal liver abnormality.No evidence of biliary obstruction or stone. Pancreas: Unremarkable. Spleen: Unremarkable. Adrenals/Urinary Tract: Negative adrenals. No hydronephrosis or stone. Tiny renal cystic densities. No indication of pyelonephritis. Unremarkable bladder. Stomach/Bowel: Submucosal low-density thickening involving the sigmoid colon around a thick-walled gaseous collection measuring 9 mm along the inferior and posterior wall  of the affected segment. Colonic diverticulosis is extensive. Trace pelvic fluid. No bowel obstruction Vascular/Lymphatic: Scattered atheromatous calcification of the aorta and visceral origins. No mass or adenopathy. Reproductive:Hysterectomy Other: Trace pelvic fluid.  No pneumoperitoneum. Musculoskeletal: Lumbar spine degeneration with notable facet spurring and lumbar lordosis. IMPRESSION:  Sigmoid diverticulitis with 9 mm gaseous collection that could be in or adjacent to the offending diverticulum. Electronically Signed   By: Jorje Guild M.D.   On: 10/19/2022 10:35   CT HEAD WO CONTRAST (5MM)  Result Date: 10/19/2022 CLINICAL DATA:  Status change. Right hand arm and numbness 4 days ago. EXAM: CT HEAD WITHOUT CONTRAST TECHNIQUE: Contiguous axial images were obtained from the base of the skull through the vertex without intravenous contrast. RADIATION DOSE REDUCTION: This exam was performed according to the departmental dose-optimization program which includes automated exposure control, adjustment of the mA and/or kV according to patient size and/or use of iterative reconstruction technique. COMPARISON:  CT head 08/01/2022 FINDINGS: Brain: 1 cm hypodensity left thalamus is new since the prior study compatible with infarct which may be recent. Generalized atrophy. Mild patchy white matter hypodensity bilaterally compatible with chronic microvascular ischemia. Negative for acute hemorrhage or mass Vascular: Negative for hyperdense vessel Skull: Negative Sinuses/Orbits: Paranasal sinuses clear. Bilateral cataract extraction Other: None IMPRESSION: 1. 1 cm hypodensity left thalamus compatible with infarct which may be recent. No acute hemorrhage. 2. Atrophy and chronic microvascular ischemia. Electronically Signed   By: Franchot Gallo M.D.   On: 10/19/2022 10:34    ROS:  Pertinent items are noted in HPI.  Blood pressure (!) 176/91, pulse 94, temperature 98.5 F (36.9 C), temperature source Oral, resp.  rate 18, height _0  (1.6 m), weight 56.7 kg, SpO2 95 %. Physical Exam: Well-developed well-nourished white female no acute distress Head is normocephalic, atraumatic Lungs clear to auscultation with equal breath sounds bilaterally Heart examination reveals a regular rate and rhythm without S3, S4, murmurs Abdomen is soft with some discomfort to palpation in the suprapubic and left lower quadrant regions.  No rigidity is noted.  CT scans of the head, abdomen, and pelvis all reviewed  Assessment/Plan: Impression: Sigmoid diverticulitis with contained perforation.  No significant free air noted.  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 Plan: No need for acute surgical invention at the present time.  This can be managed with antibiotics.  She may be started on a clear liquid diet.  Will follow with you.  Will need to have a colonoscopy performed as an outpatient. Kayla Terry 10/19/2022, 11:18 AM

## 2022-10-19 NOTE — ED Provider Notes (Signed)
[facility] EMERGENCY DEPARTMENT Provider Note   CSN: [id number] Arrival date & time: 10/19/22  0700     History Chief Complaint  Patient presents with   Abdominal Pain    HPI Kayla Terry is a 76 y.o. female presenting for multiple complaints.  She is a 76 year old female with an extensive medical history.  She states that throughout the day today she had abdominal cramping.  She endorses a tingling sensation diffusely on her body over the last 5 days.  Her right hand her left shoulder her right foot her left knee.  She states it feels "numb" but that she has normal feeling and that things are tingling. Denies fevers or chills, nausea vomiting, syncope or shortness of breath.  Patient otherwise ambulatory tolerating p.o. intake.  No known sick contacts.   Patient's recorded medical, surgical, social, medication list and allergies were reviewed in the Snapshot window as part of the initial history.   Review of Systems   Review of Systems  Constitutional:  Negative for chills and fever.  HENT:  Negative for ear pain and sore throat.   Eyes:  Negative for pain and visual disturbance.  Respiratory:  Negative for cough and shortness of breath.   Cardiovascular:  Negative for chest pain and palpitations.  Gastrointestinal:  Positive for abdominal pain and nausea. Negative for vomiting.  Genitourinary:  Negative for dysuria and hematuria.  Musculoskeletal:  Negative for arthralgias and back pain.  Skin:  Negative for color change and rash.  Neurological:  Negative for seizures and syncope.  All other systems reviewed and are negative.   Physical Exam Updated Vital Signs BP (!) 176/90   Pulse 87   Temp 98.5 F (36.9 C) (Oral)   Resp 18   Ht 5\' 3"  (1.6 m)   Wt 56.7 kg   SpO2 95%   BMI 22.14 kg/m  Physical Exam Vitals and nursing note reviewed.  Constitutional:      General: She is not in acute distress.    Appearance: She is well-developed.  HENT:     Head:  Normocephalic and atraumatic.  Eyes:     Conjunctiva/sclera: Conjunctivae normal.  Cardiovascular:     Rate and Rhythm: Normal rate and regular rhythm.     Heart sounds: No murmur heard. Pulmonary:     Effort: Pulmonary effort is normal. No respiratory distress.     Breath sounds: Normal breath sounds.  Abdominal:     General: There is no distension.     Palpations: Abdomen is soft.     Tenderness: There is no abdominal tenderness. There is no right CVA tenderness or left CVA tenderness.  Musculoskeletal:        General: No swelling or tenderness. Normal range of motion.     Cervical back: Neck supple.  Skin:    General: Skin is warm and dry.  Neurological:     General: No focal deficit present.     Mental Status: She is alert and oriented to person, place, and time. Mental status is at baseline.     Cranial Nerves: No cranial nerve deficit.     Sensory: No sensory deficit.     Motor: No weakness.      ED Course/ Medical Decision Making/ A&P Clinical Course as of 10/19/22 1534  Wed Oct 19, 2022  1040 Diverticulitis,  Consult surgery due to potential perforation.  [CC]    Clinical Course User Index [CC] Tretha Sciara, MD    Procedures Procedures   Medications  Ordered in ED Medications  morphine (PF) 4 MG/ML injection 1 mg (1 mg Intravenous Given 10/19/22 1401)  amLODipine (NORVASC) tablet 5 mg (5 mg Oral Given 10/19/22 1355)  traZODone (DESYREL) tablet 50 mg (has no administration in time range)  zolpidem (AMBIEN) tablet 5 mg (has no administration in time range)  famotidine (PEPCID) tablet 20 mg (20 mg Oral Given 10/19/22 1355)  gabapentin (NEURONTIN) capsule 300 mg (has no administration in time range)   stroke: early stages of recovery book (has no administration in time range)  aspirin suppository 300 mg ( Rectal See Alternative 10/19/22 1355)    Or  aspirin tablet 325 mg (325 mg Oral Given 10/19/22 1355)  enoxaparin (LOVENOX) injection 40 mg (has no  administration in time range)  lactated ringers infusion ( Intravenous New Bag/Given 10/19/22 1355)  acetaminophen (TYLENOL) tablet 650 mg (has no administration in time range)    Or  acetaminophen (TYLENOL) suppository 650 mg (has no administration in time range)  oxyCODONE (Oxy IR/ROXICODONE) immediate release tablet 5 mg (has no administration in time range)  bisacodyl (DULCOLAX) EC tablet 5 mg (has no administration in time range)  ondansetron (ZOFRAN) tablet 4 mg ( Oral See Alternative 10/19/22 1400)    Or  ondansetron (ZOFRAN) injection 4 mg (4 mg Intravenous Given 10/19/22 1400)  hydrALAZINE (APRESOLINE) injection 10 mg (has no administration in time range)  hydrALAZINE (APRESOLINE) tablet 25 mg (25 mg Oral Given 10/19/22 1505)  ciprofloxacin (CIPRO) IVPB 400 mg (has no administration in time range)  metroNIDAZOLE (FLAGYL) IVPB 500 mg (has no administration in time range)  atorvastatin (LIPITOR) tablet 80 mg (has no administration in time range)  perflutren lipid microspheres (DEFINITY) IV suspension (5 mLs Intravenous Given 10/19/22 1505)  lactated ringers bolus 1,000 mL (0 mLs Intravenous Stopped 10/19/22 1000)  iohexol (OMNIPAQUE) 300 MG/ML solution 100 mL (100 mLs Intravenous Contrast Given 10/19/22 1012)  ciprofloxacin (CIPRO) IVPB 400 mg (400 mg Intravenous New Bag/Given 10/19/22 1340)  metroNIDAZOLE (FLAGYL) IVPB 500 mg (0 mg Intravenous Stopped 10/19/22 1338)  iohexol (OMNIPAQUE) 350 MG/ML injection 60 mL (60 mLs Intravenous Contrast Given 10/19/22 1325)    Medical Decision Making:    Kayla Terry is a 76 y.o. female who presented to the ED today with multiple complaints detailed above.     Patient's presentation is complicated by their history of multiple comorbid medical problems, advanced age.  Patient placed on continuous vitals and telemetry monitoring while in ED which was reviewed periodically.   Complete initial physical exam performed, notably the patient  was  hemodynamically stable no acute distress.  She has normal sensation diffusely equal strength bilaterally.      Reviewed and confirmed nursing documentation for past medical history, family history, social history.    Initial Assessment:   With the patient's presentation of abdominal pain, most likely diagnosis is nonspecific etiology versus urinary tract infection. Other diagnoses were considered including (but not limited to) metabolic disruption, anemia, appendicitis, cholecystitis, small bowel obstruction, pancreatitis. These are considered less likely due to history of present illness and physical exam findings.   This is most consistent with an acute life/limb threatening illness complicated by underlying chronic conditions.  Additionally, patient's description of intermittent tingling in diffuse locations seems less consistent with a CVA.  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   Initial Plan:  CTH to evaluate for structural intracranial etiology of patient's intermittent numbness over the last 5 days CT pelvis with contrast to  evaluate for structural intra-abdominal etiology causing her abdominal pain and discomfort Viral screening for evaluation for potential etiology of her diffuse syndrome Lipase to evaluate for pancreatitis Screening labs including CBC and Metabolic panel to evaluate for infectious or metabolic etiology of disease.  Urinalysis with reflex culture ordered to evaluate for UTI or relevant urologic/nephrologic pathology.  CXR to evaluate for structural/infectious intrathoracic pathology.  EKG to evaluate for cardiac pathology. Objective evaluation as below reviewed with plan for close reassessment  Initial Study Results:   Laboratory  All laboratory results reviewed without evidence of clinically relevant pathology.     EKG EKG was reviewed independently. Rate, rhythm, axis, intervals all examined and without  medically relevant abnormality. ST segments without concerns for elevations.    Radiology  All images reviewed independently. Agree with radiology report at this time.   CT ANGIO HEAD NECK W WO CM  Result Date: 10/19/2022 CLINICAL DATA:  Acute neuro deficit.  Stroke. EXAM: CT ANGIOGRAPHY HEAD AND NECK TECHNIQUE: Multidetector CT imaging of the head and neck was performed using the standard protocol during bolus administration of intravenous contrast. Multiplanar CT image reconstructions and MIPs were obtained to evaluate the vascular anatomy. Carotid stenosis measurements (when applicable) are obtained utilizing NASCET criteria, using the distal internal carotid diameter as the denominator. RADIATION DOSE REDUCTION: This exam was performed according to the departmental dose-optimization program which includes automated exposure control, adjustment of the mA and/or kV according to patient size and/or use of iterative reconstruction technique. CONTRAST:  71mL OMNIPAQUE IOHEXOL 350 MG/ML SOLN COMPARISON:  MRI head and CT head 10/19/2022 FINDINGS: CTA NECK FINDINGS Aortic arch: Standard branching. Imaged portion shows no evidence of aneurysm or dissection. No significant stenosis of the major arch vessel origins. Right carotid system: Right carotid widely patent. Minimal atherosclerotic calcification right carotid bifurcation Left carotid system: Left carotid widely patent. Mild atherosclerotic calcification proximal left internal carotid artery. Vertebral arteries: Both vertebral arteries patent to the skull base without significant stenosis. Skeleton: Cervical spondylosis especially at C4-5, C5-6, C6-7 causing spinal and foraminal stenosis. No acute skeletal abnormality. Other neck: Negative for mass or adenopathy Upper chest: Lung apices clear bilaterally. Review of the MIP images confirms the above findings CTA HEAD FINDINGS Anterior circulation: Internal carotid artery widely patent to the skull base and  cavernous segments. Anterior and middle cerebral arteries patent bilaterally without large vessel occlusion. Mild atherosclerotic irregularity in the middle cerebral artery branches bilaterally. Mild atherosclerotic irregularity in the M1 segment bilaterally. Posterior circulation: Both vertebral arteries patent to the basilar. PICA patent bilaterally. Basilar widely patent. Superior cerebellar and posterior cerebral arteries patent bilaterally. Fetal origin right posterior cerebral artery. Mild atherosclerotic irregularity in the P2 and P3 segments bilaterally. Venous sinuses: Normal venous enhancement Anatomic variants: None Review of the MIP images confirms the above findings IMPRESSION: 1. Negative for intracranial large vessel occlusion. 2. Mild atherosclerotic irregularity in the middle cerebral arteries bilaterally. Mild atherosclerotic irregularity in the posterior cerebral artery bilaterally. 3. No significant carotid or vertebral artery stenosis in the neck. Electronically Signed   By: Marlan Palau M.D.   On: 10/19/2022 13:54   MR BRAIN WO CONTRAST  Result Date: 10/19/2022 CLINICAL DATA:  Neuro deficit, acute, stroke suspected EXAM: MRI HEAD WITHOUT CONTRAST MRA HEAD WITHOUT CONTRAST TECHNIQUE: Multiplanar, multi-echo pulse sequences of the brain and surrounding structures were acquired without intravenous contrast. Angiographic images of the Circle of Willis were acquired using MRA technique without intravenous contrast. COMPARISON:  None Available. FINDINGS: MRI HEAD FINDINGS  Brain: Small acute left thalamocapsular infarct. Mild associated edema without significant mass effect. Midline shift. No evidence of acute hemorrhage, mass lesion, midline shift or hydrocephalus. Vascular: See below. Skull and upper cervical spine: Normal marrow signal. Sinuses/Orbits: Clear sinuses.  No acute orbital findings. Other: No mastoid effusions. MRA HEAD FINDINGS Anterior circulation: Bilateral intracranial ICAs,  MCAs, and ACAs are patent without proximal high-grade stenosis. Posterior circulation: Bilateral intradural vertebral arteries, basilar artery and bilateral posterior cerebral arteries are patent. At least moderate stenosis of the right P2 PCA appear IMPRESSION: 1. Small acute left thalamocapsular infarct. 2. No emergent large vessel occlusion. 3. At least moderate right P2 PCA stenosis. Electronically Signed   By: Margaretha Sheffield M.D.   On: 10/19/2022 13:11   MR ANGIO HEAD WO CONTRAST  Result Date: 10/19/2022 CLINICAL DATA:  Neuro deficit, acute, stroke suspected EXAM: MRI HEAD WITHOUT CONTRAST MRA HEAD WITHOUT CONTRAST TECHNIQUE: Multiplanar, multi-echo pulse sequences of the brain and surrounding structures were acquired without intravenous contrast. Angiographic images of the Circle of Willis were acquired using MRA technique without intravenous contrast. COMPARISON:  None Available. FINDINGS: MRI HEAD FINDINGS Brain: Small acute left thalamocapsular infarct. Mild associated edema without significant mass effect. Midline shift. No evidence of acute hemorrhage, mass lesion, midline shift or hydrocephalus. Vascular: See below. Skull and upper cervical spine: Normal marrow signal. Sinuses/Orbits: Clear sinuses.  No acute orbital findings. Other: No mastoid effusions. MRA HEAD FINDINGS Anterior circulation: Bilateral intracranial ICAs, MCAs, and ACAs are patent without proximal high-grade stenosis. Posterior circulation: Bilateral intradural vertebral arteries, basilar artery and bilateral posterior cerebral arteries are patent. At least moderate stenosis of the right P2 PCA appear IMPRESSION: 1. Small acute left thalamocapsular infarct. 2. No emergent large vessel occlusion. 3. At least moderate right P2 PCA stenosis. Electronically Signed   By: Margaretha Sheffield M.D.   On: 10/19/2022 13:11   CT ABDOMEN PELVIS W CONTRAST  Result Date: 10/19/2022 CLINICAL DATA:  Acute, nonlocalized abdominal pain EXAM:  CT ABDOMEN AND PELVIS WITH CONTRAST TECHNIQUE: Multidetector CT imaging of the abdomen and pelvis was performed using the standard protocol following bolus administration of intravenous contrast. RADIATION DOSE REDUCTION: This exam was performed according to the departmental dose-optimization program which includes automated exposure control, adjustment of the mA and/or kV according to patient size and/or use of iterative reconstruction technique. CONTRAST:  131mL OMNIPAQUE IOHEXOL 300 MG/ML  SOLN COMPARISON:  08/02/2022 FINDINGS: Lower chest:  Pectus excavatum deforming the right heart. Hepatobiliary: No focal liver abnormality.No evidence of biliary obstruction or stone. Pancreas: Unremarkable. Spleen: Unremarkable. Adrenals/Urinary Tract: Negative adrenals. No hydronephrosis or stone. Tiny renal cystic densities. No indication of pyelonephritis. Unremarkable bladder. Stomach/Bowel: Submucosal low-density thickening involving the sigmoid colon around a thick-walled gaseous collection measuring 9 mm along the inferior and posterior wall of the affected segment. Colonic diverticulosis is extensive. Trace pelvic fluid. No bowel obstruction Vascular/Lymphatic: Scattered atheromatous calcification of the aorta and visceral origins. No mass or adenopathy. Reproductive:Hysterectomy Other: Trace pelvic fluid.  No pneumoperitoneum. Musculoskeletal: Lumbar spine degeneration with notable facet spurring and lumbar lordosis. IMPRESSION: Sigmoid diverticulitis with 9 mm gaseous collection that could be in or adjacent to the offending diverticulum. Electronically Signed   By: Jorje Guild M.D.   On: 10/19/2022 10:35   CT HEAD WO CONTRAST (5MM)  Result Date: 10/19/2022 CLINICAL DATA:  Status change. Right hand arm and numbness 4 days ago. EXAM: CT HEAD WITHOUT CONTRAST TECHNIQUE: Contiguous axial images were obtained from the base of the skull through the  vertex without intravenous contrast. RADIATION DOSE REDUCTION:  This exam was performed according to the departmental dose-optimization program which includes automated exposure control, adjustment of the mA and/or kV according to patient size and/or use of iterative reconstruction technique. COMPARISON:  CT head 08/01/2022 FINDINGS: Brain: 1 cm hypodensity left thalamus is new since the prior study compatible with infarct which may be recent. Generalized atrophy. Mild patchy white matter hypodensity bilaterally compatible with chronic microvascular ischemia. Negative for acute hemorrhage or mass Vascular: Negative for hyperdense vessel Skull: Negative Sinuses/Orbits: Paranasal sinuses clear. Bilateral cataract extraction Other: None IMPRESSION: 1. 1 cm hypodensity left thalamus compatible with infarct which may be recent. No acute hemorrhage. 2. Atrophy and chronic microvascular ischemia. Electronically Signed   By: Franchot Gallo M.D.   On: 10/19/2022 10:34      Final Assessment and Plan:   Case discussed with Dr. Arnoldo Morale of general surgery who evaluated at bedside.  He agrees nonoperative at this time.  Will treat medically and arrange for admission.  CT head also demonstrates CVA of uncertain timeline.  Patient's clinical history is 5 days of right hand numbness.    Disposition:   Based on the above findings, I believe this patient is stable for admission.    Patient/family educated about specific findings on our evaluation and explained exact reasons for admission.  Patient/family educated about clinical situation and time was allowed to answer questions.   Admission team communicated with and agreed with need for admission. Patient admitted. Patient ready to move at this time.     Emergency Department Medication Summary:   Medications  morphine (PF) 4 MG/ML injection 1 mg (1 mg Intravenous Given 10/19/22 1401)  amLODipine (NORVASC) tablet 5 mg (5 mg Oral Given 10/19/22 1355)  traZODone (DESYREL) tablet 50 mg (has no administration in time range)   zolpidem (AMBIEN) tablet 5 mg (has no administration in time range)  famotidine (PEPCID) tablet 20 mg (20 mg Oral Given 10/19/22 1355)  gabapentin (NEURONTIN) capsule 300 mg (has no administration in time range)   stroke: early stages of recovery book (has no administration in time range)  aspirin suppository 300 mg ( Rectal See Alternative 10/19/22 1355)    Or  aspirin tablet 325 mg (325 mg Oral Given 10/19/22 1355)  enoxaparin (LOVENOX) injection 40 mg (has no administration in time range)  lactated ringers infusion ( Intravenous New Bag/Given 10/19/22 1355)  acetaminophen (TYLENOL) tablet 650 mg (has no administration in time range)    Or  acetaminophen (TYLENOL) suppository 650 mg (has no administration in time range)  oxyCODONE (Oxy IR/ROXICODONE) immediate release tablet 5 mg (has no administration in time range)  bisacodyl (DULCOLAX) EC tablet 5 mg (has no administration in time range)  ondansetron (ZOFRAN) tablet 4 mg ( Oral See Alternative 10/19/22 1400)    Or  ondansetron (ZOFRAN) injection 4 mg (4 mg Intravenous Given 10/19/22 1400)  hydrALAZINE (APRESOLINE) injection 10 mg (has no administration in time range)  hydrALAZINE (APRESOLINE) tablet 25 mg (25 mg Oral Given 10/19/22 1505)  ciprofloxacin (CIPRO) IVPB 400 mg (has no administration in time range)  metroNIDAZOLE (FLAGYL) IVPB 500 mg (has no administration in time range)  atorvastatin (LIPITOR) tablet 80 mg (has no administration in time range)  perflutren lipid microspheres (DEFINITY) IV suspension (5 mLs Intravenous Given 10/19/22 1505)  lactated ringers bolus 1,000 mL (0 mLs Intravenous Stopped 10/19/22 1000)  iohexol (OMNIPAQUE) 300 MG/ML solution 100 mL (100 mLs Intravenous Contrast Given 10/19/22 1012)  ciprofloxacin (CIPRO) IVPB  400 mg (400 mg Intravenous New Bag/Given 10/19/22 1340)  metroNIDAZOLE (FLAGYL) IVPB 500 mg (0 mg Intravenous Stopped 10/19/22 1338)  iohexol (OMNIPAQUE) 350 MG/ML injection 60 mL (60 mLs  Intravenous Contrast Given 10/19/22 1325)         Clinical Impression:  1. Diverticulitis      Admit   Final Clinical Impression(s) / ED Diagnoses Final diagnoses:  Diverticulitis    Rx / DC Orders ED Discharge Orders     None         Tretha Sciara, MD 10/19/22 1535

## 2022-10-19 NOTE — ED Triage Notes (Signed)
Pt c/o right arm and hand numbness that started on Saturday; pt states she was unable to hold onto anything due to dropping items  Pt states she started having numbness to left hand and left upper thigh yesterday  Pt also c/o abdominal cramping and states the cramping is worse when she urinates

## 2022-10-19 NOTE — H&P (Signed)
History and Physical  [facility]  Kayla Terry SWN:[id number] DOB: May 19, 1946 DOA: 10/19/2022  PCP: Christain Sacramento, MD  Patient coming from: Home  Level of care: Telemetry  I have personally briefly reviewed patient's old medical records in [place]  Chief Complaint: right arm/hand numbness  HPI: Kayla Terry is a 76 year old female with history of GERD, hypertension, hyperlipidemia, Mobitz type II second-degree AV block, hiatal hernia, fatty liver, coronary artery disease, deficiency and seasonal allergies presented to the ED complaining of right arm and hand numbness that started approximately 4 to 5 days ago.  She reports that she initially was unable to hold onto things or grabs things and was dropping objects frequently.  She also started noticing numbness to the left hand and left upper thigh.  She did not seek medical care at that time.  She presents today complaining of abdominal cramping in the left upper quadrant area.  She reports that the pain seems to be worse with urination.  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  It was noted to be 9 mm in size.  She was seen by Dr. Arnoldo Morale with general surgery who recommended conservative management with IV antibiotics and bowel rest.  Will also need to undergo a colonoscopy outpatient when medically stabilized.  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  She is being admitted for further management, IV antibiotics and workup for possible acute CVA.    Past Medical History:  Diagnosis Date   Allergy    B12 deficiency 04/2020   Cataract    Coronary artery disease    COVID-19 04/22/2021   Fatty liver    GERD (gastroesophageal reflux disease)    Herpes zoster without complications    Hiatal hernia    Hypertension    Mild mitral regurgitation 2013   Mobitz type 2 second  degree atrioventricular block    Pectus excavatum    pectus excavatum deformity resulting in mass effect on the RV by CT 03/2020   Pulmonary nodule    Shingles     Past Surgical History:  Procedure Laterality Date   ABDOMINAL HYSTERECTOMY  02/1984   BACK SURGERY     CATARACT EXTRACTION, BILATERAL     LEFT HEART CATH AND CORONARY ANGIOGRAPHY N/A 04/22/2021   Procedure: LEFT HEART CATH AND CORONARY ANGIOGRAPHY;  Surgeon: Burnell Blanks, MD;  Location: [facility];  Service: Cardiovascular;  Laterality: N/A;   LUMBAR LAMINECTOMY     OOPHORECTOMY  02/29/1984   unilateral     reports that she has never smoked. She has never used smokeless tobacco. She reports that she does not drink alcohol and does not use drugs.  Allergies  Allergen Reactions   Escitalopram Nausea Only and Other (See Comments)    Nausea and dizziness   Pantoprazole Other (See Comments)    Severe headaches    Naproxen Sodium Anxiety    Family History  Problem Relation Age of Onset   Cancer Mother    Leukemia Mother    Lung cancer Father    Multiple myeloma Father    Multiple sclerosis Sister    Alcohol abuse Brother    Multiple myeloma Brother    Colon cancer Neg Hx    Colon polyps Neg Hx    Stomach cancer Neg Hx    Esophageal cancer Neg  Hx    Neuropathy Neg Hx     Prior to Admission medications   Medication Sig Start Date End Date Taking? Authorizing Provider  amLODipine (NORVASC) 5 MG tablet Take 1 tablet (5 mg total) by mouth daily. Patient not taking: Reported on 10/19/2022 08/03/22 10/02/22  Heath Lark D, DO  aspirin-acetaminophen-caffeine (EXCEDRIN MIGRAINE) [phone] MG tablet Take 2 tablets by mouth every 6 (six) hours as needed for headache.    [provider]  famotidine (PEPCID) 20 MG tablet Take 20 mg by mouth daily.    [provider]  gabapentin (NEURONTIN) 300 MG capsule Take 300 mg by mouth 3 (three) times daily as needed. 07/18/22   [provider]  LORazepam (ATIVAN) 0.5 MG tablet Take 0.5 mg by mouth 2 (two) times daily as needed for anxiety.  Patient not taking: Reported on 08/01/2022    [provider]  Polyethyl Glycol-Propyl Glycol (SYSTANE OP) Place 1 drop into both eyes 2 (two) times daily as needed (itching).    [provider]  traZODone (DESYREL) 100 MG tablet Take 50 mg by mouth at bedtime. 03/02/22   [provider]  zolpidem (AMBIEN) 10 MG tablet Take 10 mg by mouth every evening.    [provider]    Physical Exam: Vitals:   10/19/22 1058 10/19/22 1100 10/19/22 1130 10/19/22 1200  BP: (!) 159/93 (!) 176/91 (!) 174/92 (!) 176/90  Pulse: 87 94 79 87  Resp: 18     Temp: 98.5 F (36.9 C)     TempSrc: Oral     SpO2: 97% 95% 96% 95%  Weight:      Height:        Constitutional: NAD, calm, comfortable Eyes: PERRL, lids and conjunctivae normal ENMT: Mucous membranes are moist. Posterior pharynx clear of any exudate or lesions.Normal dentition.  Neck: normal, supple, no masses, no thyromegaly Respiratory: clear to auscultation bilaterally, no wheezing, no crackles. Normal respiratory effort. No accessory muscle use.  Cardiovascular: normal s1, s2 sounds, no murmurs / rubs / gallops. No extremity edema. 2+ pedal pulses. No carotid bruits.  Abdomen: LLQ and mild suprapubic tenderness, no masses palpated. No hepatosplenomegaly. Bowel sounds positive.  Musculoskeletal: no clubbing / cyanosis. No joint deformity upper and lower extremities. Good ROM, no contractures. Normal muscle tone.  Skin: no rashes, lesions, ulcers. No induration Neurologic: CN 2-12 grossly intact. Sensation intact, DTR normal. Strength 5/5 in all 4.  Psychiatric: UTD judgment and insight. Alert and oriented x 3. Normal mood.   Labs on Admission: I have personally reviewed following labs and imaging studies  CBC: Recent Labs  Lab 10/19/22 0739  WBC 13.3*  NEUTROABS 11.3*  HGB 14.2  HCT 43.4  MCV  89.7  PLT 263   Basic Metabolic Panel: Recent Labs  Lab 10/19/22 0739  NA 138  K 3.6  CL 100  CO2 27  GLUCOSE 118*  BUN 8  CREATININE 0.93  CALCIUM 9.7   GFR: Estimated Creatinine Clearance: 42.6 mL/min (by C-G formula based on SCr of 0.93 mg/dL). Liver Function Tests: Recent Labs  Lab 10/19/22 0739  AST 21  ALT 15  ALKPHOS 96  BILITOT 0.7  PROT 7.8  ALBUMIN 4.2   Recent Labs  Lab 10/19/22 0739  LIPASE 29   No results for input(s): "AMMONIA" in the last 168 hours. Coagulation Profile: No results for input(s): "INR", "PROTIME" in the last 168 hours. Cardiac Enzymes: No results for input(s): "CKTOTAL", "CKMB", "CKMBINDEX", "TROPONINI" in the last 168  hours. BNP (last 3 results) No results for input(s): "PROBNP" in the last 8760 hours. HbA1C: No results for input(s): "HGBA1C" in the last 72 hours. CBG: No results for input(s): "GLUCAP" in the last 168 hours. Lipid Profile: No results for input(s): "CHOL", "HDL", "LDLCALC", "TRIG", "CHOLHDL", "LDLDIRECT" in the last 72 hours. Thyroid Function Tests: No results for input(s): "TSH", "T4TOTAL", "FREET4", "T3FREE", "THYROIDAB" in the last 72 hours. Anemia Panel: No results for input(s): "VITAMINB12", "FOLATE", "FERRITIN", "TIBC", "IRON", "RETICCTPCT" in the last 72 hours. Urine analysis:    Component Value Date/Time   COLORURINE STRAW (A) 10/19/2022 0827   APPEARANCEUR CLEAR 10/19/2022 0827   LABSPEC 1.002 (L) 10/19/2022 0827   PHURINE 6.0 10/19/2022 0827   GLUCOSEU NEGATIVE 10/19/2022 0827   HGBUR NEGATIVE 10/19/2022 0827   BILIRUBINUR NEGATIVE 10/19/2022 0827   KETONESUR NEGATIVE 10/19/2022 0827   PROTEINUR NEGATIVE 10/19/2022 0827   UROBILINOGEN 0.2 01/29/2012 0938   NITRITE NEGATIVE 10/19/2022 0827   LEUKOCYTESUR NEGATIVE 10/19/2022 0827    Radiological Exams on Admission: CT ABDOMEN PELVIS W CONTRAST  Result Date: 10/19/2022 CLINICAL DATA:  Acute, nonlocalized abdominal pain EXAM: CT ABDOMEN AND  PELVIS WITH CONTRAST TECHNIQUE: Multidetector CT imaging of the abdomen and pelvis was performed using the standard protocol following bolus administration of intravenous contrast. RADIATION DOSE REDUCTION: This exam was performed according to the departmental dose-optimization program which includes automated exposure control, adjustment of the mA and/or kV according to patient size and/or use of iterative reconstruction technique. CONTRAST:  131m OMNIPAQUE IOHEXOL 300 MG/ML  SOLN COMPARISON:  08/02/2022 FINDINGS: Lower chest:  Pectus excavatum deforming the right heart. Hepatobiliary: No focal liver abnormality.No evidence of biliary obstruction or stone. Pancreas: Unremarkable. Spleen: Unremarkable. Adrenals/Urinary Tract: Negative adrenals. No hydronephrosis or stone. Tiny renal cystic densities. No indication of pyelonephritis. Unremarkable bladder. Stomach/Bowel: Submucosal low-density thickening involving the sigmoid colon around a thick-walled gaseous collection measuring 9 mm along the inferior and posterior wall of the affected segment. Colonic diverticulosis is extensive. Trace pelvic fluid. No bowel obstruction Vascular/Lymphatic: Scattered atheromatous calcification of the aorta and visceral origins. No mass or adenopathy. Reproductive:Hysterectomy Other: Trace pelvic fluid.  No pneumoperitoneum. Musculoskeletal: Lumbar spine degeneration with notable facet spurring and lumbar lordosis. IMPRESSION: Sigmoid diverticulitis with 9 mm gaseous collection that could be in or adjacent to the offending diverticulum. Electronically Signed   By: JJorje GuildM.D.   On: 10/19/2022 10:35   CT HEAD WO CONTRAST (5MM)  Result Date: 10/19/2022 CLINICAL DATA:  Status change. Right hand arm and numbness 4 days ago. EXAM: CT HEAD WITHOUT CONTRAST TECHNIQUE: Contiguous axial images were obtained from the base of the skull through the vertex without intravenous contrast. RADIATION DOSE REDUCTION: This exam was  performed according to the departmental dose-optimization program which includes automated exposure control, adjustment of the mA and/or kV according to patient size and/or use of iterative reconstruction technique. COMPARISON:  CT head 08/01/2022 FINDINGS: Brain: 1 cm hypodensity left thalamus is new since the prior study compatible with infarct which may be recent. Generalized atrophy. Mild patchy white matter hypodensity bilaterally compatible with chronic microvascular ischemia. Negative for acute hemorrhage or mass Vascular: Negative for hyperdense vessel Skull: Negative Sinuses/Orbits: Paranasal sinuses clear. Bilateral cataract extraction Other: None IMPRESSION: 1. 1 cm hypodensity left thalamus compatible with infarct which may be recent. No acute hemorrhage. 2. Atrophy and chronic microvascular ischemia. Electronically Signed   By: CFranchot GalloM.D.   On: 10/19/2022 10:34    EKG: Independently reviewed.   Assessment/Plan  Principal Problem:   Sigmoid diverticulitis Active Problems:   Acid reflux disease   Benign essential hypertension   Hyperlipidemia   Restless leg syndrome   Muscle weakness   B12 deficiency   Cervical disc disorder with myelopathy   2nd degree AV block   Coronary artery disease   Leukocytosis    Acute sigmoid diverticulitis -Contained perforation -Discussed with Dr. Arnoldo Morale treating conservatively with IV antibiotics and bowel rest -IV pain management, Cipro and Flagyl twice daily -Clear liquid diet (2 g sodium restricted) -Follow clinical improvement -Advancing diet as tolerated  Leukocytosis -Secondary to intra-abdominal infection -Follow CBC with differential  Concern for acute CVA -Symptoms started 4 to 5 days ago which is out of window for TPA treatment -MRI brain without contrast, CTA head and neck -Check TTE -Aspirin for antiplatelet therapy -Working to normalize blood pressure as it was likely 4-5 days since acute event -PT/OT/SLP  evaluation -Check lipid panel and A1c -Gentle IV fluid hydration -Neurochecks -If stroke positive will request inpatient neurology consultation -Further recommendations to follow  Essential hypertension, poorly controlled -Patient reportedly is only been taking amlodipine intermittently -Restarted amlodipine 5 mg daily, monitor BP response -Added hydralazine 25 mg 3 times daily, IV ordered for SBP greater than 160 -Working to normalize blood pressure now that we are 5 days out since acute event  Hyperlipidemia -Follow-up lipid panel and treat with statin therapy  CAD -Stable no chest pain symptoms -Follow-up lipid panel and A1c  DVT prophylaxis: Enoxaparin Code Status: Full Family Communication: Bedside update Disposition Plan: TBD Consults called: Surgery Admission status: Inpatient Level of care: Telemetry Irwin Brakeman MD [facility] How to contact the [facility] Attending or Consulting provider 7A - 7P or covering provider during after hours [place], for this patient?  Check the care team in [facility] and look for a) attending/consulting TRH provider listed and b) the [facility] team listed Log into [email or web address] and use [place]'s universal password to access. If you do not have the password, please contact the hospital operator. Locate the [facility] provider you are looking for under [facility] and page to a number that you can be directly reached. If you still have difficulty reaching the provider, please page the [facility] (Director on Call) for the Hospitalists listed on amion for assistance.   If 7PM-7AM, please contact night-coverage [email or web address] Password [facility]  10/19/2022, 12:43 PM

## 2022-10-20 DIAGNOSIS — K5720 Diverticulitis of large intestine with perforation and abscess without bleeding: Secondary | ICD-10-CM | POA: Diagnosis not present

## 2022-10-20 LAB — COMPREHENSIVE METABOLIC PANEL
ALT: 11 U/L (ref 0–44)
AST: 13 U/L — ABNORMAL LOW (ref 15–41)
Albumin: 3 g/dL — ABNORMAL LOW (ref 3.5–5.0)
Alkaline Phosphatase: 68 U/L (ref 38–126)
Anion gap: 8 (ref 5–15)
BUN: 8 mg/dL (ref 8–23)
CO2: 28 mmol/L (ref 22–32)
Calcium: 8.6 mg/dL — ABNORMAL LOW (ref 8.9–10.3)
Chloride: 102 mmol/L (ref 98–111)
Creatinine, Ser: 0.84 mg/dL (ref 0.44–1.00)
GFR, Estimated: >60 mL/min (ref >60)
Glucose, Bld: 104 mg/dL — ABNORMAL HIGH (ref 70–99)
Potassium: 3.3 mmol/L — ABNORMAL LOW (ref 3.5–5.1)
Sodium: 138 mmol/L (ref 135–145)
Total Bilirubin: 0.7 mg/dL (ref 0.3–1.2)
Total Protein: 5.8 g/dL — ABNORMAL LOW (ref 6.5–8.1)

## 2022-10-20 LAB — HEMOGLOBIN A1C
Hgb A1c MFr Bld: 5.6 % (ref 4.8–5.6)
Mean Plasma Glucose: 114 mg/dL

## 2022-10-20 LAB — MAGNESIUM: Magnesium: 1.8 mg/dL (ref 1.7–2.4)

## 2022-10-20 MED ORDER — ASPIRIN 81 MG PO TBEC
81.0000 mg | DELAYED_RELEASE_TABLET | Freq: Every day | ORAL | Status: DC
  Administered 2022-10-21: 81 mg via ORAL
  Filled 2022-10-20: qty 1

## 2022-10-20 MED ORDER — POTASSIUM CHLORIDE CRYS ER 20 MEQ PO TBCR
40.0000 meq | EXTENDED_RELEASE_TABLET | Freq: Once | ORAL | Status: AC
  Administered 2022-10-20: 40 meq via ORAL
  Filled 2022-10-20: qty 2

## 2022-10-20 MED ORDER — ATORVASTATIN CALCIUM 40 MG PO TABS
40.0000 mg | ORAL_TABLET | Freq: Every evening | ORAL | Status: DC
  Administered 2022-10-20: 40 mg via ORAL
  Filled 2022-10-20: qty 1

## 2022-10-20 MED ORDER — HYDROCODONE-ACETAMINOPHEN 5-325 MG PO TABS
1.0000 | ORAL_TABLET | ORAL | Status: DC | PRN
  Administered 2022-10-20 – 2022-10-21 (×4): 1 via ORAL
  Filled 2022-10-20 (×4): qty 1

## 2022-10-20 MED ORDER — MAGNESIUM SULFATE 2 GM/50ML IV SOLN
2.0000 g | Freq: Once | INTRAVENOUS | Status: AC
  Administered 2022-10-20: 2 g via INTRAVENOUS
  Filled 2022-10-20: qty 50

## 2022-10-20 MED ORDER — POLYVINYL ALCOHOL 1.4 % OP SOLN: 1.0000 [drp] | Freq: Every day | OPHTHALMIC | Status: DC | PRN

## 2022-10-20 MED ORDER — LOSARTAN POTASSIUM 50 MG PO TABS
25.0000 mg | ORAL_TABLET | Freq: Every day | ORAL | Status: DC
  Administered 2022-10-21: 25 mg via ORAL
  Filled 2022-10-20: qty 1

## 2022-10-20 MED ORDER — CLOPIDOGREL BISULFATE 75 MG PO TABS
75.0000 mg | ORAL_TABLET | Freq: Every day | ORAL | Status: DC
  Administered 2022-10-20 – 2022-10-21 (×2): 75 mg via ORAL
  Filled 2022-10-20 (×2): qty 1

## 2022-10-20 NOTE — TOC Initial Note (Signed)
Transition of Care [facility]) - Initial/Assessment Note    Patient Details  Name: Kayla Terry MRN: [id number] Date of Birth: 18-Jan-1946  [facility]) CM/SW Contact:    Karn Cassis, LCSW Phone Number: 10/20/2022, 12:05 PM  Clinical Narrative:  Assessment completed with pt. She reports she lives alone. PT evaluated pt and recommend home health. Discussed with pt who is agreeable and no preference on agency. Referred and accepted by Kandee Keen with [facility] for HHPT. Will need order.                   Planned Disposition: Home with Health Care Svc Barriers to Discharge: Continued Medical Work up   Patient Goals and CMS Choice Patient states their goals for this hospitalization and ongoing recovery are:: return home   Choice offered to / list presented to : Patient [place] ownership interest in [facility].provided to::  (n/a)    Expected Discharge Plan and Services Planned Disposition: Home with Health Care Svc In-house Referral: Clinical Social Work     Living arrangements for the past 2 months: Single Family Home                           HH Arranged: PT HH Agency: [facility] Date [facility] Agency Contacted: 10/20/22 Time HH Agency Contacted: [id number] Representative spoke with at [facility] Agency: Kandee Keen  Prior Living Arrangements/Services Living arrangements for the past 2 months: Single Family Home Lives with:: Self Patient language and need for interpreter reviewed:: Yes Do you feel safe going back to the place where you live?: Yes      Need for Family Participation in Patient Care: No (Comment)   Current home services: DME (cane, walker) Criminal Activity/Legal Involvement Pertinent to Current Situation/Hospitalization: No - Comment as needed  Activities of Daily Living Home Assistive Devices/Equipment: None ADL Screening (condition at time of admission) Patient's cognitive ability adequate to safely complete daily activities?: Yes Is  the patient deaf or have difficulty hearing?: No Does the patient have difficulty seeing, even when wearing glasses/contacts?: No Does the patient have difficulty concentrating, remembering, or making decisions?: No Patient able to express need for assistance with ADLs?: Yes Does the patient have difficulty dressing or bathing?: No Independently performs ADLs?: Yes (appropriate for developmental age) Does the patient have difficulty walking or climbing stairs?: Yes Weakness of Legs: Both Weakness of Arms/Hands: None  Permission Sought/Granted                  Emotional Assessment     Affect (typically observed): Appropriate Orientation: : Oriented to Self, Oriented to Place, Oriented to  Time, Oriented to Situation Alcohol / Substance Use: Not Applicable Psych Involvement: No (comment)  Admission diagnosis:  Diverticulitis [K57.92] Sigmoid diverticulitis [K57.32] Patient Active Problem List   Diagnosis Date Noted   Sigmoid diverticulitis 10/19/2022   Leukocytosis 10/19/2022   Abdominal pain 08/02/2022   Hypertensive urgency 08/01/2022   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/06/2022   COVID-19 virus infection 04/23/2021   Coronary artery disease 04/23/2021   Chest pain 04/22/2021   Chest pain of uncertain etiology    Nonspecific abnormal electrocardiogram (ECG) (EKG)    2nd degree AV block 03/25/2021   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   B12 deficiency 05/07/2020   Muscle weakness 05/06/2020   Benign essential hypertension 06/08/2017   Acid reflux disease 03/08/2016   Hyperlipidemia 03/08/2016   Nocturia 03/08/2016  Restless leg syndrome 03/08/2016   Chest tightness 08/21/2012   PCP:  [name], MD Pharmacy:   [id number] - [place], [place] - [address] AT [facility] OF [address] & [address] [facility] [address] Phone: [phone] Fax:  [phone]     Social Determinants of Health (SDOH) Social History: SDOH Screenings   Food Insecurity: No Food Insecurity (10/19/2022)  Housing: Low Risk  (10/19/2022)  Transportation Needs: No Transportation Needs (10/19/2022)  Utilities: Not At Risk (10/19/2022)  Tobacco Use: Low Risk  (10/19/2022)   SDOH Interventions: Housing Interventions: Patient Refused   Readmission Risk Interventions     No data to display

## 2022-10-20 NOTE — Progress Notes (Signed)
SLP Cancellation Note  Patient Details Name: Kayla Terry MRN: [id number] DOB: 07/01/1946   Cancelled treatment:       Reason Eval/Treat Not Completed: SLP screened, no needs identified, will sign off; SLP screened Pt in room. Pt denies any changes in swallowing, speech, language, or cognition. She reports mild right hand weakness. SLE will be deferred at this time. Reconsult if indicated. SLP will sign off.   Thank you,  Havery Moros, CCC-SLP [phone]  [name] 10/20/2022, 4:10 PM

## 2022-10-20 NOTE — Progress Notes (Signed)
Subjective: Patient has crampy lower abdominal pain.  She has been passing gas and had a small bowel movement.  She is more concerned about her right hand numbness.  Objective: Vital signs in last 24 hours: Temp:  [97.4 F (36.3 C)-98.7 F (37.1 C)] 98.1 F (36.7 C) (12/21 0344) Pulse Rate:  [66-94] 73 (12/21 0344) Resp:  [18-20] 18 (12/21 0344) BP: (115-176)/(60-101) 135/79 (12/21 0859) SpO2:  [94 %-99 %] 95 % (12/21 0344) Weight:  [60.6 kg] 60.6 kg (12/20 1540) Last BM Date : 10/18/22  Intake/Output from previous day: 12/20 0701 - 12/21 0700 In: 2356.8 [P.O.:360; I.V.:368.1; IV Piggyback:1628.8] Out: -  Intake/Output this shift: No intake/output data recorded.  General appearance: alert, cooperative, and no distress GI: Soft with minimal discomfort to deep palpation in the lower abdomen.  Bowel sounds present.  Lab Results:  Recent Labs    10/19/22 0739 10/20/22 0409  WBC 13.3* 10.4  HGB 14.2 11.2*  HCT 43.4 34.6*  PLT 274 197   BMET Recent Labs    10/19/22 0739 10/20/22 0409  NA 138 138  K 3.6 3.3*  CL 100 102  CO2 27 28  GLUCOSE 118* 104*  BUN 8 8  CREATININE 0.93 0.84  CALCIUM 9.7 8.6*   PT/INR No results for input(s): "LABPROT", "INR" in the last 72 hours.  Studies/Results: ECHOCARDIOGRAM COMPLETE  Result Date: 10/19/2022    ECHOCARDIOGRAM REPORT   Patient Name:   MIKE BERNTSEN Date of Exam: 10/19/2022 Medical Rec #:  [id number]      Height:       63.0 in Accession #:    [id number]     Weight:       125.0 lb Date of Birth:  July 28, 1946      BSA:          1.584 m Patient Age:    76 years       BP:           176/90 mmHg Patient Gender: F              HR:           68 bpm. [facility] Procedure: 2D Echo, Cardiac Doppler, Color Doppler and Intracardiac            Opacification Agent Indications:    Stroke  History:        Patient has prior history of Echocardiogram examinations, most                 recent 04/22/2021. CAD, Stroke,  Signs/Symptoms:Chest Pain; Risk                 Factors:Hypertension and Dyslipidemia. Technically difficult                 study due to pectus excavatum deformity.  Sonographer:    Mikki Harbor Referring Phys: [phone] CLANFORD L JOHNSON IMPRESSIONS  1. Left ventricular ejection fraction, by estimation, is 55 to 60%. The left ventricle has normal function. The left ventricle has no regional wall motion abnormalities. Left ventricular diastolic parameters are consistent with Grade I diastolic dysfunction (impaired relaxation).  2. Right ventricular systolic function is normal. The right ventricular size is normal. There is normal pulmonary artery systolic pressure.  3. The mitral valve is grossly normal. Mild mitral valve regurgitation. No evidence of mitral stenosis.  4. The aortic valve is tricuspid. There is mild calcification of the aortic valve. Aortic valve regurgitation is not visualized. No aortic  stenosis is present. Comparison(s): No significant change from prior study. FINDINGS  Left Ventricle: Left ventricular ejection fraction, by estimation, is 55 to 60%. The left ventricle has normal function. The left ventricle has no regional wall motion abnormalities. Definity contrast agent was given IV to delineate the left ventricular  endocardial borders. The left ventricular internal cavity size was normal in size. There is no left ventricular hypertrophy. Left ventricular diastolic parameters are consistent with Grade I diastolic dysfunction (impaired relaxation). Right Ventricle: The right ventricular size is normal. Right vetricular wall thickness was not well visualized. Right ventricular systolic function is normal. There is normal pulmonary artery systolic pressure. The tricuspid regurgitant velocity is 2.10 m/s, and with an assumed right atrial pressure of 3 mmHg, the estimated right ventricular systolic pressure is 20.6 mmHg. Left Atrium: Left atrial size was normal in size. Right Atrium: Right atrial  size was normal in size. Pericardium: There is no evidence of pericardial effusion. Mitral Valve: The mitral valve is grossly normal. Mild mitral valve regurgitation. No evidence of mitral valve stenosis. MV peak gradient, 4.2 mmHg. The mean mitral valve gradient is 2.0 mmHg. Tricuspid Valve: The tricuspid valve is not well visualized. Tricuspid valve regurgitation is trivial. No evidence of tricuspid stenosis. Aortic Valve: The aortic valve is tricuspid. There is mild calcification of the aortic valve. Aortic valve regurgitation is not visualized. No aortic stenosis is present. Aortic valve mean gradient measures 4.0 mmHg. Aortic valve peak gradient measures 7.4 mmHg. Aortic valve area, by VTI measures 2.10 cm. Pulmonic Valve: The pulmonic valve was not well visualized. Pulmonic valve regurgitation is not visualized. No evidence of pulmonic stenosis. Aorta: The aortic root and ascending aorta are structurally normal, with no evidence of dilitation. Venous: The inferior vena cava was not well visualized. IAS/Shunts: The interatrial septum was not assessed.  LEFT VENTRICLE PLAX 2D LVIDd:         3.80 cm   Diastology LVIDs:         1.90 cm   LV e' medial:    6.53 cm/s LV PW:         1.10 cm   LV E/e' medial:  9.6 LV IVS:        1.10 cm   LV e' lateral:   10.00 cm/s LVOT diam:     1.80 cm   LV E/e' lateral: 6.3 LV SV:         63 LV SV Index:   40 LVOT Area:     2.54 cm  RIGHT VENTRICLE RV Basal diam:  3.20 cm RV Mid diam:    2.30 cm RV S prime:     10.80 cm/s TAPSE (M-mode): 2.2 cm LEFT ATRIUM             Index        RIGHT ATRIUM           Index LA diam:        3.30 cm 2.08 cm/m   RA Area:     12.90 cm LA Vol (A2C):   41.5 ml 26.21 ml/m  RA Volume:   29.10 ml  18.38 ml/m LA Vol (A4C):   26.0 ml 16.42 ml/m LA Biplane Vol: 32.8 ml 20.71 ml/m  AORTIC VALVE                    PULMONIC VALVE AV Area (Vmax):    2.23 cm     PV Vmax:       0.84 m/s  AV Area (Vmean):   2.27 cm     PV Peak grad:  2.8 mmHg AV Area  (VTI):     2.10 cm AV Vmax:           136.00 cm/s AV Vmean:          87.000 cm/s AV VTI:            0.299 m AV Peak Grad:      7.4 mmHg AV Mean Grad:      4.0 mmHg LVOT Vmax:         119.00 cm/s LVOT Vmean:        77.600 cm/s LVOT VTI:          0.247 m LVOT/AV VTI ratio: 0.83  AORTA Ao Root diam: 3.00 cm Ao Asc diam:  3.20 cm MITRAL VALVE               TRICUSPID VALVE MV Area (PHT): 3.34 cm    TR Peak grad:   17.6 mmHg MV Area VTI:   2.83 cm    TR Vmax:        210.00 cm/s MV Peak grad:  4.2 mmHg MV Mean grad:  2.0 mmHg    SHUNTS MV Vmax:       1.02 m/s    Systemic VTI:  0.25 m MV Vmean:      60.9 cm/s   Systemic Diam: 1.80 cm MV Decel Time: 227 msec MV E velocity: 62.60 cm/s MV A velocity: 95.50 cm/s MV E/A ratio:  0.66 Vishnu Priya Mallipeddi Electronically signed by Winfield RastVishnu Priya Mallipeddi Signature Date/Time: 10/19/2022/4:16:07 PM    Final    CT ANGIO HEAD NECK W WO CM  Result Date: 10/19/2022 CLINICAL DATA:  Acute neuro deficit.  Stroke. EXAM: CT ANGIOGRAPHY HEAD AND NECK TECHNIQUE: Multidetector CT imaging of the head and neck was performed using the standard protocol during bolus administration of intravenous contrast. Multiplanar CT image reconstructions and MIPs were obtained to evaluate the vascular anatomy. Carotid stenosis measurements (when applicable) are obtained utilizing NASCET criteria, using the distal internal carotid diameter as the denominator. RADIATION DOSE REDUCTION: This exam was performed according to the departmental dose-optimization program which includes automated exposure control, adjustment of the mA and/or kV according to patient size and/or use of iterative reconstruction technique. CONTRAST:  60mL OMNIPAQUE IOHEXOL 350 MG/ML SOLN COMPARISON:  MRI head and CT head 10/19/2022 FINDINGS: CTA NECK FINDINGS Aortic arch: Standard branching. Imaged portion shows no evidence of aneurysm or dissection. No significant stenosis of the major arch vessel origins. Right carotid system:  Right carotid widely patent. Minimal atherosclerotic calcification right carotid bifurcation Left carotid system: Left carotid widely patent. Mild atherosclerotic calcification proximal left internal carotid artery. Vertebral arteries: Both vertebral arteries patent to the skull base without significant stenosis. Skeleton: Cervical spondylosis especially at C4-5, C5-6, C6-7 causing spinal and foraminal stenosis. No acute skeletal abnormality. Other neck: Negative for mass or adenopathy Upper chest: Lung apices clear bilaterally. Review of the MIP images confirms the above findings CTA HEAD FINDINGS Anterior circulation: Internal carotid artery widely patent to the skull base and cavernous segments. Anterior and middle cerebral arteries patent bilaterally without large vessel occlusion. Mild atherosclerotic irregularity in the middle cerebral artery branches bilaterally. Mild atherosclerotic irregularity in the M1 segment bilaterally. Posterior circulation: Both vertebral arteries patent to the basilar. PICA patent bilaterally. Basilar widely patent. Superior cerebellar and posterior cerebral arteries patent bilaterally. Fetal origin right posterior cerebral artery. Mild atherosclerotic irregularity in the  P2 and P3 segments bilaterally. Venous sinuses: Normal venous enhancement Anatomic variants: None Review of the MIP images confirms the above findings IMPRESSION: 1. Negative for intracranial large vessel occlusion. 2. Mild atherosclerotic irregularity in the middle cerebral arteries bilaterally. Mild atherosclerotic irregularity in the posterior cerebral artery bilaterally. 3. No significant carotid or vertebral artery stenosis in the neck. Electronically Signed   By: Marlan Palau M.D.   On: 10/19/2022 13:54   MR BRAIN WO CONTRAST  Result Date: 10/19/2022 CLINICAL DATA:  Neuro deficit, acute, stroke suspected EXAM: MRI HEAD WITHOUT CONTRAST MRA HEAD WITHOUT CONTRAST TECHNIQUE: Multiplanar, multi-echo pulse  sequences of the brain and surrounding structures were acquired without intravenous contrast. Angiographic images of the Circle of Willis were acquired using MRA technique without intravenous contrast. COMPARISON:  None Available. FINDINGS: MRI HEAD FINDINGS Brain: Small acute left thalamocapsular infarct. Mild associated edema without significant mass effect. Midline shift. No evidence of acute hemorrhage, mass lesion, midline shift or hydrocephalus. Vascular: See below. Skull and upper cervical spine: Normal marrow signal. Sinuses/Orbits: Clear sinuses.  No acute orbital findings. Other: No mastoid effusions. MRA HEAD FINDINGS Anterior circulation: Bilateral intracranial ICAs, MCAs, and ACAs are patent without proximal high-grade stenosis. Posterior circulation: Bilateral intradural vertebral arteries, basilar artery and bilateral posterior cerebral arteries are patent. At least moderate stenosis of the right P2 PCA appear IMPRESSION: 1. Small acute left thalamocapsular infarct. 2. No emergent large vessel occlusion. 3. At least moderate right P2 PCA stenosis. Electronically Signed   By: Feliberto Harts M.D.   On: 10/19/2022 13:11   MR ANGIO HEAD WO CONTRAST  Result Date: 10/19/2022 CLINICAL DATA:  Neuro deficit, acute, stroke suspected EXAM: MRI HEAD WITHOUT CONTRAST MRA HEAD WITHOUT CONTRAST TECHNIQUE: Multiplanar, multi-echo pulse sequences of the brain and surrounding structures were acquired without intravenous contrast. Angiographic images of the Circle of Willis were acquired using MRA technique without intravenous contrast. COMPARISON:  None Available. FINDINGS: MRI HEAD FINDINGS Brain: Small acute left thalamocapsular infarct. Mild associated edema without significant mass effect. Midline shift. No evidence of acute hemorrhage, mass lesion, midline shift or hydrocephalus. Vascular: See below. Skull and upper cervical spine: Normal marrow signal. Sinuses/Orbits: Clear sinuses.  No acute orbital  findings. Other: No mastoid effusions. MRA HEAD FINDINGS Anterior circulation: Bilateral intracranial ICAs, MCAs, and ACAs are patent without proximal high-grade stenosis. Posterior circulation: Bilateral intradural vertebral arteries, basilar artery and bilateral posterior cerebral arteries are patent. At least moderate stenosis of the right P2 PCA appear IMPRESSION: 1. Small acute left thalamocapsular infarct. 2. No emergent large vessel occlusion. 3. At least moderate right P2 PCA stenosis. Electronically Signed   By: Feliberto Harts M.D.   On: 10/19/2022 13:11   CT ABDOMEN PELVIS W CONTRAST  Result Date: 10/19/2022 CLINICAL DATA:  Acute, nonlocalized abdominal pain EXAM: CT ABDOMEN AND PELVIS WITH CONTRAST TECHNIQUE: Multidetector CT imaging of the abdomen and pelvis was performed using the standard protocol following bolus administration of intravenous contrast. RADIATION DOSE REDUCTION: This exam was performed according to the departmental dose-optimization program which includes automated exposure control, adjustment of the mA and/or kV according to patient size and/or use of iterative reconstruction technique. CONTRAST:  OMNIPAQUE IOHEXOL 300 MG/ML  SOLN COMPARISON:  08/02/2022 FINDINGS: Lower chest:  Pectus excavatum deforming the right heart. Hepatobiliary: No focal liver abnormality.No evidence of biliary obstruction or stone. Pancreas: Unremarkable. Spleen: Unremarkable. Adrenals/Urinary Tract: Negative adrenals. No hydronephrosis or stone. Tiny renal cystic densities. No indication of pyelonephritis. Unremarkable bladder. Stomach/Bowel: Submucosal low-density thickening  involving the sigmoid colon around a thick-walled gaseous collection measuring 9 mm along the inferior and posterior wall of the affected segment. Colonic diverticulosis is extensive. Trace pelvic fluid. No bowel obstruction Vascular/Lymphatic: Scattered atheromatous calcification of the aorta and visceral origins. No mass  or adenopathy. Reproductive:Hysterectomy Other: Trace pelvic fluid.  No pneumoperitoneum. Musculoskeletal: Lumbar spine degeneration with notable facet spurring and lumbar lordosis. IMPRESSION: Sigmoid diverticulitis with 9 mm gaseous collection that could be in or adjacent to the offending diverticulum. Electronically Signed   By: Tiburcio Pea M.D.   On: 10/19/2022 10:35   CT HEAD WO CONTRAST ( )  Result Date: 10/19/2022 CLINICAL DATA:  Status change. Right hand arm and numbness 4 days ago. EXAM: CT HEAD WITHOUT CONTRAST TECHNIQUE: Contiguous axial images were obtained from the base of the skull through the vertex without intravenous contrast. RADIATION DOSE REDUCTION: This exam was performed according to the departmental dose-optimization program which includes automated exposure control, adjustment of the mA and/or kV according to patient size and/or use of iterative reconstruction technique. COMPARISON:  CT head 08/01/2022 FINDINGS: Brain: 1 cm hypodensity left thalamus is new since the prior study compatible with infarct which may be recent. Generalized atrophy. Mild patchy white matter hypodensity bilaterally compatible with chronic microvascular ischemia. Negative for acute hemorrhage or mass Vascular: Negative for hyperdense vessel Skull: Negative Sinuses/Orbits: Paranasal sinuses clear. Bilateral cataract extraction Other: None IMPRESSION: 1. 1 cm hypodensity left thalamus compatible with infarct which may be recent. No acute hemorrhage. 2. Atrophy and chronic microvascular ischemia. Electronically Signed   By: Marlan Palau M.D.   On: 10/19/2022 10:34    Anti-infectives: Anti-infectives (From admission, onward)    Start     Dose/Rate Route Frequency Ordered Stop   10/19/22 2200  ciprofloxacin (CIPRO) IVPB 400 mg        400 mg 200 mL/hr over 60 Minutes Intravenous Every 12 hours 10/19/22 1246     10/19/22 2200  metroNIDAZOLE (FLAGYL) IVPB 500 mg        500 mg 100 mL/hr over 60 Minutes  Intravenous Every 12 hours 10/19/22 1246     10/19/22 1045  ciprofloxacin (CIPRO) IVPB 400 mg        400 mg 200 mL/hr over 60 Minutes Intravenous  Once 10/19/22 1040 10/19/22 1440   10/19/22 1045  metroNIDAZOLE (FLAGYL) IVPB 500 mg        500 mg 100 mL/hr over 60 Minutes Intravenous  Once 10/19/22 1040 10/19/22 1338       Assessment/Plan: Pression: Sigmoid diverticulitis with contained perforation.  Leukocytosis has resolved.  Will advance to full liquid diet.  Advance diet as tolerated.  LOS: 1 day    Ayeza Therriault 10/20/2022

## 2022-10-20 NOTE — Evaluation (Signed)
Physical Therapy Evaluation Patient Details Name: Kayla Terry MRN: [id number] DOB: 1946/08/13 Today's Date: 10/20/2022  History of Present Illness  Kayla Terry is a 76 year old female with history of GERD, hypertension, hyperlipidemia, Mobitz type II second-degree AV block, hiatal hernia, fatty liver, coronary artery disease, deficiency and seasonal allergies presented to the ED complaining of right arm and hand numbness that started approximately 4 to 5 days ago.  She reports that she initially was unable to hold onto things or grabs things and was dropping objects frequently.  She also started noticing numbness to the left hand and left upper thigh.  She did not seek medical care at that time.  She presents today complaining of abdominal cramping in the left upper quadrant area.  She reports that the pain seems to be worse with urination.  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  It was noted to be 9 mm in size.  She was seen by Dr. Lovell Sheehan with general surgery who recommended conservative management with IV antibiotics and bowel rest.  Will also need to undergo a colonoscopy outpatient when medically stabilized.  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  She is being admitted for further management, IV antibiotics and workup for possible acute CVA.   Clinical Impression  Patient demonstrates good return for bed mobility and transferring to chair without AD, but has to slow cadence and lean on nearby objects for support when taking steps without AD, required use of RW for safety, limited for ambulation mostly due to c/o nausea and fatigue.  Patient tolerated sitting up in chair after therapy and encouraged to ambulate with nursing staff as tolerated.  Patient will benefit from continued skilled physical therapy in hospital and recommended venue  below to increase strength, balance, endurance for safe ADLs and gait.       Recommendations for follow up therapy are one component of a multi-disciplinary discharge planning process, led by the attending physician.  Recommendations may be updated based on patient status, additional functional criteria and insurance authorization.  Follow Up Recommendations Home health PT      Assistance Recommended at Discharge Set up Supervision/Assistance  Patient can return home with the following  A little help with walking and/or transfers;A little help with bathing/dressing/bathroom;Help with stairs or ramp for entrance;Assistance with cooking/housework    Equipment Recommendations None recommended by PT  Recommendations for Other Services       Functional Status Assessment Patient has had a recent decline in their functional status and demonstrates the ability to make significant improvements in function in a reasonable and predictable amount of time.     Precautions / Restrictions Precautions Precautions: Fall Restrictions Weight Bearing Restrictions: No      Mobility  Bed Mobility Overal bed mobility: Needs Assistance Bed Mobility: Supine to Sit     Supine to sit: Modified independent (Device/Increase time), Supervision     General bed mobility comments: slightly increased time    Transfers Overall transfer level: Needs assistance Equipment used: Rolling walker (2 wheels), None Transfers: Sit to/from Stand, Bed to chair/wheelchair/BSC Sit to Stand: Supervision   Step pivot transfers: Supervision       General transfer comment: slightly labored movement, has to lean on nearby objects for support when not using an AD, safer using RW    Ambulation/Gait Ambulation/Gait assistance: Supervision Gait Distance (Feet):  35 Feet Assistive device: Rolling walker (2 wheels) Gait Pattern/deviations: Decreased step length - left, Decreased stance time - right, Decreased stride  length Gait velocity: decreased     General Gait Details: slightly labored cadence without loss of balance and limited mostly due to c/o nausea  Stairs            Wheelchair Mobility    Modified Rankin (Stroke Patients Only)       Balance Overall balance assessment: Needs assistance Sitting-balance support: Feet supported, No upper extremity supported Sitting balance-Leahy Scale: Good Sitting balance - Comments: seated at EOB   Standing balance support: During functional activity, No upper extremity supported Standing balance-Leahy Scale: Poor Standing balance comment: fair/poor without AD, fair/good using RW or pushing IV pole                             Pertinent Vitals/Pain Pain Assessment Pain Assessment: No/denies pain    Home Living Family/patient expects to be discharged to:: Private residence Living Arrangements: Alone Available Help at Discharge: Family;Available PRN/intermittently Type of Home: House Home Access: Ramped entrance       Home Layout: One level Home Equipment: Cane - single Librarian, academic (2 wheels);Shower seat - built in;Grab bars - tub/shower Additional Comments: grab bars both tub/shower and walk in shower, tub/shower has seat built in tub    Prior Function Prior Level of Function : Independent/Modified Independent             Mobility Comments: household and short distanced community ambulator without AD ADLs Comments: Independent household, assisted for community ADLs by family     Hand Dominance        Extremity/Trunk Assessment   Upper Extremity Assessment Upper Extremity Assessment: Overall WFL for tasks assessed    Lower Extremity Assessment Lower Extremity Assessment: Generalized weakness    Cervical / Trunk Assessment Cervical / Trunk Assessment: Normal  Communication   Communication: No difficulties  Cognition Arousal/Alertness: Awake/alert Behavior During Therapy: WFL for tasks  assessed/performed Overall Cognitive Status: Within Functional Limits for tasks assessed                                          General Comments      Exercises     Assessment/Plan    PT Assessment Patient needs continued PT services  PT Problem List Decreased strength;Decreased activity tolerance;Decreased balance;Decreased mobility       PT Treatment Interventions DME instruction;Gait training;Stair training;Therapeutic activities;Therapeutic exercise;Functional mobility training;Patient/family education;Balance training    PT Goals (Current goals can be found in the Care Plan section)  Acute Rehab PT Goals Patient Stated Goal: return home with family to assist PT Goal Formulation: With patient Time For Goal Achievement: 10/24/22 Potential to Achieve Goals: Good    Frequency Min 3X/week     Co-evaluation               AM-PAC PT "6 Clicks" Mobility  Outcome Measure Help needed turning from your back to your side while in a flat bed without using bedrails?: None Help needed moving from lying on your back to sitting on the side of a flat bed without using bedrails?: None Help needed moving to and from a bed to a chair (including a wheelchair)?: A Little Help needed standing up from a chair using your arms (e.g., wheelchair or  bedside chair)?: A Little Help needed to walk in hospital room?: A Little Help needed climbing 3-5 steps with a railing? : A Little 6 Click Score: 20    End of Session   Activity Tolerance: Patient tolerated treatment well;Patient limited by fatigue Patient left: in chair;with call bell/phone within reach Nurse Communication: Mobility status PT Visit Diagnosis: Unsteadiness on feet (R26.81);Other abnormalities of gait and mobility (R26.89);Muscle weakness (generalized) (M62.81)    Time: 1219-7588 PT Time Calculation (min) (ACUTE ONLY): 23 min   Charges:   PT Evaluation $PT Eval Moderate Complexity: 1 Mod PT  Treatments $Therapeutic Activity: 23-37 mins        3:00 PM, 10/20/22 Ocie Bob, MPT Physical Therapist with [facility] [phone] office [phone] mobile phone

## 2022-10-20 NOTE — Plan of Care (Signed)
  Problem: Acute Rehab PT Goals(only PT should resolve) Goal: Pt Will Go Supine/Side To Sit Outcome: Progressing Flowsheets (Taken 10/20/2022 1502) Pt will go Supine/Side to Sit: Independently Goal: Patient Will Transfer Sit To/From Stand Outcome: Progressing Flowsheets (Taken 10/20/2022 1502) Patient will transfer sit to/from stand: with modified independence Goal: Pt Will Transfer Bed To Chair/Chair To Bed Outcome: Progressing Flowsheets (Taken 10/20/2022 1502) Pt will Transfer Bed to Chair/Chair to Bed: with modified independence Goal: Pt Will Ambulate Outcome: Progressing Flowsheets (Taken 10/20/2022 1502) Pt will Ambulate:  75 feet  with modified independence  with rolling walker   3:02 PM, 10/20/22 Ocie Bob, MPT Physical Therapist with [facility] [phone] office [phone] mobile phone

## 2022-10-20 NOTE — Consult Note (Signed)
I connected with  Kayla Terry on 10/20/22 by a video enabled telemedicine application and verified that I am speaking with the correct person using two identifiers.   I discussed the limitations of evaluation and management by telemedicine. The patient expressed understanding and agreed to proceed.  Location of patient: [facility] Location of physician: [facility]  Neurology Consultation Reason for Consult: stroke Referring Physician: Dr Irwin Brakeman  CC: Right arm and hand numbness, numbness of left hand and left thigh  History is obtained from: patient, chart review  HPI: Kayla Terry is a 76 y.o. 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 Patient was diagnosed with sigmoid diverticulitis which is being managed by surgery team.  Patient states she went to bed somewhere between 8 to 9 PM on Friday 10/14/2022.  She remembers waking up around 3 AM on 10/15/2022 and was at baseline.  However, when she woke up at around 7 AM she noticed her right hand and foot was numb and weak and remained weak.  Yesterday when she presented to the emergency room she also reported some weakness in the left arm as well as left thigh which is different than her baseline paresthesias.  Last known normal: 10/15/2022 ~ 3 AM Event happened at home No tPA as outside window No thrombectomy as no LVO mRS 0  ROS: All other systems reviewed and negative except as noted in the HPI.   Past Medical History:  Diagnosis Date   Allergy    B12 deficiency 04/2020   Cataract    Coronary artery disease    COVID-19 04/22/2021   Fatty liver    GERD (gastroesophageal reflux disease)    Herpes zoster without complications    Hiatal hernia     Hypertension    Mild mitral regurgitation 2013   Mobitz type 2 second degree atrioventricular block    Pectus excavatum    pectus excavatum deformity resulting in mass effect on the RV by CT 03/2020   Pulmonary nodule    Shingles     Family History  Problem Relation Age of Onset   Cancer Mother    Leukemia Mother    Lung cancer Father    Multiple myeloma Father    Multiple sclerosis Sister    Alcohol abuse Brother    Multiple myeloma Brother    Colon cancer Neg Hx    Colon polyps Neg Hx    Stomach cancer Neg Hx    Esophageal cancer Neg Hx    Neuropathy Neg Hx    Social History:  reports that she has never smoked. She has never used smokeless tobacco. She reports that she does not drink alcohol and does not use drugs.  Medications Prior to Admission  Medication Sig Dispense Refill Last Dose   gabapentin (NEURONTIN) 300 MG capsule Take 300 mg by mouth 3 (three) times daily as needed (pain).   Past Week   HYDROcodone-acetaminophen (NORCO/VICODIN) 5-325 MG tablet Take 1 tablet by mouth 2 (two) times daily as needed for moderate pain (10/18/2022).   Past Week   Propylene Glycol (SYSTANE BALANCE) 0.6 % SOLN Place 1 drop into both eyes daily as needed (dry eye).   Past Week   traZODone (DESYREL) 100 MG tablet Take 50 mg  by mouth at bedtime.   Past Week   zolpidem (AMBIEN) 10 MG tablet Take 10 mg by mouth every evening.   Past Week   amLODipine (NORVASC) 5 MG tablet Take 1 tablet (5 mg total) by mouth daily. (Patient not taking: Reported on 10/19/2022) 30 tablet 1 Not Taking      Exam: Current vital signs: BP 135/79   Pulse 73   Temp 98.1 F (36.7 C) (Oral)   Resp 18   Ht _0  (1.6 m)   Wt 60.6 kg   SpO2 95%   BMI 23.67 kg/m  Vital signs in last 24 hours: Temp:  [97.4 F (36.3 C)-98.7 F (37.1 C)] 98.1 F (36.7 C) (12/21 0344) Pulse Rate:  [66-87] 73 (12/21 0344) Resp:  [18-20] 18 (12/21 0344) BP: (115-176)/(60-90) 135/79 (12/21 0859) SpO2:  [94 %-99 %] 95 % (12/21  0344) Weight:  [60.6 kg] 60.6 kg (12/20 1540)   Physical Exam  Constitutional: Appears well-developed and well-nourished.  Psych: Affect appropriate to situation Neuro: AOx3, cranial nerves II to XII grossly intact, antigravity strength without drift in all 4 extremities, decree sensation to light touch in right upper extremity, FTN intact bilaterally  NIHSS 1   1A: Level of consciousness --> 0 = Alert; keenly responsive 1B: Ask month and age --> 0 = Both questions right 1C: 'Blink eyes' & 'squeeze hands' --> 0 = Performs both tasks 2: Horizontal extraocular movements --> 0 = Normal 3: Visual fields --> 0 = No visual loss 4: Facial palsy --> 0 = Normal symmetry 5A: Left arm motor drift --> 0 = No drift for 10 seconds 5B: Right arm motor drift --> 0 = No drift for 10 seconds 6A: Left leg motor drift --> 0 = No drift for 5 seconds 6B: Right leg motor drift --> 0 = No drift for 5 seconds 7: Limb Ataxia --> 0 = No ataxia 8: Sensation --> 1 = Mild-moderate loss: less sharp/more dull  9: Language/aphasia --> 0 = Normal; no aphasia 10: Dysarthria --> 0 = Normal 11: Extinction/inattention --> 0 = No abnormality  I have reviewed labs in epic and the results pertinent to this consultation are: CBC:  Recent Labs  Lab 10/19/22 0739 10/20/22 0409  WBC 13.3* 10.4  NEUTROABS 11.3* 8.8*  HGB 14.2 11.2*  HCT 43.4 34.6*  MCV 89.7 91.1  PLT 274 960    Basic Metabolic Panel:  Lab Results  Component Value Date   NA 138 10/20/2022   K 3.3 (L) 10/20/2022   CO2 28 10/20/2022   GLUCOSE 104 (H) 10/20/2022   BUN 8 10/20/2022   CREATININE 0.84 10/20/2022   CALCIUM 8.6 (L) 10/20/2022   GFRNONAA >60 10/20/2022   GFRAA 68 05/06/2020   Lipid Panel:  Lab Results  Component Value Date   LDLCALC 162 (H) 10/19/2022   HgbA1c:  Lab Results  Component Value Date   HGBA1C 5.6 10/19/2022   Urine Drug Screen: No results found for: "LABOPIA", "COCAINSCRNUR", "LABBENZ", "AMPHETMU", "THCU",  "LABBARB"  Alcohol Level     Component Value Date/Time   ETH <10 10/20/2021 0858     I have reviewed the images obtained:  CT head without contrast 10/19/2022: 1 cm hypodensity left thalamus compatible with infarct which may be recent. No acute hemorrhage.  Atrophy and chronic microvascular ischemia.  CTA head and neck with and without contrast 10/19/2022: 1. Negative for intracranial large vessel occlusion. 2. Mild atherosclerotic irregularity in the middle cerebral arteries bilaterally. Mild atherosclerotic irregularity  in the posterior cerebral artery bilaterally. 3. No significant carotid or vertebral artery stenosis in the neck.  MRI brain and MRA head without contrast 10/19/2022: 1. Small acute left thalamocapsular infarct. 2. No emergent large vessel occlusion. 3. At least moderate right P2 PCA stenosis.  TTE 10/19/2022: Left ventricular ejection fraction 55 to 60%, no regional wall motion abnormalities, no thrombus, no vegetations.    ASSESSMENT/PLAN: 77 year old female who presented with sudden onset right-sided numbness since 10/15/2022.  Of note after arrival patient also reported some numbness on the left side, mainly left thigh.  Acute ischemic stroke, left thalamus Hyperlipidemia Hypokalemia Hypoproteinemia with hypoalbuminemia -Etiology: Small vessel disease -Right-sided paresthesias could be due to right P2 PCA stenosis noted on MRA or due to her baseline cervical stenosis.    Recommendations: -Recommend aspirin 81 mg daily as well as atorvastatin 40 mg daily for secondary stroke prevention -Stroke is already completed therefore no need for permissive hypertension.  Goal blood pressure: Normotension -Modification of stroke risk factors -Stroke education including BEFAST -PT/OT -Can consider MRI thoracic and lumbar spine imaging as outpatient for left-sided paresthesias if needed. -Follow-up with neurology Dr Jaynee Eagles in 3 months -Discussed plan with patient and  Dr. Wynetta Emery   Thank you for allowing [place] to participate in the care of this patient. If you have any further questions, please contact  me or neurohospitalist.   [facility] neurohospitalist

## 2022-10-20 NOTE — Progress Notes (Addendum)
PROGRESS NOTE   Kayla Terry  ZOX:[id number] DOB: 07/01/1946 DOA: 10/19/2022 PCP: [name], MD   Chief Complaint  Patient presents with   Abdominal Pain   Level of care: Telemetry  Brief Admission History:  76 year old female with history of GERD, hypertension, hyperlipidemia, Mobitz type II second-degree AV block, hiatal hernia, fatty liver, coronary artery disease, deficiency and seasonal allergies presented to the ED complaining of right arm and hand numbness that started approximately 4 to 5 days ago.  She reports that she initially was unable to hold onto things or grabs things and was dropping objects frequently.  She also started noticing numbness to the left hand and left upper thigh.  She did not seek medical care at that time.  She presents today complaining of abdominal cramping in the left upper quadrant area.  She reports that the pain seems to be worse with urination.  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  It was noted to be 9 mm in size.  She was seen by Dr. Lovell Sheehan with general surgery who recommended conservative management with IV antibiotics and bowel rest.  Will also need to undergo a colonoscopy outpatient when medically stabilized.  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  She is being admitted for further management, IV antibiotics and workup for possible acute CVA.   Assessment and Plan:  Acute sigmoid diverticulitis -Contained perforation -Discussed with Dr. Lovell Sheehan treating conservatively with IV antibiotics and bowel rest -IV pain management, Cipro and Flagyl twice daily -advancing diet today -Follow clinical improvement -Advancing diet as tolerated   Leukocytosis - resolved  -Secondary to intra-abdominal infection -continue current treatments   Hypokalemia - give additional Mg 2gm IV today  -  replace potassium orally   Acute CVA -Symptoms started 4 to 5 days ago which is out of window for TPA treatment -MRI brain without contrast, CTA head and neck -TTE was reassuring with normal LVEF, grade 1 DD;  -discussed with neurologist Dr. Melynda Ripple, aspirin 81 mg with plavix x 21 days, then aspirin 81 mg daily, atorvastatin 40 mg daily  -Working to normalize blood pressure as it was likely 4-5 days since acute event -PT/OT/SLP evaluation -Check lipid panel and A1c -Gentle IV fluid hydration -Neurochecks -requested inpatient neurology consultation -Further recommendations to follow   Essential hypertension, poorly controlled -Patient reportedly is only been taking amlodipine intermittently -Restarted amlodipine 5 mg daily, monitor BP response -Added hydralazine 25 mg 3 times daily, IV ordered for SBP greater than 160 -Working to normalize blood pressure now that we are 5 days out since acute event   Hyperlipidemia -Follow-up lipid panel and treat with statin therapy   CAD -Stable no chest pain symptoms -Follow-up lipid panel and A1c    DVT prophylaxis: enoxaparin Code Status: full Family Communication:  Disposition: Status is: Inpatient Remains inpatient appropriate because: IV antibiotics required    Consultants:  Surgery neurology Procedures:   Antimicrobials:  Cipro 12/20>> Flagyl 12/20>>  Subjective: Intermittent right handed numbness persists; still having nausea and abdominal pain LLQ;  Objective: Vitals:   10/19/22 2303 10/20/22 0344 10/20/22 0703 10/20/22 0859  BP: 118/64 115/60 139/76 135/79  Pulse: 70 73    Resp: 20 18    Temp: (!) 97.4 F (36.3 C) 98.1 F (36.7 C)    TempSrc: Oral Oral    SpO2: 95% 95%  Weight:      Height:        Intake/Output Summary (Last 24 hours) at 10/20/2022 1053 Last data filed at 10/20/2022 0902 Gross per 24 hour  Intake 1015.09 ml  Output --  Net 1015.09 ml   Filed Weights   10/19/22 0731 10/19/22 1540  Weight:  56.7 kg 60.6 kg   Examination:  General exam: Appears calm and comfortable  Respiratory system: Clear to auscultation. Respiratory effort normal. Cardiovascular system: normal S1 & S2 heard. No JVD, murmurs, rubs, gallops or clicks. No pedal edema. Gastrointestinal system: Abdomen is nondistended, soft and with LLQ tenderness. No organomegaly or masses felt. Normal bowel sounds heard. Central nervous system: Alert and oriented. Mild right hemiparesis strength 4/5 RUE/RLE; 5/5 LUE/LLE. Extremities: Symmetric 5 x 5 power. Skin: No rashes, lesions or ulcers. Psychiatry: Judgement and insight appear normal. Mood & affect appropriate.   Data Reviewed: I have personally reviewed following labs and imaging studies  CBC: Recent Labs  Lab 10/19/22 0739 10/20/22 0409  WBC 13.3* 10.4  NEUTROABS 11.3* 8.8*  HGB 14.2 11.2*  HCT 43.4 34.6*  MCV 89.7 91.1  PLT 274 197    Basic Metabolic Panel: Recent Labs  Lab 10/19/22 0739 10/20/22 0409  NA 138 138  K 3.6 3.3*  CL 100 102  CO2 27 28  GLUCOSE 118* 104*  BUN 8 8  CREATININE 0.93 0.84  CALCIUM 9.7 8.6*  MG  --  1.8    CBG: No results for input(s): "GLUCAP" in the last 168 hours.  Recent Results (from the past 240 hour(s))  Resp panel by RT-PCR (RSV, Flu A&B, Covid) Anterior Nasal Swab     Status: None   Collection Time: 10/19/22  8:21 AM   Specimen: Anterior Nasal Swab  Result Value Ref Range Status   SARS Coronavirus 2 by RT PCR NEGATIVE NEGATIVE Final    Comment: (NOTE) SARS-CoV-2 target nucleic acids are NOT DETECTED.  The SARS-CoV-2 RNA is generally detectable in upper respiratory specimens during the acute phase of infection. The lowest concentration of SARS-CoV-2 viral copies this assay can detect is 138 copies/mL. A negative result does not preclude SARS-Cov-2 infection and should not be used as the sole basis for treatment or other patient management decisions. A negative result may occur with  improper specimen  collection/handling, submission of specimen other than nasopharyngeal swab, presence of viral mutation(s) within the areas targeted by this assay, and inadequate number of viral copies(<138 copies/mL). A negative result must be combined with clinical observations, patient history, and epidemiological information. The expected result is Negative.  Fact Sheet for Patients:  BloggerCourse.com  Fact Sheet for Healthcare Providers:  SeriousBroker.it  This test is no t yet approved or cleared by the [place] FDA and  has been authorized for detection and/or diagnosis of SARS-CoV-2 by FDA under an Emergency Use Authorization (EUA). This EUA will remain  in effect (meaning this test can be used) for the duration of the COVID-19 declaration under Section 564(b)(1) of the Act, 21 U.S.C.section 360bbb-3(b)(1), unless the authorization is terminated  or revoked sooner.       Influenza A by PCR NEGATIVE NEGATIVE Final   Influenza B by PCR NEGATIVE NEGATIVE Final    Comment: (NOTE) The Xpert Xpress SARS-CoV-2/FLU/RSV plus assay is intended as an aid in the diagnosis of influenza from Nasopharyngeal swab specimens and should not be used as a sole basis for treatment. Nasal washings and aspirates are unacceptable for Xpert Xpress SARS-CoV-2/FLU/RSV testing.  Fact Sheet for Patients: BloggerCourse.com  Fact Sheet for Healthcare Providers: SeriousBroker.it  This test is not yet approved or cleared by the [place] FDA and has been authorized for detection and/or diagnosis of SARS-CoV-2 by FDA under an Emergency Use Authorization (EUA). This EUA will remain in effect (meaning this test can be used) for the duration of the COVID-19 declaration under Section 564(b)(1) of the Act, 21 U.S.C. section 360bbb-3(b)(1), unless the authorization is terminated or revoked.     Resp Syncytial  Virus by PCR NEGATIVE NEGATIVE Final    Comment: (NOTE) Fact Sheet for Patients: BloggerCourse.com  Fact Sheet for Healthcare Providers: SeriousBroker.it  This test is not yet approved or cleared by the [place] FDA and has been authorized for detection and/or diagnosis of SARS-CoV-2 by FDA under an Emergency Use Authorization (EUA). This EUA will remain in effect (meaning this test can be used) for the duration of the COVID-19 declaration under Section 564(b)(1) of the Act, 21 U.S.C. section 360bbb-3(b)(1), unless the authorization is terminated or revoked.  Performed at [facility], [address], [place], [place] [address]   Blood culture (routine x 2)     Status: None (Preliminary result)   Collection Time: 10/19/22 11:02 AM   Specimen: Left Antecubital; Blood  Result Value Ref Range Status   Specimen Description LEFT ANTECUBITAL  Final   Special Requests   Final    BOTTLES DRAWN AEROBIC AND ANAEROBIC Blood Culture adequate volume Performed at [facility], [address] [place], [place] [address]    Culture PENDING  Incomplete   Report Status PENDING  Incomplete  Blood culture (routine x 2)     Status: None (Preliminary result)   Collection Time: 10/19/22 11:04 AM   Specimen: BLOOD RIGHT HAND  Result Value Ref Range Status   Specimen Description BLOOD RIGHT HAND  Final   Special Requests   Final    BOTTLES DRAWN AEROBIC AND ANAEROBIC Blood Culture adequate volume Performed at [facility], [address], [place], [place] [address]    Culture PENDING  Incomplete   Report Status PENDING  Incomplete     Radiology Studies: ECHOCARDIOGRAM COMPLETE  Result Date: 10/19/2022    ECHOCARDIOGRAM REPORT   Patient Name:   LATISHIA SUITT Date of Exam: 10/19/2022 Medical Rec #:  [id number]      Height:       63.0 in Accession #:    [id number]     Weight:       125.0 lb Date of Birth:  Jun 02, 1946      BSA:          1.584 m  Patient Age:    76 years       BP:           176/90 mmHg Patient Gender: F              HR:           68 bpm. [facility] Procedure: 2D Echo, Cardiac Doppler, Color Doppler and Intracardiac            Opacification Agent Indications:    Stroke  History:        Patient has prior history of Echocardiogram examinations, most                 recent 04/22/2021. CAD, Stroke, Signs/Symptoms:Chest Pain; Risk                 Factors:Hypertension and Dyslipidemia. Technically difficult  study due to pectus excavatum deformity.  Sonographer:    Mikki Harbororothy Buchanan Referring Phys: [phone] Conchetta Lamia L Elwanda Moger IMPRESSIONS  1. Left ventricular ejection fraction, by estimation, is 55 to 60%. The left ventricle has normal function. The left ventricle has no regional wall motion abnormalities. Left ventricular diastolic parameters are consistent with Grade I diastolic dysfunction (impaired relaxation).  2. Right ventricular systolic function is normal. The right ventricular size is normal. There is normal pulmonary artery systolic pressure.  3. The mitral valve is grossly normal. Mild mitral valve regurgitation. No evidence of mitral stenosis.  4. The aortic valve is tricuspid. There is mild calcification of the aortic valve. Aortic valve regurgitation is not visualized. No aortic stenosis is present. Comparison(s): No significant change from prior study. FINDINGS  Left Ventricle: Left ventricular ejection fraction, by estimation, is 55 to 60%. The left ventricle has normal function. The left ventricle has no regional wall motion abnormalities. Definity contrast agent was given IV to delineate the left ventricular  endocardial borders. The left ventricular internal cavity size was normal in size. There is no left ventricular hypertrophy. Left ventricular diastolic parameters are consistent with Grade I diastolic dysfunction (impaired relaxation). Right Ventricle: The right ventricular size is normal. Right  vetricular wall thickness was not well visualized. Right ventricular systolic function is normal. There is normal pulmonary artery systolic pressure. The tricuspid regurgitant velocity is 2.10 m/s, and with an assumed right atrial pressure of 3 mmHg, the estimated right ventricular systolic pressure is 20.6 mmHg. Left Atrium: Left atrial size was normal in size. Right Atrium: Right atrial size was normal in size. Pericardium: There is no evidence of pericardial effusion. Mitral Valve: The mitral valve is grossly normal. Mild mitral valve regurgitation. No evidence of mitral valve stenosis. MV peak gradient, 4.2 mmHg. The mean mitral valve gradient is 2.0 mmHg. Tricuspid Valve: The tricuspid valve is not well visualized. Tricuspid valve regurgitation is trivial. No evidence of tricuspid stenosis. Aortic Valve: The aortic valve is tricuspid. There is mild calcification of the aortic valve. Aortic valve regurgitation is not visualized. No aortic stenosis is present. Aortic valve mean gradient measures 4.0 mmHg. Aortic valve peak gradient measures 7.4 mmHg. Aortic valve area, by VTI measures 2.10 cm. Pulmonic Valve: The pulmonic valve was not well visualized. Pulmonic valve regurgitation is not visualized. No evidence of pulmonic stenosis. Aorta: The aortic root and ascending aorta are structurally normal, with no evidence of dilitation. Venous: The inferior vena cava was not well visualized. IAS/Shunts: The interatrial septum was not assessed.  LEFT VENTRICLE PLAX 2D LVIDd:         3.80 cm   Diastology LVIDs:         1.90 cm   LV e' medial:    6.53 cm/s LV PW:         1.10 cm   LV E/e' medial:  9.6 LV IVS:        1.10 cm   LV e' lateral:   10.00 cm/s LVOT diam:     1.80 cm   LV E/e' lateral: 6.3 LV SV:         63 LV SV Index:   40 LVOT Area:     2.54 cm  RIGHT VENTRICLE RV Basal diam:  3.20 cm RV Mid diam:    2.30 cm RV S prime:     10.80 cm/s TAPSE (M-mode): 2.2 cm LEFT ATRIUM             Index  RIGHT ATRIUM            Index LA diam:        3.30 cm 2.08 cm/m   RA Area:     12.90 cm LA Vol (A2C):   41.5 ml 26.21 ml/m  RA Volume:   29.10 ml  18.38 ml/m LA Vol (A4C):   26.0 ml 16.42 ml/m LA Biplane Vol: 32.8 ml 20.71 ml/m  AORTIC VALVE                    PULMONIC VALVE AV Area (Vmax):    2.23 cm     PV Vmax:       0.84 m/s AV Area (Vmean):   2.27 cm     PV Peak grad:  2.8 mmHg AV Area (VTI):     2.10 cm AV Vmax:           136.00 cm/s AV Vmean:          87.000 cm/s AV VTI:            0.299 m AV Peak Grad:      7.4 mmHg AV Mean Grad:      4.0 mmHg LVOT Vmax:         119.00 cm/s LVOT Vmean:        77.600 cm/s LVOT VTI:          0.247 m LVOT/AV VTI ratio: 0.83  AORTA Ao Root diam: 3.00 cm Ao Asc diam:  3.20 cm MITRAL VALVE               TRICUSPID VALVE MV Area (PHT): 3.34 cm    TR Peak grad:   17.6 mmHg MV Area VTI:   2.83 cm    TR Vmax:        210.00 cm/s MV Peak grad:  4.2 mmHg MV Mean grad:  2.0 mmHg    SHUNTS MV Vmax:       1.02 m/s    Systemic VTI:  0.25 m MV Vmean:      60.9 cm/s   Systemic Diam: 1.80 cm MV Decel Time: 227 msec MV E velocity: 62.60 cm/s MV A velocity: 95.50 cm/s MV E/A ratio:  0.66 Vishnu Priya Mallipeddi Electronically signed by Winfield Rast Mallipeddi Signature Date/Time: 10/19/2022/4:16:07 PM    Final    CT ANGIO HEAD NECK W WO CM  Result Date: 10/19/2022 CLINICAL DATA:  Acute neuro deficit.  Stroke. EXAM: CT ANGIOGRAPHY HEAD AND NECK TECHNIQUE: Multidetector CT imaging of the head and neck was performed using the standard protocol during bolus administration of intravenous contrast. Multiplanar CT image reconstructions and MIPs were obtained to evaluate the vascular anatomy. Carotid stenosis measurements (when applicable) are obtained utilizing NASCET criteria, using the distal internal carotid diameter as the denominator. RADIATION DOSE REDUCTION: This exam was performed according to the departmental dose-optimization program which includes automated exposure control, adjustment of the  mA and/or kV according to patient size and/or use of iterative reconstruction technique. CONTRAST:  60mL OMNIPAQUE IOHEXOL 350 MG/ML SOLN COMPARISON:  MRI head and CT head 10/19/2022 FINDINGS: CTA NECK FINDINGS Aortic arch: Standard branching. Imaged portion shows no evidence of aneurysm or dissection. No significant stenosis of the major arch vessel origins. Right carotid system: Right carotid widely patent. Minimal atherosclerotic calcification right carotid bifurcation Left carotid system: Left carotid widely patent. Mild atherosclerotic calcification proximal left internal carotid artery. Vertebral arteries: Both vertebral arteries patent to the skull base without significant stenosis. Skeleton: Cervical  spondylosis especially at C4-5, C5-6, C6-7 causing spinal and foraminal stenosis. No acute skeletal abnormality. Other neck: Negative for mass or adenopathy Upper chest: Lung apices clear bilaterally. Review of the MIP images confirms the above findings CTA HEAD FINDINGS Anterior circulation: Internal carotid artery widely patent to the skull base and cavernous segments. Anterior and middle cerebral arteries patent bilaterally without large vessel occlusion. Mild atherosclerotic irregularity in the middle cerebral artery branches bilaterally. Mild atherosclerotic irregularity in the M1 segment bilaterally. Posterior circulation: Both vertebral arteries patent to the basilar. PICA patent bilaterally. Basilar widely patent. Superior cerebellar and posterior cerebral arteries patent bilaterally. Fetal origin right posterior cerebral artery. Mild atherosclerotic irregularity in the P2 and P3 segments bilaterally. Venous sinuses: Normal venous enhancement Anatomic variants: None Review of the MIP images confirms the above findings IMPRESSION: 1. Negative for intracranial large vessel occlusion. 2. Mild atherosclerotic irregularity in the middle cerebral arteries bilaterally. Mild atherosclerotic irregularity in the  posterior cerebral artery bilaterally. 3. No significant carotid or vertebral artery stenosis in the neck. Electronically Signed   By: Marlan Palau M.D.   On: 10/19/2022 13:54   MR BRAIN WO CONTRAST  Result Date: 10/19/2022 CLINICAL DATA:  Neuro deficit, acute, stroke suspected EXAM: MRI HEAD WITHOUT CONTRAST MRA HEAD WITHOUT CONTRAST TECHNIQUE: Multiplanar, multi-echo pulse sequences of the brain and surrounding structures were acquired without intravenous contrast. Angiographic images of the Circle of Willis were acquired using MRA technique without intravenous contrast. COMPARISON:  None Available. FINDINGS: MRI HEAD FINDINGS Brain: Small acute left thalamocapsular infarct. Mild associated edema without significant mass effect. Midline shift. No evidence of acute hemorrhage, mass lesion, midline shift or hydrocephalus. Vascular: See below. Skull and upper cervical spine: Normal marrow signal. Sinuses/Orbits: Clear sinuses.  No acute orbital findings. Other: No mastoid effusions. MRA HEAD FINDINGS Anterior circulation: Bilateral intracranial ICAs, MCAs, and ACAs are patent without proximal high-grade stenosis. Posterior circulation: Bilateral intradural vertebral arteries, basilar artery and bilateral posterior cerebral arteries are patent. At least moderate stenosis of the right P2 PCA appear IMPRESSION: 1. Small acute left thalamocapsular infarct. 2. No emergent large vessel occlusion. 3. At least moderate right P2 PCA stenosis. Electronically Signed   By: Feliberto Harts M.D.   On: 10/19/2022 13:11   MR ANGIO HEAD WO CONTRAST  Result Date: 10/19/2022 CLINICAL DATA:  Neuro deficit, acute, stroke suspected EXAM: MRI HEAD WITHOUT CONTRAST MRA HEAD WITHOUT CONTRAST TECHNIQUE: Multiplanar, multi-echo pulse sequences of the brain and surrounding structures were acquired without intravenous contrast. Angiographic images of the Circle of Willis were acquired using MRA technique without intravenous  contrast. COMPARISON:  None Available. FINDINGS: MRI HEAD FINDINGS Brain: Small acute left thalamocapsular infarct. Mild associated edema without significant mass effect. Midline shift. No evidence of acute hemorrhage, mass lesion, midline shift or hydrocephalus. Vascular: See below. Skull and upper cervical spine: Normal marrow signal. Sinuses/Orbits: Clear sinuses.  No acute orbital findings. Other: No mastoid effusions. MRA HEAD FINDINGS Anterior circulation: Bilateral intracranial ICAs, MCAs, and ACAs are patent without proximal high-grade stenosis. Posterior circulation: Bilateral intradural vertebral arteries, basilar artery and bilateral posterior cerebral arteries are patent. At least moderate stenosis of the right P2 PCA appear IMPRESSION: 1. Small acute left thalamocapsular infarct. 2. No emergent large vessel occlusion. 3. At least moderate right P2 PCA stenosis. Electronically Signed   By: Feliberto Harts M.D.   On: 10/19/2022 13:11   CT ABDOMEN PELVIS W CONTRAST  Result Date: 10/19/2022 CLINICAL DATA:  Acute, nonlocalized abdominal pain EXAM: CT ABDOMEN AND  PELVIS WITH CONTRAST TECHNIQUE: Multidetector CT imaging of the abdomen and pelvis was performed using the standard protocol following bolus administration of intravenous contrast. RADIATION DOSE REDUCTION: This exam was performed according to the departmental dose-optimization program which includes automated exposure control, adjustment of the mA and/or kV according to patient size and/or use of iterative reconstruction technique. CONTRAST:  OMNIPAQUE IOHEXOL 300 MG/ML  SOLN COMPARISON:  08/02/2022 FINDINGS: Lower chest:  Pectus excavatum deforming the right heart. Hepatobiliary: No focal liver abnormality.No evidence of biliary obstruction or stone. Pancreas: Unremarkable. Spleen: Unremarkable. Adrenals/Urinary Tract: Negative adrenals. No hydronephrosis or stone. Tiny renal cystic densities. No indication of pyelonephritis.  Unremarkable bladder. Stomach/Bowel: Submucosal low-density thickening involving the sigmoid colon around a thick-walled gaseous collection measuring 9 mm along the inferior and posterior wall of the affected segment. Colonic diverticulosis is extensive. Trace pelvic fluid. No bowel obstruction Vascular/Lymphatic: Scattered atheromatous calcification of the aorta and visceral origins. No mass or adenopathy. Reproductive:Hysterectomy Other: Trace pelvic fluid.  No pneumoperitoneum. Musculoskeletal: Lumbar spine degeneration with notable facet spurring and lumbar lordosis. IMPRESSION: Sigmoid diverticulitis with 9 mm gaseous collection that could be in or adjacent to the offending diverticulum. Electronically Signed   By: Tiburcio Pea M.D.   On: 10/19/2022 10:35   CT HEAD WO CONTRAST ( )  Result Date: 10/19/2022 CLINICAL DATA:  Status change. Right hand arm and numbness 4 days ago. EXAM: CT HEAD WITHOUT CONTRAST TECHNIQUE: Contiguous axial images were obtained from the base of the skull through the vertex without intravenous contrast. RADIATION DOSE REDUCTION: This exam was performed according to the departmental dose-optimization program which includes automated exposure control, adjustment of the mA and/or kV according to patient size and/or use of iterative reconstruction technique. COMPARISON:  CT head 08/01/2022 FINDINGS: Brain: 1 cm hypodensity left thalamus is new since the prior study compatible with infarct which may be recent. Generalized atrophy. Mild patchy white matter hypodensity bilaterally compatible with chronic microvascular ischemia. Negative for acute hemorrhage or mass Vascular: Negative for hyperdense vessel Skull: Negative Sinuses/Orbits: Paranasal sinuses clear. Bilateral cataract extraction Other: None IMPRESSION: 1. 1 cm hypodensity left thalamus compatible with infarct which may be recent. No acute hemorrhage. 2. Atrophy and chronic microvascular ischemia. Electronically Signed    By: Marlan Palau M.D.   On: 10/19/2022 10:34    Scheduled Meds:  amLODipine  5 mg Oral Daily   aspirin  300 mg Rectal Daily   Or   aspirin  325 mg Oral Daily   atorvastatin  80 mg Oral QPM   enoxaparin (LOVENOX) injection  40 mg Subcutaneous Q24H   famotidine  20 mg Oral Daily   hydrALAZINE  25 mg Oral Q8H   traZODone  50 mg Oral QHS   zolpidem  5 mg Oral QPM   Continuous Infusions:  ciprofloxacin Stopped (10/19/22 2147)   lactated ringers 50 mL/hr at 10/20/22 0014   magnesium sulfate bolus IVPB 2 g (10/20/22 1051)   metronidazole 500 mg (10/20/22 0902)     LOS: 1 day   Time spent: 35 mins  Micai Apolinar Laural Benes, MD How to contact the [facility] Attending or Consulting provider 7A - 7P or covering provider during after hours 7P -7A, for this patient?  Check the care team in [facility] and look for a) attending/consulting [facility] provider listed and b) the [facility] team listed Log into [email or web address] and use [place]'s universal password to access. If you do not have the password, please contact the hospital operator. Locate the [facility] provider you are  looking for under [facility] and page to a number that you can be directly reached. If you still have difficulty reaching the provider, please page the [facility] (Director on Call) for the Hospitalists listed on amion for assistance.  10/20/2022, 10:53 AM

## 2022-10-21 DIAGNOSIS — D72829 Elevated white blood cell count, unspecified: Secondary | ICD-10-CM

## 2022-10-21 DIAGNOSIS — I639 Cerebral infarction, unspecified: Secondary | ICD-10-CM | POA: Diagnosis not present

## 2022-10-21 DIAGNOSIS — E782 Mixed hyperlipidemia: Secondary | ICD-10-CM

## 2022-10-21 DIAGNOSIS — K5732 Diverticulitis of large intestine without perforation or abscess without bleeding: Secondary | ICD-10-CM

## 2022-10-21 LAB — BASIC METABOLIC PANEL
Anion gap: 4 — ABNORMAL LOW (ref 5–15)
BUN: 5 mg/dL — ABNORMAL LOW (ref 8–23)
CO2: 29 mmol/L (ref 22–32)
Calcium: 8.6 mg/dL — ABNORMAL LOW (ref 8.9–10.3)
Chloride: 106 mmol/L (ref 98–111)
Creatinine, Ser: 0.91 mg/dL (ref 0.44–1.00)
GFR, Estimated: >60 mL/min (ref >60)
Glucose, Bld: 96 mg/dL (ref 70–99)
Potassium: 3.7 mmol/L (ref 3.5–5.1)
Sodium: 139 mmol/L (ref 135–145)

## 2022-10-21 MED ORDER — AMLODIPINE BESYLATE 5 MG PO TABS: 5.0000 mg | ORAL_TABLET | Freq: Every day | ORAL | 1 refills | Status: DC

## 2022-10-21 MED ORDER — ONDANSETRON HCL 4 MG PO TABS: 4.0000 mg | ORAL_TABLET | Freq: Four times a day (QID) | ORAL | 0 refills | Status: DC | PRN

## 2022-10-21 MED ORDER — CLOPIDOGREL BISULFATE 75 MG PO TABS: 75.0000 mg | ORAL_TABLET | Freq: Every day | ORAL | 0 refills | Status: AC

## 2022-10-21 MED ORDER — CIPROFLOXACIN HCL 500 MG PO TABS: 500.0000 mg | ORAL_TABLET | Freq: Two times a day (BID) | ORAL | 0 refills | Status: AC

## 2022-10-21 MED ORDER — ATORVASTATIN CALCIUM 40 MG PO TABS: 40.0000 mg | ORAL_TABLET | Freq: Every evening | ORAL | 1 refills | Status: DC

## 2022-10-21 MED ORDER — ASPIRIN 81 MG PO TBEC: 81.0000 mg | DELAYED_RELEASE_TABLET | Freq: Every day | ORAL | 12 refills | Status: DC

## 2022-10-21 MED ORDER — METRONIDAZOLE 500 MG PO TABS: 500.0000 mg | ORAL_TABLET | Freq: Two times a day (BID) | ORAL | 0 refills | Status: AC

## 2022-10-21 MED ORDER — LOSARTAN POTASSIUM 50 MG PO TABS: 50.0000 mg | ORAL_TABLET | Freq: Every day | ORAL | 1 refills | Status: DC

## 2022-10-21 NOTE — Care Management Important Message (Signed)
Important Message  Patient Details  Name: Kayla Terry MRN: [id number] Date of Birth: [birth year]   Medicare Important Message Given:  Yes     Corey Harold 10/21/2022, 9:37 AM

## 2022-10-21 NOTE — Progress Notes (Signed)
Patient is stable and ready for discharge home. Writer removed IV. Writer went over discharge paperwork with patient and her grandson josh her care giver and both verbalized understanding. Patient dressed herself. Patient transported via [facility] to grandson car.

## 2022-10-21 NOTE — TOC Transition Note (Signed)
Transition of Care [facility]) - CM/SW Discharge Note   Patient Details  Name: Raeven Pint MRN: [id number] Date of Birth: Nov 21, 1945  Transition of Care [facility] CM/SW Contact:  Leitha Bleak, RN Phone Number: 10/21/2022, 11:54 AM   Clinical Narrative:   Discharging home with home health. Denyse Amass updated with DC plan, orders placed.   Final next level of care: Home w Home Health Services Barriers to Discharge: Barriers Resolved   Patient Goals and CMS Choice   Choice offered to / list presented to : Patient  Discharge Placement      Discharge Plan and Services Additional resources added to the After Visit Summary for   In-house Referral: Clinical Social Work                        HH Arranged: PT [facility] Agency: [facility] Date [facility] Agency Contacted: 10/20/22 Time HH Agency Contacted: [id number] Representative spoke with at [facility] Agency: Kandee Keen  Social Determinants of Health (SDOH) Interventions SDOH Screenings   Food Insecurity: No Food Insecurity (10/19/2022)  Housing: Low Risk  (10/19/2022)  Transportation Needs: No Transportation Needs (10/19/2022)  Utilities: Not At Risk (10/19/2022)  Tobacco Use: Low Risk  (10/19/2022)    Readmission Risk Interventions    10/21/2022   11:53 AM  Readmission Risk Prevention Plan  Post Dischage Appt Complete  Medication Screening Complete  Transportation Screening Complete

## 2022-10-21 NOTE — Discharge Summary (Signed)
Physician Discharge Summary  Julanne Schlueter WUJ:[id number] DOB: 06-02-1946 DOA: 10/19/2022  PCP: [name], MD  Admit date: 10/19/2022 Discharge date: 10/21/2022  Admitted From:  Home  Disposition: Home   Recommendations for Outpatient Follow-up:  Follow up with PCP in 1 weeks Please obtain BMP/CBC in one week Take plavix and aspirin thru 11/10/22 then stop plavix but continue aspirin Please titrate BP meds with goal to normalize BP   Home Health:  PT   Discharge Condition: STABLE   CODE STATUS: FULL DIET: soft foods recommended    Brief Hospitalization Summary: Please see all hospital notes, images, labs for full details of the hospitalization. ADMISSION HPI:  76 year old female with history of GERD, hypertension, hyperlipidemia, Mobitz type II second-degree AV block, hiatal hernia, fatty liver, coronary artery disease, deficiency and seasonal allergies presented to the ED complaining of right arm and hand numbness that started approximately 4 to 5 days ago.  She reports that she initially was unable to hold onto things or grabs things and was dropping objects frequently.  She also started noticing numbness to the left hand and left upper thigh.  She did not seek medical care at that time.  She presents today complaining of abdominal cramping in the left upper quadrant area.  She reports that the pain seems to be worse with urination.  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  It was noted to be 9 mm in size.  She was seen by Dr. Lovell Sheehan with general surgery who recommended conservative management with IV antibiotics and bowel rest.  Will also need to undergo a colonoscopy outpatient when medically stabilized.  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  She is being admitted for further management, IV antibiotics and workup for possible  acute CVA.  HOSPITAL COURSE BY PROBLEM LIST   Acute sigmoid diverticulitis -Contained perforation -Discussed with Dr. Lovell Sheehan treated conservatively with IV antibiotics and bowel rest -IV pain management, Cipro and Flagyl twice daily with good improvement in symptoms -advanced diet and patient tolerating well soft diet. -DC home to complete 10 full days of antibiotic therapy    Leukocytosis - resolved  -Secondary to intra-abdominal infection -continue current treatments    Hypokalemia - give additional Mg 2gm IV today  - repleted   Acute CVA -Symptoms started 4 to 5 days ago which is out of window for TPA treatment -MRI brain without contrast, CTA head and neck -TTE was reassuring with normal LVEF, grade 1 DD;  -discussed with neurologist Dr. Melynda Ripple, aspirin 81 mg with plavix x 21 days, then aspirin 81 mg daily, atorvastatin 40 mg daily  -Working to normalize blood pressure as it was likely 4-5 days since acute event -PT/OT/SLP evaluation - with recommendation for HHPT  -Atorvastatin 40 mg daily ordered to lower LDL cholesterol -Gentle IV fluid hydration -Neurochecks completed  -appreciate inpatient neurology consultation -outpatient follow up with [name] in 3 months (neurology)   Essential hypertension, poorly controlled -Patient reportedly has only been taking amlodipine intermittently -Restarted amlodipine 5 mg daily, losartan 50 mg daily added -outpatient follow up with PCP for further BP management with goal to normalize BP    Hyperlipidemia -atorvastatin 40 mg daily ordered    CAD -Stable no chest pain symptoms -aspirin/atorvastatin/working to normalize BP   Discharge Diagnoses:  Principal Problem:   Sigmoid diverticulitis Active Problems:  Acid reflux disease   Benign essential hypertension   Hyperlipidemia   Restless leg syndrome   Muscle weakness   B12 deficiency   Cervical disc disorder with myelopathy   2nd degree AV block   Coronary artery  disease   Leukocytosis   Acute stroke due to ischemia [facility])   Discharge Instructions: Discharge Instructions     Ambulatory referral to Neurology   Complete by: As directed    An appointment is requested in approximately: 12 weeks      Allergies as of 10/21/2022       Reactions   Escitalopram Nausea Only, Other (See Comments)   Nausea and dizziness   Pantoprazole Other (See Comments)   Severe headaches   Naproxen Sodium Anxiety        Medication List     TAKE these medications    amLODipine 5 MG tablet Commonly known as: NORVASC Take 1 tablet (5 mg total) by mouth daily.   aspirin EC 81 MG tablet Take 1 tablet (81 mg total) by mouth daily. Swallow whole. Start taking on: October 22, 2022   atorvastatin 40 MG tablet Commonly known as: LIPITOR Take 1 tablet (40 mg total) by mouth every evening.   ciprofloxacin 500 MG tablet Commonly known as: Cipro Take 1 tablet (500 mg total) by mouth 2 (two) times daily for 7 days.   clopidogrel 75 MG tablet Commonly known as: PLAVIX Take 1 tablet (75 mg total) by mouth daily for 19 days. Start taking on: October 22, 2022   gabapentin 300 MG capsule Commonly known as: NEURONTIN Take 300 mg by mouth 3 (three) times daily as needed (pain).   HYDROcodone-acetaminophen 5-325 MG tablet Commonly known as: NORCO/VICODIN Take 1 tablet by mouth 2 (two) times daily as needed for moderate pain (10/18/2022).   losartan 50 MG tablet Commonly known as: COZAAR Take 1 tablet (50 mg total) by mouth daily. Start taking on: October 22, 2022   metroNIDAZOLE 500 MG tablet Commonly known as: Flagyl Take 1 tablet (500 mg total) by mouth 2 (two) times daily with a meal for 7 days. DO NOT CONSUME ALCOHOL WHILE TAKING THIS MEDICATION.   ondansetron 4 MG tablet Commonly known as: ZOFRAN Take 1 tablet (4 mg total) by mouth every 6 (six) hours as needed for nausea.   Systane Balance 0.6 % Soln Generic drug: Propylene Glycol Place 1  drop into both eyes daily as needed (dry eye).   traZODone 100 MG tablet Commonly known as: DESYREL Take 50 mg by mouth at bedtime.   zolpidem 10 MG tablet Commonly known as: AMBIEN Take 10 mg by mouth every evening.        Follow-up Information     [facility] Follow up.   Specialty: Home Health Services Why: Will contact you to schedule home health visits. Contact information: [address] [place] [address] [phone]         [name], MD. Schedule an appointment as soon as possible for a visit in 1 week(s).   Specialty: Family Medicine Why: Hospital Follow Up Contact information: [address] [place] [address] [phone]         Anson Fret, MD. Schedule an appointment as soon as possible for a visit in 3 month(s).   Specialty: Neurology Why: Hospital Follow Up Contact information: [address] [place] [address] [phone]  Allergies  Allergen Reactions   Escitalopram Nausea Only and Other (See Comments)    Nausea and dizziness   Pantoprazole Other (See Comments)    Severe headaches    Naproxen Sodium Anxiety   Allergies as of 10/21/2022       Reactions   Escitalopram Nausea Only, Other (See Comments)   Nausea and dizziness   Pantoprazole Other (See Comments)   Severe headaches   Naproxen Sodium Anxiety        Medication List     TAKE these medications    amLODipine 5 MG tablet Commonly known as: NORVASC Take 1 tablet (5 mg total) by mouth daily.   aspirin EC 81 MG tablet Take 1 tablet (81 mg total) by mouth daily. Swallow whole. Start taking on: October 22, 2022   atorvastatin 40 MG tablet Commonly known as: LIPITOR Take 1 tablet (40 mg total) by mouth every evening.   ciprofloxacin 500 MG tablet Commonly known as: Cipro Take 1 tablet (500 mg total) by mouth 2 (two) times daily for 7 days.   clopidogrel 75 MG tablet Commonly known as:  PLAVIX Take 1 tablet (75 mg total) by mouth daily for 19 days. Start taking on: October 22, 2022   gabapentin 300 MG capsule Commonly known as: NEURONTIN Take 300 mg by mouth 3 (three) times daily as needed (pain).   HYDROcodone-acetaminophen 5-325 MG tablet Commonly known as: NORCO/VICODIN Take 1 tablet by mouth 2 (two) times daily as needed for moderate pain (10/18/2022).   losartan 50 MG tablet Commonly known as: COZAAR Take 1 tablet (50 mg total) by mouth daily. Start taking on: October 22, 2022   metroNIDAZOLE 500 MG tablet Commonly known as: Flagyl Take 1 tablet (500 mg total) by mouth 2 (two) times daily with a meal for 7 days. DO NOT CONSUME ALCOHOL WHILE TAKING THIS MEDICATION.   ondansetron 4 MG tablet Commonly known as: ZOFRAN Take 1 tablet (4 mg total) by mouth every 6 (six) hours as needed for nausea.   Systane Balance 0.6 % Soln Generic drug: Propylene Glycol Place 1 drop into both eyes daily as needed (dry eye).   traZODone 100 MG tablet Commonly known as: DESYREL Take 50 mg by mouth at bedtime.   zolpidem 10 MG tablet Commonly known as: AMBIEN Take 10 mg by mouth every evening.        Procedures/Studies: ECHOCARDIOGRAM COMPLETE  Result Date: 10/19/2022    ECHOCARDIOGRAM REPORT   Patient Name:   KATRENIA ALKINS Date of Exam: 10/19/2022 Medical Rec #:  [id number]      Height:       63.0 in Accession #:    [id number]     Weight:       125.0 lb Date of Birth:  [birth year]      BSA:          1.584 m Patient Age:    76 years       BP:           176/90 mmHg Patient Gender: F              HR:           68 bpm. [facility] Procedure: 2D Echo, Cardiac Doppler, Color Doppler and Intracardiac            Opacification Agent Indications:    Stroke  History:        Patient has prior history of Echocardiogram examinations, most  recent 04/22/2021. CAD, Stroke, Signs/Symptoms:Chest Pain; Risk                 Factors:Hypertension and  Dyslipidemia. Technically difficult                 study due to pectus excavatum deformity.  Sonographer:    Mikki Harbor Referring Phys: [phone] Derry Arbogast L Brinsley Wence IMPRESSIONS  1. Left ventricular ejection fraction, by estimation, is 55 to 60%. The left ventricle has normal function. The left ventricle has no regional wall motion abnormalities. Left ventricular diastolic parameters are consistent with Grade I diastolic dysfunction (impaired relaxation).  2. Right ventricular systolic function is normal. The right ventricular size is normal. There is normal pulmonary artery systolic pressure.  3. The mitral valve is grossly normal. Mild mitral valve regurgitation. No evidence of mitral stenosis.  4. The aortic valve is tricuspid. There is mild calcification of the aortic valve. Aortic valve regurgitation is not visualized. No aortic stenosis is present. Comparison(s): No significant change from prior study. FINDINGS  Left Ventricle: Left ventricular ejection fraction, by estimation, is 55 to 60%. The left ventricle has normal function. The left ventricle has no regional wall motion abnormalities. Definity contrast agent was given IV to delineate the left ventricular  endocardial borders. The left ventricular internal cavity size was normal in size. There is no left ventricular hypertrophy. Left ventricular diastolic parameters are consistent with Grade I diastolic dysfunction (impaired relaxation). Right Ventricle: The right ventricular size is normal. Right vetricular wall thickness was not well visualized. Right ventricular systolic function is normal. There is normal pulmonary artery systolic pressure. The tricuspid regurgitant velocity is 2.10 m/s, and with an assumed right atrial pressure of 3 mmHg, the estimated right ventricular systolic pressure is 20.6 mmHg. Left Atrium: Left atrial size was normal in size. Right Atrium: Right atrial size was normal in size. Pericardium: There is no evidence of  pericardial effusion. Mitral Valve: The mitral valve is grossly normal. Mild mitral valve regurgitation. No evidence of mitral valve stenosis. MV peak gradient, 4.2 mmHg. The mean mitral valve gradient is 2.0 mmHg. Tricuspid Valve: The tricuspid valve is not well visualized. Tricuspid valve regurgitation is trivial. No evidence of tricuspid stenosis. Aortic Valve: The aortic valve is tricuspid. There is mild calcification of the aortic valve. Aortic valve regurgitation is not visualized. No aortic stenosis is present. Aortic valve mean gradient measures 4.0 mmHg. Aortic valve peak gradient measures 7.4 mmHg. Aortic valve area, by VTI measures 2.10 cm. Pulmonic Valve: The pulmonic valve was not well visualized. Pulmonic valve regurgitation is not visualized. No evidence of pulmonic stenosis. Aorta: The aortic root and ascending aorta are structurally normal, with no evidence of dilitation. Venous: The inferior vena cava was not well visualized. IAS/Shunts: The interatrial septum was not assessed.  LEFT VENTRICLE PLAX 2D LVIDd:         3.80 cm   Diastology LVIDs:         1.90 cm   LV e' medial:    6.53 cm/s LV PW:         1.10 cm   LV E/e' medial:  9.6 LV IVS:        1.10 cm   LV e' lateral:   10.00 cm/s LVOT diam:     1.80 cm   LV E/e' lateral: 6.3 LV SV:         63 LV SV Index:   40 LVOT Area:     2.54 cm  RIGHT VENTRICLE  RV Basal diam:  3.20 cm RV Mid diam:    2.30 cm RV S prime:     10.80 cm/s TAPSE (M-mode): 2.2 cm LEFT ATRIUM             Index        RIGHT ATRIUM           Index LA diam:        3.30 cm 2.08 cm/m   RA Area:     12.90 cm LA Vol (A2C):   41.5 ml 26.21 ml/m  RA Volume:   29.10 ml  18.38 ml/m LA Vol (A4C):   26.0 ml 16.42 ml/m LA Biplane Vol: 32.8 ml 20.71 ml/m  AORTIC VALVE                    PULMONIC VALVE AV Area (Vmax):    2.23 cm     PV Vmax:       0.84 m/s AV Area (Vmean):   2.27 cm     PV Peak grad:  2.8 mmHg AV Area (VTI):     2.10 cm AV Vmax:           136.00 cm/s AV Vmean:           87.000 cm/s AV VTI:            0.299 m AV Peak Grad:      7.4 mmHg AV Mean Grad:      4.0 mmHg LVOT Vmax:         119.00 cm/s LVOT Vmean:        77.600 cm/s LVOT VTI:          0.247 m LVOT/AV VTI ratio: 0.83  AORTA Ao Root diam: 3.00 cm Ao Asc diam:  3.20 cm MITRAL VALVE               TRICUSPID VALVE MV Area (PHT): 3.34 cm    TR Peak grad:   17.6 mmHg MV Area VTI:   2.83 cm    TR Vmax:        210.00 cm/s MV Peak grad:  4.2 mmHg MV Mean grad:  2.0 mmHg    SHUNTS MV Vmax:       1.02 m/s    Systemic VTI:  0.25 m MV Vmean:      60.9 cm/s   Systemic Diam: 1.80 cm MV Decel Time: 227 msec MV E velocity: 62.60 cm/s MV A velocity: 95.50 cm/s MV E/A ratio:  0.66 Vishnu Priya Mallipeddi Electronically signed by Winfield Rast Mallipeddi Signature Date/Time: 10/19/2022/4:16:07 PM    Final    CT ANGIO HEAD NECK W WO CM  Result Date: 10/19/2022 CLINICAL DATA:  Acute neuro deficit.  Stroke. EXAM: CT ANGIOGRAPHY HEAD AND NECK TECHNIQUE: Multidetector CT imaging of the head and neck was performed using the standard protocol during bolus administration of intravenous contrast. Multiplanar CT image reconstructions and MIPs were obtained to evaluate the vascular anatomy. Carotid stenosis measurements (when applicable) are obtained utilizing NASCET criteria, using the distal internal carotid diameter as the denominator. RADIATION DOSE REDUCTION: This exam was performed according to the departmental dose-optimization program which includes automated exposure control, adjustment of the mA and/or kV according to patient size and/or use of iterative reconstruction technique. CONTRAST:  60mL OMNIPAQUE IOHEXOL 350 MG/ML SOLN COMPARISON:  MRI head and CT head 10/19/2022 FINDINGS: CTA NECK FINDINGS Aortic arch: Standard branching. Imaged portion shows no evidence of aneurysm or dissection. No significant stenosis of  the major arch vessel origins. Right carotid system: Right carotid widely patent. Minimal atherosclerotic calcification  right carotid bifurcation Left carotid system: Left carotid widely patent. Mild atherosclerotic calcification proximal left internal carotid artery. Vertebral arteries: Both vertebral arteries patent to the skull base without significant stenosis. Skeleton: Cervical spondylosis especially at C4-5, C5-6, C6-7 causing spinal and foraminal stenosis. No acute skeletal abnormality. Other neck: Negative for mass or adenopathy Upper chest: Lung apices clear bilaterally. Review of the MIP images confirms the above findings CTA HEAD FINDINGS Anterior circulation: Internal carotid artery widely patent to the skull base and cavernous segments. Anterior and middle cerebral arteries patent bilaterally without large vessel occlusion. Mild atherosclerotic irregularity in the middle cerebral artery branches bilaterally. Mild atherosclerotic irregularity in the M1 segment bilaterally. Posterior circulation: Both vertebral arteries patent to the basilar. PICA patent bilaterally. Basilar widely patent. Superior cerebellar and posterior cerebral arteries patent bilaterally. Fetal origin right posterior cerebral artery. Mild atherosclerotic irregularity in the P2 and P3 segments bilaterally. Venous sinuses: Normal venous enhancement Anatomic variants: None Review of the MIP images confirms the above findings IMPRESSION: 1. Negative for intracranial large vessel occlusion. 2. Mild atherosclerotic irregularity in the middle cerebral arteries bilaterally. Mild atherosclerotic irregularity in the posterior cerebral artery bilaterally. 3. No significant carotid or vertebral artery stenosis in the neck. Electronically Signed   By: Marlan Palau M.D.   On: 10/19/2022 13:54   MR BRAIN WO CONTRAST  Result Date: 10/19/2022 CLINICAL DATA:  Neuro deficit, acute, stroke suspected EXAM: MRI HEAD WITHOUT CONTRAST MRA HEAD WITHOUT CONTRAST TECHNIQUE: Multiplanar, multi-echo pulse sequences of the brain and surrounding structures were acquired  without intravenous contrast. Angiographic images of the Circle of Willis were acquired using MRA technique without intravenous contrast. COMPARISON:  None Available. FINDINGS: MRI HEAD FINDINGS Brain: Small acute left thalamocapsular infarct. Mild associated edema without significant mass effect. Midline shift. No evidence of acute hemorrhage, mass lesion, midline shift or hydrocephalus. Vascular: See below. Skull and upper cervical spine: Normal marrow signal. Sinuses/Orbits: Clear sinuses.  No acute orbital findings. Other: No mastoid effusions. MRA HEAD FINDINGS Anterior circulation: Bilateral intracranial ICAs, MCAs, and ACAs are patent without proximal high-grade stenosis. Posterior circulation: Bilateral intradural vertebral arteries, basilar artery and bilateral posterior cerebral arteries are patent. At least moderate stenosis of the right P2 PCA appear IMPRESSION: 1. Small acute left thalamocapsular infarct. 2. No emergent large vessel occlusion. 3. At least moderate right P2 PCA stenosis. Electronically Signed   By: Feliberto Harts M.D.   On: 10/19/2022 13:11   MR ANGIO HEAD WO CONTRAST  Result Date: 10/19/2022 CLINICAL DATA:  Neuro deficit, acute, stroke suspected EXAM: MRI HEAD WITHOUT CONTRAST MRA HEAD WITHOUT CONTRAST TECHNIQUE: Multiplanar, multi-echo pulse sequences of the brain and surrounding structures were acquired without intravenous contrast. Angiographic images of the Circle of Willis were acquired using MRA technique without intravenous contrast. COMPARISON:  None Available. FINDINGS: MRI HEAD FINDINGS Brain: Small acute left thalamocapsular infarct. Mild associated edema without significant mass effect. Midline shift. No evidence of acute hemorrhage, mass lesion, midline shift or hydrocephalus. Vascular: See below. Skull and upper cervical spine: Normal marrow signal. Sinuses/Orbits: Clear sinuses.  No acute orbital findings. Other: No mastoid effusions. MRA HEAD FINDINGS Anterior  circulation: Bilateral intracranial ICAs, MCAs, and ACAs are patent without proximal high-grade stenosis. Posterior circulation: Bilateral intradural vertebral arteries, basilar artery and bilateral posterior cerebral arteries are patent. At least moderate stenosis of the right P2 PCA appear IMPRESSION: 1. Small acute left thalamocapsular infarct. 2.  No emergent large vessel occlusion. 3. At least moderate right P2 PCA stenosis. Electronically Signed   By: Feliberto Harts M.D.   On: 10/19/2022 13:11   CT ABDOMEN PELVIS W CONTRAST  Result Date: 10/19/2022 CLINICAL DATA:  Acute, nonlocalized abdominal pain EXAM: CT ABDOMEN AND PELVIS WITH CONTRAST TECHNIQUE: Multidetector CT imaging of the abdomen and pelvis was performed using the standard protocol following bolus administration of intravenous contrast. RADIATION DOSE REDUCTION: This exam was performed according to the departmental dose-optimization program which includes automated exposure control, adjustment of the mA and/or kV according to patient size and/or use of iterative reconstruction technique. CONTRAST:  OMNIPAQUE IOHEXOL 300 MG/ML  SOLN COMPARISON:  08/02/2022 FINDINGS: Lower chest:  Pectus excavatum deforming the right heart. Hepatobiliary: No focal liver abnormality.No evidence of biliary obstruction or stone. Pancreas: Unremarkable. Spleen: Unremarkable. Adrenals/Urinary Tract: Negative adrenals. No hydronephrosis or stone. Tiny renal cystic densities. No indication of pyelonephritis. Unremarkable bladder. Stomach/Bowel: Submucosal low-density thickening involving the sigmoid colon around a thick-walled gaseous collection measuring 9 mm along the inferior and posterior wall of the affected segment. Colonic diverticulosis is extensive. Trace pelvic fluid. No bowel obstruction Vascular/Lymphatic: Scattered atheromatous calcification of the aorta and visceral origins. No mass or adenopathy. Reproductive:Hysterectomy Other: Trace pelvic fluid.   No pneumoperitoneum. Musculoskeletal: Lumbar spine degeneration with notable facet spurring and lumbar lordosis. IMPRESSION: Sigmoid diverticulitis with 9 mm gaseous collection that could be in or adjacent to the offending diverticulum. Electronically Signed   By: Tiburcio Pea M.D.   On: 10/19/2022 10:35   CT HEAD WO CONTRAST ( )  Result Date: 10/19/2022 CLINICAL DATA:  Status change. Right hand arm and numbness 4 days ago. EXAM: CT HEAD WITHOUT CONTRAST TECHNIQUE: Contiguous axial images were obtained from the base of the skull through the vertex without intravenous contrast. RADIATION DOSE REDUCTION: This exam was performed according to the departmental dose-optimization program which includes automated exposure control, adjustment of the mA and/or kV according to patient size and/or use of iterative reconstruction technique. COMPARISON:  CT head 08/01/2022 FINDINGS: Brain: 1 cm hypodensity left thalamus is new since the prior study compatible with infarct which may be recent. Generalized atrophy. Mild patchy white matter hypodensity bilaterally compatible with chronic microvascular ischemia. Negative for acute hemorrhage or mass Vascular: Negative for hyperdense vessel Skull: Negative Sinuses/Orbits: Paranasal sinuses clear. Bilateral cataract extraction Other: None IMPRESSION: 1. 1 cm hypodensity left thalamus compatible with infarct which may be recent. No acute hemorrhage. 2. Atrophy and chronic microvascular ischemia. Electronically Signed   By: Marlan Palau M.D.   On: 10/19/2022 10:34     Subjective: Pt tolerating diet well.  No abdominal pain.   Discharge Exam: Vitals:   10/21/22 0426 10/21/22 0911  BP: 134/79 (!) 157/87  Pulse: 72   Resp: 14   Temp: 98 F (36.7 C)   SpO2: 97%    Vitals:   10/20/22 1426 10/20/22 1959 10/21/22 0426 10/21/22 0911  BP: (!) 140/72 (!) 150/87 134/79 (!) 157/87  Pulse:  61 72   Resp:  18 14   Temp:  98 F (36.7 C) 98 F (36.7 C)   TempSrc:   Oral Oral   SpO2:  96% 97%   Weight:      Height:        General: Pt is alert, awake, not in acute distress Cardiovascular: RRR, S1/S2 +, no rubs, no gallops Respiratory: CTA bilaterally, no wheezing, no rhonchi Abdominal: Soft, NT, ND, bowel sounds + Extremities: no edema, no cyanosis  The results of significant diagnostics from this hospitalization (including imaging, microbiology, ancillary and laboratory) are listed below for reference.     Microbiology: Recent Results (from the past 240 hour(s))  Resp panel by RT-PCR (RSV, Flu A&B, Covid) Anterior Nasal Swab     Status: None   Collection Time: 10/19/22  8:21 AM   Specimen: Anterior Nasal Swab  Result Value Ref Range Status   SARS Coronavirus 2 by RT PCR NEGATIVE NEGATIVE Final    Comment: (NOTE) SARS-CoV-2 target nucleic acids are NOT DETECTED.  The SARS-CoV-2 RNA is generally detectable in upper respiratory specimens during the acute phase of infection. The lowest concentration of SARS-CoV-2 viral copies this assay can detect is 138 copies/mL. A negative result does not preclude SARS-Cov-2 infection and should not be used as the sole basis for treatment or other patient management decisions. A negative result may occur with  improper specimen collection/handling, submission of specimen other than nasopharyngeal swab, presence of viral mutation(s) within the areas targeted by this assay, and inadequate number of viral copies(<138 copies/mL). A negative result must be combined with clinical observations, patient history, and epidemiological information. The expected result is Negative.  Fact Sheet for Patients:  BloggerCourse.com  Fact Sheet for Healthcare Providers:  SeriousBroker.it  This test is no t yet approved or cleared by the [place] FDA and  has been authorized for detection and/or diagnosis of SARS-CoV-2 by FDA under an Emergency Use Authorization  (EUA). This EUA will remain  in effect (meaning this test can be used) for the duration of the COVID-19 declaration under Section 564(b)(1) of the Act, 21 U.S.C.section 360bbb-3(b)(1), unless the authorization is terminated  or revoked sooner.       Influenza A by PCR NEGATIVE NEGATIVE Final   Influenza B by PCR NEGATIVE NEGATIVE Final    Comment: (NOTE) The Xpert Xpress SARS-CoV-2/FLU/RSV plus assay is intended as an aid in the diagnosis of influenza from Nasopharyngeal swab specimens and should not be used as a sole basis for treatment. Nasal washings and aspirates are unacceptable for Xpert Xpress SARS-CoV-2/FLU/RSV testing.  Fact Sheet for Patients: BloggerCourse.com  Fact Sheet for Healthcare Providers: SeriousBroker.it  This test is not yet approved or cleared by the [place] FDA and has been authorized for detection and/or diagnosis of SARS-CoV-2 by FDA under an Emergency Use Authorization (EUA). This EUA will remain in effect (meaning this test can be used) for the duration of the COVID-19 declaration under Section 564(b)(1) of the Act, 21 U.S.C. section 360bbb-3(b)(1), unless the authorization is terminated or revoked.     Resp Syncytial Virus by PCR NEGATIVE NEGATIVE Final    Comment: (NOTE) Fact Sheet for Patients: BloggerCourse.com  Fact Sheet for Healthcare Providers: SeriousBroker.it  This test is not yet approved or cleared by the [place] FDA and has been authorized for detection and/or diagnosis of SARS-CoV-2 by FDA under an Emergency Use Authorization (EUA). This EUA will remain in effect (meaning this test can be used) for the duration of the COVID-19 declaration under Section 564(b)(1) of the Act, 21 U.S.C. section 360bbb-3(b)(1), unless the authorization is terminated or revoked.  Performed at [facility], [address] [place], [place]  [address]   Blood culture (routine x 2)     Status: None (Preliminary result)   Collection Time: 10/19/22 11:02 AM   Specimen: Left Antecubital; Blood  Result Value Ref Range Status   Specimen Description LEFT ANTECUBITAL  Final   Special Requests   Final  BOTTLES DRAWN AEROBIC AND ANAEROBIC Blood Culture adequate volume   Culture   Final    NO GROWTH 2 DAYS Performed at [facility], [address] [place], [place] [address]    Report Status PENDING  Incomplete  Blood culture (routine x 2)     Status: None (Preliminary result)   Collection Time: 10/19/22 11:04 AM   Specimen: BLOOD RIGHT HAND  Result Value Ref Range Status   Specimen Description BLOOD RIGHT HAND  Final   Special Requests   Final    BOTTLES DRAWN AEROBIC AND ANAEROBIC Blood Culture adequate volume   Culture   Final    NO GROWTH 2 DAYS Performed at [facility], [address], [place], [place] [address]    Report Status PENDING  Incomplete     Labs: BNP (last 3 results) Recent Labs    03/04/22 0811  BNP 69.0   Basic Metabolic Panel: Recent Labs  Lab 10/19/22 0739 10/20/22 0409 10/21/22 0412  NA 138 138 139  K 3.6 3.3* 3.7  CL 100 102 106  CO2 GLUCOSE 118* 104* 96  BUN 8 8 5*  CREATININE 0.93 0.84 0.91  CALCIUM 9.7 8.6* 8.6*  MG  --  1.8  --    Liver Function Tests: Recent Labs  Lab 10/19/22 0739 10/20/22 0409  AST 21 13*  ALT 15 11  ALKPHOS 96 68  BILITOT 0.7 0.7  PROT 7.8 5.8*  ALBUMIN 4.2 3.0*   Recent Labs  Lab 10/19/22 0739  LIPASE 29   No results for input(s): "AMMONIA" in the last 168 hours. CBC: Recent Labs  Lab 10/19/22 0739 10/20/22 0409  WBC 13.3* 10.4  NEUTROABS 11.3* 8.8*  HGB 14.2 11.2*  HCT 43.4 34.6*  MCV 89.7 91.1  PLT 274 197   Cardiac Enzymes: No results for input(s): "CKTOTAL", "CKMB", "CKMBINDEX", "TROPONINI" in the last 168 hours. BNP: Invalid input(s): "POCBNP" CBG: No results for input(s): "GLUCAP" in the last 168 hours. D-Dimer No  results for input(s): "DDIMER" in the last 72 hours. Hgb A1c Recent Labs    10/19/22 0821  HGBA1C 5.6   Lipid Profile Recent Labs    10/19/22 0821  CHOL 276*  HDL 98  LDLCALC 162*  TRIG 81  CHOLHDL 2.8   Thyroid function studies No results for input(s): "TSH", "T4TOTAL", "T3FREE", "THYROIDAB" in the last 72 hours.  Invalid input(s): "FREET3" Anemia work up No results for input(s): "VITAMINB12", "FOLATE", "FERRITIN", "TIBC", "IRON", "RETICCTPCT" in the last 72 hours. Urinalysis    Component Value Date/Time   COLORURINE STRAW (A) 10/19/2022 0827   APPEARANCEUR CLEAR 10/19/2022 0827   LABSPEC 1.002 (L) 10/19/2022 0827   PHURINE 6.0 10/19/2022 0827   GLUCOSEU NEGATIVE 10/19/2022 0827   HGBUR NEGATIVE 10/19/2022 0827   BILIRUBINUR NEGATIVE 10/19/2022 0827   KETONESUR NEGATIVE 10/19/2022 0827   PROTEINUR NEGATIVE 10/19/2022 0827   UROBILINOGEN 0.2 01/29/2012 0938   NITRITE NEGATIVE 10/19/2022 0827   LEUKOCYTESUR NEGATIVE 10/19/2022 0827   Sepsis Labs Recent Labs  Lab 10/19/22 0739 10/20/22 0409  WBC 13.3* 10.4   Microbiology Recent Results (from the past 240 hour(s))  Resp panel by RT-PCR (RSV, Flu A&B, Covid) Anterior Nasal Swab     Status: None   Collection Time: 10/19/22  8:21 AM   Specimen: Anterior Nasal Swab  Result Value Ref Range Status   SARS Coronavirus 2 by RT PCR NEGATIVE NEGATIVE Final    Comment: (NOTE) SARS-CoV-2 target nucleic acids are NOT DETECTED.  The SARS-CoV-2 RNA is generally detectable in upper respiratory specimens during the acute phase of infection. The lowest concentration of SARS-CoV-2 viral copies this assay can detect is 138 copies/mL. A negative result does not preclude SARS-Cov-2 infection and should not be used as the sole basis for treatment or other patient management decisions. A negative result may occur with  improper specimen collection/handling, submission of specimen other than nasopharyngeal swab, presence of viral  mutation(s) within the areas targeted by this assay, and inadequate number of viral copies(<138 copies/mL). A negative result must be combined with clinical observations, patient history, and epidemiological information. The expected result is Negative.  Fact Sheet for Patients:  BloggerCourse.comhttps://www.fda.gov/media/152166/download  Fact Sheet for Healthcare Providers:  SeriousBroker.ithttps://www.fda.gov/media/152162/download  This test is no t yet approved or cleared by the [place] FDA and  has been authorized for detection and/or diagnosis of SARS-CoV-2 by FDA under an Emergency Use Authorization (EUA). This EUA will remain  in effect (meaning this test can be used) for the duration of the COVID-19 declaration under Section 564(b)(1) of the Act, 21 U.S.C.section 360bbb-3(b)(1), unless the authorization is terminated  or revoked sooner.       Influenza A by PCR NEGATIVE NEGATIVE Final   Influenza B by PCR NEGATIVE NEGATIVE Final    Comment: (NOTE) The Xpert Xpress SARS-CoV-2/FLU/RSV plus assay is intended as an aid in the diagnosis of influenza from Nasopharyngeal swab specimens and should not be used as a sole basis for treatment. Nasal washings and aspirates are unacceptable for Xpert Xpress SARS-CoV-2/FLU/RSV testing.  Fact Sheet for Patients: BloggerCourse.comhttps://www.fda.gov/media/152166/download  Fact Sheet for Healthcare Providers: SeriousBroker.ithttps://www.fda.gov/media/152162/download  This test is not yet approved or cleared by the [place] FDA and has been authorized for detection and/or diagnosis of SARS-CoV-2 by FDA under an Emergency Use Authorization (EUA). This EUA will remain in effect (meaning this test can be used) for the duration of the COVID-19 declaration under Section 564(b)(1) of the Act, 21 U.S.C. section 360bbb-3(b)(1), unless the authorization is terminated or revoked.     Resp Syncytial Virus by PCR NEGATIVE NEGATIVE Final    Comment: (NOTE) Fact Sheet for  Patients: BloggerCourse.comhttps://www.fda.gov/media/152166/download  Fact Sheet for Healthcare Providers: SeriousBroker.ithttps://www.fda.gov/media/152162/download  This test is not yet approved or cleared by the [place] FDA and has been authorized for detection and/or diagnosis of SARS-CoV-2 by FDA under an Emergency Use Authorization (EUA). This EUA will remain in effect (meaning this test can be used) for the duration of the COVID-19 declaration under Section 564(b)(1) of the Act, 21 U.S.C. section 360bbb-3(b)(1), unless the authorization is terminated or revoked.  Performed at [facility], [address] [place], [place] [address]   Blood culture (routine x 2)     Status: None (Preliminary result)   Collection Time: 10/19/22 11:02 AM   Specimen: Left Antecubital; Blood  Result Value Ref Range Status   Specimen Description LEFT ANTECUBITAL  Final   Special Requests   Final    BOTTLES DRAWN AEROBIC AND ANAEROBIC Blood Culture adequate volume   Culture   Final    NO GROWTH 2 DAYS Performed at [facility], [address] [place], [place] [address]    Report Status PENDING  Incomplete  Blood culture (routine x 2)     Status: None (Preliminary result)   Collection Time: 10/19/22 11:04 AM   Specimen: BLOOD RIGHT HAND  Result Value Ref Range Status   Specimen Description BLOOD RIGHT HAND  Final   Special Requests   Final  BOTTLES DRAWN AEROBIC AND ANAEROBIC Blood Culture adequate volume   Culture   Final    NO GROWTH 2 DAYS Performed at [facility], [address], [place], [place] [address]    Report Status PENDING  Incomplete    Time coordinating discharge:  43 mins   SIGNED:  Standley Dakins, MD  [facility] 10/21/2022, 11:09 AM How to contact the [facility] Attending or Consulting provider 7A - 7P or covering provider during after hours 7P -7A, for this patient?  Check the care team in [facility] and look for a) attending/consulting [facility] provider listed and b) the [facility] [place] team listed Log into  [email or web address] and use [facility]'s universal password to access. If you do not have the password, please contact the hospital operator. Locate the [facility] provider you are looking for under [facility] and page to a number that you can be directly reached. If you still have difficulty reaching the provider, please page the [facility] (Director on Call) for the Hospitalists listed on amion for assistance.

## 2022-10-21 NOTE — Discharge Instructions (Addendum)
Please take aspirin and plavix together daily thru 11/10/22 then stop plavix but continue taking aspirin daily  Soft foods diet recommended for next 5 days    IMPORTANT INFORMATION: PAY CLOSE ATTENTION   PHYSICIAN DISCHARGE INSTRUCTIONS  Follow with Primary care provider  [name], MD  and other consultants as instructed by your Hospitalist Physician  SEEK MEDICAL CARE OR RETURN TO EMERGENCY ROOM IF SYMPTOMS COME BACK, WORSEN OR NEW PROBLEM DEVELOPS   Please note: You were cared for by a hospitalist during your hospital stay. Every effort will be made to forward records to your primary care provider.  You can request that your primary care provider send for your hospital records if they have not received them.  Once you are discharged, your primary care physician will handle any further medical issues. Please note that NO REFILLS for any discharge medications will be authorized once you are discharged, as it is imperative that you return to your primary care physician (or establish a relationship with a primary care physician if you do not have one) for your post hospital discharge needs so that they can reassess your need for medications and monitor your lab values.  Please get a complete blood count and chemistry panel checked by your Primary MD at your next visit, and again as instructed by your Primary MD.  Get Medicines reviewed and adjusted: Please take all your medications with you for your next visit with your Primary MD  Laboratory/radiological data: Please request your Primary MD to go over all hospital tests and procedure/radiological results at the follow up, please ask your primary care provider to get all Hospital records sent to his/her office.  In some cases, they will be blood work, cultures and biopsy results pending at the time of your discharge. Please request that your primary care provider follow up on these results.  If you are diabetic, please bring your blood  sugar readings with you to your follow up appointment with primary care.    Please call and make your follow up appointments as soon as possible.    Also Note the following: If you experience worsening of your admission symptoms, develop shortness of breath, life threatening emergency, suicidal or homicidal thoughts you must seek medical attention immediately by calling 911 or calling your MD immediately  if symptoms less severe.  You must read complete instructions/literature along with all the possible adverse reactions/side effects for all the Medicines you take and that have been prescribed to you. Take any new Medicines after you have completely understood and accpet all the possible adverse reactions/side effects.   Do not drive when taking Pain medications or sleeping medications (Benzodiazepines)  Do not take more than prescribed Pain, Sleep and Anxiety Medications. It is not advisable to combine anxiety,sleep and pain medications without talking with your primary care practitioner  Special Instructions: If you have smoked or chewed Tobacco  in the last 2 yrs please stop smoking, stop any regular Alcohol  and or any Recreational drug use.  Wear Seat belts while driving.  Do not drive if taking any narcotic, mind altering or controlled substances or recreational drugs or alcohol.

## 2022-10-21 NOTE — Progress Notes (Signed)
Physical Therapy Treatment Patient Details Name: Kayla Terry MRN: [id number] DOB: 10/20/1946 Today's Date: 10/21/2022   History of Present Illness Kayla Terry is a 76 year old female with history of GERD, hypertension, hyperlipidemia, Mobitz type II second-degree AV block, hiatal hernia, fatty liver, coronary artery disease, deficiency and seasonal allergies presented to the ED complaining of right arm and hand numbness that started approximately 4 to 5 days ago.  She reports that she initially was unable to hold onto things or grabs things and was dropping objects frequently.  She also started noticing numbness to the left hand and left upper thigh.  She did not seek medical care at that time.  She presents today complaining of abdominal cramping in the left upper quadrant area.  She reports that the pain seems to be worse with urination.  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  It was noted to be 9 mm in size.  She was seen by Dr. Lovell Sheehan with general surgery who recommended conservative management with IV antibiotics and bowel rest.  Will also need to undergo a colonoscopy outpatient when medically stabilized.  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  She is being admitted for further management, IV antibiotics and workup for possible acute CVA.    PT Comments    Patient does not require assist for bed mobility and demonstrates good sitting balance and sitting tolerance at EOB. Patient able to transfer to standing without AD but is slightly unsteady upon initial standing. Patient able to ambulate increased distance today without AD with intermittent unsteadiness without loss of balance. Patient returned to room at end of session. Patient will benefit from continued skilled physical therapy in hospital and recommended venue below to increase  strength, balance, endurance for safe ADLs and gait.    Recommendations for follow up therapy are one component of a multi-disciplinary discharge planning process, led by the attending physician.  Recommendations may be updated based on patient status, additional functional criteria and insurance authorization.  Follow Up Recommendations  Home health PT     Assistance Recommended at Discharge Set up Supervision/Assistance  Patient can return home with the following A little help with walking and/or transfers;A little help with bathing/dressing/bathroom;Help with stairs or ramp for entrance;Assistance with cooking/housework   Equipment Recommendations  None recommended by PT    Recommendations for Other Services       Precautions / Restrictions Precautions Precautions: Fall Restrictions Weight Bearing Restrictions: No     Mobility  Bed Mobility Overal bed mobility: Modified Independent                  Transfers Overall transfer level: Needs assistance Equipment used: None Transfers: Sit to/from Stand, Bed to chair/wheelchair/BSC Sit to Stand: Supervision   Step pivot transfers: Supervision       General transfer comment: slightly unsteady up standing initially    Ambulation/Gait Ambulation/Gait assistance: Supervision, Min guard Gait Distance (Feet): 200 Feet Assistive device: None Gait Pattern/deviations: Decreased step length - left, Decreased stance time - right, Decreased stride length Gait velocity: decreased     General Gait Details: slightly labored cadence with intermittent unsteadiness, no loss of balance, without AD   Stairs             Wheelchair Mobility    Modified Rankin (Stroke Patients Only)  Balance Overall balance assessment: Needs assistance Sitting-balance support: Feet supported, No upper extremity supported Sitting balance-Leahy Scale: Good Sitting balance - Comments: seated at EOB   Standing balance support:  During functional activity, No upper extremity supported Standing balance-Leahy Scale: Fair Standing balance comment: without AD                            Cognition Arousal/Alertness: Awake/alert Behavior During Therapy: WFL for tasks assessed/performed Overall Cognitive Status: Within Functional Limits for tasks assessed                                          Exercises      General Comments        Pertinent Vitals/Pain Pain Assessment Pain Assessment: No/denies pain    Home Living Family/patient expects to be discharged to:: Private residence Living Arrangements: Alone Available Help at Discharge: Family;Available PRN/intermittently Type of Home: House Home Access: Ramped entrance       Home Layout: One level Home Equipment: Cane - single Librarian, academic (2 wheels);Shower seat - built in;Grab bars - tub/shower Additional Comments: grab bars both tub/shower and walk in shower, tub/shower has seat built in tub (history per PT note)    Prior Function            PT Goals (current goals can now be found in the care plan section) Acute Rehab PT Goals Patient Stated Goal: return home with family to assist PT Goal Formulation: With patient Time For Goal Achievement: 10/24/22 Potential to Achieve Goals: Good Progress towards PT goals: Progressing toward goals    Frequency    Min 3X/week      PT Plan Current plan remains appropriate    Co-evaluation PT/OT/SLP Co-Evaluation/Treatment: Yes Reason for Co-Treatment: To address functional/ADL transfers PT goals addressed during session: Mobility/safety with mobility;Balance;Proper use of DME;Strengthening/ROM OT goals addressed during session: ADL's and self-care      AM-PAC PT "6 Clicks" Mobility   Outcome Measure  Help needed turning from your back to your side while in a flat bed without using bedrails?: None Help needed moving from lying on your back to sitting on the  side of a flat bed without using bedrails?: None Help needed moving to and from a bed to a chair (including a wheelchair)?: A Little Help needed standing up from a chair using your arms (e.g., wheelchair or bedside chair)?: A Little Help needed to walk in hospital room?: A Little Help needed climbing 3-5 steps with a railing? : A Little 6 Click Score: 20    End of Session Equipment Utilized During Treatment: Gait belt Activity Tolerance: Patient tolerated treatment well;Patient limited by fatigue Patient left: in chair;with call bell/phone within reach Nurse Communication: Mobility status PT Visit Diagnosis: Unsteadiness on feet (R26.81);Other abnormalities of gait and mobility (R26.89);Muscle weakness (generalized) (M62.81)     Time: 4917-9150 PT Time Calculation (min) (ACUTE ONLY): 19 min  Charges:  $Therapeutic Activity: 8-22 mins                     9:41 AM, 10/21/22 Wyman Songster PT, DPT Physical Therapist at [facility]

## 2022-10-21 NOTE — Evaluation (Signed)
Occupational Therapy Evaluation Patient Details Name: Jennife Zaucha MRN: [id number] DOB: 1946-04-15 Today's Date: 10/21/2022   History of Present Illness Nayda Riesen is a 76 year old female with history of GERD, hypertension, hyperlipidemia, Mobitz type II second-degree AV block, hiatal hernia, fatty liver, coronary artery disease, deficiency and seasonal allergies presented to the ED complaining of right arm and hand numbness that started approximately 4 to 5 days ago.  She reports that she initially was unable to hold onto things or grabs things and was dropping objects frequently.  She also started noticing numbness to the left hand and left upper thigh.  She did not seek medical care at that time.  She presents today complaining of abdominal cramping in the left upper quadrant area.  She reports that the pain seems to be worse with urination.  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  It was noted to be 9 mm in size.  She was seen by Dr. Lovell Sheehan with general surgery who recommended conservative management with IV antibiotics and bowel rest.  Will also need to undergo a colonoscopy outpatient when medically stabilized.  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  She is being admitted for further management, IV antibiotics and workup for possible acute CVA.   Clinical Impression   Pt agreeable to OT and PT co-evaluation/treatment. Pt demonstrates mild unsteadiness on feat but without need for physical assist. Pt does not demonstrate any significant asymmetries in B UE strength or coordination this date. Pt reports increase in blurry vision recently, but this does not seem to impact function. Pt is recommended for home health primarily to ensure deficits have continued to improve and possibly for a home safety assessment. Pt is not recommended for  further acute OT services and will be discharged to care of nursing staff for remaining length of stay.       Recommendations for follow up therapy are one component of a multi-disciplinary discharge planning process, led by the attending physician.  Recommendations may be updated based on patient status, additional functional criteria and insurance authorization.   Follow Up Recommendations  Home health OT     Assistance Recommended at Discharge Set up Supervision/Assistance  Patient can return home with the following A little help with walking and/or transfers;Help with stairs or ramp for entrance;Assistance with cooking/housework    Functional Status Assessment  Patient has had a recent decline in their functional status and demonstrates the ability to make significant improvements in function in a reasonable and predictable amount of time.  Equipment Recommendations  None recommended by OT    Recommendations for Other Services Other (comment) (Optometrist evaluation if blurry vision continues.)     Precautions / Restrictions Precautions Precautions: Fall Restrictions Weight Bearing Restrictions: No      Mobility Bed Mobility Overal bed mobility: Independent                  Transfers Overall transfer level: Needs assistance   Transfers: Sit to/from Stand, Bed to chair/wheelchair/BSC Sit to Stand: Supervision     Step pivot transfers: Supervision            Balance Overall balance assessment: Mild deficits observed, not formally tested  ADL either performed or assessed with clinical judgement   ADL Overall ADL's : Needs assistance/impaired     Grooming: Supervision/safety;Standing   Upper Body Bathing: Independent;Sitting   Lower Body Bathing: Modified independent;Sitting/lateral leans   Upper Body Dressing : Independent;Sitting   Lower Body Dressing: Modified independent;Sitting/lateral  leans   Toilet Transfer: Supervision/safety;Ambulation Toilet Transfer Details (indicate cue type and reason): Simulated via EOB to chair transfer without AD.         Functional mobility during ADLs: Supervision/safety General ADL Comments: Able to ambulate in the hall with supervision.     Vision Baseline Vision/History: 1 Wears glasses (Hsitory of cataracts removal) Ability to See in Adequate Light: 1 Impaired Patient Visual Report: Blurring of vision (Pt reports that reading items near to her is more blurry. Unclear how recent this change has been.) Vision Assessment?: Yes Tracking/Visual Pursuits: Able to track stimulus in all quads without difficulty Visual Fields: No apparent deficits Additional Comments: Pt reports increase in blurry vision for near acuity.                Pertinent Vitals/Pain Pain Assessment Pain Assessment: No/denies pain (Does report feeling pressure behind eyes.)     Hand Dominance Right   Extremity/Trunk Assessment Upper Extremity Assessment Upper Extremity Assessment: Generalized weakness (No significant asymmetry noted in strength.)   Lower Extremity Assessment Lower Extremity Assessment: Defer to PT evaluation   Cervical / Trunk Assessment Cervical / Trunk Assessment: Normal   Communication Communication Communication: No difficulties   Cognition Arousal/Alertness: Awake/alert Behavior During Therapy: WFL for tasks assessed/performed Overall Cognitive Status: Within Functional Limits for tasks assessed                                                        Home Living Family/patient expects to be discharged to:: Private residence Living Arrangements: Alone Available Help at Discharge: Family;Available PRN/intermittently Type of Home: House Home Access: Ramped entrance     Home Layout: One level     Bathroom Shower/Tub: Tub/shower unit;Walk-in shower   Bathroom Toilet: Standard Bathroom  Accessibility: Yes   Home Equipment: Cane - single Librarian, academic (2 wheels);Shower seat - built in;Grab bars - tub/shower   Additional Comments: grab bars both tub/shower and walk in shower, tub/shower has seat built in tub (history per PT note)      Prior Functioning/Environment Prior Level of Function : Independent/Modified Independent;Needs assist             Mobility Comments: household and short distanced community ambulator without AD ADLs Comments: Independent household, assisted for grocery shopping and driving.                      OT Goals(Current goals can be found in the care plan section) Acute Rehab OT Goals Patient Stated Goal: return home  OT Frequency:      Co-evaluation PT/OT/SLP Co-Evaluation/Treatment: Yes Reason for Co-Treatment: To address functional/ADL transfers   OT goals addressed during session: ADL's and self-care                       End of Session Equipment Utilized During Treatment: Gait belt  Activity Tolerance: Patient tolerated treatment well Patient left: in chair;with call bell/phone within reach  OT Visit Diagnosis: Unsteadiness on feet (R26.81);Other abnormalities of gait  and mobility (R26.89);Muscle weakness (generalized) (M62.81);Hemiplegia and hemiparesis Hemiplegia - Right/Left: Right Hemiplegia - dominant/non-dominant: Dominant Hemiplegia - caused by: Cerebral infarction                Time: 4888-9169 OT Time Calculation (min): 22 min Charges:  OT General Charges $OT Visit: 1 Visit OT Evaluation $OT Eval Low Complexity: 1 Low  Burlie Cajamarca OT, MOT  Danie Chandler 10/21/2022, 9:32 AM

## 2022-10-24 LAB — CULTURE, BLOOD (ROUTINE X 2)
Culture: NO GROWTH
Culture: NO GROWTH
Special Requests: ADEQUATE
Special Requests: ADEQUATE

## 2022-10-25 LAB — CBC WITH DIFFERENTIAL/PLATELET
Abs Immature Granulocytes: 0.04 10*3/uL (ref 0.00–0.07)
Basophils Absolute: 0 10*3/uL (ref 0.0–0.1)
Basophils Relative: 0 %
Eosinophils Absolute: 0.1 10*3/uL (ref 0.0–0.5)
Eosinophils Relative: 1 %
HCT: 34.6 % — ABNORMAL LOW (ref 36.0–46.0)
Hemoglobin: 11.2 g/dL — ABNORMAL LOW (ref 12.0–15.0)
Immature Granulocytes: 0 %
Lymphocytes Relative: 6 %
Lymphs Abs: 0.6 10*3/uL — ABNORMAL LOW (ref 0.7–4.0)
MCH: 29.5 pg (ref 26.0–34.0)
MCHC: 32.4 g/dL (ref 30.0–36.0)
MCV: 91.1 fL (ref 80.0–100.0)
Monocytes Absolute: 0.8 10*3/uL (ref 0.1–1.0)
Monocytes Relative: 7 %
Neutro Abs: 8.8 10*3/uL — ABNORMAL HIGH (ref 1.7–7.7)
Neutrophils Relative %: 86 %
Platelets: 197 10*3/uL (ref 150–400)
RBC: 3.8 MIL/uL — ABNORMAL LOW (ref 3.87–5.11)
RDW: 12.8 % (ref 11.5–15.5)
WBC: 10.4 10*3/uL (ref 4.0–10.5)
nRBC: 0 % (ref 0.0–0.2)

## 2022-11-08 ENCOUNTER — Emergency Department (HOSPITAL_COMMUNITY)
Admission: EM | Admit: 2022-11-08 | Discharge: 2022-11-08 | Disposition: A | Discharge status: Home / Self Care | Payer: Medicare Other | Attending: Student | Admitting: Student

## 2022-11-08 ENCOUNTER — Other Ambulatory Visit: Payer: Self-pay

## 2022-11-08 ENCOUNTER — Emergency Department (HOSPITAL_COMMUNITY): Payer: Medicare Other

## 2022-11-08 ENCOUNTER — Encounter (HOSPITAL_COMMUNITY): Payer: Self-pay

## 2022-11-08 DIAGNOSIS — N952 Postmenopausal atrophic vaginitis: Secondary | ICD-10-CM | POA: Insufficient documentation

## 2022-11-08 DIAGNOSIS — Z7982 Long term (current) use of aspirin: Secondary | ICD-10-CM | POA: Diagnosis not present

## 2022-11-08 DIAGNOSIS — Z7902 Long term (current) use of antithrombotics/antiplatelets: Secondary | ICD-10-CM | POA: Insufficient documentation

## 2022-11-08 DIAGNOSIS — R109 Unspecified abdominal pain: Secondary | ICD-10-CM | POA: Diagnosis present

## 2022-11-08 DIAGNOSIS — K5792 Diverticulitis of intestine, part unspecified, without perforation or abscess without bleeding: Secondary | ICD-10-CM | POA: Insufficient documentation

## 2022-11-08 HISTORY — DX: Diverticulitis of intestine, part unspecified, without perforation or abscess without bleeding: K57.92

## 2022-11-08 HISTORY — DX: Cerebral infarction, unspecified: I63.9

## 2022-11-08 LAB — LIPASE, BLOOD: Lipase: 27 U/L (ref 11–51)

## 2022-11-08 LAB — COMPREHENSIVE METABOLIC PANEL
ALT: 11 U/L (ref 0–44)
AST: 17 U/L (ref 15–41)
Albumin: 4.2 g/dL (ref 3.5–5.0)
Alkaline Phosphatase: 77 U/L (ref 38–126)
Anion gap: 11 (ref 5–15)
BUN: 7 mg/dL — ABNORMAL LOW (ref 8–23)
CO2: 28 mmol/L (ref 22–32)
Calcium: 9.3 mg/dL (ref 8.9–10.3)
Chloride: 99 mmol/L (ref 98–111)
Creatinine, Ser: 0.86 mg/dL (ref 0.44–1.00)
GFR, Estimated: >60 mL/min (ref >60)
Glucose, Bld: 107 mg/dL — ABNORMAL HIGH (ref 70–99)
Potassium: 4 mmol/L (ref 3.5–5.1)
Sodium: 138 mmol/L (ref 135–145)
Total Bilirubin: 0.5 mg/dL (ref 0.3–1.2)
Total Protein: 6.9 g/dL (ref 6.5–8.1)

## 2022-11-08 LAB — CBC
HCT: 40.7 % (ref 36.0–46.0)
Hemoglobin: 13 g/dL (ref 12.0–15.0)
MCH: 29 pg (ref 26.0–34.0)
MCHC: 31.9 g/dL (ref 30.0–36.0)
MCV: 90.8 fL (ref 80.0–100.0)
Platelets: 293 10*3/uL (ref 150–400)
RBC: 4.48 MIL/uL (ref 3.87–5.11)
RDW: 12.9 % (ref 11.5–15.5)
WBC: 7.7 10*3/uL (ref 4.0–10.5)
nRBC: 0 % (ref 0.0–0.2)

## 2022-11-08 LAB — URINALYSIS, ROUTINE W REFLEX MICROSCOPIC
Bilirubin Urine: NEGATIVE
Glucose, UA: NEGATIVE mg/dL
Hgb urine dipstick: NEGATIVE
Ketones, ur: NEGATIVE mg/dL
Leukocytes,Ua: NEGATIVE
Nitrite: NEGATIVE
Protein, ur: NEGATIVE mg/dL
Specific Gravity, Urine: 1.004 — ABNORMAL LOW (ref 1.005–1.030)
pH: 6 (ref 5.0–8.0)

## 2022-11-08 MED ORDER — IOHEXOL 300 MG/ML  SOLN
100.0000 mL | Freq: Once | INTRAMUSCULAR | Status: AC | PRN
  Administered 2022-11-08: 100 mL via INTRAVENOUS

## 2022-11-08 MED ORDER — AMOXICILLIN-POT CLAVULANATE 875-125 MG PO TABS: 1.0000 | ORAL_TABLET | Freq: Two times a day (BID) | ORAL | 0 refills | Status: DC

## 2022-11-08 MED ORDER — AMOXICILLIN-POT CLAVULANATE 875-125 MG PO TABS
1.0000 | ORAL_TABLET | Freq: Once | ORAL | Status: AC
  Administered 2022-11-08: 1 via ORAL
  Filled 2022-11-08: qty 1

## 2022-11-08 MED ORDER — ESTROGENS CONJUGATED 0.625 MG/GM VA CREA: 1.0000 | TOPICAL_CREAM | Freq: Every day | VAGINAL | 0 refills | Status: AC

## 2022-11-08 NOTE — ED Provider Triage Note (Signed)
Emergency Medicine Provider Triage Evaluation Note  Kayla Terry , a 77 y.o. female  was evaluated in triage.  Pt complains of abdominal pain since recent hospitalization for diverticulitis, got better initially but worse in the past 2 days.  Review of Systems  Positive: Abdominal pain, nausea, chills Negative: Fever, vomiting  Physical Exam  BP (!) 159/80 (BP Location: Right Arm)   Pulse 85   Temp 97.9 F (36.6 C) (Oral)   Resp 14   Ht 5\' 3"  (1.6 m)   Wt 56.7 kg   SpO2 97%   BMI 22.14 kg/m  Gen:   Awake, no distress  Resp:  Normal effort  MSK:   Moves extremities without difficulty  Other:  Mild diffuse lower abdominal tenderness with no rebound guarding or rigidity  Medical Decision Making  Medically screening exam initiated at 9:59 AM.  Appropriate orders placed.  Kayla Terry was informed that the remainder of the evaluation will be completed by another provider, this initial triage assessment does not replace that evaluation, and the importance of remaining in the ED until their evaluation is complete.     Sherrye Payor A, PA-C 11/08/22 1000

## 2022-11-08 NOTE — Discharge Instructions (Addendum)
Seen today for abdominal pain you were found to have diverticulitis again.  At this time you do not need to stay in the hospital.  You are given a prescription for antibiotics.  Follow-up with your primary care doctor in the next couple of days for a repeat examination.  Come back to the ER if you have fever, worsening pain or vomiting.  In addition you were given topical medication for your vaginal discomfort.  Follow-up with your primary care doctor for this as well.

## 2022-11-08 NOTE — ED Triage Notes (Signed)
Pt c/o abd cramping is worse from her diverticulitis on 12-20. Pt c/o dizziness, ride side numbness arms, hands and legs and left side shoulder numbness. Pt also dx with stroke right side deficient on 12-20 per pt. Pt also c/o chills.

## 2022-11-08 NOTE — ED Provider Notes (Signed)
[facility] EMERGENCY DEPARTMENT Provider Note   CSN: [id number] Arrival date & time: 11/08/22  8295     History  Chief Complaint  Patient presents with   Abdominal Pain   Dizziness    Kayla Terry is a 77 y.o. female.  She was admitted to the hospital on December 20 for diverticulitis, states she went home finish her course of antibiotics and was feeling better but started having similar pain a couple days ago.  She states when she has a bowel movement it feels like she does not completely evacuate her bowels.  She denies any significant constipation, no hematochezia or melena.  No fevers or chills, no dysuria.  She states she does have some urgency with urination.   Abdominal Pain Dizziness      Home Medications Prior to Admission medications   Medication Sig Start Date End Date Taking? Authorizing Provider  amLODipine (NORVASC) 5 MG tablet Take 1 tablet (5 mg total) by mouth daily. 10/21/22 12/20/22 Yes Johnson, Clanford L, MD  amoxicillin-clavulanate (AUGMENTIN) 875-125 MG tablet Take 1 tablet by mouth every 12 (twelve) hours. 11/08/22  Yes Marlaya Turck A, PA-C  aspirin EC 81 MG tablet Take 1 tablet (81 mg total) by mouth daily. Swallow whole. 10/22/22  Yes Johnson, Clanford L, MD  BEE POLLEN PO Take 2 capsules by mouth daily.   Yes [provider]  clopidogrel (PLAVIX) 75 MG tablet Take 1 tablet (75 mg total) by mouth daily for 19 days. 10/22/22 11/10/22 Yes Johnson, Clanford L, MD  conjugated estrogens (PREMARIN) vaginal cream Place 1 Applicatorful vaginally daily for 14 days. 11/08/22 11/22/22 Yes Emilygrace Grothe A, PA-C  famotidine (PEPCID) 20 MG tablet Take 20 mg by mouth daily as needed for heartburn or indigestion.   Yes [provider]  gabapentin (NEURONTIN) 300 MG capsule Take 300 mg by mouth 3 (three) times daily as needed (pain). 07/18/22  Yes [provider]  HYDROcodone-acetaminophen (NORCO/VICODIN) 5-325 MG tablet Take 1 tablet by mouth 2  (two) times daily as needed for moderate pain (10/18/2022).   Yes [provider]  losartan (COZAAR) 50 MG tablet Take 1 tablet (50 mg total) by mouth daily. 10/22/22  Yes Johnson, Clanford L, MD  Propylene Glycol (SYSTANE BALANCE) 0.6 % SOLN Place 1 drop into both eyes daily as needed (dry eye).   Yes [provider]  traZODone (DESYREL) 100 MG tablet Take 100 mg by mouth at bedtime. 03/02/22  Yes [provider]  zolpidem (AMBIEN) 10 MG tablet Take 10 mg by mouth every evening.   Yes [provider]  atorvastatin (LIPITOR) 40 MG tablet Take 1 tablet (40 mg total) by mouth every evening. Patient not taking: Reported on 11/08/2022 10/21/22   Cleora Fleet, MD  fluconazole (DIFLUCAN) 150 MG tablet Take 150 mg by mouth every 3 (three) days. Patient not taking: Reported on 11/08/2022 11/01/22   [provider]  ondansetron (ZOFRAN) 4 MG tablet Take 1 tablet (4 mg total) by mouth every 6 (six) hours as needed for nausea. Patient not taking: Reported on 11/08/2022 10/21/22   Cleora Fleet, MD      Allergies    Escitalopram, Pantoprazole, and Naproxen sodium    Review of Systems   Review of Systems  Gastrointestinal:  Positive for abdominal pain.  Neurological:  Positive for dizziness.    Physical Exam Updated Vital Signs BP (!) 157/92   Pulse 77   Temp 97.9 F (36.6 C) (Oral)   Resp 19  Ht 5\' 3"  (1.6 m)   Wt 56.7 kg   SpO2 99%   BMI 22.14 kg/m  Physical Exam Vitals and nursing note reviewed.  Constitutional:      General: She is not in acute distress.    Appearance: She is well-developed.  HENT:     Head: Normocephalic and atraumatic.  Eyes:     Conjunctiva/sclera: Conjunctivae normal.  Cardiovascular:     Rate and Rhythm: Normal rate and regular rhythm.     Heart sounds: No murmur heard. Pulmonary:     Effort: Pulmonary effort is normal. No respiratory distress.     Breath sounds: Normal breath sounds.  Abdominal:      Palpations: Abdomen is soft.     Tenderness: There is abdominal tenderness in the suprapubic area. There is no right CVA tenderness or left CVA tenderness.  Genitourinary:    Comments: The patient has mild excoriations noted to her labia majora with some thinning skin consistent with atrophic vaginitis Musculoskeletal:        General: No swelling.     Cervical back: Neck supple.  Skin:    General: Skin is warm and dry.     Capillary Refill: Capillary refill takes less than 2 seconds.  Neurological:     Mental Status: She is alert.  Psychiatric:        Mood and Affect: Mood normal.     ED Results / Procedures / Treatments   Labs (all labs ordered are listed, but only abnormal results are displayed) Labs Reviewed  COMPREHENSIVE METABOLIC PANEL - Abnormal; Notable for the following components:      Result Value   Glucose, Bld 107 (*)    BUN 7 (*)    All other components within normal limits  URINALYSIS, ROUTINE W REFLEX MICROSCOPIC - Abnormal; Notable for the following components:   Color, Urine STRAW (*)    Specific Gravity, Urine 1.004 (*)    All other components within normal limits  LIPASE, BLOOD  CBC    EKG None  Radiology CT ABDOMEN PELVIS W CONTRAST  Result Date: 11/08/2022 CLINICAL DATA:  Left lower quadrant abdominal pain EXAM: CT ABDOMEN AND PELVIS WITH CONTRAST TECHNIQUE: Multidetector CT imaging of the abdomen and pelvis was performed using the standard protocol following bolus administration of intravenous contrast. RADIATION DOSE REDUCTION: This exam was performed according to the departmental dose-optimization program which includes automated exposure control, adjustment of the mA and/or kV according to patient size and/or use of iterative reconstruction technique. CONTRAST:  01/07/2023 OMNIPAQUE IOHEXOL 300 MG/ML  SOLN COMPARISON:  None Available. FINDINGS: Lower chest: Bibasilar dependent atelectasis. No acute abnormality. 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 Hepatobiliary: No focal liver abnormality is seen. No gallstones, gallbladder wall thickening, or biliary dilatation. Pancreas: Unremarkable. No pancreatic ductal dilatation or surrounding inflammatory changes. Spleen: Normal in size without focal abnormality. Adrenals/Urinary Tract: Adrenal glands are unremarkable. Kidneys are normal, without renal calculi, focal lesion, or hydronephrosis. Markedly distended urinary bladder with fullness of bilateral ureters. Stomach/Bowel: Stomach is within normal limits. Small bowel loops are normal in caliber. Appendix not identified. Marked colonic diverticulosis. Focal short-segment sigmoid colonic wall thickening with mild adjacent fat stranding (series 2, image 56; series 5 image 56-60) concerning for acute diverticulitis without evidence of adjacent drainable fluid collection or abscess. Moderate amount of retained stool in the colon suggesting constipation. Vascular/Lymphatic: Aortic atherosclerosis. No enlarged abdominal or pelvic lymph nodes. Reproductive: Status post hysterectomy. No adnexal masses. Other: No abdominal wall hernia  or abnormality. No abdominopelvic ascites. Musculoskeletal: Mild-to-moderate multilevel degenerate disc disease of the lumbar spine with associated facet joint arthropathy. IMPRESSION: 1. Marked colonic diverticulosis with focal short-segment sigmoid colonic wall thickening with mild adjacent fat stranding concerning for acute diverticulitis. No evidence of adjacent drainable fluid collection or abscess. 2. Moderate amount of retained stool in the colon suggesting constipation. 3. Aortic atherosclerosis. 4. 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 Aortic Atherosclerosis (ICD10-I70.0). Electronically Signed   By: Larose Hires D.O.   On: 11/08/2022 16:34    Procedures Procedures    Medications Ordered in ED Medications  iohexol (OMNIPAQUE) 300 MG/ML solution 100 mL (100 mLs  Intravenous Contrast Given 11/08/22 1603)  amoxicillin-clavulanate (AUGMENTIN) 875-125 MG per tablet 1 tablet (1 tablet Oral Given 11/08/22 1707)    ED Course/ Medical Decision Making/ A&P                           Medical Decision Making This patient presents to the ED for concern of lower abdominal pain, this involves an extensive number of treatment options, and is a complaint that carries with it a high risk of complications and morbidity.  The differential diagnosis includes diverticulitis, cholecystitis, UTI, other   Co morbidities that complicate the patient evaluation  CVA, hypertension   Additional history obtained:  Additional history obtained from EMR External records from outside source obtained and reviewed including discharge summary for recent diverticulitis   Lab Tests:  I Ordered, and personally interpreted labs.  The pertinent results include: CBC CMP and urinalysis all reassuring specifically no leukocytosis   Imaging Studies ordered:  I ordered imaging studies including CT abdomen pelvis I independently visualized and interpreted imaging which showed diverticulitis I agree with the radiologist interpretation   Cardiac Monitoring: / EKG:  The patient was maintained on a cardiac monitor.  I personally viewed and interpreted the cardiac monitored which showed an underlying rhythm of: Sinus rhythm    Problem List / ED Course / Critical interventions / Medication management  Lower abdominal pain-diverticulitis, patient started on Augmentin.  When she was discharged from the hospital she was given Flagyl.  Discussed that perhaps different antibiotic will resolve her symptoms, advised on close PCP follow-up in 2 days for recheck.  No in the CT report the radiologist noted patient's bladder was very distended with some fullness of her ureters.  This was does not mentioned in the impression.  Patient had reported to me shortly after coming from CT that she had to  urinate very badly was able to void without difficulty.  She is not having any fullness and has no abdominal distention on repeat exam.   Requested that I look at her labia as she is having some itching and discomfort of her labia majora for the past several weeks.  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  She is given short course of Premarin.  I discussed with her that this is a hormone.  She denies any history of breast cancer or other estrogen sensitive cancers, she denies history of VTE.  Discussed with her that she would need to follow with her primary care doctor for any continued management of this.  I have reviewed the patients home medicines and have made adjustments as needed      Amount and/or Complexity of Data Reviewed Labs: ordered. Radiology: ordered.  Risk Prescription drug management.  Final Clinical Impression(s) / ED Diagnoses Final diagnoses:  Diverticulitis  Atrophic vaginitis    Rx / DC Orders ED Discharge Orders          Ordered    amoxicillin-clavulanate (AUGMENTIN) 875-125 MG tablet  Every 12 hours        11/08/22 1744    conjugated estrogens (PREMARIN) vaginal cream  Daily        11/08/22 1744              Gwenevere Abbot, PA-C 11/08/22 1853    Fredia Sorrow, MD 11/11/22 2039

## 2022-11-08 NOTE — ED Notes (Addendum)
Pt reported redness to labia majora, nurse assessed and no redness noted. Pt urinated in the toilet.

## 2022-11-15 ENCOUNTER — Telehealth: Payer: Self-pay | Admitting: Gastroenterology

## 2022-11-15 DIAGNOSIS — K5732 Diverticulitis of large intestine without perforation or abscess without bleeding: Secondary | ICD-10-CM

## 2022-11-15 MED ORDER — DICYCLOMINE HCL 10 MG PO CAPS: 10.0000 mg | ORAL_CAPSULE | Freq: Three times a day (TID) | ORAL | 0 refills | Status: DC

## 2022-11-15 NOTE — Telephone Encounter (Signed)
Patient called states she had called earlier today requesting a call from a nurse to discuss her symptoms, She said she recently was seen at the ED and was given antibiotics for Diverticulitis. The patient said she is still not doing well seeking to get further advise.

## 2022-11-15 NOTE — Telephone Encounter (Signed)
Sorry to hear this, thanks for the note.  If she hasn't tried it already, would add Bentyl 10mg  every 8 hours to see if that helps. Hopefully with more time this improves. We could ask her to go to the lab for CBC to make sure okay. If WBC is elevated would give another course of antibiotics. If she develops fevers or severe worsening she needs to let us know would pursue repeat imaging. Once recovered from this she will need a colonoscopy to re-evaluate her colon and should keep appointment with Amy next Monday. Thanks

## 2022-11-15 NOTE — Telephone Encounter (Signed)
Called and spoke with patient regarding Dr. Doyne Keel recommendations. Pt will stop by for labs at her convenience this week, pt is aware that results will determine if she needs additional antibiotic treatment. Pt would like to have RX for Bentyl on hand, this has been sent to her pharmacy on file per pt request. Pt has been advised to keep her follow up as scheduled for next week with Amy. Pt is aware that she will need a colonoscopy once she is feeling better. Pt verbalized understanding and had no concerns at the end of the call.  Lab order in epic. Bentyl RX sent to pharmacy on file.

## 2022-11-15 NOTE — Telephone Encounter (Signed)
Returned call to patient. This is her 2nd occurrence of diverticulitis in the past couple of months. Pt reports that she was prescribed Augmentin while in the ED and her last dose will be today. Pt states that after she started the medication, she felt a little better and now she feels like she is having the same symptoms that she had when the flare first started. Pt states that she has been eating a bland diet. Denies any fever. She does have lower abdominal tenderness and soreness. Pt is concerned due to possible complications of diverticulitis. Last colonoscopy was in 2015. She is scheduled for a hospital follow up on Monday with Amy Esterwood at 9 am. I did inform patient that sometimes there will be residual pain even after completing treatment. Please advise, thanks.

## 2022-11-15 NOTE — Addendum Note (Signed)
Addended by: Yevette Edwards on: 11/15/2022 03:41 PM   Modules accepted: Orders

## 2022-11-17 ENCOUNTER — Other Ambulatory Visit (INDEPENDENT_AMBULATORY_CARE_PROVIDER_SITE_OTHER): Payer: Medicare Other

## 2022-11-17 DIAGNOSIS — K5732 Diverticulitis of large intestine without perforation or abscess without bleeding: Secondary | ICD-10-CM | POA: Diagnosis not present

## 2022-11-17 LAB — CBC WITH DIFFERENTIAL/PLATELET
Basophils Absolute: 0.1 10*3/uL (ref 0.0–0.1)
Basophils Relative: 0.9 % (ref 0.0–3.0)
Eosinophils Absolute: 0.1 10*3/uL (ref 0.0–0.7)
Eosinophils Relative: 1.7 % (ref 0.0–5.0)
HCT: 37.5 % (ref 36.0–46.0)
Hemoglobin: 12.8 g/dL (ref 12.0–15.0)
Lymphocytes Relative: 17.9 % (ref 12.0–46.0)
Lymphs Abs: 1.3 10*3/uL (ref 0.7–4.0)
MCHC: 34.1 g/dL (ref 30.0–36.0)
MCV: 87.3 fl (ref 78.0–100.0)
Monocytes Absolute: 0.6 10*3/uL (ref 0.1–1.0)
Monocytes Relative: 9.2 % (ref 3.0–12.0)
Neutro Abs: 4.9 10*3/uL (ref 1.4–7.7)
Neutrophils Relative %: 70.3 % (ref 43.0–77.0)
Platelets: 259 10*3/uL (ref 150.0–400.0)
RBC: 4.29 Mil/uL (ref 3.87–5.11)
RDW: 13.7 % (ref 11.5–15.5)
WBC: 7 10*3/uL (ref 4.0–10.5)

## 2022-11-18 ENCOUNTER — Other Ambulatory Visit (HOSPITAL_COMMUNITY): Payer: Self-pay

## 2022-11-18 ENCOUNTER — Telehealth: Payer: Self-pay | Admitting: Neurology

## 2022-11-18 NOTE — Telephone Encounter (Signed)
Pt is calling. Asking will Dr. Jaynee Eagles start refilling her HYDROcodone-acetaminophen (NORCO/VICODIN) 5-325 MG tablet because the orthopedic surgeon told her she needs to follow-up with Dr. Jaynee Eagles because of the symptoms she is having.Stated her legs are stings her like bees are inside her. Pt said this started on Dec 20 th when she was told she had had a stroke. Pt is requesting a call back from nurse to discuss

## 2022-11-21 ENCOUNTER — Ambulatory Visit: Payer: Medicare Other | Admitting: Physician Assistant

## 2022-11-22 NOTE — Telephone Encounter (Signed)
Pt returned call. Pt states she would like to be called back on [phone].

## 2022-11-22 NOTE — Telephone Encounter (Signed)
LMVM for pt that we returned call.

## 2022-11-22 NOTE — Telephone Encounter (Signed)
I called pt.  She has appt 12-06-2022 for post stroke follow up.  She is having bilateral leg stinging, (like a hive of bees) she is taking gabapentin 300mg  po bid (ordered TID) but she had some hydrocodone from Dr. Ellene Route which she started to use for this and has made it easier for her to rest and sleep.  I told her that since new problem,  we would need to see her, and we do not order hydrocodone very often at all.  She will need to speak/call her pcp since they have ordered the  gabapentin previously (increase dose??).  She spoke or saw PA from the office they do not order hydrocodone either.  I told her I placed on waitlist with Dr. Jaynee Eagles prior to 12-06-2022 appt.  Pt verbalized understanding.

## 2022-11-30 ENCOUNTER — Telehealth: Payer: Self-pay | Admitting: Gastroenterology

## 2022-11-30 NOTE — Telephone Encounter (Signed)
Returned call to patient. I informed pt that Amy and Dr. Havery Moros did not have any sooner appts then what she was scheduled. I offered patient an appt tomorrow with Carl Best, NP at 1:30 pm. Pt accepted this appt. Pt had no concerns at the end of the call.

## 2022-11-30 NOTE — Telephone Encounter (Signed)
Inbound call from patient stating that she has an appointment on 2/9 at 10:00 with Amy Esterwood and is requesting a call back to discuss if there is anyway she can be seen sooner because she does not feel well. Please advise.

## 2022-12-01 ENCOUNTER — Ambulatory Visit: Payer: Medicare Other | Admitting: Nurse Practitioner

## 2022-12-01 ENCOUNTER — Encounter: Payer: Self-pay | Admitting: Nurse Practitioner

## 2022-12-01 VITALS — BP 100/80 | HR 80 | Ht 63.0 in | Wt 136.0 lb

## 2022-12-01 DIAGNOSIS — K5732 Diverticulitis of large intestine without perforation or abscess without bleeding: Secondary | ICD-10-CM

## 2022-12-01 DIAGNOSIS — I639 Cerebral infarction, unspecified: Secondary | ICD-10-CM

## 2022-12-01 NOTE — Patient Instructions (Signed)
Take Gas-X - 1 pill by mouth twice daily.   Use Miralax 1 capful daily in at least 8 ounces of water daily.   Office will contact you at a later time to discuss scheduling Colonoscopy with Dr. Havery Moros.   Go to ER if you have severe abdominal pain.   _______________________________________________________  If your blood pressure at your visit was 140/90 or greater, please contact your primary care physician to follow up on this.  _______________________________________________________  If you are age 48 or older, your body mass index should be between 23-30. Your Body mass index is 24.09 kg/m. If this is out of the aforementioned range listed, please consider follow up with your Primary Care Provider.  If you are age 33 or younger, your body mass index should be between 19-25. Your Body mass index is 24.09 kg/m. If this is out of the aformentioned range listed, please consider follow up with your Primary Care Provider.   ________________________________________________________  The  GI providers would like to encourage you to use [facility] to communicate with providers for non-urgent requests or questions.  Due to long hold times on the telephone, sending your provider a message by [facility] may be a faster and more efficient way to get a response.  Please allow 48 business hours for a response.  Please remember that this is for non-urgent requests.  _______________________________________________________  Thank you for choosing me and [facility].  [place]

## 2022-12-01 NOTE — Progress Notes (Signed)
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  She was last seen by Dr. Havery Moros in office 02/17/2022 due to having chronic chest pain, pill dysphagia and chronic periumbilical and lower abdominal pain.  Prior cardiac workup was negative.  EGD 04/2020 showed a 1 cm hiatal hernia and reactive gastropathy. 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  She presents today for GI follow-up after being admitted to the hospital with acute sigmoid diverticulitis with a perforation and acute CVA. She presented to the ED 10/19/2022 with numbness to her right arm/hand and left hand and left upper thigh. She also endorsed having LUQ pain. CTAP identified acute sigmoid diverticulitis with a contained perforation.  She was seen by general surgery who recommended conservative management with IV antibiotics, IV fluids and bowel rest.  CT of the head identified a left thalamic hypodensity consistent with an infarction.  She was placed on Plavix and aspirin  She was just plans to discontinue Plavix 11/10/2021.  She was discharged home 10/21/2022 on Cipro and Flagyl to complete a full 10-day course.  She is scheduled to see neurology 12/10/2022.  Her abdominal pain abated after she took Cipro and Flagyl for 10 days, however, she had recurrence of LLQ pain therefore she presented to [facility] ED 11/08/2022.  WBC 7.7.  Hemoglobin 13.  Bratten 0.86.  Normal LFTs.  CTAP showed marked colonic diverticulosis with focal short segment sigmoid colonic wall thickening with mild adjacent fat stranding concerning for acute diverticulitis without abscess or perforation and a  moderate amount of retained stool was in the colon.  She was discharged home on Augmentin 875 mg 1 p.o. twice daily for 7 days.  She presents today for further follow-up.  She completed the 7-day course of Augmentin and she assesses her abdominal pain has not worsened but has not abated.  She reported passing squiggly brown nonbloody stools since she was discharged from the hospital, however, today she passed a normal formed bulked brown stool.  Repeat CBC 11/17/2022 showed a WBC count of 7.  Hemoglobin 12.8.  She complains of burning pain in her legs which started since she was discharged from the hospital.   She underwent a colonoscopy 03/03/2014 by Dr. Watt Climes with [facility] which showed left-sided diverticulosis, internal and external hemorrhoids.     Latest Ref Rng & Units 11/17/2022    8:28 AM 11/08/2022   10:09 AM 10/20/2022    4:09 AM  CBC  WBC 4.0 - 10.5 K/uL 7.0  7.7  10.4   Hemoglobin 12.0 - 15.0 g/dL 12.8  13.0  11.2   Hematocrit 36.0 - 46.0 % 37.5  40.7  34.6   Platelets 150.0 - 400.0 K/uL 259.0  293  197        Latest Ref Rng & Units 11/08/2022   10:09 AM 10/21/2022    4:12 AM 10/20/2022    4:09 AM  CMP  Glucose 70 - 99 mg/dL 107  96  104   BUN 8 - 23 mg/dL 7  5  8    Creatinine 0.44 - 1.00 mg/dL 0.86  0.91  0.84   Sodium 135 - 145 mmol/L 138  139  138   Potassium 3.5 - 5.1  mmol/L 4.0  3.7  3.3   Chloride 98 - 111 mmol/L 99  106  102   CO2 22 - 32 mmol/L 28  29  28    Calcium 8.9 - 10.3 mg/dL 9.3  8.6  8.6   Total Protein 6.5 - 8.1 g/dL 6.9   5.8   Total Bilirubin 0.3 - 1.2 mg/dL 0.5   0.7   Alkaline Phos 38 - 126 U/L 77   68   AST 15 - 41 U/L 17   13   ALT 0 - 44 U/L 11   11     CTAP 11/08/2022: 1. Marked colonic diverticulosis with focal short-segment sigmoid colonic wall thickening with mild adjacent fat stranding concerning for acute diverticulitis. No evidence of adjacent drainable fluid collection or abscess. 2. Moderate amount of retained stool in the colon  suggesting constipation. 3. Aortic atherosclerosis. 4. 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  CTAP with contrast 10/19/2022: Sigmoid diverticulitis with 9 mm gaseous collection that could be in or adjacent to the offending diverticulum.  PAST GI PROCEDURES:  EGD 05/19/2020: - A 1 cm hiatal hernia was present. - The exam of the esophagus was otherwise normal. - The entire examined stomach was normal. Biopsies were taken with a cold forceps from antrum, incisura, body for Helicobacter pylori testing. - The duodenal bulb and second portion of the duodenum were normal. Biopsies for histology were taken with a cold forceps for evaluation of celiac disease.   1. Surgical [P], duodenum - BENIGN DUODENAL MUCOSA - NO ACUTE INFLAMMATION, VILLOUS BLUNTING OR INCREASED INTRAEPITHELIAL LYMPHOCYTES 2. Surgical [P], gastric antrum and gastric body - REACTIVE GASTROPATHY - NO H. PYLORI OR INTESTINAL METAPLASIA IDENTIFIED - SEE COMMENT  Colonoscopy 03/03/2014 - diverticulosis / hemorrhoids - no polyps   Current Outpatient Medications on File Prior to Visit  Medication Sig Dispense Refill   amLODipine (NORVASC) 5 MG tablet Take 1 tablet (5 mg total) by mouth daily. 30 tablet 1   aspirin EC 81 MG tablet Take 1 tablet (81 mg total) by mouth daily. Swallow whole. 30 tablet 12   BEE POLLEN PO Take 2 capsules by mouth daily.     famotidine (PEPCID) 20 MG tablet Take 20 mg by mouth daily as needed for heartburn or indigestion.     gabapentin (NEURONTIN) 300 MG capsule Take 300 mg by mouth 3 (three) times daily as needed (pain).     losartan (COZAAR) 50 MG tablet Take 1 tablet (50 mg total) by mouth daily. 30 tablet 1   Propylene Glycol (SYSTANE BALANCE) 0.6 % SOLN Place 1 drop into both eyes daily as needed (dry eye).     traZODone (DESYREL) 100 MG tablet Take 100 mg by mouth at bedtime.     zolpidem (AMBIEN) 10 MG tablet Take 10 mg by mouth  every evening.     amoxicillin-clavulanate (AUGMENTIN) 875-125 MG tablet Take 1 tablet by mouth every 12 (twelve) hours. (Patient not taking: Reported on 12/01/2022) 14 tablet 0   atorvastatin (LIPITOR) 40 MG tablet Take 1 tablet (40 mg total) by mouth every evening. (Patient not taking: Reported on 12/01/2022) 30 tablet 1   dicyclomine (BENTYL) 10 MG capsule Take 1 capsule (10 mg total) by mouth every 8 (eight) hours. For abdominal pain/cramping (Patient not taking: Reported on 12/01/2022) 90 capsule 0   fluconazole (DIFLUCAN) 150 MG tablet Take 150 mg by mouth every 3 (three) days. (Patient not taking: Reported on 12/01/2022)  HYDROcodone-acetaminophen (NORCO/VICODIN) 5-325 MG tablet Take 1 tablet by mouth 2 (two) times daily as needed for moderate pain (10/18/2022). (Patient not taking: Reported on 12/01/2022)     ondansetron (ZOFRAN) 4 MG tablet Take 1 tablet (4 mg total) by mouth every 6 (six) hours as needed for nausea. (Patient not taking: Reported on 12/01/2022) 20 tablet 0   No current facility-administered medications on file prior to visit.   Allergies  Allergen Reactions   Escitalopram Nausea Only and Other (See Comments)    Nausea and dizziness   Pantoprazole Other (See Comments)    Severe headaches    Naproxen Sodium Anxiety   Current Medications, Allergies, Past Medical History, Past Surgical History, Family History and Social History were reviewed in [employer] record.  Review of Systems:   Constitutional: Negative for fever, sweats, chills or weight loss.  Respiratory: Negative for shortness of breath.   Cardiovascular: Negative for chest pain, palpitations and leg swelling.  Gastrointestinal: See HPI.  Musculoskeletal: Negative for back pain or muscle aches.  Neurological: Negative for dizziness, headaches or paresthesias.   Physical Exam: BP 100/80   Pulse 80   Ht 5\' 3"  (1.6 m)   Wt 136 lb (61.7 kg)   BMI 24.09 kg/m  General: 77 year old female in  no acute distress. Head: Normocephalic and atraumatic. Eyes: No scleral icterus. Conjunctiva pink . Ears: Normal auditory acuity. Mouth: Dentition intact. No ulcers or lesions.  Lungs: Clear throughout to auscultation. Heart: Regular rate and rhythm, no murmur. Abdomen: Soft, nondistended.  Abdomen currently nontender. No masses or hepatomegaly. Normal bowel sounds x 4 quadrants.  Rectal: Deferred. Musculoskeletal: Symmetrical with no gross deformities. Extremities: No edema. Neurological: Alert oriented x 4. No focal deficits.  Psychological: Alert and cooperative. Normal mood and affect  Assessment and Recommendations:  52) 77 year old female admitted to the hospital with acute sigmoid diverticulitis with a contained perforation, conservatively without surgical intervention and antibiotics x 10 days. She had recurrent LLQ pain, presented to the ED 11/08/2022. CTAP showed marked colonic diverticulosis with focal short segment sigmoid colonic wall thickening with mild adjacent fat stranding concerning for acute diverticulitis without abscess or perforation and a moderate amount of retained stool in the colon.  She was discharged home on Augmentin 875 mg 1 p.o. twice daily for 7 days.  She continues to have intermittent LLQ pain on a daily basis, no abdominal pain at this time.  WBC 7.0 on 11/17/2022. Colonoscopy in 2015 showed sigmoid diverticulosis without colon polyps. -Colonoscopy at [facility], benefits and risks discussed including risk with sedation, risk of bleeding, perforation and infection.  Timing of colonoscopy to be determined.  I will consult with Dr. Havery Moros prior to scheduling a colonoscopy and to verify if she should be on oral antibiotics up until her colonoscopy date.   -Await neuro follow-up prior to proceeding with a colonoscopy in the setting of recent acute CVA -Patient was instructed to go to the ED if she has worsening abdominal pain, she may require IV antibiotics -MiraLAX  nightly as needed -Push fluids, diet as tolerated  2) Admitted to the hospital with acute CVA, treated with Plavix and ASA. Plavix discontinued 11/10/2022. -Proceed with neurology follow-up 12/10/2022

## 2022-12-02 ENCOUNTER — Other Ambulatory Visit: Payer: Self-pay | Admitting: Nurse Practitioner

## 2022-12-02 DIAGNOSIS — K5732 Diverticulitis of large intestine without perforation or abscess without bleeding: Secondary | ICD-10-CM

## 2022-12-02 MED ORDER — AMOXICILLIN-POT CLAVULANATE 875-125 MG PO TABS: 1.0000 | ORAL_TABLET | Freq: Two times a day (BID) | ORAL | 0 refills | Status: AC

## 2022-12-02 NOTE — Progress Notes (Signed)
Okay, this is a difficult situation. Agree with extending her antibiotics another week or 2 and following up a CBC. If she feels she is getting worse over time she may need a repeat CT scan. Okay to use IB gard for bowel spasm. I would also keep her on Miralax to keep stools loose. She may be having symptomatic diverticulosis with this just from luminal narrowing. Has she seen surgery at all? Although she is not a great candidate for surgery given her recent stroke. Looking at her CT, colonoscopy may be quite challenging. I'd ideally like to give her at least 6-8 weeks to cool off from her diverticulitis, just not sure if we can get her asymptomatic until that time. We could do colonoscopy in 2 months or so at hospital if she is otherwise stable in the interim.

## 2022-12-02 NOTE — Progress Notes (Signed)
I called the patient and left a detailed message on her personal voicemail.  Patient to start Augmentin 875 mg 1 p.o. twice daily for 10 days.  May take IBgard 1 p.o. twice daily OTC for abdominal pain.  Patient to return to our lab on Monday, 12/05/2022 for CBC, BMP and CRP.  Lasix was previously recommended nightly.  Patient was instructed to go to the emergency room if she develops worsening abdominal pain.

## 2022-12-02 NOTE — Progress Notes (Signed)
Refer to office visit 12/01/2022, see addendum per Dr. Havery Moros.  I attempted to contact the patient at this time but did not reach her directly.  I left a message on her personal voicemail to start Augmentin 875 mg 1 tab p.o. twice daily for 10 days, to return to our lab on Monday, 12/05/2022 to have repeat laboratory studies done to include a CBC, BMP and CRP.  IBgard 1 p.o. twice daily OTC for abdominal pain.  Patient was also instructed to go to the emergency room if she develops worsening abdominal pain.

## 2022-12-02 NOTE — Progress Notes (Signed)
Dr. Havery Moros, I will contact the patient, I will put her on Augmentin 875mg  bid x 10 days, check CBC done on Monday 2/5 and if she feels any worse over the weekend to go to the ED for labs and evaluation.  I did not prescribe dicyclomine as I did not want to cause any dizziness/lightheadedness in setting of recent stroke.  I will have her try IBgard if you are okay with that.  I will let you know what her follow-up CBC showed and we can revise the plan early next week.  She has not seen general surgery as an outpatient, however, I will verify if she has any future appointment.

## 2022-12-05 ENCOUNTER — Telehealth: Payer: Self-pay

## 2022-12-05 ENCOUNTER — Other Ambulatory Visit (INDEPENDENT_AMBULATORY_CARE_PROVIDER_SITE_OTHER): Payer: Medicare Other

## 2022-12-05 DIAGNOSIS — K5732 Diverticulitis of large intestine without perforation or abscess without bleeding: Secondary | ICD-10-CM

## 2022-12-05 LAB — CBC WITH DIFFERENTIAL/PLATELET
Basophils Absolute: 0.1 10*3/uL (ref 0.0–0.1)
Basophils Relative: 0.9 % (ref 0.0–3.0)
Eosinophils Absolute: 0.1 10*3/uL (ref 0.0–0.7)
Eosinophils Relative: 1.5 % (ref 0.0–5.0)
HCT: 39.1 % (ref 36.0–46.0)
Hemoglobin: 13 g/dL (ref 12.0–15.0)
Lymphocytes Relative: 18.5 % (ref 12.0–46.0)
Lymphs Abs: 1.2 10*3/uL (ref 0.7–4.0)
MCHC: 33.3 g/dL (ref 30.0–36.0)
MCV: 88.8 fl (ref 78.0–100.0)
Monocytes Absolute: 0.6 10*3/uL (ref 0.1–1.0)
Monocytes Relative: 9.1 % (ref 3.0–12.0)
Neutro Abs: 4.7 10*3/uL (ref 1.4–7.7)
Neutrophils Relative %: 70 % (ref 43.0–77.0)
Platelets: 275 10*3/uL (ref 150.0–400.0)
RBC: 4.4 Mil/uL (ref 3.87–5.11)
RDW: 14.1 % (ref 11.5–15.5)
WBC: 6.7 10*3/uL (ref 4.0–10.5)

## 2022-12-05 LAB — BASIC METABOLIC PANEL
BUN: 5 mg/dL — ABNORMAL LOW (ref 6–23)
CO2: 30 mEq/L (ref 19–32)
Calcium: 9.3 mg/dL (ref 8.4–10.5)
Chloride: 102 mEq/L (ref 96–112)
Creatinine, Ser: 0.92 mg/dL (ref 0.40–1.20)
GFR: 60.39 mL/min (ref >60.00)
Glucose, Bld: 105 mg/dL — ABNORMAL HIGH (ref 70–99)
Potassium: 4 mEq/L (ref 3.5–5.1)
Sodium: 140 mEq/L (ref 135–145)

## 2022-12-05 LAB — C-REACTIVE PROTEIN: CRP: <1 mg/dL (ref 0.5–20.0)

## 2022-12-05 NOTE — Telephone Encounter (Signed)
See lab notes and cc'd notes per last office visit.

## 2022-12-05 NOTE — Progress Notes (Signed)
Agree if she is having pain despite oral antibiotics would recommend repeat CT - if persistent diverticulitis or pain despite oral antibiotics, may need IV antibiotics.

## 2022-12-05 NOTE — Telephone Encounter (Signed)
Pt stated that she received a message on Friday and this AM from [place]. Pt requesting to speak with [facility].

## 2022-12-05 NOTE — Progress Notes (Signed)
Dr. Havery Moros, repeat CBC today was ok, normal WBC. CRP normal.  He stated having a good day yesterday but she is having more central lower abdominal pain today.  She is passing several mushy stools daily and she is concerned about adding MiraLAX at this point.  She is back on Augmentin twice daily since Friday.  I instructed her to take one half dose of MiraLAX every other day if her stools are getting formed.  She will start IBgard which she has not yet purchased.  Her last CTAP wa 3 and half weeks ago.  I will request a repeat CTAP at this time unless you recommend otherwise.  Do communicate with [name] to hold a spot for colonoscopy at [facility] in 2 months?

## 2022-12-05 NOTE — Progress Notes (Signed)
I called the patient and left a detailed message on her voicemail to verify if she received my message on Friday to get labs done today in our basement labs to include a CBC, BMP and CRP.  I will also discuss Dr. Doyne Keel recommendations in his addendum as noted below.  Await the patient's return call.

## 2022-12-06 ENCOUNTER — Encounter: Payer: Self-pay | Admitting: Neurology

## 2022-12-06 ENCOUNTER — Ambulatory Visit: Payer: Medicare Other | Admitting: Neurology

## 2022-12-06 ENCOUNTER — Other Ambulatory Visit: Payer: Self-pay

## 2022-12-06 VITALS — BP 142/83 | HR 82 | Ht 63.0 in | Wt 136.2 lb

## 2022-12-06 DIAGNOSIS — M4802 Spinal stenosis, cervical region: Secondary | ICD-10-CM

## 2022-12-06 DIAGNOSIS — I639 Cerebral infarction, unspecified: Secondary | ICD-10-CM | POA: Diagnosis not present

## 2022-12-06 DIAGNOSIS — Z9181 History of falling: Secondary | ICD-10-CM

## 2022-12-06 DIAGNOSIS — R1030 Lower abdominal pain, unspecified: Secondary | ICD-10-CM

## 2022-12-06 DIAGNOSIS — M48062 Spinal stenosis, lumbar region with neurogenic claudication: Secondary | ICD-10-CM

## 2022-12-06 DIAGNOSIS — G959 Disease of spinal cord, unspecified: Secondary | ICD-10-CM

## 2022-12-06 DIAGNOSIS — M6281 Muscle weakness (generalized): Secondary | ICD-10-CM

## 2022-12-06 DIAGNOSIS — E785 Hyperlipidemia, unspecified: Secondary | ICD-10-CM

## 2022-12-06 DIAGNOSIS — R270 Ataxia, unspecified: Secondary | ICD-10-CM

## 2022-12-06 DIAGNOSIS — R258 Other abnormal involuntary movements: Secondary | ICD-10-CM

## 2022-12-06 DIAGNOSIS — K5732 Diverticulitis of large intestine without perforation or abscess without bleeding: Secondary | ICD-10-CM

## 2022-12-06 DIAGNOSIS — G992 Myelopathy in diseases classified elsewhere: Secondary | ICD-10-CM

## 2022-12-06 DIAGNOSIS — R269 Unspecified abnormalities of gait and mobility: Secondary | ICD-10-CM

## 2022-12-06 DIAGNOSIS — R29898 Other symptoms and signs involving the musculoskeletal system: Secondary | ICD-10-CM

## 2022-12-06 DIAGNOSIS — E538 Deficiency of other specified B group vitamins: Secondary | ICD-10-CM

## 2022-12-06 DIAGNOSIS — G1229 Other motor neuron disease: Secondary | ICD-10-CM

## 2022-12-06 DIAGNOSIS — R2689 Other abnormalities of gait and mobility: Secondary | ICD-10-CM

## 2022-12-06 DIAGNOSIS — R202 Paresthesia of skin: Secondary | ICD-10-CM

## 2022-12-06 DIAGNOSIS — I6381 Other cerebral infarction due to occlusion or stenosis of small artery: Secondary | ICD-10-CM | POA: Diagnosis not present

## 2022-12-06 DIAGNOSIS — R292 Abnormal reflex: Secondary | ICD-10-CM

## 2022-12-06 NOTE — Progress Notes (Addendum)
[facility]    Provider:  Dr Kayla Terry Primary Care Provider:  Christain Sacramento, MD  CC: Stroke  Addedndum: 12/12/2022: Please call patient, her b12 is on the lower end and her methylmalonic acid(MMA) is elevated which means she does still have B12 deficiency which can be seriou. She will have to take supplements throughout her life.  1000-2075mg B12 daily po long term  recheck B12 in 3-4 months  B12 deficiency can cause weakness, fatigue, easy bruising or bleeding,sore tongue, stomach upset, weight loss, and diarrhea or constipation, tingling or numbness to the fingers and toes, difficulty walking, mood changes, depression, memory loss, disorientation and, in severe cases, dementia. Recommend 1000-2000 mcg B12 daily.   An elevated MMA is a very sensitive and specific marker of B12 deficiency. There is evidence that up to 10% of individuals who have normal or low normal B12 values (between 150 and 400 pg/mL) may develop neuropsychiatric sequelae of B12 deficiency without any evidence of megaloblastic anemia. The MMA test can identify this population of functionally B12-deficient individuals. In these patients, oral cyanocobalamin 1-2 mg/day will normalize the MMA and hopefully improve their symptoms  12/06/2022: 77y.o. female here as requested by Kayla Terry for worsening headache.  She has a past medical history of GERD, herpes zoster without complications, hypertension,chronic pain, hyperlipidemia, restless leg syndrome, nocturia, chest tightness, dizziness.  Is here today for lacunar infarct, she was not on aspirin, she has hyperlipidemia.  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  Today she is here with her friend who also provides much information.  Patient states that she woke up with acute arm weakness, she went to the emergency room and saw stroke on MRI brain.  This was considered a lacunar small vessel ischemic stroke, she was discharged on a statin (she has not taken it), dual antiplatelet and then aspirin after 3 weeks.  Today she is here to review imaging, I discussed this in detail with patient, we reviewed imaging, show to the stroke, I discussed the difference between embolic versus lacunar strokes, I also stressed the importance of taking her aspirin 81 mg daily every day and also treating her cholesterol which she is hesitant to do she has not started that.  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 No low back pain.she still has right srm weakness, numbness and tingling.  Since Dr. SVertell Limberretired she has been seeing Dr. EEllene Routeand had an MRI lumbar spine with him but has not repeated the MRI of her cervical spine.  No other focal neurologic deficits, associated symptoms, inciting events or modifiable factors.  Reviewed notes, labs and imaging from outside physicians, which showed  MRI brain 10/19/2022: Brain: Small acute left thalamocapsular infarct. Mild associated edema without significant mass effect. Midline shift. No evidence of acute hemorrhage, mass lesion, midline shift or hydrocephalus.   Vascular: See below.   Skull and upper cervical spine:  Normal marrow signal.   Sinuses/Orbits: Clear sinuses.  No acute orbital findings.   Other: No mastoid effusions.   MRA HEAD FINDINGS   Anterior circulation: Bilateral intracranial ICAs, MCAs, and ACAs are patent without  proximal high-grade stenosis.   Posterior circulation: Bilateral intradural vertebral arteries, basilar artery and bilateral posterior cerebral arteries are patent. At least moderate stenosis of the right P2 PCA appear   IMPRESSION: 1. Small acute left thalamocapsular infarct. 2. No emergent large vessel occlusion. 3. At least moderate right P2 PCA stenosis.    04/06/2022: This is a patient who we have seen in the past for similar symptoms for weakness and pain in the limbs.  At the last time we saw her we really did not have any more neurologically to offer her, we imaged her MRI brain(unremarkable) and cervical spine(moderate to severe stenosis) and had an EMG nerve conduction study(normal).  MRI of the cervical spine showed central stenosis and we sent her to Kayla Terry, he recommended surgery and she declined, we also diagnosed her with a B12 deficiency. She has similar symptoms, her upper arms and legs feel weak, she has not fallen, she feels weak. She is not taking her B12 supplements. Her back hurts just started yesyerday. No double vision or problems swallowing. No ptosis, no bulbar symptoms. Her friend is here and provides much information.  05/2020: MRI cervical spine: IMPRESSION: personally reviewed images and reviewed them wtith patient 1.  Cervical spondylosis and facet arthrosis as described above. This is most notable for moderate to severe spinal canal and severe RIGHT and moderate LEFT neuroforaminal stenosis at C6-C7.  2.  Mild to moderate spinal canal narrowing and severe bilateral neuroforaminal stenosis at C5-C6.  3.  Moderate LEFT and mild RIGHT neuroforaminal stenosis and mild spinal canal narrowing at C4-C5.  05/2020 MRI brain: IMPRESSION:  1.  No acute intracranial abnormality.  2.  Few small discrete white matter lesions are identified. These lesions are abnormal but nonspecific, usually resulting from benign/remote/incidental causes (e.g. prior  trauma/inflammation/demyelinization, or chronic ischemia associated with migraines/atherosclerosis/other vasculopathies). Favor chronic microvascular disease.   Patient complains of symptoms per HPI as well as the following symptoms: imbalance . Pertinent negatives and positives per HPI. All others negative   HPI 03/03/2021: Patient seen in the past for headaches and weakness. Today seen for weakness, stable, she feels cold all the time and has had weight loss and I apologized and explained that she should really see her primary care about this, not sure I have anymore neurologically to offer. I have seen her in the past for stable weakness, and she has had MRI brain, cervical spine and emg/ncs. We diagnosed her with b12 deficiency and sent her to Kayla Terry for cervical stenosis and myelopathy in the past. She saw Kayla Terry and he recommended surgery. She didn't have it completed, she declined.  She states no new symptoms, no of the same symptoms, she has been to different doctors since she saw me. She stays cold and she is "jittery" nervous (TSH is normal). She saw cardiology and she was evaluated and she has further evaluation on June 1st.   Patient complains of symptoms per HPI as well as the following symptoms: weight loss, feels cold. . Pertinent negatives and positives per HPI. All others negative   HPI:  Kayla Terry is a 77 y.o. female here as requested by Irene Pap DO for worsening headache.  She has a past medical history of GERD, herpes zoster without complications,  hypertension,chronic pain, hyperlipidemia, restless leg syndrome, nocturia, chest tightness, dizziness.  I reviewed Irene Pap DO's notes: Patient was last seen Mar 12, 2020, she described ear crackling and dizziness, she had previously been to the emergency room for dizziness and chest pain and troponins were negative, negative chest x-ray, negative lab work, negative MRI of the head (was a limited MR), patient's blood pressure  was elevated at the time of patient was started on amlodipine 5 mg daily.  She was diagnosed with possible GERD and started on medication.  When patient was last seen she stated her symptoms were still persistent, blood pressure between 114 and [id number] at home systolic, she was given recent eyedrops for potential glaucoma and was concerned that this could be a potential side effect of the eyedrops, she complains of dizziness and headache in the back of the head at that time for 6 days with a recent developing of left ear crackling without any ear drainage, decrease in hearing, or ear pain had a slightly clear runny nose but sore throat, coughing, dizziness feels like the room is spinning, can happen with sitting or standing, can last for hours no vomiting but she does have nausea.Shawn Lazoff DO's examination showed patient in no distress, normal head, eyes, nose, throat however she had impacted cerumen in the left ear which was removed; also cardiovascular, pulmonary, abdominal, musculoskeletal, neurologic and psychiatric examination was normal, negative Dix-Hallpike maneuver, patient's dizziness did not appear to be orthostatic related to dizziness is persistent regardless of position, she was told to try stopping the eyedrops for 10 days, meclizine as needed.   I also reviewed emergency room notes recently seen April 15, 2020, she presented with ongoing weakness, chest pain, anorexia, nausea, started May 6, she had already been to the emergency room been evaluated by gastroenterology and had a stress test the day before, she still persistently has generalized weakness without focality, pressure in her sternum, associated anorexia, nausea, fatigue, no fever, no vomiting no shortness of breath, also had one course of doxycycline.  Her blood pressure was elevated 174/116 in the emergency room, otherwise her examination showed that she was not in acute distress, rate and rhythm normal, cardiovascular, pulmonary,  abdominal, neurologic all normal.  She had an extensive work-up including imaging of her abdomen pelvis and chest, there was some mass-effect on the right ventricle but the emergency room did not think that this was significant and did further follow-up, they also reviewed the stress test, they diagnosed her with atypical chest pain.  CBC and BMP were normal.  They also checked her troponins, LFTs, D-dimer, lipase and discharged with a diagnosis of atypical chest pain.  Patient is here alone today and states that although she has been having headaches in the back of the head she is not concerned by that and her biggest concern is because her arms and legs have been feeling weak.  She has not been feeling well since 1 May.  She went to the hospital, her blood pressure was elevated, she was having pain in her chest, evaluation showed no cardiac etiology, she was started on blood pressure meds and came home since then she has been having a dull headache in the back of the head, Excedrin helps a little bit, her legs and arms stay weak, she can function but they fall asleep and her lips tingle. This all started in May at the emergency room. In early may she went to the hospital for elevated blood pressure and chest  pain. She is now off of amlodipine.  Patient reports that the weakness predates all of this above, weakness ongoing for years and worsening recently. The weakness has been worsening. She was on doxycycline recently and she had tingling in the face and the legs and arms and unclear if this made her symptoms worse, ongoing slowly progressively worsening. She feels like her arms and legs are asleep. She can even be in bed and they feel asleep and she gets up and feels better. Worse in the left arm but all the limbs are affected, she denies any difficulty getting out of a chair or off of the floor, she can get to the top of the stairs but she feels out of breath but not due to weakness. She is generally weak  all over. Slowly progressive. Numbness and tingling of all the limbs not in the face since stopping doxycycline, she has to take her time because she is afraid she is going to fall. No muscle loss or weight loss. She has noticed very occasion action tremor of the left hand and she is more cold natured. Her husband died in 2023-01-06, she is on zolpidem and she take neurontin as well. She has chronic pain. headache is mild in the back of the head not positional or exertional. 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 No double vision, no droopy eyelids, no difficulty swallowing.   Reviewed notes, labs and imaging from outside physicians, which showed  I reviewed MRI of the brain report which was of poor quality, patient was unable to tolerate the full examination, and the images that were taken were severely motion degraded.  The limited study diffusion weighted showed no acute infarct.  Review of Systems: Patient complains of symptoms per HPI as well as the following symptoms: Insomnia, restless legs, headache, numbness, weakness, dizziness, fatigue, chest pain, feeling cold, decreased energy, change in appetite. Pertinent negatives and positives per HPI. All others negative.   Social History   Socioeconomic History   Marital status: Widowed    Spouse name: Not on file   Number of children: Not on file   Years of education: Not on file   Highest education level: Not on file  Occupational History   Not on file  Tobacco Use   Smoking status: Never   Smokeless tobacco: Never  Vaping Use   Vaping Use: Never used  Substance and Sexual Activity   Alcohol use: No   Drug use: No   Sexual activity: Not on file  Other Topics Concern   Not on file  Social History Narrative   Lives alone    Her husband passed 01/07/20   Caffeine: maybe 1 cup/day, mostly water    Social Determinants of Health   Financial Resource Strain: Not on file  Food Insecurity: No Food Insecurity  (10/19/2022)   Hunger Vital Sign    Worried About Running Out of Food in the Last Year: Never true    Ran Out of Food in the Last Year: Never true  Transportation Needs: No Transportation Needs (10/19/2022)   PRAPARE - Hydrologist (Medical): No    Lack of Transportation (Non-Medical): No  Physical Activity: Not on file  Stress: Not on file  Social Connections: Not on file  Intimate Partner Violence: Not At Risk (10/19/2022)   Humiliation, Afraid, Rape, and Kick questionnaire    Fear of Current or Ex-Partner: No    Emotionally Abused: No  Physically Abused: No    Sexually Abused: No    Family History  Problem Relation Age of Onset   Cancer Mother    Leukemia Mother    Lung cancer Father    Multiple myeloma Father    Multiple sclerosis Sister    Alcohol abuse Brother    Multiple myeloma Brother    Colon cancer Neg Hx    Colon polyps Neg Hx    Stomach cancer Neg Hx    Esophageal cancer Neg Hx    Neuropathy Neg Hx    Stroke Neg Hx     Past Medical History:  Diagnosis Date   Allergy    B12 deficiency 04/2020   Cataract    Coronary artery disease    COVID-19 04/22/2021   Diverticulitis    10/19/2022   Fatty liver    GERD (gastroesophageal reflux disease)    Herpes zoster without complications    Hiatal hernia    Hypertension    Mild mitral regurgitation 2013   Mobitz type 2 second degree atrioventricular block    Pectus excavatum    pectus excavatum deformity resulting in mass effect on the RV by CT 03/2020   Pulmonary nodule    Shingles    Stroke ([place])    10/19/2022    Patient Active Problem List   Diagnosis Date Noted   Acute stroke due to ischemia ([place]) 10/21/2022   Sigmoid diverticulitis 10/19/2022   Leukocytosis 10/19/2022   Abdominal pain 08/02/2022   Hypertensive urgency 08/01/2022   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/06/2022   COVID-19 virus infection 04/23/2021   Coronary artery  disease 04/23/2021   Chest pain 04/22/2021   Chest pain of uncertain etiology    Nonspecific abnormal electrocardiogram (ECG) (EKG)    2nd degree AV block 03/25/2021   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   B12 deficiency 05/07/2020   Muscle weakness 05/06/2020   Benign essential hypertension 06/08/2017   Acid reflux disease 03/08/2016   Hyperlipidemia 03/08/2016   Nocturia 03/08/2016   Restless leg syndrome 03/08/2016   Chest tightness 08/21/2012    Past Surgical History:  Procedure Laterality Date   ABDOMINAL HYSTERECTOMY  02/1984   BACK SURGERY     CATARACT EXTRACTION, BILATERAL     LEFT HEART CATH AND CORONARY ANGIOGRAPHY N/A 04/22/2021   Procedure: LEFT HEART CATH AND CORONARY ANGIOGRAPHY;  Surgeon: Burnell Blanks, MD;  Location: [facility];  Service: Cardiovascular;  Laterality: N/A;   LUMBAR LAMINECTOMY     OOPHORECTOMY  02/29/1984   unilateral    Current Outpatient Medications  Medication Sig Dispense Refill   ALPRAZolam (XANAX) 0.25 MG tablet Take 1-2 tabs (0.29m-0.50mg) 30-60 minutes before procedure. May repeat if needed.Do not drive. 4 tablet 0   amLODipine (NORVASC) 5 MG tablet Take 1 tablet (5 mg total) by mouth daily. (Patient taking differently: Take 5 mg by mouth as needed.) 30 tablet 1   amoxicillin-clavulanate (AUGMENTIN) 875-125 MG tablet Take 1 tablet by mouth every 12 (twelve) hours. (Patient taking differently: Take 1 tablet by mouth 2 (two) times daily.) 14 tablet 0   amoxicillin-clavulanate (AUGMENTIN) 875-125 MG tablet Take 1 tablet by mouth 2 (two) times daily for 10 days. 20 tablet 0   aspirin EC 81 MG tablet Take 1 tablet (81 mg total) by mouth daily. Swallow whole. 30 tablet 12   BEE POLLEN PO Take 2 capsules by mouth daily.     famotidine (PEPCID) 20 MG tablet Take  20 mg by mouth daily as needed for heartburn or indigestion.     gabapentin (NEURONTIN) 300 MG capsule Take 300 mg by mouth 3 (three) times daily as needed  (pain).     losartan (COZAAR) 50 MG tablet Take 1 tablet (50 mg total) by mouth daily. (Patient taking differently: Take 50 mg by mouth as needed.) 30 tablet 1   Propylene Glycol (SYSTANE BALANCE) 0.6 % SOLN Place 1 drop into both eyes daily as needed (dry eye).     traZODone (DESYREL) 100 MG tablet Take 100 mg by mouth at bedtime.     zolpidem (AMBIEN) 10 MG tablet Take 10 mg by mouth every evening.     atorvastatin (LIPITOR) 40 MG tablet Take 1 tablet (40 mg total) by mouth every evening. 30 tablet 1   dicyclomine (BENTYL) 10 MG capsule Take 1 capsule (10 mg total) by mouth every 8 (eight) hours. For abdominal pain/cramping 90 capsule 0   fluconazole (DIFLUCAN) 150 MG tablet Take 150 mg by mouth every 3 (three) days.     HYDROcodone-acetaminophen (NORCO/VICODIN) 5-325 MG tablet Take 1 tablet by mouth 2 (two) times daily as needed for moderate pain (10/18/2022).     ondansetron (ZOFRAN) 4 MG tablet Take 1 tablet (4 mg total) by mouth every 6 (six) hours as needed for nausea. 20 tablet 0   No current facility-administered medications for this visit.   Facility-Administered Medications Ordered in Other Visits  Medication Dose Terry Frequency Provider Last Rate Last Admin   iohexol (OMNIPAQUE) 9 MG/ML oral solution            sodium chloride (PF) 0.9 % injection             Allergies as of 12/06/2022 - Review Complete 12/06/2022  Allergen Reaction Noted   Escitalopram Nausea Only and Other (See Comments) 12/02/2020   Pantoprazole Other (See Comments) 12/02/2020   Naproxen sodium Anxiety 12/24/2011    Vitals: BP (!) 142/83   Pulse 82   Ht 5' 3"$  (1.6 m)   Wt 136 lb 3.2 oz (61.8 kg)   BMI 24.13 kg/m  Last Weight:  Wt Readings from Last 1 Encounters:  12/06/22 136 lb 3.2 oz (61.8 kg)   Last Height:   Ht Readings from Last 1 Encounters:  12/06/22 5' 3"$  (1.6 m)   Physical exam: Exam: Gen: NAD, conversant, well nourised, obese, well groomed                     CV: RRR, no MRG. No  Carotid Bruits. No peripheral edema, warm, nontender Eyes: Conjunctivae clear without exudates or hemorrhage  Neuro: Detailed Neurologic Exam  Speech:    Speech is normal; fluent and spontaneous with normal comprehension.  Cognition:    The patient is oriented to person, place, and time;     recent and remote memory intact;     language fluent;     normal attention, concentration,     fund of knowledge Cranial Nerves:    The pupils are equal, round, and reactive to light. Attempted, pupils too small to visualize fundi. Visual fields are full to finger confrontation. Extraocular movements are intact. Trigeminal sensation is intact and the muscles of mastication are normal. The face is symmetric. The palate elevates in the midline. Hearing intact. Voice is normal. Shoulder shrug is normal. The tongue has normal motion without fasciculations.   Coordination:    Normal finger to nose and heel to shin.   Gait:  Poor balance, heelt/toe  Motor Observation:    No asymmetry, no atrophy, and no involuntary movements noted. Tone:    Normal muscle tone.    Posture:    Posture is normal. normal erect    Strength:    Right deltoid and triceps 4/5.  Weak prox legs left leg flexion     Sensation: decreased right arm     Reflex Exam:  DTR's:    Deep tendon reflexes in the upper and lower extremities are brisk bilaterally.  Right biceps and triceps are brisker than the left. Toes:    The toes are downgoing bilaterally.   Clonus: Bilat 2 beats AJ clonus     Right deltoid and triceps 4/5.  Weak prox legs abd leg leg flexion Brisk Clonus Right biceps and trieps brisker than left  Mild hoffman's on left Poor balance, heelt/toe      Assessment/Plan:   77 y.o. female here as requested for stable weakness. I diagnosed her with cervical stenosis and myelopathy close to a year ago, she saw Kayla Terry but declined surgery.She is here for the lacunar sroke, was not on asa 31m and was  not treating her HLD likely due to small vessel disease. .. Her exam today shows brisk reflexes, upper and lower proximal muscle weakness, +clonus at the AJs, brisk reflexes right>> left, +Hoffman's sign. We talked a lot about stroke today, mitigating all risk factors, her spinal stenosis likely causing bilateral leg symptoms, physical therapy.  Addedndum: 12/12/2022: Please call patient, her b12 is on the lower end and her methylmalonic acid(MMA) is elevated which means she does still have B12 deficiency which can be seriou. She will have to take supplements throughout her life.  1000-20075m B12 daily po long term  recheck B12 in 3-4 months  B12 deficiency can cause weakness, fatigue, easy bruising or bleeding,sore tongue, stomach upset, weight loss, and diarrhea or constipation, tingling or numbness to the fingers and toes, difficulty walking, mood changes, depression, memory loss, disorientation and, in severe cases, dementia. Recommend 1000-2000 mcg B12 daily.   An elevated MMA is a very sensitive and specific marker of B12 deficiency. There is evidence that up to 10% of individuals who have normal or low normal B12 values (between 150 and 400 pg/mL) may develop neuropsychiatric sequelae of B12 deficiency without any evidence of megaloblastic anemia. The MMA test can identify this population of functionally B12-deficient individuals. In these patients, oral cyanocobalamin 1-2 mg/day will normalize the MMA and hopefully improve their symptoms   Stroke: Take lipitor prescribed (she has not, I highly encouraged her), ldl 162 and have to get it down to < 70. Recheck in 3 months. Come in morning fasting 8am.  Take daily baby aspirin for stroke prevention daily MRI cervical spine and low back for stenosis: In the past severe stenosis in the cervical spine and lumbar spine with myelopathic signs Xanax for claustrophobia Physical therapy - [place]  I had a long d/w patient about her recent stroke,  risk for recurrent stroke/TIAs, personally independently reviewed imaging studies and stroke evaluation results and answered questions.Continue ASA for secondary stroke prevention and maintain strict control of hypertension with blood pressure goal below 130/90, diabetes with hemoglobin A1c goal below 6.5% and lipids with LDL cholesterol goal below 70 mg/dL.I also advised the patient to eat a healthy diet with plenty of whole grains, lean meats, fruits and vegetables, exercise regularly and maintain ideal body weight   Meds ordered this encounter  Medications   ALPRAZolam (  XANAX) 0.25 MG tablet    Sig: Take 1-2 tabs (0.80m-0.50mg) 30-60 minutes before procedure. May repeat if needed.Do not drive.    Dispense:  4 tablet    Refill:  0   Orders Placed This Encounter  Procedures   MR CERVICAL SPINE WO CONTRAST   MR LUMBAR SPINE WO CONTRAST   B12 and Folate Panel   Methylmalonic acid, serum   Lipid Panel   Ambulatory referral to Physical Therapy   I spent over 105 minutes of face-to-face and non-face-to-face time with patient on the  1. Lacunar stroke (HCC)   2. Imbalance   3. Risk for falls   4. Acute stroke due to ischemia ([facility]   5. Weakness of right arm   6. Weakness of both legs   7. Gait abnormality   8. B12 deficiency   9. Hyperlipidemia, unspecified hyperlipidemia type   10. Myelopathy ([facility]   11. Ataxia   12. Clonus   13. 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 Abnormal DTR (deep tendon reflex)   15. Stenosis of cervical spine with myelopathy (HCC)   16. Spinal stenosis of lumbar region with neurogenic claudication   17. Paresthesia of upper and lower extremities of both sides   18. Proximal limb muscle weakness    diagnosis.  This included previsit chart review, lab review, study review, order entry, electronic health record documentation, patient education on the different diagnostic and therapeutic options, counseling and coordination of care, risks and benefits of  management, compliance, or risk factor reduction    Cc: JMurlean Iba MD  ASarina Ill MD  [facility] [address]SHoliday HillsGGallant [place] [address] Phone [phone] [phone] I spent over 45 minutes of face-to-face and non-face-to-face time with patient on the  1. Lacunar stroke (HCC)   2. Imbalance   3. Risk for falls   4. Acute stroke due to ischemia ([place]   5. Weakness of right arm   6. Weakness of both legs   7. Gait abnormality   8. B12 deficiency   9. Hyperlipidemia, unspecified hyperlipidemia type   10. Myelopathy ([facility]   11. Ataxia   12. Clonus   13. 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 Abnormal DTR (deep tendon reflex)   15. Stenosis of cervical spine with myelopathy (HCC)   16. Spinal stenosis of lumbar region with neurogenic claudication   17. Paresthesia of upper and lower extremities of both sides   18. Proximal limb muscle weakness      diagnosis.  This included previsit chart review, lab review, study review, order entry, electronic health record documentation, patient education on the different diagnostic and therapeutic options, counseling and coordination of care, risks and benefits of management, compliance, or risk factor reduction

## 2022-12-06 NOTE — Patient Instructions (Addendum)
Sttroke: Take lipitor, ldl 162 and have to get it down to < 70. Recheck in 3 months. Come in morning fasting 8am.  Take daily baby aspirin for stroke prevention daily MRI cervical spine and low back Xanax for claustrophobia Physical therapy - [place]  Atorvastatin Tablets What is this medication? ATORVASTATIN (a TORE va sta tin) treats high cholesterol and reduces the risk of heart attack and stroke. It works by decreasing bad cholesterol and fats (such as LDL, triglycerides) and increasing good cholesterol (HDL) in your blood. It belongs to a group of medications called statins. Changes to diet and exercise are often combined with this medication. This medicine may be used for other purposes; ask your health care provider or pharmacist if you have questions. COMMON BRAND NAME(S): Lipitor What should I tell my care team before I take this medication? They need to know if you have any of these conditions: Diabetes Frequently drink alcohol Kidney disease Liver disease Muscle cramps, pain Stroke Thyroid disease An unusual or allergic reaction to atorvastatin, other medications, foods, dyes, or preservatives Pregnant or trying to get pregnant Breastfeeding How should I use this medication? Take this medication by mouth. Take it as directed on the label at the same time every day. You can take it with or without food. If it upsets your stomach, take it with food. Keep taking it unless your care team tells you to stop. Do not take this medication with grapefruit juice. Talk to your care team about the use of this medication in children. While it may be prescribed for children as young as 10 years for selected conditions, precautions do apply. Overdosage: If you think you have taken too much of this medicine contact a poison control center or emergency room at once. NOTE: This medicine is only for you. Do not share this medicine with others. What if I miss a dose? If you miss a dose, take  it as soon as you can unless it is more than 12 hours late. If it is more than 12 hours late, skip the missed dose. Take the next dose at the normal time. If it is almost time for your next dose, take only that dose. Do not take double or extra doses. What may interact with this medication? Do not take this medication with any of the following: Dasabuvir; ombitasvir; paritaprevir; ritonavir Lonafarnib Ombitasvir; paritaprevir; ritonavir Posaconazole Red yeast rice This medication may also interact with the following: Alcohol Certain antibiotics, such as erythromycin, clarithromycin Certain antivirals for HIV or hepatitis Certain medications for cholesterol, such as fenofibrate, gemfibrozil, niacin Certain medications for fungal infections, such as ketoconazole, itraconazole, voriconazole Colchicine Cyclosporine Digoxin Estrogen or progestin hormones Grapefruit juice Rifampin This list may not describe all possible interactions. Give your health care provider a list of all the medicines, herbs, non-prescription drugs, or dietary supplements you use. Also tell them if you smoke, drink alcohol, or use illegal drugs. Some items may interact with your medicine. What should I watch for while using this medication? Visit your care team for regular checks on your progress. Tell your care team if your symptoms do not start to get better or if they get worse. Your care team may tell you to stop taking this medication if you develop muscle problems. If your muscle problems do not go away after stopping this medication, contact your care team. This medication may increase blood sugar. The risk may be higher in patients who already have diabetes. Ask your care team what  you can do to lower your risk of diabetes while on this medication. If you are going to need surgery or other procedure, tell your care team that you are using this medication. Taking this medication is only part of a total heart healthy  program. Ask your care team if there are other changes you can make to improve your overall health. Drinking more than 2 alcoholic drinks every day with this medication can increase the risk of side effects. Talk to your care team if you wish to become pregnant or think you might be pregnant. This medication can cause serious birth defects. Talk to your care team before breastfeeding. Changes to your treatment plan may be needed. What side effects may I notice from receiving this medication? Side effects that you should report to your care team as soon as possible: Allergic reactions--skin rash, itching, hives, swelling of the face, lips, tongue, or throat High blood sugar (hyperglycemia)--increased thirst or amount of urine, unusual weakness or fatigue, blurry vision Liver injury--right upper belly pain, loss of appetite, nausea, light-colored stool, dark yellow or brown urine, yellowing skin or eyes, unusual weakness or fatigue Muscle injury--unusual weakness or fatigue, muscle pain, dark yellow or brown urine, decrease in amount of urine Redness, blistering, peeling, or loosening of the skin, including inside the mouth Side effects that usually do not require medical attention (report to your care team if they continue or are bothersome): Diarrhea Nausea Trouble sleeping Upset stomach This list may not describe all possible side effects. Call your doctor for medical advice about side effects. You may report side effects to FDA at 1-800-FDA-1088. Where should I keep my medication? Keep out of the reach of children and pets. Store at room temperature between 20 and 25 degrees C (68 and 77 degrees F). Get rid of any unused medication after the expiration date. To get rid of medications that are no longer needed or have expired: Take the medication to a medication take-back program. Check with your pharmacy or law enforcement to find a location. If you cannot return the medication, check the  label or package insert to see if the medication should be thrown out in the garbage or flushed down the toilet. If you are not sure, ask your care team. If it is safe to put it in the trash, take the medication out of the container. Mix the medication with cat litter, dirt, coffee grounds, or other unwanted substance. Seal the mixture in a bag or container. Put it in the trash. NOTE: This sheet is a summary. It may not cover all possible information. If you have questions about this medicine, talk to your doctor, pharmacist, or health care provider.  2023 Elsevier/Gold Standard (2022-01-06 00:00:00)   Stroke Prevention Some medical conditions and behaviors can lead to a higher chance of having a stroke. You can help prevent a stroke by eating healthy, exercising, not smoking, and managing any medical conditions you have. Stroke is a leading cause of functional impairment. Primary prevention is particularly important because a majority of strokes are first-time events. Stroke changes the lives of not only those who experience a stroke but also their family and other caregivers. How can this condition affect me? A stroke is a medical emergency and should be treated right away. A stroke can lead to brain damage and can sometimes be life-threatening. If a person gets medical treatment right away, there is a better chance of surviving and recovering from a stroke. What can increase my risk?  The following medical conditions may increase your risk of a stroke: Cardiovascular disease. High blood pressure (hypertension). Diabetes. High cholesterol. Sickle cell disease. Blood clotting disorders (hypercoagulable state). Obesity. Sleep disorders (obstructive sleep apnea). Other risk factors include: Being older than age 30. Having a history of blood clots, stroke, or mini-stroke (transient ischemic attack, TIA). Genetic factors, such as race, ethnicity, or a family history of stroke. Smoking cigarettes  or using other tobacco products. Taking birth control pills, especially if you also use tobacco. Heavy use of alcohol or drugs, especially cocaine and methamphetamine. Physical inactivity. What actions can I take to prevent this? Manage your health conditions High cholesterol levels. Eating a healthy diet is important for preventing high cholesterol. If cholesterol cannot be managed through diet alone, you may need to take medicines. Take any prescribed medicines to control your cholesterol as told by your health care provider. Hypertension. To reduce your risk of stroke, try to keep your blood pressure below 130/80. Eating a healthy diet and exercising regularly are important for controlling blood pressure. If these steps are not enough to manage your blood pressure, you may need to take medicines. Take any prescribed medicines to control hypertension as told by your health care provider. Ask your health care provider if you should monitor your blood pressure at home. Have your blood pressure checked every year, even if your blood pressure is normal. Blood pressure increases with age and some medical conditions. Diabetes. Eating a healthy diet and exercising regularly are important parts of managing your blood sugar (glucose). If your blood sugar cannot be managed through diet and exercise, you may need to take medicines. Take any prescribed medicines to control your diabetes as told by your health care provider. Get evaluated for obstructive sleep apnea. Talk to your health care provider about getting a sleep evaluation if you snore a lot or have excessive sleepiness. Make sure that any other medical conditions you have, such as atrial fibrillation or atherosclerosis, are managed. Nutrition Follow instructions from your health care provider about what to eat or drink to help manage your health condition. These instructions may include: Reducing your daily calorie intake. Limiting how much  salt (sodium) you use to 1,500 milligrams (mg) each day. Using only healthy fats for cooking, such as olive oil, canola oil, or sunflower oil. Eating healthy foods. You can do this by: Choosing foods that are high in fiber, such as whole grains, and fresh fruits and vegetables. Eating at least 5 servings of fruits and vegetables a day. Try to fill one-half of your plate with fruits and vegetables at each meal. Choosing lean protein foods, such as lean cuts of meat, poultry without skin, fish, tofu, beans, and nuts. Eating low-fat dairy products. Avoiding foods that are high in sodium. This can help lower blood pressure. Avoiding foods that have saturated fat, trans fat, and cholesterol. This can help prevent high cholesterol. Avoiding processed and prepared foods. Counting your daily carbohydrate intake.  Lifestyle If you drink alcohol: Limit how much you have to: 0-1 drink a day for women who are not pregnant. 0-2 drinks a day for men. Know how much alcohol is in your drink. In the [place] one drink equals one 12 oz bottle of beer ( ), one 5 oz glass of wine ( ), or one 1 oz glass of hard liquor (81mL). Do not use any products that contain nicotine or tobacco. These products include cigarettes, chewing tobacco, and vaping devices, such as e-cigarettes. If you need help quitting,  ask your health care provider. Avoid secondhand smoke. Do not use drugs. Activity  Try to stay at a healthy weight. Get at least 30 minutes of exercise on most days, such as: Fast walking. Biking. Swimming. Medicines Take over-the-counter and prescription medicines only as told by your health care provider. Aspirin or blood thinners (antiplatelets or anticoagulants) may be recommended to reduce your risk of forming blood clots that can lead to stroke. Avoid taking birth control pills. Talk to your health care provider about the risks of taking birth control pills if: You are over 26 years old. You  smoke. You get very bad headaches. You have had a blood clot. Where to find more information American Stroke Association: www.strokeassociation.org Get help right away if: You or a loved one has any symptoms of a stroke. "BE FAST" is an easy way to remember the main warning signs of a stroke: B - Balance. Signs are dizziness, sudden trouble walking, or loss of balance. E - Eyes. Signs are trouble seeing or a sudden change in vision. F - Face. Signs are sudden weakness or numbness of the face, or the face or eyelid drooping on one side. A - Arms. Signs are weakness or numbness in an arm. This happens suddenly and usually on one side of the body. S - Speech. Signs are sudden trouble speaking, slurred speech, or trouble understanding what people say. T - Time. Time to call emergency services. Write down what time symptoms started. You or a loved one has other signs of a stroke, such as: A sudden, severe headache with no known cause. Nausea or vomiting. Seizure. These symptoms may represent a serious problem that is an emergency. Do not wait to see if the symptoms will go away. Get medical help right away. Call your local emergency services (911 in the [place] Do not drive yourself to the hospital. Summary You can help to prevent a stroke by eating healthy, exercising, not smoking, limiting alcohol intake, and managing any medical conditions you may have. Do not use any products that contain nicotine or tobacco. These include cigarettes, chewing tobacco, and vaping devices, such as e-cigarettes. If you need help quitting, ask your health care provider. Remember "BE FAST" for warning signs of a stroke. Get help right away if you or a loved one has any of these signs. This information is not intended to replace advice given to you by your health care provider. Make sure you discuss any questions you have with your health care provider. Document Revised: 05/18/2020 Document Reviewed:  05/18/2020 Elsevier Patient Education  [place].  Spinal Stenosis  Spinal stenosis is a condition that happens when the spinal canal narrows. The spinal canal is the space between the bones of your spine (vertebrae). This narrowing puts pressure on the spinal cord and nerves that exit the spine and run down the arms or legs. When nerves exiting the spine are pinched, it can cause pain, numbness, or weakness in the arms or legs. Spinal stenosis can affect the vertebrae in the neck, upper back, and lower back. Spinal stenosis can range from mild to severe. What are the causes? This condition is caused by areas of bone pushing into the spinal canal. This condition may be present at birth (congenital), or it may be caused by: Slow breakdown of your vertebrae (spinal degeneration). This usually starts between 32 and 65 years of age. Injury (trauma) to your spine. Previous spinal surgery. Tumors in your spine. Calcium deposits in your spine.  What increases the risk? The following factors may make you more likely to develop this condition: Being older than age 76. Being born with an abnormally shaped spine (congenitalspinal deformity), such as scoliosis. Having arthritis. What are the signs or symptoms? Symptoms of this condition include: Pain in the neck or back that is generally worse with activities, particularly when standing or walking. Numbness, tingling, hot or cold sensations, weakness, or tiredness (fatigue) in your arms or legs. This can happen in one arm or leg, or both. Pain going from the buttock, down the thigh, and to the calf (sciatica). This can happen in one or both legs. Falling frequently. Foot drop. This is when you have trouble lifting the front part of your foot and it drags on the ground when you walk. This can lead to muscle weakness. In more severe cases, you may develop: Problems having a bowel movement or urinating. Difficulty having sex. Loss of feeling in  your legs and inability to walk. Symptoms may come on slowly and get worse over time. In some cases, there are no symptoms. How is this diagnosed? This condition is diagnosed based on your medical history and a physical exam. You may also have tests, such as an X-ray, CT scan, or MRI. How is this treated? Treatment for this condition often focuses on managing your pain and any other symptoms. Treatment may include: Practicing good posture to lessen pressure on your nerves. Exercises to strengthen muscles, build endurance, improve balance, and maintain range of motion. This may include physical therapy to restore movement and strength to your back. Losing weight, if needed. Medicines to reduce inflammation or pain. This may include a medicine that is injected into your spine (steroidinjection). Assistive devices, such as a corset or brace. In some cases, surgery may be needed. The most common procedure is decompression laminectomy. This removes excess bone that puts pressure on your nerve roots. Follow these instructions at home: Managing pain, stiffness, and swelling  Practice good posture. If you were given a brace or a corset, wear it as told by your health care provider. Maintain a healthy weight. Talk with your health care provider if you need help losing weight. If directed, apply heat to the affected area as often as told by your health care provider. Use the heat source that your health care provider recommends, such as a moist heat pack or a heating pad. Place a towel between your skin and the heat source. Leave the heat on for 20-30 minutes. If your skin turns bright red, remove the heat right away to prevent burns. The risk of burns is higher if you cannot feel pain, heat, or cold. Activity Do all exercises and stretches as told by your health care provider. Do not do any activities that cause pain. You may have to avoid lifting. Ask your health care provider how much you can safely  lift. Return to your normal activities as told by your health care provider. Ask your health care provider what activities are safe for you. General instructions Take over-the-counter and prescription medicines only as told by your health care provider. Do not use any products that contain nicotine or tobacco. These products include cigarettes, chewing tobacco, and vaping devices, such as e-cigarettes. If you need help quitting, ask your health care provider. Eat a healthy diet. This includes plenty of fruits and vegetables, whole grains, and low-fat (lean) protein. Where to find more information [employer] of Arthritis and Musculoskeletal and Skin Diseases: www.niams.http://www.myers.net/ Contact a  health care provider if: Your symptoms do not get better or they get worse. You have a fever. Get help right away if: You have new pain or symptoms of severe pain, such as: New or worsening pain in your neck or upper back. Severe pain that cannot be controlled with medicines. A severe headache that gets worse when you stand. You are dizzy. You have vision problems, such as blurred vision or double vision. You have nausea or vomiting. You develop new or worsening numbness or tingling in your back or legs. You lose control of your bowels or bladder. You have pain, redness, swelling, or warmth in your arm or leg. These symptoms may be an emergency. Get help right away. Call 911. Do not wait to see if the symptoms will go away. Do not drive yourself to the hospital. Summary Spinal stenosis is a condition that happens when the spinal canal narrows, putting pressure on the spinal cord or nerves that exit the vertebrae. This condition is caused by areas of bone pushing into the spinal canal. Spinal stenosis can cause numbness, weakness, or pain in the buttocks, neck, back, arms, and legs. This condition is usually diagnosed with your medical history, a physical exam, and tests, such as an X-ray, CT scan,  or MRI. This information is not intended to replace advice given to you by your health care provider. Make sure you discuss any questions you have with your health care provider. Document Revised: 01/11/2022 Document Reviewed: 01/11/2022 Elsevier Patient Education  [place].

## 2022-12-07 ENCOUNTER — Ambulatory Visit (HOSPITAL_COMMUNITY)
Admission: RE | Admit: 2022-12-07 | Discharge: 2022-12-07 | Disposition: A | Discharge status: Home / Self Care | Payer: Medicare Other | Source: Ambulatory Visit | Attending: Nurse Practitioner | Admitting: Nurse Practitioner

## 2022-12-07 ENCOUNTER — Encounter (HOSPITAL_COMMUNITY): Payer: Self-pay

## 2022-12-07 ENCOUNTER — Telehealth: Payer: Self-pay | Admitting: Nurse Practitioner

## 2022-12-07 ENCOUNTER — Telehealth: Payer: Self-pay | Admitting: Pharmacy Technician

## 2022-12-07 DIAGNOSIS — R1030 Lower abdominal pain, unspecified: Secondary | ICD-10-CM | POA: Insufficient documentation

## 2022-12-07 DIAGNOSIS — K5732 Diverticulitis of large intestine without perforation or abscess without bleeding: Secondary | ICD-10-CM | POA: Diagnosis present

## 2022-12-07 MED ORDER — IOHEXOL 300 MG/ML  SOLN
100.0000 mL | Freq: Once | INTRAMUSCULAR | Status: AC | PRN
  Administered 2022-12-07: 100 mL via INTRAVENOUS

## 2022-12-07 MED ORDER — ALPRAZOLAM 0.25 MG PO TABS: ORAL_TABLET | ORAL | 0 refills | Status: DC

## 2022-12-07 MED ORDER — IOHEXOL 9 MG/ML PO SOLN
ORAL | Status: AC
  Filled 2022-12-07: qty 1000

## 2022-12-07 MED ORDER — IOHEXOL 9 MG/ML PO SOLN
1000.0000 mL | ORAL | Status: AC
  Administered 2022-12-07: 1000 mL via ORAL

## 2022-12-07 MED ORDER — SODIUM CHLORIDE (PF) 0.9 % IJ SOLN
INTRAMUSCULAR | Status: AC
  Filled 2022-12-07: qty 50

## 2022-12-07 NOTE — Telephone Encounter (Signed)
Patient Advocate Encounter  Received notification from [facility] that prior authorization for DICYCLOMINE 10MG  is required.   PA submitted on 2.7.24 Key [id number] Status is pending

## 2022-12-07 NOTE — Telephone Encounter (Signed)
I spoke with patient, reviewed her CTAP today did not show any evidence of diverticulitis.  She will complete the 7th day of Augmentin then I told her to discontinue it.  See CTAP notes with Dr. Doyne Keel recommendations.  Patient will be contacted with plans to schedule colonoscopy at [facility] in 1 to 2 months. She can also take IBgard 1 p.o. twice daily for abdominal pain. Patient informed to contact our office if her symptoms worsen

## 2022-12-08 ENCOUNTER — Other Ambulatory Visit: Payer: Self-pay

## 2022-12-08 DIAGNOSIS — K5730 Diverticulosis of large intestine without perforation or abscess without bleeding: Secondary | ICD-10-CM

## 2022-12-08 DIAGNOSIS — K5732 Diverticulitis of large intestine without perforation or abscess without bleeding: Secondary | ICD-10-CM

## 2022-12-08 MED ORDER — NA SULFATE-K SULFATE-MG SULF 17.5-3.13-1.6 GM/177ML PO SOLN: 1.0000 | ORAL | 0 refills | Status: DC

## 2022-12-09 ENCOUNTER — Ambulatory Visit: Payer: Medicare Other | Admitting: Physician Assistant

## 2022-12-09 ENCOUNTER — Ambulatory Visit: Payer: Medicare Other | Admitting: Nurse Practitioner

## 2022-12-09 ENCOUNTER — Other Ambulatory Visit (HOSPITAL_BASED_OUTPATIENT_CLINIC_OR_DEPARTMENT_OTHER): Payer: Medicare Other

## 2022-12-10 LAB — B12 AND FOLATE PANEL
Folate: 10.3 ng/mL (ref >3.0)
Vitamin B-12: 338 pg/mL (ref 232–1245)

## 2022-12-10 LAB — METHYLMALONIC ACID, SERUM: Methylmalonic Acid: 613 nmol/L — ABNORMAL HIGH (ref 0–378)

## 2022-12-12 ENCOUNTER — Telehealth: Payer: Self-pay | Admitting: Neurology

## 2022-12-12 NOTE — Telephone Encounter (Signed)
LMVM for pt to return call about lab results B12 and MMA.

## 2022-12-12 NOTE — Telephone Encounter (Signed)
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 She will have to take supplements throughout her life.  1000-2057mg B12 daily po long term  recheck B12 in 3-4 months  B12 deficiency can cause weakness, fatigue, easy bruising or bleeding,sore tongue, stomach upset, weight loss, and diarrhea or constipation, tingling or numbness to the fingers and toes, difficulty walking, mood changes, depression, memory loss, disorientation and, in severe cases, dementia. Recommend 1000-2000 mcg B12 daily.   An elevated MMA is a very sensitive and specific marker of B12 deficiency. There is evidence that up to 10% of individuals who have normal or low normal B12 values (between 150 and 400 pg/mL) may develop neuropsychiatric sequelae of B12 deficiency without any evidence of megaloblastic anemia. The MMA test can identify this population of functionally B12-deficient individuals. In these patients, oral cyanocobalamin 1-2 mg/day will normalize the MMA and hopefully improve their symptoms

## 2022-12-13 ENCOUNTER — Telehealth: Payer: Self-pay | Admitting: Neurology

## 2022-12-13 ENCOUNTER — Other Ambulatory Visit: Payer: Self-pay | Admitting: Neurology

## 2022-12-13 DIAGNOSIS — E538 Deficiency of other specified B group vitamins: Secondary | ICD-10-CM

## 2022-12-13 NOTE — Telephone Encounter (Signed)
[employer] medicare Josem Kaufmann: [id number] exp. 12/13/22-01/11/23 sent to GI [phone]

## 2022-12-13 NOTE — Addendum Note (Signed)
Addended by: Brandon Melnick on: 12/13/2022 02:42 PM   Modules accepted: Orders

## 2022-12-13 NOTE — Telephone Encounter (Signed)
I called pt. Her b12 is on the lower end and her methylmalonic acid(MMA) is elevated which means she does still have B12 deficiency which can be serious. She will have to take supplements throughout her life.  1000-2020mg B12 daily po long term.  Recheck B12 in 3-4 months.  Pt has some from previous time that she will start taking.  She will come in May/June for recheck on her B12 after she taken 1000-20032m daily.  I relayed that B12 deficiency can cause weakness, fatigue, easy bruising or bleeding,sore tongue, stomach upset, weight loss, and diarrhea or constipation, tingling or numbness to the fingers and toes, difficulty walking, mood changes, depression, memory loss, disorientation and, in severe cases, dementia.  She verbalized understanding of results.

## 2022-12-13 NOTE — Telephone Encounter (Signed)
Yes thanks for th ecatch both are ordered

## 2022-12-13 NOTE — Telephone Encounter (Signed)
I called and LMVM for pt to return call about B12 and MMA testing that was done.

## 2022-12-14 NOTE — Telephone Encounter (Signed)
Patient Advocate Encounter  Prior Authorization for DICYCLOMINE 10MG has been approved.    PA# --- Effective dates: 2.7.24 through 2.7.25

## 2022-12-21 ENCOUNTER — Institutional Professional Consult (permissible substitution): Payer: Medicare Other | Admitting: Neurology

## 2023-01-07 ENCOUNTER — Ambulatory Visit
Admission: RE | Admit: 2023-01-07 | Discharge: 2023-01-07 | Disposition: A | Discharge status: Home / Self Care | Payer: Medicare Other | Source: Ambulatory Visit | Attending: Neurology | Admitting: Neurology

## 2023-01-07 DIAGNOSIS — G959 Disease of spinal cord, unspecified: Secondary | ICD-10-CM

## 2023-01-07 DIAGNOSIS — R2689 Other abnormalities of gait and mobility: Secondary | ICD-10-CM

## 2023-01-07 DIAGNOSIS — G1229 Other motor neuron disease: Secondary | ICD-10-CM

## 2023-01-07 DIAGNOSIS — R202 Paresthesia of skin: Secondary | ICD-10-CM

## 2023-01-07 DIAGNOSIS — Z9181 History of falling: Secondary | ICD-10-CM

## 2023-01-07 DIAGNOSIS — G992 Myelopathy in diseases classified elsewhere: Secondary | ICD-10-CM

## 2023-01-07 DIAGNOSIS — R269 Unspecified abnormalities of gait and mobility: Secondary | ICD-10-CM

## 2023-01-07 DIAGNOSIS — M48062 Spinal stenosis, lumbar region with neurogenic claudication: Secondary | ICD-10-CM

## 2023-01-07 DIAGNOSIS — R29898 Other symptoms and signs involving the musculoskeletal system: Secondary | ICD-10-CM

## 2023-01-07 DIAGNOSIS — R270 Ataxia, unspecified: Secondary | ICD-10-CM

## 2023-01-07 DIAGNOSIS — R292 Abnormal reflex: Secondary | ICD-10-CM

## 2023-01-07 DIAGNOSIS — M6281 Muscle weakness (generalized): Secondary | ICD-10-CM

## 2023-01-07 DIAGNOSIS — R258 Other abnormal involuntary movements: Secondary | ICD-10-CM

## 2023-01-10 ENCOUNTER — Telehealth: Payer: Self-pay | Admitting: *Deleted

## 2023-01-10 ENCOUNTER — Telehealth: Payer: Self-pay | Admitting: Neurology

## 2023-01-10 ENCOUNTER — Encounter (HOSPITAL_COMMUNITY): Payer: Self-pay | Admitting: Gastroenterology

## 2023-01-10 ENCOUNTER — Encounter: Payer: Self-pay | Admitting: Gastroenterology

## 2023-01-10 NOTE — Telephone Encounter (Signed)
Spoke to patient gave MRI results

## 2023-01-10 NOTE — Telephone Encounter (Signed)
Pt is returning call to [facility], who called and left her a message.  Please call pt back

## 2023-01-10 NOTE — Telephone Encounter (Signed)
-----   Message from Melvenia Beam, MD sent at 01/09/2023  6:17 PM EDT ----- MRI of the cervical spine and lumbar spine show multiple levels of moderate to severe degenerative/arthritic changes and most importantly moderate to severe spinal canal stenosis. I have sent her to neurosurgery In the past. I would like her to continue to see neurosurgery. She has seen Dr. Ellene Route in the past and I recommend continuing to see him. We can send him over these updated images for her next appointment thanks

## 2023-01-10 NOTE — Telephone Encounter (Signed)
Spoke to patient gave MRI cervical spine and lumbar spine . Gave Dr Ferdinand Lango  recommendation to continue to see Dr Ellene Route (neurosurgeon) Pt agreed and will call to make a f/u appointment . Pt was worried about long wait for appointment . Informed pt still call and I routed results to Dr Ellene Route and maybe the  wait time will be less. Pt expressed understanding and  Pt thanked me for calling

## 2023-01-17 ENCOUNTER — Ambulatory Visit (HOSPITAL_COMMUNITY): Payer: Medicare Other | Admitting: Anesthesiology

## 2023-01-17 ENCOUNTER — Ambulatory Visit (HOSPITAL_COMMUNITY)
Admission: RE | Admit: 2023-01-17 | Discharge: 2023-01-17 | Disposition: A | Discharge status: Home / Self Care | Payer: Medicare Other | Source: Ambulatory Visit | Attending: Gastroenterology | Admitting: Gastroenterology

## 2023-01-17 ENCOUNTER — Other Ambulatory Visit: Payer: Self-pay

## 2023-01-17 ENCOUNTER — Encounter (HOSPITAL_COMMUNITY): Payer: Self-pay | Admitting: Gastroenterology

## 2023-01-17 ENCOUNTER — Ambulatory Visit (HOSPITAL_BASED_OUTPATIENT_CLINIC_OR_DEPARTMENT_OTHER): Payer: Medicare Other | Admitting: Anesthesiology

## 2023-01-17 ENCOUNTER — Encounter (HOSPITAL_COMMUNITY)
Admission: RE | Disposition: A | Discharge status: Home / Self Care | Payer: Self-pay | Source: Ambulatory Visit | Attending: Gastroenterology

## 2023-01-17 DIAGNOSIS — R1030 Lower abdominal pain, unspecified: Secondary | ICD-10-CM | POA: Insufficient documentation

## 2023-01-17 DIAGNOSIS — K219 Gastro-esophageal reflux disease without esophagitis: Secondary | ICD-10-CM | POA: Insufficient documentation

## 2023-01-17 DIAGNOSIS — Z79899 Other long term (current) drug therapy: Secondary | ICD-10-CM | POA: Insufficient documentation

## 2023-01-17 DIAGNOSIS — Z7982 Long term (current) use of aspirin: Secondary | ICD-10-CM | POA: Diagnosis not present

## 2023-01-17 DIAGNOSIS — K5732 Diverticulitis of large intestine without perforation or abscess without bleeding: Secondary | ICD-10-CM | POA: Diagnosis not present

## 2023-01-17 DIAGNOSIS — K449 Diaphragmatic hernia without obstruction or gangrene: Secondary | ICD-10-CM | POA: Diagnosis not present

## 2023-01-17 DIAGNOSIS — R194 Change in bowel habit: Secondary | ICD-10-CM | POA: Insufficient documentation

## 2023-01-17 DIAGNOSIS — D123 Benign neoplasm of transverse colon: Secondary | ICD-10-CM | POA: Insufficient documentation

## 2023-01-17 DIAGNOSIS — I2510 Atherosclerotic heart disease of native coronary artery without angina pectoris: Secondary | ICD-10-CM | POA: Diagnosis not present

## 2023-01-17 DIAGNOSIS — Z8673 Personal history of transient ischemic attack (TIA), and cerebral infarction without residual deficits: Secondary | ICD-10-CM | POA: Insufficient documentation

## 2023-01-17 DIAGNOSIS — K648 Other hemorrhoids: Secondary | ICD-10-CM | POA: Diagnosis not present

## 2023-01-17 DIAGNOSIS — Z8719 Personal history of other diseases of the digestive system: Secondary | ICD-10-CM | POA: Diagnosis not present

## 2023-01-17 DIAGNOSIS — D126 Benign neoplasm of colon, unspecified: Secondary | ICD-10-CM

## 2023-01-17 DIAGNOSIS — K5730 Diverticulosis of large intestine without perforation or abscess without bleeding: Secondary | ICD-10-CM

## 2023-01-17 DIAGNOSIS — I10 Essential (primary) hypertension: Secondary | ICD-10-CM | POA: Insufficient documentation

## 2023-01-17 HISTORY — PX: POLYPECTOMY: SHX5525

## 2023-01-17 HISTORY — PX: COLONOSCOPY WITH PROPOFOL: SHX5780

## 2023-01-17 SURGERY — COLONOSCOPY WITH PROPOFOL
Anesthesia: Monitor Anesthesia Care

## 2023-01-17 MED ORDER — PROPOFOL 500 MG/50ML IV EMUL
INTRAVENOUS | Status: AC
  Filled 2023-01-17: qty 50

## 2023-01-17 MED ORDER — SODIUM CHLORIDE 0.9 % IV SOLN: INTRAVENOUS | Status: DC

## 2023-01-17 MED ORDER — PROPOFOL 10 MG/ML IV BOLUS
INTRAVENOUS | Status: AC
  Filled 2023-01-17: qty 20

## 2023-01-17 MED ORDER — LACTATED RINGERS IV SOLN: INTRAVENOUS | Status: DC

## 2023-01-17 MED ORDER — LIDOCAINE HCL (CARDIAC) PF 100 MG/5ML IV SOSY
PREFILLED_SYRINGE | INTRAVENOUS | Status: DC | PRN
  Administered 2023-01-17: 60 mg via INTRAVENOUS

## 2023-01-17 MED ORDER — PROPOFOL 10 MG/ML IV BOLUS
INTRAVENOUS | Status: DC | PRN
  Administered 2023-01-17: 60 mg via INTRAVENOUS
  Administered 2023-01-17: 20 mg via INTRAVENOUS

## 2023-01-17 MED ORDER — PROPOFOL 500 MG/50ML IV EMUL
INTRAVENOUS | Status: DC | PRN
  Administered 2023-01-17: 100 ug/kg/min via INTRAVENOUS

## 2023-01-17 SURGICAL SUPPLY — 22 items

## 2023-01-17 NOTE — Anesthesia Preprocedure Evaluation (Addendum)
Anesthesia Evaluation  Patient identified by MRN, date of birth, ID band Patient awake    Reviewed: Allergy & Precautions, NPO status , Patient's Chart, lab work & pertinent test results, reviewed documented beta blocker date and time   Airway Mallampati: II  TM Distance: >3 FB Neck ROM: Full    Dental  (+) Dental Advisory Given, Teeth Intact   Pulmonary neg pulmonary ROS   Pulmonary exam normal breath sounds clear to auscultation       Cardiovascular hypertension, Pt. on medications + CAD  Normal cardiovascular exam+ dysrhythmias (2nd degree AV block)  Rhythm:Regular Rate:Normal  TTE 2023  1. Left ventricular ejection fraction, by estimation, is 55 to 60%. The  left ventricle has normal function. The left ventricle has no regional  wall motion abnormalities. Left ventricular diastolic parameters are  consistent with Grade I diastolic  dysfunction (impaired relaxation).   2. Right ventricular systolic function is normal. The right ventricular  size is normal. There is normal pulmonary artery systolic pressure.   3. The mitral valve is grossly normal. Mild mitral valve regurgitation.  No evidence of mitral stenosis.   4. The aortic valve is tricuspid. There is mild calcification of the  aortic valve. Aortic valve regurgitation is not visualized. No aortic  stenosis is present.   Cath 2022  Prox RCA to Mid RCA lesion is 30% stenosed.  RPDA lesion is 30% stenosed.  1st Mrg lesion is 50% stenosed.  Mid LAD lesion is 50% stenosed.  The left ventricular systolic function is normal.  LV end diastolic pressure is normal.  The left ventricular ejection fraction is 55-65% by visual estimate.  There is no mitral valve regurgitation.   1. Moderate non-obstructive disease in the mid LAD 2. Mild non-obstructive disease in the Circumflex and dominant RCA 3. Normal LV systolic function 4. Normal LV filling pressure.       Neuro/Psych CVA  negative psych ROS   GI/Hepatic Neg liver ROS, hiatal hernia,GERD  ,,  Endo/Other  negative endocrine ROS    Renal/GU negative Renal ROS  negative genitourinary   Musculoskeletal  (+) Arthritis ,    Abdominal   Peds  Hematology negative hematology ROS (+)   Anesthesia Other Findings   Reproductive/Obstetrics                             Anesthesia Physical Anesthesia Plan  ASA: 3  Anesthesia Plan: MAC   Post-op Pain Management:    Induction: Intravenous  PONV Risk Score and Plan: Propofol infusion and Treatment may vary due to age or medical condition  Airway Management Planned: Natural Airway  Additional Equipment:   Intra-op Plan:   Post-operative Plan:   Informed Consent: I have reviewed the patients History and Physical, chart, labs and discussed the procedure including the risks, benefits and alternatives for the proposed anesthesia with the patient or authorized representative who has indicated his/her understanding and acceptance.     Dental advisory given  Plan Discussed with: CRNA  Anesthesia Plan Comments:        Anesthesia Quick Evaluation

## 2023-01-17 NOTE — Anesthesia Postprocedure Evaluation (Signed)
Anesthesia Post Note  Patient: Kayla Terry  Procedure(s) Performed: COLONOSCOPY WITH PROPOFOL POLYPECTOMY     Patient location during evaluation: Endoscopy Anesthesia Type: MAC Level of consciousness: awake and alert Pain management: pain level controlled Vital Signs Assessment: post-procedure vital signs reviewed and stable Respiratory status: spontaneous breathing, nonlabored ventilation, respiratory function stable and patient connected to nasal cannula oxygen Cardiovascular status: blood pressure returned to baseline and stable Postop Assessment: no apparent nausea or vomiting Anesthetic complications: no  No notable events documented.  Last Vitals:  Vitals:   01/17/23 0910 01/17/23 0925  BP: (!) 178/95 (!) 164/82  Pulse: 63   Resp: 17   Temp:    SpO2: 100%     Last Pain:  Vitals:   01/17/23 0925  TempSrc:   PainSc: 0-No pain                 Jettie Lazare L Meridian Scherger

## 2023-01-17 NOTE — H&P (Signed)
[facility] History and Physical   Primary Care Physician:  Kayla Sacramento, Terry   Reason for Procedure:   Diverticulitis, altered bowel habits   Plan:    Colonoscopy     HPI: Kayla Terry is Terry 77 y.o. female  here for colonoscopy to further evaluate history of diverticulitis in recent months. Altered bowel habits, occasional lower abdominal pain. Admitted in December with sigmoid diverticulitis with Terry contained perforation, managed conservatively. 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 She states recovering okay. Still has some altered bowel habits and occasional discomfort in lower abdomen. Follow up CT scan 12/07/22 showed resolution of diverticulitis. Her last colonoscopy was in 2015.  Otherwise feels well without any cardiopulmonary symptoms. She wishes to proceed today after discussion of the procedure and anesthesia.  I have discussed risks / benefits of anesthesia and endoscopic procedure with Kayla Terry and they wish to proceed with the exams as outlined today.    Past Medical History:  Diagnosis Date   Allergy    B12 deficiency 04/2020   Cataract    Coronary artery disease    COVID-19 04/22/2021   Diverticulitis    10/19/2022   Fatty liver    GERD (gastroesophageal reflux disease)    Herpes zoster without complications    Hiatal hernia    Hypertension    Mild mitral regurgitation 2013   Mobitz type 2 second degree atrioventricular block    Pectus excavatum    pectus excavatum deformity resulting in mass effect on the RV by CT 03/2020   Pulmonary nodule    Shingles    Stroke ([place])    10/19/2022    Past Surgical History:  Procedure Laterality Date   ABDOMINAL HYSTERECTOMY  02/1984   BACK SURGERY     CATARACT EXTRACTION, BILATERAL     LEFT HEART CATH AND CORONARY ANGIOGRAPHY N/Terry 04/22/2021   Procedure: LEFT HEART CATH AND CORONARY ANGIOGRAPHY;  Surgeon: Kayla Blanks, Terry;  Location: [facility];  Service: Cardiovascular;  Laterality: N/Terry;   LUMBAR LAMINECTOMY     OOPHORECTOMY  02/29/1984   unilateral    Prior to Admission medications   Medication Sig Start Date End Date Taking? Authorizing Provider  amLODipine (NORVASC) 5 MG Terry Take 5 mg by mouth daily as needed (High BP).   Yes Provider, Historical, Terry  aspirin EC 81 MG Terry Take 1 Terry (81 mg total) by mouth daily. Swallow whole. 10/22/22  Yes Kayla Terry  cyanocobalamin (VITAMIN B12) 1000 MCG Terry Take 1,000 mcg by mouth daily.   Yes Provider, Historical, Terry  famotidine (PEPCID) 20 MG Terry Take 20 mg by mouth daily as needed for heartburn or indigestion.   Yes Provider, Historical, Terry  gabapentin (NEURONTIN) 300 MG capsule Take 300 mg by mouth at bedtime. 07/18/22  Yes Provider, Historical, Terry  Na Sulfate-K Sulfate-Mg Sulf (SUPREP BOWEL PREP KIT) 17.5-3.13-1.6 GM/177ML SOLN Take 1 kit by mouth as directed. 12/08/22  Yes Kayla Terry  traZODone (DESYREL) 100 MG Terry Take 100 mg by mouth at bedtime. 03/02/22  Yes Provider, Historical, Terry  zolpidem (AMBIEN) 10 MG Terry Take 10 mg by mouth at bedtime.   Yes Provider, Historical, Terry  Kayla Terry Take 1-2 tabs (0.25mg -0.50mg ) 30-60 minutes before procedure. May repeat if needed.Do not drive. Patient not taking: Reported on 01/12/2023 12/07/22   Kayla Beam, Terry  amoxicillin-clavulanate (AUGMENTIN) 875-125 MG Terry  Take 1 Terry by mouth every 12 (twelve) hours. Patient not taking: Reported on 01/12/2023 11/08/22   Kayla Payor Terry, Kayla Terry  atorvastatin (LIPITOR) 40 MG Terry Take 1 Terry (40 mg total) by mouth every evening. Patient not taking: Reported on 01/12/2023 10/21/22   Kayla Iba, Terry  dicyclomine (BENTYL) 10 MG capsule Take 1 capsule (10 mg total) by mouth every 8 (eight) hours. For abdominal pain/cramping Patient not taking: Reported on 01/12/2023 11/15/22   Kayla Flock, Terry  losartan (COZAAR) 50 MG  Terry Take 1 Terry (50 mg total) by mouth daily. Patient taking differently: Take 50 mg by mouth daily as needed (High BP). 10/22/22   Kayla Terry  ondansetron (ZOFRAN) 4 MG Terry Take 1 Terry (4 mg total) by mouth every 6 (six) hours as needed for nausea. Patient not taking: Reported on 01/12/2023 10/21/22   Kayla Iba, Terry  Peppermint Oil (IBGARD) 90 MG CPCR Take 90 mg by mouth daily as needed (IBS).    Provider, Historical, Terry  Propylene Glycol (SYSTANE BALANCE) 0.6 % SOLN Place 1 drop into both eyes daily as needed (dry eye).    Provider, Historical, Terry    Current Facility-Administered Medications  Medication Dose Route Frequency Provider Last Rate Last Admin   0.9 %  sodium chloride infusion   Intravenous Continuous Kayla Terry, Kayla Raspberry, Terry       lactated ringers infusion   Intravenous Continuous Kayla Terry, Kayla Raspberry, Terry 50 mL/hr at 01/17/23 0715 New Bag at 01/17/23 0715    Allergies as of 12/08/2022 - Review Complete 12/07/2022  Allergen Reaction Noted   Escitalopram Nausea Only and Other (See Comments) 12/02/2020   Pantoprazole Other (See Comments) 12/02/2020   Naproxen sodium Anxiety 12/24/2011    Family History  Problem Relation Age of Onset   Cancer Mother    Leukemia Mother    Lung cancer Father    Multiple myeloma Father    Multiple sclerosis Sister    Alcohol abuse Brother    Multiple myeloma Brother    Colon cancer Neg Hx    Colon polyps Neg Hx    Stomach cancer Neg Hx    Esophageal cancer Neg Hx    Neuropathy Neg Hx    Stroke Neg Hx     Social History   Socioeconomic History   Marital status: Widowed    Spouse name: Not on file   Number of children: Not on file   Years of education: Not on file   Highest education level: Not on file  Occupational History   Not on file  Tobacco Use   Smoking status: Never   Smokeless tobacco: Never  Vaping Use   Vaping Use: Never used  Substance and Sexual Activity   Alcohol use: No   Drug  use: No   Sexual activity: Not on file  Other Topics Concern   Not on file  Social History Narrative   Lives alone    Her husband passed Feb 2021   Caffeine: maybe 1 cup/day, mostly water    Social Determinants of Health   Financial Resource Strain: Not on file  Food Insecurity: No Food Insecurity (10/19/2022)   Hunger Vital Sign    Worried About Running Out of Food in the Last Year: Never true    Ran Out of Food in the Last Year: Never true  Transportation Needs: No Transportation Needs (10/19/2022)   PRAPARE - Hydrologist (Medical): No  Lack of Transportation (Non-Medical): No  Physical Activity: Not on file  Stress: Not on file  Social Connections: Not on file  Intimate Partner Violence: Not At Risk (10/19/2022)   Humiliation, Afraid, Rape, and Kick questionnaire    Fear of Current or Ex-Partner: No    Emotionally Abused: No    Physically Abused: No    Sexually Abused: No    Review of Systems: All other review of systems negative except as mentioned in the HPI.  Physical Exam: Vital signs BP (!) 162/86   Pulse 72   Temp (!) 97 F (36.1 C) (Temporal)   Resp 18   Ht 5\' 3"  (1.6 m)   Wt 61.8 kg   SpO2 98%   BMI 24.13 kg/m   General:   Alert,  Well-developed, pleasant and cooperative in NAD Lungs:  Clear throughout to auscultation.   Heart:  Regular rate and rhythm Abdomen:  Soft, nontender and nondistended.   Neuro/Psych:  Alert and cooperative. Normal mood and affect. Terry and O x 3  Jolly Mango, Terry [facility]

## 2023-01-17 NOTE — Op Note (Addendum)
[facility] Patient Name: Kayla Terry Procedure Date: 01/17/2023 MRN: [id number] Attending MD: Carlota Raspberry. Havery Moros , MD, [id number] Date of Birth: [birth year] CSN: [id number] Age: 77 Admit Type: Inpatient Procedure:                Colonoscopy Indications:              history of diverticulitis - altered bowel habits,                            lower abdominal pain. Last colonoscopy 2015 Providers:                Carlota Raspberry. Havery Moros, MD, Burtis Junes, RN, Darliss Cheney, Technician Referring MD:              Medicines:                Monitored Anesthesia Care Complications:            No immediate complications. Estimated blood loss:                            Minimal. Estimated Blood Loss:     Estimated blood loss was minimal. Procedure:                Pre-Anesthesia Assessment:                           - Prior to the procedure, a History and Physical                            was performed, and patient medications and                            allergies were reviewed. The patient's tolerance of                            previous anesthesia was also reviewed. The risks                            and benefits of the procedure and the sedation                            options and risks were discussed with the patient.                            All questions were answered, and informed consent                            was obtained. Prior Anticoagulants: The patient has                            taken no anticoagulant or antiplatelet agents. ASA  Grade Assessment: III - A patient with severe                            systemic disease. After reviewing the risks and                            benefits, the patient was deemed in satisfactory                            condition to undergo the procedure.                           After obtaining informed consent, the colonoscope                            was passed  under direct vision. Throughout the                            procedure, the patient's blood pressure, pulse, and                            oxygen saturations were monitored continuously. The                            [id number]-HQ190L ([id number]) Olympus colonoscope was                            introduced through the anus and advanced to the the                            terminal ileum, with identification of the                            appendiceal orifice and IC valve. The colonoscopy                            was technically difficult and complex due to                            restricted mobility of the colon. The patient                            tolerated the procedure well. The quality of the                            bowel preparation was adequate. The terminal ileum,                            ileocecal valve, appendiceal orifice, and rectum                            were photographed. Scope In: 8:35:05 AM Scope Out: 8:54:57 AM Scope Withdrawal Time: 0 hours 10 minutes 38 seconds  Total Procedure Duration: 0 hours 19  minutes 52 seconds  Findings:      The perianal and digital rectal examinations were normal.      The terminal ileum appeared normal.      A few small-mouthed diverticula were found in the transverse colon and       ascending colon.      A 3 mm polyp was found in the transverse colon. The polyp was sessile.       The polyp was removed with a cold snare. Resection and retrieval were       complete.      Multiple small-mouthed diverticula were found in the sigmoid colon. The       rectosigmoid and distal sigmoid colon had significant restricted       mobility and luminal narrowing associated with this. Pediatric       colonoscope used for this exam to traverse the area utilizing water       immersion technique and minimal air.      Internal hemorrhoids were found. The hemorrhoids were small.      The exam was otherwise without abnormality. Of note due to small  size of       rectum, retroflexed views not obtained. Impression:               - The examined portion of the ileum was normal.                           - Diverticulosis in the transverse colon and in the                            ascending colon.                           - One 3 mm polyp in the transverse colon, removed                            with a cold snare. Resected and retrieved.                           - Diverticulosis in the sigmoid colon.                           - Internal hemorrhoids.                           - The examination was otherwise normal. Moderate Sedation:      No moderate sedation, case performed with MAC Recommendation:           - Patient has a contact number available for                            emergencies. The signs and symptoms of potential                            delayed complications were discussed with the                            patient. Return to normal activities tomorrow.  Written discharge instructions were provided to the                            patient.                           - Resume previous diet.                           - Continue present medications.                           - Recommend Miralax daily to keep stools soft /                            loose, titrate up or down as needed                           - Contact us if recurrent symptoms for                            diverticulitis                           - No further surveillance colonoscopy recommended                            due to age (polyp likely adenomatous - would not be                            due for 7 years, at which time she will be 77 years                            old). I think risks of further colonoscopy outweigh                            the benefits given luminal narrowing / diverticular                            disease Procedure Code(s):        --- Professional ---                           [phone],  Colonoscopy, flexible; with removal of                            tumor(s), polyp(s), or other lesion(s) by snare                            technique Diagnosis Code(s):        --- Professional ---                           [id number], Other hemorrhoids  D12.3, Benign neoplasm of transverse colon (hepatic                            flexure or splenic flexure)                           K57.32, Diverticulitis of large intestine without                            perforation or abscess without bleeding                           K57.30, Diverticulosis of large intestine without                            perforation or abscess without bleeding CPT copyright 2022 American Medical Association. All rights reserved. The codes documented in this report are preliminary and upon coder review may  be revised to meet current compliance requirements. Remo Lipps P. Monay Houlton, MD 01/17/2023 9:04:02 AM This report has been signed electronically. Number of Addenda: 0

## 2023-01-17 NOTE — Discharge Instructions (Signed)
YOU HAD AN ENDOSCOPIC PROCEDURE TODAY: Refer to the procedure report and other information in the discharge instructions given to you for any specific questions about what was found during the examination. If this information does not answer your questions, please call [facility] office at [phone] to clarify.  ° °YOU SHOULD EXPECT: Some feelings of bloating in the abdomen. Passage of more gas than usual. Walking can help get rid of the air that was put into your GI tract during the procedure and reduce the bloating. If you had a lower endoscopy (such as a colonoscopy or flexible sigmoidoscopy) you may notice spotting of blood in your stool or on the toilet paper. Some abdominal soreness may be present for a day or two, also. ° °DIET: Your first meal following the procedure should be a light meal and then it is ok to progress to your normal diet. A half-sandwich or bowl of soup is an example of a good first meal. Heavy or fried foods are harder to digest and may make you feel nauseous or bloated. Drink plenty of fluids but you should avoid alcoholic beverages for 24 hours. If you had a esophageal dilation, please see attached instructions for diet.   ° °ACTIVITY: Your care partner should take you home directly after the procedure. You should plan to take it easy, moving slowly for the rest of the day. You can resume normal activity the day after the procedure however YOU SHOULD NOT DRIVE, use power tools, machinery or perform tasks that involve climbing or major physical exertion for 24 hours (because of the sedation medicines used during the test).  ° °SYMPTOMS TO REPORT IMMEDIATELY: °A gastroenterologist can be reached at any hour. Please call [phone]  for any of the following symptoms:  °Following lower endoscopy (colonoscopy, flexible sigmoidoscopy) °Excessive amounts of blood in the stool  °Significant tenderness, worsening of abdominal pains  °Swelling of the abdomen that is new, acute  °Fever of 100° or  higher  °Following upper endoscopy (EGD, EUS, ERCP, esophageal dilation) °Vomiting of blood or coffee ground material  °New, significant abdominal pain  °New, significant chest pain or pain under the shoulder blades  °Painful or persistently difficult swallowing  °New shortness of breath  °Black, tarry-looking or red, bloody stools ° °FOLLOW UP:  °If any biopsies were taken you will be contacted by phone or by letter within the next 1-3 weeks. Call [phone]  if you have not heard about the biopsies in 3 weeks.  °Please also call with any specific questions about appointments or follow up tests. ° °

## 2023-01-17 NOTE — Progress Notes (Signed)
Pt dressed and awaiting ride

## 2023-01-17 NOTE — Transfer of Care (Signed)
Immediate Anesthesia Transfer of Care Note  Patient: Kayla Terry  Procedure(s) Performed: COLONOSCOPY WITH PROPOFOL POLYPECTOMY  Patient Location: PACU and Endoscopy Unit  Anesthesia Type:MAC  Level of Consciousness: awake, alert , oriented, and patient cooperative  Airway & Oxygen Therapy: Patient Spontanous Breathing and Patient connected to nasal cannula oxygen  Post-op Assessment: Report given to RN, Post -op Vital signs reviewed and stable, and Patient moving all extremities  Post vital signs: Reviewed and stable  Last Vitals:  Vitals Value Taken Time  BP 155/92 01/17/23 0903  Temp    Pulse 57 01/17/23 0905  Resp 19 01/17/23 0905  SpO2 99 % 01/17/23 0905  Vitals shown include unvalidated device data.  Last Pain:  Vitals:   01/17/23 0709  TempSrc: Temporal  PainSc: 0-No pain         Complications: No notable events documented.

## 2023-01-18 ENCOUNTER — Encounter: Payer: Self-pay | Admitting: Gastroenterology

## 2023-01-18 LAB — SURGICAL PATHOLOGY

## 2023-01-25 ENCOUNTER — Telehealth: Payer: Self-pay

## 2023-01-25 NOTE — Telephone Encounter (Signed)
Lm on vm for pt to return call. 

## 2023-01-25 NOTE — Telephone Encounter (Signed)
Patient had colonoscopy on 01-17-23 with Dr Havery Moros.  She said since the procedure she has had "extreme tightness and feels like her abdomen is going to burst."  Please advise.

## 2023-01-26 NOTE — Telephone Encounter (Signed)
Pt returned call this morning. Patient states that she has had "abdominal tightness" before the procedure and though the Colonoscopy would help with her symptoms. I explained to patient that a colonoscopy was used a diagnostic tool to see if they can find a source of her symptoms. Patient tried IB Gard with no relief. Patient is not passing much gas, but she does have a BM daily. Patient describes her stools as "mushy". I told pt that she has a narrowing in her colon so she really should be taking the Miralax to prevent constipation. I advised patient to take 1 Gas-X pill twice a day and see if this helps her symptoms. Pt has been advised to call back if no improvement or if she has worsening symptoms (not passing any gas or stool). Pt verbalized understanding and had no concerns at the end of the call.

## 2023-02-24 ENCOUNTER — Telehealth: Payer: Self-pay | Admitting: Gastroenterology

## 2023-02-24 MED ORDER — AMOXICILLIN-POT CLAVULANATE 875-125 MG PO TABS: 1.0000 | ORAL_TABLET | Freq: Two times a day (BID) | ORAL | 0 refills | Status: AC

## 2023-02-24 NOTE — Telephone Encounter (Signed)
Patient called states she had a colonoscopy done a few weeks ago and was dx with Diverticulitis, since then she has been taking Gas X to help her with her bowels but lately her lower abdomin area has been feeling really tight. In the past Dr. Adela Lank had her on an antibiotic and she would like to know if he can order it again for her.

## 2023-02-24 NOTE — Telephone Encounter (Signed)
Called and spoke with patient. She reports that she has been having lower abdominal pain since the beginning of the week. Pt reports that it is similar to the pain that she had when she had diverticulitis, but it's more intense. Patient denies any fever or chills. Patient states that she has been taking Gas-X as recommended and that has helped her have regular BMs. Patient denies any loose stools, they are formed but soft. Pt wants to know if she needs to be retreated for diverticulitis again. Please advise, thanks.

## 2023-02-24 NOTE — Telephone Encounter (Signed)
Up to her - if she feels this is very consistent with her prior diverticulitis symptoms we can empirically treat her, she had a prolonged course a few months ago. Can give Augmentin BID for 10 days if she would like. Recommend she stay on Miralax to keep stools soft. Thanks

## 2023-02-24 NOTE — Telephone Encounter (Signed)
Called patient on her home phone, can leave a detailed vm on home phone. I left patient a detailed vm on her home phone letting her know that Augmentin has been sent to [facility] pharmacy. Pt has been advised to continue Miralax to keep stool soft. I advised patient to call back with any questions or concerns.

## 2023-04-10 ENCOUNTER — Emergency Department (HOSPITAL_COMMUNITY): Payer: Medicare Other

## 2023-04-10 ENCOUNTER — Emergency Department (HOSPITAL_COMMUNITY)
Admission: EM | Admit: 2023-04-10 | Discharge: 2023-04-10 | Disposition: A | Discharge status: Home / Self Care | Payer: Medicare Other | Attending: Emergency Medicine | Admitting: Emergency Medicine

## 2023-04-10 ENCOUNTER — Encounter (HOSPITAL_COMMUNITY): Payer: Self-pay | Admitting: *Deleted

## 2023-04-10 ENCOUNTER — Other Ambulatory Visit: Payer: Self-pay

## 2023-04-10 DIAGNOSIS — I10 Essential (primary) hypertension: Secondary | ICD-10-CM | POA: Diagnosis not present

## 2023-04-10 DIAGNOSIS — I2510 Atherosclerotic heart disease of native coronary artery without angina pectoris: Secondary | ICD-10-CM | POA: Insufficient documentation

## 2023-04-10 DIAGNOSIS — Z7982 Long term (current) use of aspirin: Secondary | ICD-10-CM | POA: Insufficient documentation

## 2023-04-10 DIAGNOSIS — R1032 Left lower quadrant pain: Secondary | ICD-10-CM | POA: Insufficient documentation

## 2023-04-10 DIAGNOSIS — R2681 Unsteadiness on feet: Secondary | ICD-10-CM | POA: Diagnosis not present

## 2023-04-10 DIAGNOSIS — R110 Nausea: Secondary | ICD-10-CM | POA: Diagnosis not present

## 2023-04-10 DIAGNOSIS — Z79899 Other long term (current) drug therapy: Secondary | ICD-10-CM | POA: Diagnosis not present

## 2023-04-10 DIAGNOSIS — R5381 Other malaise: Secondary | ICD-10-CM | POA: Insufficient documentation

## 2023-04-10 DIAGNOSIS — R531 Weakness: Secondary | ICD-10-CM | POA: Diagnosis present

## 2023-04-10 DIAGNOSIS — R2689 Other abnormalities of gait and mobility: Secondary | ICD-10-CM | POA: Diagnosis not present

## 2023-04-10 LAB — CBC WITH DIFFERENTIAL/PLATELET
Abs Immature Granulocytes: 0.03 10*3/uL (ref 0.00–0.07)
Basophils Absolute: 0 10*3/uL (ref 0.0–0.1)
Basophils Relative: 0 %
Eosinophils Absolute: 0.1 10*3/uL (ref 0.0–0.5)
Eosinophils Relative: 2 %
HCT: 36.9 % (ref 36.0–46.0)
Hemoglobin: 12 g/dL (ref 12.0–15.0)
Immature Granulocytes: 0 %
Lymphocytes Relative: 14 %
Lymphs Abs: 1 10*3/uL (ref 0.7–4.0)
MCH: 28.4 pg (ref 26.0–34.0)
MCHC: 32.5 g/dL (ref 30.0–36.0)
MCV: 87.2 fL (ref 80.0–100.0)
Monocytes Absolute: 0.6 10*3/uL (ref 0.1–1.0)
Monocytes Relative: 9 %
Neutro Abs: 5.1 10*3/uL (ref 1.7–7.7)
Neutrophils Relative %: 75 %
Platelets: 294 10*3/uL (ref 150–400)
RBC: 4.23 MIL/uL (ref 3.87–5.11)
RDW: 13 % (ref 11.5–15.5)
WBC: 6.9 10*3/uL (ref 4.0–10.5)
nRBC: 0 % (ref 0.0–0.2)

## 2023-04-10 LAB — URINALYSIS, ROUTINE W REFLEX MICROSCOPIC
Bilirubin Urine: NEGATIVE
Glucose, UA: NEGATIVE mg/dL
Hgb urine dipstick: NEGATIVE
Ketones, ur: NEGATIVE mg/dL
Leukocytes,Ua: NEGATIVE
Nitrite: NEGATIVE
Protein, ur: NEGATIVE mg/dL
Specific Gravity, Urine: 1.004 — ABNORMAL LOW (ref 1.005–1.030)
pH: 6 (ref 5.0–8.0)

## 2023-04-10 LAB — TROPONIN I (HIGH SENSITIVITY): Troponin I (High Sensitivity): 3 ng/L (ref <18)

## 2023-04-10 LAB — COMPREHENSIVE METABOLIC PANEL
ALT: 9 U/L (ref 0–44)
AST: 12 U/L — ABNORMAL LOW (ref 15–41)
Albumin: 3.4 g/dL — ABNORMAL LOW (ref 3.5–5.0)
Alkaline Phosphatase: 89 U/L (ref 38–126)
Anion gap: 9 (ref 5–15)
BUN: 11 mg/dL (ref 8–23)
CO2: 27 mmol/L (ref 22–32)
Calcium: 9.1 mg/dL (ref 8.9–10.3)
Chloride: 100 mmol/L (ref 98–111)
Creatinine, Ser: 0.82 mg/dL (ref 0.44–1.00)
GFR, Estimated: >60 mL/min (ref >60)
Glucose, Bld: 93 mg/dL (ref 70–99)
Potassium: 3.7 mmol/L (ref 3.5–5.1)
Sodium: 136 mmol/L (ref 135–145)
Total Bilirubin: 0.4 mg/dL (ref 0.3–1.2)
Total Protein: 7 g/dL (ref 6.5–8.1)

## 2023-04-10 LAB — LIPASE, BLOOD: Lipase: 29 U/L (ref 11–51)

## 2023-04-10 MED ORDER — HYDROCODONE-ACETAMINOPHEN 5-325 MG PO TABS
1.0000 | ORAL_TABLET | Freq: Once | ORAL | Status: AC
  Administered 2023-04-10: 1 via ORAL
  Filled 2023-04-10: qty 1

## 2023-04-10 MED ORDER — ONDANSETRON HCL 4 MG/2ML IJ SOLN
4.0000 mg | Freq: Once | INTRAMUSCULAR | Status: DC
  Filled 2023-04-10: qty 2

## 2023-04-10 MED ORDER — LACTATED RINGERS IV BOLUS
1000.0000 mL | Freq: Once | INTRAVENOUS | Status: AC
  Administered 2023-04-10: 1000 mL via INTRAVENOUS

## 2023-04-10 MED ORDER — SODIUM CHLORIDE 0.9 % IV BOLUS
500.0000 mL | Freq: Once | INTRAVENOUS | Status: AC
  Administered 2023-04-10: 500 mL via INTRAVENOUS

## 2023-04-10 MED ORDER — IOHEXOL 300 MG/ML  SOLN
100.0000 mL | Freq: Once | INTRAMUSCULAR | Status: AC | PRN
  Administered 2023-04-10: 100 mL via INTRAVENOUS

## 2023-04-10 MED ORDER — MORPHINE SULFATE (PF) 4 MG/ML IV SOLN: 2.0000 mg | Freq: Once | INTRAVENOUS | Status: DC

## 2023-04-10 NOTE — ED Triage Notes (Signed)
Pt in from home c/o generalized weakness onset x 1 mth with vision changes that pt is unable to describe, pt states, "it is just off since my Stroke in December", pt denies slurred speech, pt c/o bil arm, leg, and groin pain, A&O x4

## 2023-04-10 NOTE — ED Notes (Signed)
CSW updated that PT is recommending HH PT for pt at D/C. CSW spoke with pt who states that she is interested in [facility] PT/OT being set up. Pt does not have a preference in [facility] agency. CSW spoke to [place] with [facility] who accepts referral for [facility] PT/OT. CSW requested that MD place [facility] orders. TOC signing off.

## 2023-04-10 NOTE — Evaluation (Signed)
Physical Therapy Evaluation Patient Details Name: Kayla Terry MRN: [id number] DOB: July 03, 1946 Today's Date: 04/10/2023  History of Present Illness  Pt in from home c/o generalized weakness onset x 1 mth with vision changes that pt is unable to describe, pt states, "it is just off since my Stroke in December", pt denies slurred speech, pt c/o bil arm, leg, and groin pain, A&O x4   Clinical Impression  Patient functioning near baseline for functional mobility and gait demonstrating good return for bed mobility,  transferring to/from chair and commode in bathroom and walking in room, hallways without loss of balance.  Patient mostly c/o pain/discomfort in groin area that limits her community activities, encouraged patient to have family members check on her daily and follow up with HHPT for strengthening.  Plan:  Patient discharged from physical therapy to care of nursing for ambulation daily as tolerated for length of stay.         Recommendations for follow up therapy are one component of a multi-disciplinary discharge planning process, led by the attending physician.  Recommendations may be updated based on patient status, additional functional criteria and insurance authorization.  Follow Up Recommendations       Assistance Recommended at Discharge PRN  Patient can return home with the following  Assistance with cooking/housework;A little help with bathing/dressing/bathroom    Equipment Recommendations None recommended by PT  Recommendations for Other Services       Functional Status Assessment Patient has had a recent decline in their functional status and/or demonstrates limited ability to make significant improvements in function in a reasonable and predictable amount of time     Precautions / Restrictions Precautions Precautions: None Restrictions Weight Bearing Restrictions: No      Mobility  Bed Mobility Overal bed mobility: Modified Independent              General bed mobility comments: slightly labored movement    Transfers Overall transfer level: Modified independent Equipment used: None               General transfer comment: good return for transferring to chair and to/from commode in  bathroom    Ambulation/Gait Ambulation/Gait assistance: Modified independent (Device/Increase time), Supervision Gait Distance (Feet): 120 Feet Assistive device: None Gait Pattern/deviations: Decreased step length - right, Decreased step length - left, Decreased stride length Gait velocity: decreased     General Gait Details: slightly labored cadence without loss of balance and good return for ambulating in room/hallways  Stairs            Wheelchair Mobility    Modified Rankin (Stroke Patients Only)       Balance Overall balance assessment: Needs assistance Sitting-balance support: Feet supported, No upper extremity supported Sitting balance-Leahy Scale: Good Sitting balance - Comments: seated at EOB   Standing balance support: No upper extremity supported, During functional activity Standing balance-Leahy Scale: Good Standing balance comment: without AD                             Pertinent Vitals/Pain Pain Assessment Pain Assessment: Faces Faces Pain Scale: Hurts a little bit Pain Location: groin area, arms Pain Descriptors / Indicators: Discomfort, Sore Pain Intervention(s): Limited activity within patient's tolerance, Monitored during session, Repositioned    Home Living Family/patient expects to be discharged to:: Private residence Living Arrangements: Alone Available Help at Discharge: Family;Available PRN/intermittently Type of Home: House Home Access: Ramped entrance  Home Layout: One level Home Equipment: Cane - single Librarian, academic (2 wheels);Shower seat - built in;Grab bars - tub/shower      Prior Function Prior Level of Function : Independent/Modified Independent;Needs  assist             Mobility Comments: household and short distanced community ambulator without AD ADLs Comments: Independent household, assisted for grocery shopping and driving.     Hand Dominance   Dominant Hand: Right    Extremity/Trunk Assessment   Upper Extremity Assessment Upper Extremity Assessment: Overall WFL for tasks assessed    Lower Extremity Assessment Lower Extremity Assessment: Overall WFL for tasks assessed    Cervical / Trunk Assessment Cervical / Trunk Assessment: Kyphotic  Communication   Communication: No difficulties  Cognition Arousal/Alertness: Awake/alert Behavior During Therapy: WFL for tasks assessed/performed Overall Cognitive Status: Within Functional Limits for tasks assessed                                          General Comments      Exercises     Assessment/Plan    PT Assessment All further PT needs can be met in the next venue of care  PT Problem List Decreased strength;Decreased activity tolerance;Decreased balance;Decreased mobility       PT Treatment Interventions      PT Goals (Current goals can be found in the Care Plan section)  Acute Rehab PT Goals Patient Stated Goal: return home with family to assist PT Goal Formulation: With patient Time For Goal Achievement: 04/10/23 Potential to Achieve Goals: Good    Frequency       Co-evaluation               AM-PAC PT "6 Clicks" Mobility  Outcome Measure Help needed turning from your back to your side while in a flat bed without using bedrails?: None Help needed moving from lying on your back to sitting on the side of a flat bed without using bedrails?: None Help needed moving to and from a bed to a chair (including a wheelchair)?: None Help needed standing up from a chair using your arms (e.g., wheelchair or bedside chair)?: None Help needed to walk in hospital room?: A Little Help needed climbing 3-5 steps with a railing? : A Little 6  Click Score: 22    End of Session   Activity Tolerance: Patient tolerated treatment well Patient left: in chair;with call bell/phone within reach Nurse Communication: Mobility status PT Visit Diagnosis: Unsteadiness on feet (R26.81);Other abnormalities of gait and mobility (R26.89);Muscle weakness (generalized) (M62.81)    Time: 1455-1520 PT Time Calculation (min) (ACUTE ONLY): 25 min   Charges:   PT Evaluation $PT Eval Moderate Complexity: 1 Mod PT Treatments $Therapeutic Activity: 23-37 mins        3:40 PM, 04/10/23 Ocie Bob, MPT Physical Therapist with [facility] [phone] office [phone] mobile phone

## 2023-04-10 NOTE — Discharge Instructions (Addendum)
Your workup today was reassuring and the physical therapy team believed you are able to go home with close primary care follow up. Call your PCP tomorrow to schedule a follow up appointment in the next few days. Our social worker arranged a home health agency to assist you at home as discussed. For any new or worsening condition, return to nearest ED for re-evaluation.

## 2023-04-10 NOTE — ED Provider Notes (Signed)
[facility] Provider Note   CSN: [id number] Arrival date & time: 04/10/23  4098     History  Chief Complaint  Patient presents with   Weakness    Weakness w/ groin pain    Hudson Poot is a 77 y.o. female with a past medical history CAD, GERD, hypertension, CVA who presents to the ED complaining of "feeling unwell" for at least the last month. Pt with somewhat vague complaints and notes they have been ongoing for sometime. She particularly pinpoints generalized abdominal pain worse in LLQ that has been bothersome and associated with nausea. No fever, vomiting, or diarrhea. Last BM this AM and normal. No dysuria, hematuria, frequency, vaginal bleeding or discharge. Notes she had a colonoscopy by GI in the spring which was normal apart from diverticulitis which she completed 2 rounds of antibiotics for. She also c/o generalized weakness with difficulty changing positions, getting dressed, and ambulating which is a change from her baseline. She normally ambulates unassisted and lives alone. She has been eating and drinking normally. Noted to nursing staff as well vision changes likely ongoing since CVA in 2023 but does not endorse this to me. Denies chest pain, shortness of breath, cough, congestion, neck or back pain. Denies fall or injury.       Home Medications Prior to Admission medications   Medication Sig Start Date End Date Taking? Authorizing Provider  ALPRAZolam (XANAX) 0.25 MG tablet Take 1-2 tabs (0.25mg -0.50mg ) 30-60 minutes before procedure. May repeat if needed.Do not drive. Patient not taking: Reported on 01/12/2023 12/07/22   Anson Fret, MD  amLODipine (NORVASC) 5 MG tablet Take 5 mg by mouth daily as needed (High BP).    [provider]  aspirin EC 81 MG tablet Take 1 tablet (81 mg total) by mouth daily. Swallow whole. 10/22/22   Johnson, Clanford L, MD  atorvastatin (LIPITOR) 40 MG tablet Take 1 tablet (40 mg total)  by mouth every evening. Patient not taking: Reported on 01/12/2023 10/21/22   Cleora Fleet, MD  cyanocobalamin (VITAMIN B12) 1000 MCG tablet Take 1,000 mcg by mouth daily.    [provider]  dicyclomine (BENTYL) 10 MG capsule Take 1 capsule (10 mg total) by mouth every 8 (eight) hours. For abdominal pain/cramping Patient not taking: Reported on 01/12/2023 11/15/22   Benancio Deeds, MD  famotidine (PEPCID) 20 MG tablet Take 20 mg by mouth daily as needed for heartburn or indigestion.    [provider]  gabapentin (NEURONTIN) 300 MG capsule Take 300 mg by mouth at bedtime. 07/18/22   [provider]  losartan (COZAAR) 50 MG tablet Take 1 tablet (50 mg total) by mouth daily. Patient taking differently: Take 50 mg by mouth daily as needed (High BP). 10/22/22   Johnson, Clanford L, MD  Na Sulfate-K Sulfate-Mg Sulf (SUPREP BOWEL PREP KIT) 17.5-3.13-1.6 GM/177ML SOLN Take 1 kit by mouth as directed. 12/08/22   Armbruster, Willaim Rayas, MD  ondansetron (ZOFRAN) 4 MG tablet Take 1 tablet (4 mg total) by mouth every 6 (six) hours as needed for nausea. Patient not taking: Reported on 01/12/2023 10/21/22   Cleora Fleet, MD  Peppermint Oil (IBGARD) 90 MG CPCR Take 90 mg by mouth daily as needed (IBS).    [provider]  Propylene Glycol (SYSTANE BALANCE) 0.6 % SOLN Place 1 drop into both eyes daily as needed (dry eye).    [provider]  traZODone (DESYREL) 100 MG tablet Take 100  mg by mouth at bedtime. 03/02/22   [provider]  zolpidem (AMBIEN) 10 MG tablet Take 10 mg by mouth at bedtime.    [provider]      Allergies    Lexapro [escitalopram], Protonix [pantoprazole], and Naproxen sodium    Review of Systems   Review of Systems  All other systems reviewed and are negative.   Physical Exam Updated Vital Signs BP (!) 177/82   Pulse (!) 58   Temp 97.8 F (36.6 C) (Oral)   Resp 15   SpO2 95%  Physical Exam Vitals and  nursing note reviewed.  Constitutional:      General: She is not in acute distress.    Appearance: Normal appearance. She is not ill-appearing, toxic-appearing or diaphoretic.     Comments: Generally weak but symmetrical through all extremities, difficulty sitting up without significant assistance by myself and use of bedrails  HENT:     Head: Normocephalic and atraumatic.     Mouth/Throat:     Mouth: Mucous membranes are dry.  Eyes:     General: No visual field deficit or scleral icterus.    Extraocular Movements: Extraocular movements intact.     Conjunctiva/sclera: Conjunctivae normal.     Pupils: Pupils are equal, round, and reactive to light.  Cardiovascular:     Rate and Rhythm: Normal rate and regular rhythm.     Heart sounds: No murmur heard. Pulmonary:     Effort: Pulmonary effort is normal. No respiratory distress.     Breath sounds: Normal breath sounds. No stridor. No wheezing, rhonchi or rales.  Chest:     Chest wall: No tenderness.  Abdominal:     General: Abdomen is flat. There is no distension.     Palpations: Abdomen is soft.     Tenderness: There is abdominal tenderness (mild generalized). There is no right CVA tenderness, left CVA tenderness, guarding or rebound.  Musculoskeletal:        General: No deformity.     Cervical back: Normal range of motion and neck supple. No rigidity.     Right lower leg: No edema.     Left lower leg: No edema.     Comments: Moves all extremities equally and spontaneously  Skin:    General: Skin is warm and dry.     Capillary Refill: Capillary refill takes less than 2 seconds.     Coloration: Skin is not jaundiced or pale.     Findings: No rash.  Neurological:     Mental Status: She is alert and oriented to person, place, and time.     GCS: GCS eye subscore is 4. GCS verbal subscore is 5. GCS motor subscore is 6.     Cranial Nerves: Cranial nerves 2-12 are intact. No cranial nerve deficit, dysarthria or facial asymmetry.      Sensory: Sensation is intact.     Motor: Weakness (symmetric bilaterally, nonfocal) present. No tremor, atrophy, abnormal muscle tone or seizure activity.  Psychiatric:        Behavior: Behavior normal.     ED Results / Procedures / Treatments   Labs (all labs ordered are listed, but only abnormal results are displayed) Labs Reviewed  COMPREHENSIVE METABOLIC PANEL - Abnormal; Notable for the following components:      Result Value   Albumin 3.4 (*)    AST 12 (*)    All other components within normal limits  URINALYSIS, ROUTINE W REFLEX MICROSCOPIC - Abnormal; Notable for the following  components:   Color, Urine STRAW (*)    Specific Gravity, Urine 1.004 (*)    All other components within normal limits  CBC WITH DIFFERENTIAL/PLATELET  LIPASE, BLOOD  TROPONIN I (HIGH SENSITIVITY)    EKG EKG Interpretation  Date/Time:  Monday April 10 2023 11:51:13 EDT Ventricular Rate:  66 PR Interval:  204 QRS Duration: 132 QT Interval:  470 QTC Calculation: 493 R Axis:   -62 Text Interpretation: Sinus or ectopic atrial rhythm Left bundle branch block similar to earlier in the day Confirmed by Pricilla Loveless [phone]) on 04/10/2023 11:56:18 AM  Radiology CT ABDOMEN PELVIS W CONTRAST  Result Date: 04/10/2023 CLINICAL DATA:  Left lower quadrant pain. Generalized weakness for 1 month. EXAM: CT ABDOMEN AND PELVIS WITH CONTRAST TECHNIQUE: Multidetector CT imaging of the abdomen and pelvis was performed using the standard protocol following bolus administration of intravenous contrast. RADIATION DOSE REDUCTION: This exam was performed according to the departmental dose-optimization program which includes automated exposure control, adjustment of the mA and/or kV according to patient size and/or use of iterative reconstruction technique. CONTRAST:  OMNIPAQUE IOHEXOL 300 MG/ML  SOLN COMPARISON:  12/07/2022 FINDINGS: Lower chest: No acute abnormality. Hepatobiliary: No focal liver abnormality is  seen. No gallstones, gallbladder wall thickening, or biliary dilatation. Pancreas: Unremarkable. No pancreatic ductal dilatation or surrounding inflammatory changes. Spleen: Normal in size without focal abnormality. Adrenals/Urinary Tract: Adrenal glands are unremarkable. Kidneys are normal, without renal calculi, focal lesion, or hydronephrosis. Bladder is unremarkable. Stomach/Bowel: Stomach is within normal limits. No evidence of bowel wall thickening, distention, or inflammatory changes. Diverticulosis without evidence of diverticulitis. Vascular/Lymphatic: Normal caliber abdominal aorta with moderate atherosclerosis. No lymphadenopathy. Reproductive: Status post hysterectomy. No adnexal masses. Other: No abdominal wall hernia or abnormality. No abdominopelvic ascites. Musculoskeletal: No acute osseous abnormality. No aggressive osseous lesion. Diffuse facet arthropathy throughout the lumbar spine. Bilateral foraminal stenosis at L4-5 and L5-S1. IMPRESSION: 1. No acute abdominal or pelvic pathology. 2. Diverticulosis without evidence of diverticulitis. 3.  Aortic Atherosclerosis (ICD10-I70.0). Electronically Signed   By: Elige Ko M.D.   On: 04/10/2023 12:40   CT Head Wo Contrast  Result Date: 04/10/2023 CLINICAL DATA:  Generalized weakness, vision changes EXAM: CT HEAD WITHOUT CONTRAST TECHNIQUE: Contiguous axial images were obtained from the base of the skull through the vertex without intravenous contrast. RADIATION DOSE REDUCTION: This exam was performed according to the departmental dose-optimization program which includes automated exposure control, adjustment of the mA and/or kV according to patient size and/or use of iterative reconstruction technique. COMPARISON:  10/19/2022 FINDINGS: Brain: No evidence of acute infarction, hemorrhage, mass, mass effect, or midline shift. No hydrocephalus or extra-axial fluid collection. Hypodensity in the lateral left thalamus, consistent with expected evolution  of previously noted left thalamic capsular infarct. Vascular: No hyperdense vessel. Skull: Negative for fracture or focal lesion. Sinuses/Orbits: No acute finding. Status post bilateral lens replacements. Other: The mastoid air cells are well aerated. IMPRESSION: No acute intracranial process. Electronically Signed   By: Wiliam Ke M.D.   On: 04/10/2023 12:26   DG Chest 2 View  Result Date: 04/10/2023 CLINICAL DATA:  weakness EXAM: CHEST - 2 VIEW COMPARISON:  03/25/2022 FINDINGS: Coarse bibasilar interstitial opacities right greater than left stable. No new infiltrate or overt edema. Heart size and mediastinal contours are within normal limits. Aortic Atherosclerosis (ICD10-170.0). No effusion. Visualized bones unremarkable. IMPRESSION: Chronic bibasilar interstitial opacities. No acute findings. Electronically Signed   By: Corlis Leak M.D.   On: 04/10/2023 10:04  Procedures Procedures    Medications Ordered in ED Medications  ondansetron (ZOFRAN) injection 4 mg (4 mg Intravenous Patient Refused/Not Given 04/10/23 1235)  sodium chloride 0.9 % bolus 500 mL (0 mLs Intravenous Stopped 04/10/23 1136)  iohexol (OMNIPAQUE) 300 MG/ML solution 100 mL (100 mLs Intravenous Contrast Given 04/10/23 1130)  lactated ringers bolus 1,000 mL (0 mLs Intravenous Stopped 04/10/23 1327)  HYDROcodone-acetaminophen (NORCO/VICODIN) 5-325 MG per tablet 1 tablet (1 tablet Oral Given 04/10/23 1327)    ED Course/ Medical Decision Making/ A&P                             Medical Decision Making Amount and/or Complexity of Data Reviewed Labs: ordered. Decision-making details documented in ED Course. Radiology: ordered. Decision-making details documented in ED Course. ECG/medicine tests: ordered. Decision-making details documented in ED Course.  Risk Prescription drug management.   Medical Decision Making:   Mayan Dolney is a 77 y.o. female who presented to the ED today with weakness / abdominal pain detailed  above.    Patient's presentation is complicated by their history of advanced age, HTN, CVA.  Patient placed on continuous vitals and telemetry monitoring while in ED which was reviewed periodically.  Complete initial physical exam performed, notably the patient  was nontoxic appearing in NAD. Generalized abdominal tenderness without rebound, guarding, or peritoneal signs. No CVA tenderness. Generalized weakness but pt A&Ox4 without focal deficits.    Reviewed and confirmed nursing documentation for past medical history, family history, social history.    Initial Assessment:   With the patient's presentation of abdominal pain, differential diagnosis includes but is not limited to AAA, mesenteric ischemia, appendicitis, diverticulitis, DKA, gastritis, gastroenteritis, AMI, nephrolithiasis, pancreatitis, peritonitis, adrenal insufficiency, intestinal ischemia, constipation, UTI, SBO/LBO, splenic rupture, biliary disease, IBD, IBS, PUD, hepatitis, STD, ovarian/testicular torsion, electrolyte disturbance, DKA, dehydration, acute kidney injury, renal failure, cholecystitis, cholelithiasis, choledocholithiasis, abdominal pain of  unknown etiology.  With the patient's presentation of weakness, differential diagnosis includes but is not limited to CVA, spinal cord injury, ACS, arrhythmia, syncope, orthostatic hypotension, sepsis, hypoglycemia, hypoxia, electrolyte disturbance, endocrine disorder, anemia, environmental exposure, polypharmacy   Initial Plan:  Screening labs including CBC and Metabolic panel to evaluate for infectious or metabolic etiology of disease.  Lipase to evaluate for pancreatitis Urinalysis with reflex culture ordered to evaluate for UTI or relevant urologic/nephrologic pathology.  CT abd/pelvis to evaluate for intra-abdominal pathology EKG and troponin to evaluate for cardiac pathology CT brain to evaluate for intracranial pathology Chest x-ray to evaluate for intrathoracic  pathology Symptomatic management Objective evaluation as reviewed   Initial Study Results:   Laboratory  All laboratory results reviewed without evidence of clinically relevant pathology.    EKG EKG was reviewed independently. NSR. ST segments without concerns for elevations.     Radiology:  All images reviewed independently. Agree with radiology report at this time.   CT ABDOMEN PELVIS W CONTRAST  Result Date: 04/10/2023 CLINICAL DATA:  Left lower quadrant pain. Generalized weakness for 1 month. EXAM: CT ABDOMEN AND PELVIS WITH CONTRAST TECHNIQUE: Multidetector CT imaging of the abdomen and pelvis was performed using the standard protocol following bolus administration of intravenous contrast. RADIATION DOSE REDUCTION: This exam was performed according to the departmental dose-optimization program which includes automated exposure control, adjustment of the mA and/or kV according to patient size and/or use of iterative reconstruction technique. CONTRAST:  OMNIPAQUE IOHEXOL 300 MG/ML  SOLN COMPARISON:  12/07/2022 FINDINGS: Lower chest: No  acute abnormality. Hepatobiliary: No focal liver abnormality is seen. No gallstones, gallbladder wall thickening, or biliary dilatation. Pancreas: Unremarkable. No pancreatic ductal dilatation or surrounding inflammatory changes. Spleen: Normal in size without focal abnormality. Adrenals/Urinary Tract: Adrenal glands are unremarkable. Kidneys are normal, without renal calculi, focal lesion, or hydronephrosis. Bladder is unremarkable. Stomach/Bowel: Stomach is within normal limits. No evidence of bowel wall thickening, distention, or inflammatory changes. Diverticulosis without evidence of diverticulitis. Vascular/Lymphatic: Normal caliber abdominal aorta with moderate atherosclerosis. No lymphadenopathy. Reproductive: Status post hysterectomy. No adnexal masses. Other: No abdominal wall hernia or abnormality. No abdominopelvic ascites. Musculoskeletal: No  acute osseous abnormality. No aggressive osseous lesion. Diffuse facet arthropathy throughout the lumbar spine. Bilateral foraminal stenosis at L4-5 and L5-S1. IMPRESSION: 1. No acute abdominal or pelvic pathology. 2. Diverticulosis without evidence of diverticulitis. 3.  Aortic Atherosclerosis (ICD10-I70.0). Electronically Signed   By: Elige Ko M.D.   On: 04/10/2023 12:40   CT Head Wo Contrast  Result Date: 04/10/2023 CLINICAL DATA:  Generalized weakness, vision changes EXAM: CT HEAD WITHOUT CONTRAST TECHNIQUE: Contiguous axial images were obtained from the base of the skull through the vertex without intravenous contrast. RADIATION DOSE REDUCTION: This exam was performed according to the departmental dose-optimization program which includes automated exposure control, adjustment of the mA and/or kV according to patient size and/or use of iterative reconstruction technique. COMPARISON:  10/19/2022 FINDINGS: Brain: No evidence of acute infarction, hemorrhage, mass, mass effect, or midline shift. No hydrocephalus or extra-axial fluid collection. Hypodensity in the lateral left thalamus, consistent with expected evolution of previously noted left thalamic capsular infarct. Vascular: No hyperdense vessel. Skull: Negative for fracture or focal lesion. Sinuses/Orbits: No acute finding. Status post bilateral lens replacements. Other: The mastoid air cells are well aerated. IMPRESSION: No acute intracranial process. Electronically Signed   By: Wiliam Ke M.D.   On: 04/10/2023 12:26   DG Chest 2 View  Result Date: 04/10/2023 CLINICAL DATA:  weakness EXAM: CHEST - 2 VIEW COMPARISON:  03/25/2022 FINDINGS: Coarse bibasilar interstitial opacities right greater than left stable. No new infiltrate or overt edema. Heart size and mediastinal contours are within normal limits. Aortic Atherosclerosis (ICD10-170.0). No effusion. Visualized bones unremarkable. IMPRESSION: Chronic bibasilar interstitial opacities. No  acute findings. Electronically Signed   By: Corlis Leak M.D.   On: 04/10/2023 10:04      Consults: Case discussed with Dr. Natale Milch with hospital medicine who recommended PT for recommendations.   Case discussed with PT who were able to successfully ambulate patient with minimal assistance, they recommended home health which was arranged by social work.  Final Assessment and Plan:   This is a 77 year old female who presents to the ED with multiple complaints, most notably generalized weakness and abdominal pain.  Unclear timeline of how long symptoms have been going on though seems as if they may be chronic.  Workup initiated as above for further assessment.  On my exams, patient seems to have generalized weakness to all extremities, no focal deficits.  CT abdomen pelvis without acute findings.  Urine does not appear infected.  No significant electrolyte disturbance, no AKI.  Patient tolerating p.o.  Troponin negative.  No acute ST-T changes.  No acute findings on chest x-ray.  CT head also normal.  Discussed case with attending physician who agreed to consult hospitalist for recommendations.  Hospitalist recommended PT evaluation as no medical indication for admission at this time.  On PT and multiple nursing reassessments, patient was able to ambulate with very minimal assistance and  it is reported that she has been doing well in the ED with minimal complaints and been able to tolerate p.o., ambulate, and has required minimal assistance with any activity.  PT recommended home health.  This was arranged by social work.  With this, patient stable for admission home.  Home health orders placed by attending physician.  Will have patient follow-up closely with primary care.  Strict ED return precautions given, all questions answered, and stable for discharge.   Clinical Impression:  1. Generalized weakness   2. Physical deconditioning      Discharge           Final Clinical Impression(s) / ED  Diagnoses Final diagnoses:  Generalized weakness  Physical deconditioning    Rx / DC Orders ED Discharge Orders          Ordered    Home Health        04/10/23 1531    Face-to-face encounter (required for Medicare/Medicaid patients)       Comments: I Audree Camel certify that this patient is under my care and that I, or a nurse practitioner or physician's assistant working with me, had a face-to-face encounter that meets the physician face-to-face encounter requirements with this patient on 04/10/2023. The encounter with the patient was in whole, or in part for the following medical condition(s) which is the primary reason for home health care (List medical condition): weakness   04/10/23 1531              Richardson Dopp 04/10/23 1620    Pricilla Loveless, MD 04/11/23 [phone]

## 2023-06-14 ENCOUNTER — Encounter: Payer: Self-pay | Admitting: Neurology

## 2023-06-14 ENCOUNTER — Ambulatory Visit: Payer: Medicare Other | Admitting: Neurology

## 2023-06-14 VITALS — BP 143/88 | HR 71 | Ht 63.0 in | Wt 147.0 lb

## 2023-06-14 DIAGNOSIS — E538 Deficiency of other specified B group vitamins: Secondary | ICD-10-CM | POA: Diagnosis not present

## 2023-06-14 DIAGNOSIS — R29898 Other symptoms and signs involving the musculoskeletal system: Secondary | ICD-10-CM

## 2023-06-14 DIAGNOSIS — R202 Paresthesia of skin: Secondary | ICD-10-CM

## 2023-06-14 DIAGNOSIS — G959 Disease of spinal cord, unspecified: Secondary | ICD-10-CM

## 2023-06-14 DIAGNOSIS — M4802 Spinal stenosis, cervical region: Secondary | ICD-10-CM | POA: Diagnosis not present

## 2023-06-14 MED ORDER — GABAPENTIN 300 MG PO CAPS: 300.0000 mg | ORAL_CAPSULE | Freq: Three times a day (TID) | ORAL | 11 refills | Status: DC

## 2023-06-14 NOTE — Progress Notes (Signed)
[facility]    Provider:  Dr Kayla Terry Primary Care Provider:  [name], MD  CC: Stroke  06/14/2023: Here for follow up. Will recheck B12. She has cervical stenosis, severe and she has symptoms and for years I have been telling her this, will send her to [facility]. She has already been to pain management. She wants oxycodone which really helped with her arms and leg paresthesias. She has tried: Physical therapy, tried for pain: gabapentin, cymbalta, tylenol, ibuprofen and various anlagesics, hydrocodone. Will increase Gabapentin. Can try Lyrica in the future. Send to pain management for pain management, and ortho Dr. Ophelia Terry for evaluation of decompression surgery (Dr. Venetia Terry in the past had wanted to perform this surgery and she declined and now he has retired)    Addedndum: 12/12/2022: Please call patient, her b12 is on the lower end and her methylmalonic acid(MMA) is elevated which means she does still have B12 deficiency which can be seriou. She will have to take supplements throughout her life.  1000-2050mcg B12 daily po long term  recheck B12 in 3-4 months  B12 deficiency can cause weakness, fatigue, easy bruising or bleeding,sore tongue, stomach upset, weight loss, and diarrhea or constipation, tingling or numbness to the fingers and toes, difficulty walking, mood changes, depression, memory loss, disorientation and, in severe cases, dementia. Recommend 1000-2000 mcg B12 daily.   An elevated MMA is a very sensitive and specific marker of B12 deficiency. There is evidence that up to 10% of individuals who have normal or low normal B12 values (between 150 and 400 pg/mL) may develop neuropsychiatric sequelae of B12 deficiency without any evidence of megaloblastic anemia. The MMA test can identify this population of functionally B12-deficient individuals. In these patients, oral cyanocobalamin 1-2 mg/day will normalize the MMA and hopefully improve their  symptoms  12/06/2022: 77 y.o. female here as requested by Kayla Amas DO for worsening headache.  She has a past medical history of GERD, herpes zoster without complications, hypertension,chronic pain, hyperlipidemia, restless leg syndrome, nocturia, chest tightness, dizziness.  Is here today for lacunar infarct, she was not on aspirin, she has hyperlipidemia.  We have seen her in the past for multiple other complaints last seen in June 2023 she has had extensive workup with [place] including images including MRI of the brain, cervical spine in the past showed moderate to severe stenosis and we sent her to neurosurgery who recommended decompression and she declined, EMG nerve conduction study in the past was normal, extensive serum testing showed B12 deficiency and we discussed supplementation with her.  Today she is here with her friend who also provides much information.  Patient states that she woke up with acute arm weakness, she went to the emergency room and saw stroke on MRI brain.  This was considered a lacunar small vessel ischemic stroke, she was discharged on a statin (she has not taken it), dual antiplatelet and then aspirin after 3 weeks.  Today she is here to review imaging, I discussed this in detail with patient, we reviewed imaging, show to the stroke, I discussed the difference between embolic versus lacunar strokes, I also stressed the importance of taking her aspirin 81 mg daily every day and also treating her cholesterol which she is hesitant to do she has not started that.  She states that bilateral legs with paresthesias the whole leg to the toes and feet which is not consistent with the stroke she had and likely due to the cervical stenosis and possibly  myelopathy we tried to have her treated for. No low back pain.she still has right srm weakness, numbness and tingling.  Since Dr. Venetia Terry retired she has been seeing Kayla Terry and had an MRI lumbar spine with him but has not repeated the MRI of her  cervical spine.  No other focal neurologic deficits, associated symptoms, inciting events or modifiable factors.  Reviewed notes, labs and imaging from outside physicians, which showed  MRI brain 10/19/2022: Brain: Small acute left thalamocapsular infarct. Mild associated edema without significant mass effect. Midline shift. No evidence of acute hemorrhage, mass lesion, midline shift or hydrocephalus.   Vascular: See below.   Skull and upper cervical spine: Normal marrow signal.   Sinuses/Orbits: Clear sinuses.  No acute orbital findings.   Other: No mastoid effusions.   MRA HEAD FINDINGS   Anterior circulation: Bilateral intracranial ICAs, MCAs, and ACAs are patent without proximal high-grade stenosis.   Posterior circulation: Bilateral intradural vertebral arteries, basilar artery and bilateral posterior cerebral arteries are patent. At least moderate stenosis of the right P2 PCA appear   IMPRESSION: 1. Small acute left thalamocapsular infarct. 2. No emergent large vessel occlusion. 3. At least moderate right P2 PCA stenosis.    04/06/2022: This is a patient who we have seen in the past for similar symptoms for weakness and pain in the limbs.  At the last time we saw her we really did not have any more neurologically to offer her, we imaged her MRI brain(unremarkable) and cervical spine(moderate to severe stenosis) and had an EMG nerve conduction study(normal).  MRI of the cervical spine showed central stenosis and we sent her to Dr. Venetia Terry, he recommended surgery and she declined, we also diagnosed her with a B12 deficiency. She has similar symptoms, her upper arms and legs feel weak, she has not fallen, she feels weak. She is not taking her B12 supplements. Her back hurts just started yesyerday. No double vision or problems swallowing. No ptosis, no bulbar symptoms. Her friend is here and provides much information.  05/2020: MRI cervical spine: IMPRESSION: personally reviewed images  and reviewed them wtith patient 1.  Cervical spondylosis and facet arthrosis as described above. This is most notable for moderate to severe spinal canal and severe RIGHT and moderate LEFT neuroforaminal stenosis at C6-C7.  2.  Mild to moderate spinal canal narrowing and severe bilateral neuroforaminal stenosis at C5-C6.  3.  Moderate LEFT and mild RIGHT neuroforaminal stenosis and mild spinal canal narrowing at C4-C5.  05/2020 MRI brain: IMPRESSION:  1.  No acute intracranial abnormality.  2.  Few small discrete white matter lesions are identified. These lesions are abnormal but nonspecific, usually resulting from benign/remote/incidental causes (e.g. prior trauma/inflammation/demyelinization, or chronic ischemia associated with migraines/atherosclerosis/other vasculopathies). Favor chronic microvascular disease.   Patient complains of symptoms per HPI as well as the following symptoms: imbalance . Pertinent negatives and positives per HPI. All others negative   HPI 03/03/2021: Patient seen in the past for headaches and weakness. Today seen for weakness, stable, she feels cold all the time and has had weight loss and I apologized and explained that she should really see her primary care about this, not sure I have anymore neurologically to offer. I have seen her in the past for stable weakness, and she has had MRI brain, cervical spine and emg/ncs. We diagnosed her with b12 deficiency and sent her to Dr. Venetia Terry for cervical stenosis and myelopathy in the past. She saw Dr. Venetia Terry and he recommended surgery. She  didn't have it completed, she declined.  She states no new symptoms, no of the same symptoms, she has been to different doctors since she saw me. She stays cold and she is "jittery" nervous (TSH is normal). She saw cardiology and she was evaluated and she has further evaluation on June 1st.   Patient complains of symptoms per HPI as well as the following symptoms: weight loss, feels cold. . Pertinent  negatives and positives per HPI. All others negative   HPI:  Kayla Terry is a 77 y.o. female here as requested by Kayla Amas DO for worsening headache.  She has a past medical history of GERD, herpes zoster without complications, hypertension,chronic pain, hyperlipidemia, restless leg syndrome, nocturia, chest tightness, dizziness.  I reviewed Kayla Amas DO's notes: Patient was last seen Mar 12, 2020, she described ear crackling and dizziness, she had previously been to the emergency room for dizziness and chest pain and troponins were negative, negative chest x-ray, negative lab work, negative MRI of the head (was a limited MR), patient's blood pressure was elevated at the time of patient was started on amlodipine 5 mg daily.  She was diagnosed with possible GERD and started on medication.  When patient was last seen she stated her symptoms were still persistent, blood pressure between 114 and 130 at home systolic, she was given recent eyedrops for potential glaucoma and was concerned that this could be a potential side effect of the eyedrops, she complains of dizziness and headache in the back of the head at that time for 6 days with a recent developing of left ear crackling without any ear drainage, decrease in hearing, or ear pain had a slightly clear runny nose but sore throat, coughing, dizziness feels like the room is spinning, can happen with sitting or standing, can last for hours no vomiting but she does have nausea.Shawn Lazoff DO's examination showed patient in no distress, normal head, eyes, nose, throat however she had impacted cerumen in the left ear which was removed; also cardiovascular, pulmonary, abdominal, musculoskeletal, neurologic and psychiatric examination was normal, negative Dix-Hallpike maneuver, patient's dizziness did not appear to be orthostatic related to dizziness is persistent regardless of position, she was told to try stopping the eyedrops for 10 days, meclizine as  needed.   I also reviewed emergency room notes recently seen April 15, 2020, she presented with ongoing weakness, chest pain, anorexia, nausea, started May 6, she had already been to the emergency room been evaluated by gastroenterology and had a stress test the day before, she still persistently has generalized weakness without focality, pressure in her sternum, associated anorexia, nausea, fatigue, no fever, no vomiting no shortness of breath, also had one course of doxycycline.  Her blood pressure was elevated 174/116 in the emergency room, otherwise her examination showed that she was not in acute distress, rate and rhythm normal, cardiovascular, pulmonary, abdominal, neurologic all normal.  She had an extensive work-up including imaging of her abdomen pelvis and chest, there was some mass-effect on the right ventricle but the emergency room did not think that this was significant and did further follow-up, they also reviewed the stress test, they diagnosed her with atypical chest pain.  CBC and BMP were normal.  They also checked her troponins, LFTs, D-dimer, lipase and discharged with a diagnosis of atypical chest pain.  Patient is here alone today and states that although she has been having headaches in the back of the head she is not concerned by that and her  biggest concern is because her arms and legs have been feeling weak.  She has not been feeling well since 1 May.  She went to the hospital, her blood pressure was elevated, she was having pain in her chest, evaluation showed no cardiac etiology, she was started on blood pressure meds and came home since then she has been having a dull headache in the back of the head, Excedrin helps a little bit, her legs and arms stay weak, she can function but they fall asleep and her lips tingle. This all started in May at the emergency room. In early may she went to the hospital for elevated blood pressure and chest pain. She is now off of amlodipine.  Patient  reports that the weakness predates all of this above, weakness ongoing for years and worsening recently. The weakness has been worsening. She was on doxycycline recently and she had tingling in the face and the legs and arms and unclear if this made her symptoms worse, ongoing slowly progressively worsening. She feels like her arms and legs are asleep. She can even be in bed and they feel asleep and she gets up and feels better. Worse in the left arm but all the limbs are affected, she denies any difficulty getting out of a chair or off of the floor, she can get to the top of the stairs but she feels out of breath but not due to weakness. She is generally weak all over. Slowly progressive. Numbness and tingling of all the limbs not in the face since stopping doxycycline, she has to take her time because she is afraid she is going to fall. No muscle loss or weight loss. She has noticed very occasion action tremor of the left hand and she is more cold natured. Her husband died in 12/23/22, she is on zolpidem and she take neurontin as well. She has chronic pain. headache is mild in the back of the head not positional or exertional. 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 No double vision, no droopy eyelids, no difficulty swallowing.   Reviewed notes, labs and imaging from outside physicians, which showed  I reviewed MRI of the brain report which was of poor quality, patient was unable to tolerate the full examination, and the images that were taken were severely motion degraded.  The limited study diffusion weighted showed no acute infarct.  Review of Systems: Patient complains of symptoms per HPI as well as the following symptoms: Insomnia, restless legs, headache, numbness, weakness, dizziness, fatigue, chest pain, feeling cold, decreased energy, change in appetite. Pertinent negatives and positives per HPI. All others negative.   Social History   Socioeconomic History   Marital  status: Widowed    Spouse name: Not on file   Number of children: Not on file   Years of education: Not on file   Highest education level: Not on file  Occupational History   Not on file  Tobacco Use   Smoking status: Never   Smokeless tobacco: Never  Vaping Use   Vaping status: Never Used  Substance and Sexual Activity   Alcohol use: No   Drug use: No   Sexual activity: Not on file  Other Topics Concern   Not on file  Social History Narrative   Lives alone    Her husband passed 12/24/2019  Caffeine: maybe 1 cup/day, mostly water    Social Determinants of Health   Financial Resource Strain: Not on file  Food  Insecurity: Low Risk  (04/19/2023)   [facility]   Food vital sign    Within the past 12 months, you worried that your food would run out before you got money to buy more: Never true    Within the past 12 months, the food you bought just didn't last and you didn't have money to get more. : Never true  Transportation Needs: Not on file (04/19/2023)  Physical Activity: Not on file  Stress: Not on file  Social Connections: Unknown (03/15/2022)   [facility]   Social Network    Social Network: Not on file  Intimate Partner Violence: Not At Risk (10/19/2022)   Humiliation, Afraid, Rape, and Kick questionnaire    Fear of Current or Ex-Partner: No    Emotionally Abused: No    Physically Abused: No    Sexually Abused: No    Family History  Problem Relation Age of Onset   Cancer Mother    Leukemia Mother    Lung cancer Father    Multiple myeloma Father    Multiple sclerosis Sister    Alcohol abuse Brother    Multiple myeloma Brother    Colon cancer Neg Hx    Colon polyps Neg Hx    Stomach cancer Neg Hx    Esophageal cancer Neg Hx    Neuropathy Neg Hx    Stroke Neg Hx     Past Medical History:  Diagnosis Date   Allergy    B12 deficiency 04/2020   Cataract    Coronary artery disease    COVID-19  04/22/2021   Diverticulitis    10/19/2022   Fatty liver    GERD (gastroesophageal reflux disease)    Herpes zoster without complications    Hiatal hernia    Hypertension    Mild mitral regurgitation 2013   Mobitz type 2 second degree atrioventricular block    Pectus excavatum    pectus excavatum deformity resulting in mass effect on the RV by CT 03/2020   Pulmonary nodule    Shingles    Stroke (HCC)    10/19/2022    Patient Active Problem List   Diagnosis Date Noted   Benign neoplasm of colon 01/17/2023   Acute stroke due to ischemia (HCC) 10/21/2022   Diverticulitis of colon 10/19/2022   Leukocytosis 10/19/2022   Abdominal pain 08/02/2022   Hypertensive urgency 08/01/2022   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/06/2022   COVID-19 virus infection 04/23/2021   Coronary artery disease 04/23/2021   Chest pain 04/22/2021   Chest pain of uncertain etiology    Nonspecific abnormal electrocardiogram (ECG) (EKG)    2nd degree AV block 03/25/2021   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   B12 deficiency 05/07/2020   Muscle weakness 05/06/2020   Benign essential hypertension 06/08/2017   Acid reflux disease 03/08/2016   Hyperlipidemia 03/08/2016   Nocturia 03/08/2016   Restless leg syndrome 03/08/2016   Chest tightness 08/21/2012    Past Surgical History:  Procedure Laterality Date   ABDOMINAL HYSTERECTOMY  02/1984   BACK SURGERY     CATARACT EXTRACTION, BILATERAL     COLONOSCOPY WITH PROPOFOL N/A 01/17/2023   Procedure: COLONOSCOPY WITH PROPOFOL;  Surgeon: Benancio Deeds, MD;  Location: [facility];  Service: Gastroenterology;  Laterality: N/A;   LEFT HEART CATH AND CORONARY ANGIOGRAPHY N/A 04/22/2021   Procedure: LEFT HEART CATH AND CORONARY ANGIOGRAPHY;  Surgeon: Kathleene Hazel, MD;  Location:  [facility];  Service: Cardiovascular;  Laterality: N/A;   LUMBAR LAMINECTOMY     OOPHORECTOMY  02/29/1984    unilateral   POLYPECTOMY  01/17/2023   Procedure: POLYPECTOMY;  Surgeon: Benancio Deeds, MD;  Location: [facility];  Service: Gastroenterology;;    Current Outpatient Medications  Medication Sig Dispense Refill   amLODipine (NORVASC) 5 MG tablet Take 5 mg by mouth daily as needed (High BP).     aspirin EC 81 MG tablet Take 1 tablet (81 mg total) by mouth daily. Swallow whole. 30 tablet 12   cyanocobalamin (VITAMIN B12) 1000 MCG tablet Take 1,000 mcg by mouth daily.     famotidine (PEPCID) 20 MG tablet Take 20 mg by mouth daily as needed for heartburn or indigestion.     losartan (COZAAR) 50 MG tablet Take 1 tablet (50 mg total) by mouth daily. (Patient taking differently: Take 50 mg by mouth daily as needed (High BP).) 30 tablet 1   Peppermint Oil (IBGARD) 90 MG CPCR Take 90 mg by mouth daily as needed (IBS).     Propylene Glycol (SYSTANE BALANCE) 0.6 % SOLN Place 1 drop into both eyes daily as needed (dry eye).     traZODone (DESYREL) 100 MG tablet Take 100 mg by mouth at bedtime.     zolpidem (AMBIEN) 10 MG tablet Take 10 mg by mouth at bedtime.     gabapentin (NEURONTIN) 300 MG capsule Take 1-2 capsules (300-600 mg total) by mouth 3 (three) times daily. 180 capsule 11   No current facility-administered medications for this visit.    Allergies as of 06/14/2023 - Review Complete 06/14/2023  Allergen Reaction Noted   Lexapro [escitalopram] Nausea Only and Other (See Comments) 12/02/2020   Protonix [pantoprazole] Other (See Comments) 12/02/2020   Naproxen sodium Anxiety 12/24/2011    Vitals: BP (!) 143/88   Pulse 71   Ht 5\' 3"  (1.6 m)   Wt 147 lb (66.7 kg)   BMI 26.04 kg/m  Last Weight:  Wt Readings from Last 1 Encounters:  06/14/23 147 lb (66.7 kg)   Last Height:   Ht Readings from Last 1 Encounters:  06/14/23 5\' 3"  (1.6 m)   Physical exam: Exam: Gen: NAD, conversant, well nourised, obese, well groomed                     CV: RRR, no MRG. No Carotid Bruits. No  peripheral edema, warm, nontender Eyes: Conjunctivae clear without exudates or hemorrhage  Neuro: Detailed Neurologic Exam  Speech:    Speech is normal; fluent and spontaneous with normal comprehension.  Cognition:    The patient is oriented to person, place, and time;     recent and remote memory intact;     language fluent;     normal attention, concentration,     fund of knowledge Cranial Nerves:    The pupils are equal, round, and reactive to light. The fundi are normal and spontaneous venous pulsations are present. Visual fields are full to finger confrontation. Extraocular movements are intact. Trigeminal sensation is intact and the muscles of mastication are normal. The face is symmetric. The palate elevates in the midline. Hearing intact. Voice is normal. Shoulder shrug is normal. The tongue has normal motion without fasciculations.   Coordination:    Normal finger to nose and heel to shin. Normal rapid alternating movements.   Gait: Impaired with imbalance cannot heel or toe walk. Motor Observation:    No asymmetry, no atrophy,  and no involuntary movements noted. Tone:    Normal muscle tone.    Posture:    Posture is normal. normal erect    Strength:    Right deltoids and triceps 4 out of 5, weak proximal legs, and left leg flexion weakness     Sensation: Decreased sensation in the right arm     Reflex Exam:  DTR's:    Deep tendon reflexes in the upper and lower extremities are brisk bilaterally.   Toes:    The toes are downgoing bilaterally.   Clonus: 2 beats clonus at the AJ's    Strength:    Right deltoid and triceps 4/5.  Weak prox legs left leg flexion     Sensation: decreased right arm     Reflex Exam:  DTR's:    Deep tendon reflexes in the upper and lower extremities are brisk bilaterally.  Right biceps and triceps are brisker than the left. Toes:    The toes are downgoing bilaterally.   Clonus: Bilat 2 beats AJ clonus     Right deltoid  and triceps 4/5.  Weak prox legs abd leg leg flexion Brisk reflexes + Clonus Right biceps and trieps brisker than left  Mild hoffman's on left Poor balance, heelt/toe  Assessment/Plan:   77 y.o. female here as requested for stable weakness. 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 Venetia Terry but declined surgery.She is here for the lacunar sroke, was not on asa 81mg  and was not treating her HLD likely due to small vessel disease. .. Her exam today shows brisk reflexes, upper and lower proximal muscle weakness, +clonus at the AJs, brisk reflexes right>> left, +Hoffman's sign. We talked a lot about stroke today, mitigating all risk factors, her spinal stenosis likely causing bilateral leg symptoms, physical therapy.  06/14/2023: Here for follow up. Will recheck B12. She has cervical stenosis, severe and she has symptoms and for years I have been telling her this, will send her to [facility]. She has already been to pain management. She wants oxycodone which really helped with her arms and leg paresthesias. She has tried: Physical therapy, tried for pain: gabapentin, cymbalta, tylenol, ibuprofen and various anlagesics, hydrocodone. Will increase Gabapentin. Can try Lyrica in the future. Send to pain management for pain management, and ortho Dr. Ophelia Terry for evaluation of decompression surgery (Dr. Venetia Terry in the past had wanted to perform this surgery and she declined and now he has retired)  Myelopathic exam with weakness in all 4 extremities the right deltoid and triceps 4 out of 5, weakness in the proximal legs and left leg flexion, she has very brisk reflexes and clonus, her right biceps and triceps are brisker than the left, she has a mild Hoffmann sign on the left, she has ataxia/poor balance, cannot heel toe.  Decreased sensation of the right arm.    Addedndum: 12/12/2022: Please call patient, her b12 is on the lower end and her methylmalonic  acid(MMA) is elevated which means she does still have B12 deficiency which can be seriou. She will have to take supplements throughout her life.  1000-2082mcg B12 daily po long term  recheck B12 today  B12 deficiency can cause weakness, fatigue, easy bruising or bleeding,sore tongue, stomach upset, weight loss, and diarrhea or constipation, tingling or numbness to the fingers and toes, difficulty walking, mood changes, depression, memory loss, disorientation and, in severe cases, dementia. Recommend 1000-2000 mcg B12 daily.   An elevated MMA  is a very sensitive and specific marker of B12 deficiency. There is evidence that up to 10% of individuals who have normal or low normal B12 values (between 150 and 400 pg/mL) may develop neuropsychiatric sequelae of B12 deficiency without any evidence of megaloblastic anemia. The MMA test can identify this population of functionally B12-deficient individuals. In these patients, oral cyanocobalamin 1-2 mg/day will normalize the MMA and hopefully improve their symptoms   Stroke: Take lipitor prescribed (she has not, I highly encouraged her), ldl 162 and have to get it down to < 70. Recheck in 3 months. Come in morning fasting 8am.  Take daily baby aspirin for stroke prevention daily MRI cervical spine and low back for stenosis: In the past severe stenosis in the cervical spine and lumbar spine with myelopathic signs Xanax for claustrophobia Physical therapy - [facility]  I had a long d/w patient about her recent stroke, risk for recurrent stroke/TIAs, personally independently reviewed imaging studies and stroke evaluation results and answered questions.Continue ASA for secondary stroke prevention and maintain strict control of hypertension with blood pressure goal below 130/90, diabetes with hemoglobin A1c goal below 6.5% and lipids with LDL cholesterol goal below 70 mg/dL.I also advised the patient to eat a healthy diet with plenty of whole grains, lean meats,  fruits and vegetables, exercise regularly and maintain ideal body weight   Meds ordered this encounter  Medications   gabapentin (NEURONTIN) 300 MG capsule    Sig: Take 1-2 capsules (300-600 mg total) by mouth 3 (three) times daily.    Dispense:  180 capsule    Refill:  11   Orders Placed This Encounter  Procedures   B12 and Folate Panel   Methylmalonic acid, serum   Ambulatory referral to Orthopedic Surgery   Ambulatory referral to Pain Clinic   I spent over 45 minutes of face-to-face and non-face-to-face time with patient on the  1. Cervical myelopathy (HCC)   2. B12 deficiency   3. Cervical spinal stenosis   4. Paresthesias   5. Weakness of both lower limbs     diagnosis.  This included previsit chart review, lab review, study review, order entry, electronic health record documentation, patient education on the different diagnostic and therapeutic options, counseling and coordination of care, risks and benefits of management, compliance, or risk factor reduction    Cc: Kayla Banner, MD  Naomie Dean, MD  [facility] [address] [place], [place] [address]  Phone [phone] Fax [phone]  I spent over 45 minutes of face-to-face and non-face-to-face time with patient on the  1. Cervical myelopathy (HCC)   2. B12 deficiency   3. Cervical spinal stenosis   4. Paresthesias   5. Weakness of both lower limbs       diagnosis.  This included previsit chart review, lab review, study review, order entry, electronic health record documentation, patient education on the different diagnostic and therapeutic options, counseling and coordination of care, risks and benefits of management, compliance, or risk factor reduction

## 2023-06-14 NOTE — Patient Instructions (Addendum)
Increased gabapentin Referral to Dr. [name] for neck Referrral to Pain clinic  Meds ordered this encounter  Medications   gabapentin (NEURONTIN) 300 MG capsule    Sig: Take 1-2 capsules (300-600 mg total) by mouth 3 (three) times daily.    Dispense:  180 capsule    Refill:  11   Orders Placed This Encounter  Procedures   B12 and Folate Panel   Methylmalonic acid, serum   Ambulatory referral to Orthopedic Surgery   Ambulatory referral to Pain Clinic   Spinal Stenosis  Spinal stenosis is a condition that happens when the spinal canal narrows. The spinal canal is the space between the bones of your spine (vertebrae). This narrowing puts pressure on the spinal cord and nerves that exit the spine and run down the arms or legs. When nerves exiting the spine are pinched, it can cause pain, numbness, or weakness in the arms or legs. Spinal stenosis can affect the vertebrae in the neck, upper back, and lower back. Spinal stenosis can range from mild to severe. What are the causes? This condition is caused by areas of bone pushing into the spinal canal. This condition may be present at birth (congenital), or it may be caused by: Slow breakdown of your vertebrae (spinal degeneration). This usually starts between 60 and 17 years of age. Injury (trauma) to your spine. Previous spinal surgery. Tumors in your spine. Calcium deposits in your spine. What increases the risk? The following factors may make you more likely to develop this condition: Being older than age 65. Being born with an abnormally shaped spine (congenitalspinal deformity), such as scoliosis. Having arthritis. What are the signs or symptoms? Pain in the neck or back that is generally worse with activities, particularly when standing or walking. Numbness, tingling, hot or cold sensations, weakness, or tiredness (fatigue) in your arms or legs. This can happen in one arm or leg, or both. Pain going from the buttock, down the thigh,  and to the calf (sciatica). This can happen in one or both legs. Falling frequently. Foot drop. This is when you have trouble lifting the front part of your foot and it drags on the ground when you walk. This can lead to muscle weakness. In more severe cases, you may develop: Problems having a bowel movement or urinating. Difficulty having sex. Loss of feeling in your legs and inability to walk. Symptoms may come on slowly and get worse over time. In some cases, there are no symptoms. How is this diagnosed? This condition is diagnosed based on your medical history and a physical exam. You may also have tests, such as an X-ray, CT scan, or MRI. How is this treated? Treatment for this condition often focuses on managing your pain and any other symptoms. Treatment may include: Practicing good posture to lessen pressure on your nerves. Exercises to strengthen muscles, build endurance, improve balance, and maintain range of motion. This may include physical therapy to restore movement and strength to your back. Losing weight, if needed. Medicines to reduce inflammation or pain. This may include a medicine that is injected into your spine (steroidinjection). Assistive devices, such as a corset or brace. In some cases, surgery may be needed. The most common procedure is decompression laminectomy. This removes excess bone that puts pressure on your nerve roots. Follow these instructions at home: Managing pain, stiffness, and swelling  Practice good posture. If you were given a brace or a corset, wear it as told by your health care provider. Maintain  a healthy weight. Talk with your health care provider if you need help losing weight. If directed, apply heat to the affected area as often as told by your health care provider. Use the heat source that your health care provider recommends, such as a moist heat pack or a heating pad. Place a towel between your skin and the heat source. Leave the heat on  for 20-30 minutes. If your skin turns bright red, remove the heat right away to prevent burns. The risk of burns is higher if you cannot feel pain, heat, or cold. Activity Do all exercises and stretches as told by your health care provider. Do not do any activities that cause pain. You may have to avoid lifting. Ask your health care provider how much you can safely lift. Return to your normal activities as told by your health care provider. Ask your health care provider what activities are safe for you. General instructions Take over-the-counter and prescription medicines only as told by your health care provider. Do not use any products that contain nicotine or tobacco. These products include cigarettes, chewing tobacco, and vaping devices, such as e-cigarettes. If you need help quitting, ask your health care provider. Eat a healthy diet. This includes plenty of fruits and vegetables, whole grains, and low-fat (lean) protein. Where to find more information [employer] of Arthritis and Musculoskeletal and Skin Diseases: www.niams.http://www.myers.net/ Contact a health care provider if: Your symptoms do not get better or they get worse. You have a fever. Get help right away if: You have new pain or symptoms of severe pain, such as: New or worsening pain in your neck or upper back. Severe pain that cannot be controlled with medicines. A severe headache that gets worse when you stand. You are dizzy. You have vision problems, such as blurred vision or double vision. You have nausea or vomiting. You develop new or worsening numbness or tingling in your back or legs. You lose control of your bowels or bladder. You have pain, redness, swelling, or warmth in your arm or leg. These symptoms may be an emergency. Get help right away. Call 911. Do not wait to see if the symptoms will go away. Do not drive yourself to the hospital. Summary Spinal stenosis is a condition that happens when the spinal canal  narrows, putting pressure on the spinal cord or nerves that exit the vertebrae. This condition is caused by areas of bone pushing into the spinal canal. Spinal stenosis can cause numbness, weakness, or pain in the buttocks, neck, back, arms, and legs. This condition is usually diagnosed with your medical history, a physical exam, and tests, such as an X-ray, CT scan, or MRI. This information is not intended to replace advice given to you by your health care provider. Make sure you discuss any questions you have with your health care provider. Document Revised: 01/11/2022 Document Reviewed: 01/11/2022 Elsevier Patient Education  2024 [employer].

## 2023-06-15 ENCOUNTER — Telehealth: Payer: Self-pay | Admitting: Neurology

## 2023-06-15 NOTE — Telephone Encounter (Signed)
Referral for pain clinic sent to [facility] due to there were not pain clinic in [place]. Phone: [phone], Fax: [phone].

## 2023-06-20 ENCOUNTER — Telehealth: Payer: Self-pay | Admitting: Neurology

## 2023-06-20 NOTE — Telephone Encounter (Signed)
Referral was sent to Dr. [name] at [facility]. Received this message through Epic today:  Thank you for trusting the care of your patient to our office. However we have made three unsuccessful attempts to contact the patient, to schedule an appointment. At this time we will close the referral, if an appointment is still needed please have the patient contact our office at the referral line [phone].   Bonnita Nasuti  Referral Coordinator  Cyndia Skeeters  [phone]

## 2023-06-21 LAB — METHYLMALONIC ACID, SERUM: Methylmalonic Acid: 328 nmol/L (ref 0–378)

## 2023-06-21 LAB — B12 AND FOLATE PANEL
Folate: 9.3 ng/mL (ref >3.0)
Vitamin B-12: 447 pg/mL (ref 232–1245)

## 2023-06-26 NOTE — Telephone Encounter (Signed)
Received a call from [place] from [facility], pt said [facility] ([place] is too far, would need somewhere closed.  Refax referral for pain clinic to [facility]. Phone: [phone], Fax: [phone]

## 2023-06-27 ENCOUNTER — Telehealth: Payer: Self-pay

## 2023-06-27 NOTE — Telephone Encounter (Signed)
I called and spoke with the patient to inform her of the results of her lab test. She verbalized understanding and expressed appreciation for the call.

## 2023-06-27 NOTE — Telephone Encounter (Signed)
-----   Message from Anson Fret sent at 06/21/2023 11:37 AM EDT ----- B12 is 447 which is normal (like to keep it above 400) thanks.

## 2023-07-04 NOTE — Telephone Encounter (Signed)
Scheduled at [facility] for 9/23 at 1pm with [facility].

## 2023-07-10 ENCOUNTER — Encounter (HOSPITAL_BASED_OUTPATIENT_CLINIC_OR_DEPARTMENT_OTHER): Payer: Self-pay | Admitting: Emergency Medicine

## 2023-07-10 ENCOUNTER — Other Ambulatory Visit: Payer: Self-pay

## 2023-07-10 ENCOUNTER — Emergency Department (HOSPITAL_BASED_OUTPATIENT_CLINIC_OR_DEPARTMENT_OTHER)
Admission: EM | Admit: 2023-07-10 | Discharge: 2023-07-10 | Disposition: A | Discharge status: Home / Self Care | Payer: Medicare Other | Attending: Emergency Medicine | Admitting: Emergency Medicine

## 2023-07-10 ENCOUNTER — Emergency Department (HOSPITAL_BASED_OUTPATIENT_CLINIC_OR_DEPARTMENT_OTHER): Payer: Medicare Other

## 2023-07-10 DIAGNOSIS — M545 Low back pain, unspecified: Secondary | ICD-10-CM | POA: Insufficient documentation

## 2023-07-10 DIAGNOSIS — R109 Unspecified abdominal pain: Secondary | ICD-10-CM | POA: Diagnosis not present

## 2023-07-10 DIAGNOSIS — Z79899 Other long term (current) drug therapy: Secondary | ICD-10-CM | POA: Insufficient documentation

## 2023-07-10 DIAGNOSIS — I10 Essential (primary) hypertension: Secondary | ICD-10-CM | POA: Insufficient documentation

## 2023-07-10 DIAGNOSIS — Z7982 Long term (current) use of aspirin: Secondary | ICD-10-CM | POA: Diagnosis not present

## 2023-07-10 LAB — URINALYSIS, ROUTINE W REFLEX MICROSCOPIC
Bilirubin Urine: NEGATIVE
Glucose, UA: NEGATIVE mg/dL
Hgb urine dipstick: NEGATIVE
Ketones, ur: NEGATIVE mg/dL
Leukocytes,Ua: NEGATIVE
Nitrite: NEGATIVE
Protein, ur: NEGATIVE mg/dL
Specific Gravity, Urine: <1.005 — ABNORMAL LOW (ref 1.005–1.030)
pH: 6 (ref 5.0–8.0)

## 2023-07-10 LAB — CBC
HCT: 40.6 % (ref 36.0–46.0)
Hemoglobin: 13.6 g/dL (ref 12.0–15.0)
MCH: 28.5 pg (ref 26.0–34.0)
MCHC: 33.5 g/dL (ref 30.0–36.0)
MCV: 84.9 fL (ref 80.0–100.0)
Platelets: 317 10*3/uL (ref 150–400)
RBC: 4.78 MIL/uL (ref 3.87–5.11)
RDW: 14.6 % (ref 11.5–15.5)
WBC: 7.6 10*3/uL (ref 4.0–10.5)
nRBC: 0 % (ref 0.0–0.2)

## 2023-07-10 LAB — COMPREHENSIVE METABOLIC PANEL
ALT: 10 U/L (ref 0–44)
AST: 16 U/L (ref 15–41)
Albumin: 4.9 g/dL (ref 3.5–5.0)
Alkaline Phosphatase: 87 U/L (ref 38–126)
Anion gap: 13 (ref 5–15)
BUN: 10 mg/dL (ref 8–23)
CO2: 25 mmol/L (ref 22–32)
Calcium: 10 mg/dL (ref 8.9–10.3)
Chloride: 97 mmol/L — ABNORMAL LOW (ref 98–111)
Creatinine, Ser: 0.94 mg/dL (ref 0.44–1.00)
GFR, Estimated: >60 mL/min (ref >60)
Glucose, Bld: 141 mg/dL — ABNORMAL HIGH (ref 70–99)
Potassium: 4.1 mmol/L (ref 3.5–5.1)
Sodium: 135 mmol/L (ref 135–145)
Total Bilirubin: 0.4 mg/dL (ref 0.3–1.2)
Total Protein: 7.7 g/dL (ref 6.5–8.1)

## 2023-07-10 LAB — LIPASE, BLOOD: Lipase: 16 U/L (ref 11–51)

## 2023-07-10 MED ORDER — LIDOCAINE 4 % EX PTCH: 1.0000 | MEDICATED_PATCH | CUTANEOUS | 0 refills | Status: AC

## 2023-07-10 MED ORDER — IOHEXOL 300 MG/ML  SOLN
100.0000 mL | Freq: Once | INTRAMUSCULAR | Status: AC | PRN
  Administered 2023-07-10: 100 mL via INTRAVENOUS

## 2023-07-10 MED ORDER — OXYCODONE-ACETAMINOPHEN 5-325 MG PO TABS
1.0000 | ORAL_TABLET | Freq: Once | ORAL | Status: AC
  Administered 2023-07-10: 1 via ORAL
  Filled 2023-07-10: qty 1

## 2023-07-10 MED ORDER — CEPHALEXIN 500 MG PO CAPS: 500.0000 mg | ORAL_CAPSULE | Freq: Four times a day (QID) | ORAL | 0 refills | Status: AC

## 2023-07-10 MED ORDER — LIDOCAINE 5 % EX PTCH
1.0000 | MEDICATED_PATCH | CUTANEOUS | Status: DC
  Administered 2023-07-10: 1 via TRANSDERMAL
  Filled 2023-07-10: qty 1

## 2023-07-10 MED ORDER — MORPHINE SULFATE (PF) 2 MG/ML IV SOLN
2.0000 mg | Freq: Once | INTRAVENOUS | Status: AC
  Administered 2023-07-10: 2 mg via INTRAVENOUS
  Filled 2023-07-10: qty 1

## 2023-07-10 MED ORDER — ACETAMINOPHEN 500 MG PO TABS
1000.0000 mg | ORAL_TABLET | Freq: Once | ORAL | Status: AC
  Administered 2023-07-10: 1000 mg via ORAL
  Filled 2023-07-10: qty 2

## 2023-07-10 NOTE — ED Provider Notes (Signed)
Received handoff; disposition pendingCT scan.  Brief summary 77 year old with low back/abdominal pain.  Rule out diverticulitis on CT scan.  Received medications and is feeling improved.  On my examination, continues to feel well.  No nausea no vomiting.  She did note that she has had some urinary frequency over the past several days.  Physical Exam  BP (!) 158/84   Pulse 64   Temp 98.2 F (36.8 C) (Oral)   Resp 16   Ht 5\' 3"  (1.6 m)   Wt 66.7 kg   SpO2 98%   BMI 26.05 kg/m   Physical Exam Vitals reviewed.  HENT:     Head: Normocephalic.  Cardiovascular:     Rate and Rhythm: Normal rate and regular rhythm.  Pulmonary:     Effort: Pulmonary effort is normal.  Abdominal:     General: Abdomen is flat.     Palpations: Abdomen is soft.     Tenderness: There is abdominal tenderness in the right lower quadrant, suprapubic area and left lower quadrant. There is no guarding or rebound.  Skin:    General: Skin is warm.     Capillary Refill: Capillary refill takes less than 2 seconds.  Neurological:     Mental Status: She is alert.     Procedures  Procedures  ED Course / MDM   Clinical Course as of 07/10/23 1842  Mon Jul 10, 2023  1611 Signed out to Dr Maple Hudson [RP]  1825 CT ABDOMEN PELVIS W CONTRAST IMPRESSION: 1. No acute intra-abdominal pathology identified. No definite radiographic explanation for the patient's reported symptoms. 2. Moderate sigmoid diverticulosis without superimposed acute inflammatory change   [TY]    Clinical Course User Index [RP] Rondel Baton, MD [TY] Coral Spikes, DO   Medical Decision Making 77 year old female signed out to me from morning team.  See their note for further HPI.  Afebrile vital signs reassuring.  Physical exam with soft benign abdomen.  She has good strength and sensation in lower extremities.  Low suspicion for acute cord compression, she does note history of arthritis and takes gabapentin.  Suspect likely  musculoskeletal etiology.  However she also reports some minor dysuria and frequency.  Her urinalysis today today does not overtly appear consistent with UTI.  However given symptoms we will treat with antibiotics.  Labs reviewed, no significant metabolic derangements.  Normal kidney function.  AST ALT normal.  Hepatobiliary disease unlikely.  Lipase normal.  Pancreatitis unlikely.  No fever tachycardia leukocytosis to suggest systemic infection.  CT scan today with no acute pathology.  Will discharge with lidocaine patches.  Amount and/or Complexity of Data Reviewed Labs: ordered. Radiology: ordered. Decision-making details documented in ED Course.  Risk OTC drugs. Prescription drug management.         Coral Spikes, DO 07/10/23 [phone]

## 2023-07-10 NOTE — ED Notes (Signed)
Pt requesting update from provider. Messaged EDP Young

## 2023-07-10 NOTE — ED Provider Notes (Signed)
[name] AT [facility] Provider Note   CSN: [id number] Arrival date & time: 07/10/23  1144     History {Add pertinent medical, surgical, social history, OB history to HPI:1} Chief Complaint  Patient presents with   Abdominal Pain   Flank Pain    Kayla Terry is a 77 y.o. female.  77 year old female with history of stroke without residual deficits, diverticulitis, hypertension, and neuropathy presents to the emergency department with abdominal and back pain.  Patient reports that over the past days she has started to feel it being lower abdominal pain.  Describes it as constant.  No exacerbating or alleviating factors.  Currently 7/10 in severity.  Is a pressure-like sensation.  Also over the past week has been having some worsening back pain.  Says that it radiates to her abdomen as well.  No fevers, nausea or vomiting, diarrhea, bowel or bladder incontinence, new lower extremity weakness or numbness, or saddle anesthesia.  No history of cancer.       Home Medications Prior to Admission medications   Medication Sig Start Date End Date Taking? Authorizing Provider  amLODipine (NORVASC) 5 MG tablet Take 5 mg by mouth daily as needed (High BP).    [provider]  aspirin EC 81 MG tablet Take 1 tablet (81 mg total) by mouth daily. Swallow whole. 10/22/22   Johnson, Clanford L, MD  cyanocobalamin (VITAMIN B12) 1000 MCG tablet Take 1,000 mcg by mouth daily.    [provider]  famotidine (PEPCID) 20 MG tablet Take 20 mg by mouth daily as needed for heartburn or indigestion.    [provider]  gabapentin (NEURONTIN) 300 MG capsule Take 1-2 capsules (300-600 mg total) by mouth 3 (three) times daily. 06/14/23   Anson Fret, MD  losartan (COZAAR) 50 MG tablet Take 1 tablet (50 mg total) by mouth daily. Patient taking differently: Take 50 mg by mouth daily as needed (High BP). 10/22/22   Johnson, Clanford L, MD  Peppermint Oil  (IBGARD) 90 MG CPCR Take 90 mg by mouth daily as needed (IBS).    [provider]  Propylene Glycol (SYSTANE BALANCE) 0.6 % SOLN Place 1 drop into both eyes daily as needed (dry eye).    [provider]  traZODone (DESYREL) 100 MG tablet Take 100 mg by mouth at bedtime. 03/02/22   [provider]  zolpidem (AMBIEN) 10 MG tablet Take 10 mg by mouth at bedtime.    [provider]      Allergies    Lexapro [escitalopram], Protonix [pantoprazole], and Naproxen sodium    Review of Systems   Review of Systems  Physical Exam Updated Vital Signs BP (!) 160/103 (BP Location: Right Arm)   Pulse 96   Temp 98 F (36.7 C) (Oral)   Resp 18   Ht 5\' 3"  (1.6 m)   Wt 66.7 kg   SpO2 97%   BMI 26.05 kg/m  Physical Exam Vitals and nursing note reviewed.  Constitutional:      General: She is not in acute distress.    Appearance: She is well-developed.  HENT:     Head: Normocephalic and atraumatic.     Right Ear: External ear normal.     Left Ear: External ear normal.     Nose: Nose normal.  Eyes:     Extraocular Movements: Extraocular movements intact.     Conjunctiva/sclera: Conjunctivae normal.     Pupils: Pupils are equal, round, and reactive to light.  Cardiovascular:     Rate and Rhythm: Normal rate and regular rhythm.  Pulmonary:     Effort: Pulmonary effort is normal. No respiratory distress.  Abdominal:     General: Abdomen is flat. There is no distension.     Palpations: Abdomen is soft. There is no mass.     Tenderness: There is abdominal tenderness (Mild, suprapubic). There is no guarding.  Musculoskeletal:     Cervical back: Normal range of motion and neck supple.     Right lower leg: No edema.     Left lower leg: No edema.     Comments: No spinal midline TTP in cervical, thoracic, or lumbar spine. No stepoffs noted.   Motor: Muscle bulk and tone are normal. Strength is 5/5 in hip flexion, knee flexion and extension, ankle dorsiflexion and  plantar flexion bilaterally. Full strength of great toe dorsiflexion bilaterally.  Sensory: Intact sensation to light touch in L2 though S1 dermatomes bilaterally.   Skin:    General: Skin is warm and dry.  Neurological:     Mental Status: She is alert and oriented to person, place, and time. Mental status is at baseline.  Psychiatric:        Mood and Affect: Mood normal.     ED Results / Procedures / Treatments   Labs (all labs ordered are listed, but only abnormal results are displayed) Labs Reviewed  COMPREHENSIVE METABOLIC PANEL - Abnormal; Notable for the following components:      Result Value   Chloride 97 (*)    Glucose, Bld 141 (*)    All other components within normal limits  URINALYSIS, ROUTINE W REFLEX MICROSCOPIC - Abnormal; Notable for the following components:   Color, Urine COLORLESS (*)    Specific Gravity, Urine <1.005 (*)    All other components within normal limits  LIPASE, BLOOD  CBC    EKG None  Radiology No results found.  Procedures Procedures  {Document cardiac monitor, telemetry assessment procedure when appropriate:1}  Medications Ordered in ED Medications - No data to display  ED Course/ Medical Decision Making/ A&P   {   Click here for ABCD2, HEART and other calculatorsREFRESH Note before signing :1}                              Medical Decision Making Amount and/or Complexity of Data Reviewed Labs: ordered. Radiology: ordered.  Risk OTC drugs. Prescription drug management.   ***  {Document critical care time when appropriate:1} {Document review of labs and clinical decision tools ie heart score, Chads2Vasc2 etc:1}  {Document your independent review of radiology images, and any outside records:1} {Document your discussion with family members, caretakers, and with consultants:1} {Document social determinants of health affecting pt's care:1} {Document your decision making why or why not admission, treatments were  needed:1} Final Clinical Impression(s) / ED Diagnoses Final diagnoses:  None    Rx / DC Orders ED Discharge Orders     None

## 2023-07-10 NOTE — ED Notes (Signed)
Patient transported to CT 

## 2023-07-10 NOTE — ED Notes (Signed)
ED Provider at bedside. 

## 2023-07-10 NOTE — Discharge Instructions (Signed)
Take over-the-counter Tylenol alternating with Motrin for pain.  You may also use lidocaine patch.  Will also give antibiotics for presumed UTI.  Please follow-up with your primary doctor soon as possible.  Return immediately for fevers, chills, chest pain, shortness of breath, worsening abdominal pain, nausea vomiting, or, please return to healthy numbness tingling changes in station or lower extremities, bowel or bladder incontinence or any new or worsening symptoms that are concerning to you.

## 2023-07-10 NOTE — ED Notes (Signed)
Pt ambulated with standby assist to restroom ?

## 2023-07-10 NOTE — ED Triage Notes (Signed)
Pt arrives to ED with c/o bilateral flank pain x1 week and x1 day of lower abdominal pain. Associated with urinary urgency.

## 2023-07-25 ENCOUNTER — Ambulatory Visit: Payer: Medicare Other | Admitting: Orthopaedic Surgery

## 2023-07-25 ENCOUNTER — Telehealth: Payer: Self-pay | Admitting: Neurology

## 2023-07-25 NOTE — Telephone Encounter (Signed)
Pt is asking for a call to clarify if it is for her lower or upper back as to why Dr Lucia Gaskins referred her to [facility]

## 2023-07-25 NOTE — Telephone Encounter (Signed)
LVM for pt to call back to discuss mychart message she sent

## 2023-07-25 NOTE — Telephone Encounter (Signed)
The patient returned our call this afternoon.  We discussed that she has been scheduled with Dr. [name] for October 15 at 9:15 AM.  She reports she originally had an appointment scheduled on the same day as the pain management so she rescheduled.  She states that she saw [facility] pain management yesterday and they suggested a steroid injection in the L4-5 area. She asked if the referral was for her low back. I advised that it was for her chronic pain and I did see that she told Dr Lucia Gaskins she wasn't having any low back pain but rather trouble with her legs. She states that she recently started having some discomfort around her waist area. she wants to know if [name] feels the injection would help her back. She also mentioned that she found out she had an injection June 2023 in the low back by [name].  She states his office had recommended that she was not sure it was very helpful.  I did explain to her that the low back injection may very well help her symptoms in that area or lower but it would not treat her cervical stenosis and it is imperative that she see Dr. [name] for this.  She verbalized understanding and appreciation.  Her questions were answered during the call.

## 2023-07-26 NOTE — Telephone Encounter (Signed)
I would discuss efficacy with the provider who is injecting and yes I would see the provider and discuss and consider.

## 2023-07-26 NOTE — Telephone Encounter (Signed)
I called the patient back and discussed the message below by [name].  Patient verbalized understanding and appreciation for the call.

## 2023-07-27 ENCOUNTER — Telehealth: Payer: Self-pay | Admitting: Gastroenterology

## 2023-07-27 MED ORDER — AMOXICILLIN-POT CLAVULANATE 875-125 MG PO TABS: 1.0000 | ORAL_TABLET | Freq: Two times a day (BID) | ORAL | 0 refills | Status: AC

## 2023-07-27 NOTE — Telephone Encounter (Signed)
Okay to give Augmentin twice daily for 7-day course to treat suspected diverticulitis if she feels her symptoms are consistent with this which she has experienced in the past.  Thanks

## 2023-07-27 NOTE — Telephone Encounter (Signed)
Inbound call from patient stating she is have a diverticulitis flare up. Patient requesting a call back to discuss a medication that has been called into her previously. Patient is requesting to have same medication called in. Please advise, thank you.

## 2023-07-27 NOTE — Telephone Encounter (Signed)
Augmentin prescription sent to pharmacy on file. Left patient a detailed vm on her home phone letting her know that prescription has been sent, pt has been advised to call back if symptoms persist despite completion of Augmentin for 1 week.

## 2023-08-15 ENCOUNTER — Ambulatory Visit: Payer: Medicare Other | Admitting: Orthopaedic Surgery

## 2023-08-15 ENCOUNTER — Other Ambulatory Visit: Payer: Self-pay

## 2023-08-15 VITALS — BP 128/86 | HR 70

## 2023-08-15 DIAGNOSIS — M48062 Spinal stenosis, lumbar region with neurogenic claudication: Secondary | ICD-10-CM | POA: Diagnosis not present

## 2023-08-15 DIAGNOSIS — M5441 Lumbago with sciatica, right side: Secondary | ICD-10-CM

## 2023-08-15 DIAGNOSIS — G8929 Other chronic pain: Secondary | ICD-10-CM | POA: Diagnosis not present

## 2023-08-15 DIAGNOSIS — M5442 Lumbago with sciatica, left side: Secondary | ICD-10-CM | POA: Diagnosis not present

## 2023-08-15 DIAGNOSIS — M48061 Spinal stenosis, lumbar region without neurogenic claudication: Secondary | ICD-10-CM | POA: Insufficient documentation

## 2023-08-15 NOTE — Progress Notes (Signed)
Office Visit Note   Patient: Kayla Terry           Date of Birth: 12-03-1945           MRN: [id number] Visit Date: 08/15/2023              Requested by: Anson Fret, MD [address] [place],  [place] [address] PCP: [name], MD   Assessment & Plan: Visit Diagnoses:  1. Chronic bilateral low back pain with bilateral sciatica   2. Spinal stenosis of lumbar region with neurogenic claudication   2.      History of CVA with mild right-sided residual weakness  Plan: Patient's probably worthwhile trying 1 epidural.  Generally epidurals are not usually very effective with spinal stenosis.  She does have spinal stenosis but presently she is able to ambulate wherever she like and can stand in most cases long as she needs to for what ever she is doing.  I discussed with her that I would not recommend starting narcotic medication and taking it regularly.  I would recommend some physical therapy for strengthening of her legs.  If her claudication symptoms progress so that she is able to only walk short distance or only could stand for short time that bothers her with activities during the day then surgery could be reconsidered.  I obtained some plain radiographs which showed 2 to 3 mm of anterolisthesis from her degenerative facets and she would be at some risk for slipping further with decompression surgery and might have to have a fusion if that did occur.  I discussed with her that at this point I agree with [name] that surgery is not recommended at this point but will set up for some therapy and I can recheck her in 7 weeks.  Follow-Up Instructions: Return in about 7 weeks (around 10/03/2023).   Orders:  Orders Placed This Encounter  Procedures   XR Lumbar Spine 2-3 Views   No orders of the defined types were placed in this encounter.     Procedures: No procedures performed   Clinical Data: No additional findings.   Subjective: Chief Complaint  Patient  presents with   Lower Back - Pain    HPI 77 year old female with history of CVA December 2023 with right side weakness had home therapy for several months.  She has had problems with tingling in her legs some problems with prolonged standing.  She has a cane and a walker but most of the time just hangs on a cart when she goes someplace.  She states she is able to stand and cook or be upright at home and does not have classic claudication symptoms.  She states she walks and goes places that she wants to go.  If she shops at multiple store she does notice she has to stop and sit down for a few minutes and then can resume activity.  She is seeing [name] after her last MRI scan and she relates that he did not think surgery was going to be helpful at this point.  Patient states she has some right and residual numbness notices some weakness problems opening jars.  She has had an MRI scan cervical spine 01/08/2023 as well as lumbar spine.  Patient has spondylosis at C5-6 C6-7 with some foraminal narrowing and moderate central narrowing.  No cord changes were noted.  Lumbar MRI scan showed severe stenosis at L4-5 with bilateral facet arthropathy.  Mild changes at other  levels.  Stenosis at L4-5 was multifactorial primarily hypertrophic ligamentum.  Patient states she was referred to pain clinic they gave her some hydrocodone.  She is scheduled for epidural injection next week.  Review of Systems all other systems noncontributory to HPI.   Objective: Vital Signs: BP 128/86   Pulse 70   Physical Exam Constitutional:      Appearance: She is well-developed.  HENT:     Head: Normocephalic.     Right Ear: External ear normal.     Left Ear: External ear normal. There is no impacted cerumen.  Eyes:     Pupils: Pupils are equal, round, and reactive to light.  Neck:     Thyroid: No thyromegaly.     Trachea: No tracheal deviation.  Cardiovascular:     Rate and Rhythm: Normal rate.  Pulmonary:      Effort: Pulmonary effort is normal.  Abdominal:     Palpations: Abdomen is soft.  Musculoskeletal:     Cervical back: No rigidity.  Skin:    General: Skin is warm and dry.  Neurological:     Mental Status: She is alert and oriented to person, place, and time.  Psychiatric:        Behavior: Behavior normal.     Ortho Exam patient is able to ambulate she is a little unsteady on her feet.  Dorsiflexion plantarflexion is intact right and left.  Specialty Comments:  No specialty comments available.  Imaging: No results found.   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/06/2022   COVID-19 virus infection 04/23/2021   Coronary artery disease 04/23/2021   Chest pain 04/22/2021   Chest pain of uncertain etiology    Nonspecific abnormal electrocardiogram (ECG) (EKG)    2nd degree AV block 03/25/2021   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   B12 deficiency 05/07/2020   Muscle weakness 05/06/2020   Benign essential hypertension 06/08/2017   Acid reflux disease 03/08/2016   Hyperlipidemia 03/08/2016   Nocturia 03/08/2016   Restless leg syndrome 03/08/2016   Chest tightness 08/21/2012   Past Medical History:  Diagnosis Date   Allergy    B12 deficiency 04/2020   Cataract    Coronary artery disease    COVID-19 04/22/2021   Diverticulitis    10/19/2022   Fatty liver    GERD (gastroesophageal reflux disease)    Herpes zoster without complications    Hiatal hernia    Hypertension    Mild mitral regurgitation 2013   Mobitz type 2 second degree atrioventricular block    Pectus excavatum    pectus excavatum  deformity resulting in mass effect on the RV by CT 03/2020   Pulmonary nodule    Shingles    Stroke [facility])    10/19/2022    Family History  Problem Relation Age of Onset   Cancer Mother    Leukemia Mother    Lung cancer Father    Multiple myeloma Father    Multiple sclerosis Sister    Alcohol abuse Brother    Multiple myeloma Brother    Colon cancer Neg Hx    Colon polyps Neg Hx    Stomach  cancer Neg Hx    Esophageal cancer Neg Hx    Neuropathy Neg Hx    Stroke Neg Hx     Past Surgical History:  Procedure Laterality Date   ABDOMINAL HYSTERECTOMY  02/1984   BACK SURGERY     CATARACT EXTRACTION, BILATERAL     COLONOSCOPY WITH PROPOFOL N/A 01/17/2023   Procedure: COLONOSCOPY WITH PROPOFOL;  Surgeon: Benancio Deeds, MD;  Location: [facility];  Service: Gastroenterology;  Laterality: N/A;   LEFT HEART CATH AND CORONARY ANGIOGRAPHY N/A 04/22/2021   Procedure: LEFT HEART CATH AND CORONARY ANGIOGRAPHY;  Surgeon: Kathleene Hazel, MD;  [facility];  Service: Cardiovascular;  Laterality: N/A;   LUMBAR LAMINECTOMY     OOPHORECTOMY  02/29/1984   unilateral   POLYPECTOMY  01/17/2023   Procedure: POLYPECTOMY;  Surgeon: Benancio Deeds, MD;  Location: [facility];  Service: Gastroenterology;;   Social History   Occupational History   Not on file  Tobacco Use   Smoking status: Never   Smokeless tobacco: Never  Vaping Use   Vaping status: Never Used  Substance and Sexual Activity   Alcohol use: No   Drug use: No   Sexual activity: Not on file

## 2023-08-25 ENCOUNTER — Ambulatory Visit (HOSPITAL_COMMUNITY): Payer: Medicare Other

## 2023-10-03 ENCOUNTER — Ambulatory Visit: Payer: Medicare Other | Admitting: Orthopaedic Surgery

## 2023-11-02 ENCOUNTER — Encounter: Payer: Self-pay | Admitting: Physical Medicine and Rehabilitation

## 2023-11-02 ENCOUNTER — Ambulatory Visit: Payer: Medicare Other | Admitting: Physical Medicine and Rehabilitation

## 2023-11-02 VITALS — BP 152/87 | HR 70

## 2023-11-02 DIAGNOSIS — M5416 Radiculopathy, lumbar region: Secondary | ICD-10-CM | POA: Diagnosis not present

## 2023-11-02 DIAGNOSIS — M5441 Lumbago with sciatica, right side: Secondary | ICD-10-CM | POA: Diagnosis not present

## 2023-11-02 DIAGNOSIS — M5442 Lumbago with sciatica, left side: Secondary | ICD-10-CM | POA: Diagnosis not present

## 2023-11-02 DIAGNOSIS — G8929 Other chronic pain: Secondary | ICD-10-CM | POA: Diagnosis not present

## 2023-11-02 DIAGNOSIS — M48062 Spinal stenosis, lumbar region with neurogenic claudication: Secondary | ICD-10-CM

## 2023-11-02 NOTE — Progress Notes (Signed)
 Functional Pain Scale - descriptive words and definitions  Immobilizing (10)   Unable to move or talk due to intensity of pain/unable to sleep and unable to use distraction. Severe range order  Average Pain 10  LBP with soreness and burning, legs feel like they are asleep and the R is worse. She also has so pain in the groin area. Over the weekend the pain has gotten worse.

## 2023-11-02 NOTE — Progress Notes (Signed)
 Kayla Terry - 78 y.o. female MRN [id number]  Date of birth: 29-May-1946  Office Visit Note: Visit Date: 11/02/2023 PCP: Tanda Prentice DEL, MD Referred by: Tanda Prentice DEL, MD  Subjective: Chief Complaint  Patient presents with   Lower Back - Pain   Right Leg - Pain, Numbness   Left Leg - Pain, Numbness   HPI: Kayla Terry is a 78 y.o. female who comes in today as a self referral for evaluation of chronic, worsening and severe bilateral lower back pain radiating down to both legs. Pain ongoing for over 1 year. Also reports numbness/tingling to bilateral lower extremities. Patient has been evaluated by a number of providers over the years including Dr. Unice and Dr. Colon with [facility], Dr. Onetha Epp with [facility] and Dr. Oneil Herald. She is currently being treated at [facility], she was seen by Harlene Biles, NP this morning. Her pain worsens with walking and standing, states she has to lean over shopping cart at grocery store to alleviate her pain. She describes her pain as sore sensation, tingling to bilateral lower extremities, currently rates as 7 out of 10. She has tried home exercise regimen, rest and a number of medications such as Gabapentin , Cymbalta , Tylenol  and Ibuprofen  with minimal relief of pain. She does take Norco for severe pain. Lumbar MRI imaging from earlier this year shows at least moderate spinal canal stenosis at the level of L4-L5. Unfortunately, she suffered from CVA in 2023 and is no longer considered ideal surgical candidate. She did undergo L5-S1 interlaminar epidural steroid injection with Dr. Salka on 08/21/2023 that provided her with significant relief of pain, about 90% initially, sustained at 50% until the last several weeks. She has pending bilateral lower extremity NCV with EMG ([facility]). Patient denies recent trauma or falls. No bowel or bladder incontinence.      Review of Systems  Musculoskeletal:   Positive for back pain.  Neurological:  Positive for tingling. Negative for focal weakness and weakness.  All other systems reviewed and are negative.  Otherwise per HPI.  Assessment & Plan: Visit Diagnoses:    ICD-10-CM   1. Chronic bilateral low back pain with bilateral sciatica  M54.42    M54.41    G89.29     2. Lumbar radiculopathy  M54.16     3. Spinal stenosis of lumbar region with neurogenic claudication  M48.062        Plan: Findings:  Chronic, worsening and severe bilateral lower back pain radiating down both legs. Patient continues to have severe pain despite good conservative therapies such as home exercise regimen, rest and use of medications. Patients clinical presentation and exam are complex, I do feel her symptoms are consistent with neurogenic claudication as a result of spinal canal stenosis. There is moderate spinal canal stenosis at L4-L5. I also feel there could be an underlying chronic pain condition working to exacerbate her pain. Significant relief with prior interlaminar epidural steroid injection in October. I discussed treatment plan with patient today. We feel it would be best for patient to continue her care with [facility], she was seen in their office this morning and has pending bilateral lower extremity nerve study. Patient voiced she would like to try injection again if possible. I will ensure my note gets back to their practice. I encouraged patient to follow up with them and to continue current medication regimen. She has no questions at this time No red  flag symptoms noted upon exam today.     Meds & Orders: No orders of the defined types were placed in this encounter.  No orders of the defined types were placed in this encounter.   Follow-up: No follow-ups on file.   Procedures: No procedures performed      Clinical History: No specialty comments available.   She reports that she has never smoked. She has never used smokeless tobacco. No  results for input(s): HGBA1C, LABURIC in the last 8760 hours.  Objective:  VS:  HT:    WT:   BMI:     BP:(!) 152/87  HR:70bpm  TEMP: ( )  RESP:  Physical Exam Vitals and nursing note reviewed.  HENT:     Head: Normocephalic and atraumatic.     Right Ear: External ear normal.     Left Ear: External ear normal.     Nose: Nose normal.     Mouth/Throat:     Mouth: Mucous membranes are moist.  Eyes:     Extraocular Movements: Extraocular movements intact.  Cardiovascular:     Rate and Rhythm: Normal rate.     Pulses: Normal pulses.  Pulmonary:     Effort: Pulmonary effort is normal.  Abdominal:     General: Abdomen is flat. There is no distension.  Musculoskeletal:        General: Tenderness present.     Cervical back: Normal range of motion.     Comments: Patient is slow to rise from seated position to standing. Good lumbar range of motion. No pain noted with facet loading. 5/5 strength noted with bilateral hip flexion, knee flexion/extension, ankle dorsiflexion/plantarflexion and EHL. No clonus noted bilaterally. No pain upon palpation of greater trochanters. No pain with internal/external rotation of bilateral hips. Sensation intact bilaterally. Negative slump test bilaterally. Ambulates without aid, gait steady.     Skin:    General: Skin is warm and dry.     Capillary Refill: Capillary refill takes less than 2 seconds.  Neurological:     General: No focal deficit present.     Mental Status: She is alert and oriented to person, place, and time.  Psychiatric:        Mood and Affect: Mood normal.        Behavior: Behavior normal.     Ortho Exam  Imaging: No results found.  Past Medical/Family/Surgical/Social History: Medications & Allergies reviewed per EMR, new medications updated. 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/06/2022   COVID-19 virus infection 04/23/2021   Coronary artery disease 04/23/2021   Chest pain 04/22/2021   Chest pain of uncertain etiology    Nonspecific abnormal electrocardiogram (ECG) (EKG)    2nd degree AV block 03/25/2021   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   B12 deficiency 05/07/2020   Muscle weakness 05/06/2020   Benign essential hypertension 06/08/2017   Acid reflux disease 03/08/2016   Hyperlipidemia 03/08/2016   Nocturia 03/08/2016   Restless leg syndrome 03/08/2016   Chest tightness 08/21/2012   Past Medical History:  Diagnosis Date   Allergy    B12 deficiency 04/2020   Cataract    Coronary artery disease  COVID-19 04/22/2021   Diverticulitis    10/19/2022   Fatty liver    GERD (gastroesophageal reflux disease)    Herpes zoster without complications    Hiatal hernia    Hypertension    Mild mitral regurgitation 2013   Mobitz type 2 second degree atrioventricular block    Pectus excavatum    pectus excavatum deformity resulting in mass effect on the RV by CT 03/2020   Pulmonary nodule    Shingles    Stroke [facility])    10/19/2022   Family History  Problem Relation Age of Onset   Cancer Mother    Leukemia Mother    Lung cancer Father    Multiple myeloma Father    Multiple sclerosis Sister    Alcohol  abuse Brother    Multiple myeloma Brother    Colon cancer Neg Hx    Colon polyps Neg Hx    Stomach cancer Neg Hx    Esophageal cancer Neg Hx    Neuropathy Neg Hx    Stroke Neg Hx    Past Surgical History:  Procedure Laterality Date   ABDOMINAL HYSTERECTOMY  02/1984   BACK SURGERY     CATARACT EXTRACTION, BILATERAL     COLONOSCOPY WITH PROPOFOL  N/A 01/17/2023   Procedure: COLONOSCOPY WITH PROPOFOL ;  Surgeon: Leigh Elspeth SQUIBB, MD;   Location: [facility];  Service: Gastroenterology;  Laterality: N/A;   LEFT HEART CATH AND CORONARY ANGIOGRAPHY N/A 04/22/2021   Procedure: LEFT HEART CATH AND CORONARY ANGIOGRAPHY;  Surgeon: Verlin Lonni BIRCH, MD;  [facility];  Service: Cardiovascular;  Laterality: N/A;   LUMBAR LAMINECTOMY     OOPHORECTOMY  02/29/1984   unilateral   POLYPECTOMY  01/17/2023   Procedure: POLYPECTOMY;  Surgeon: Leigh Elspeth SQUIBB, MD;  Location: [facility];  Service: Gastroenterology;;   Social History   Occupational History   Not on file  Tobacco Use   Smoking status: Never   Smokeless tobacco: Never  Vaping Use   Vaping status: Never Used  Substance and Sexual Activity   Alcohol  use: No   Drug use: No   Sexual activity: Not on file

## 2023-11-24 ENCOUNTER — Ambulatory Visit: Payer: Medicare Other | Admitting: Orthopaedic Surgery

## 2023-11-24 VITALS — BP 137/90 | HR 78 | Ht 63.0 in | Wt 145.0 lb

## 2023-11-24 DIAGNOSIS — G2581 Restless legs syndrome: Secondary | ICD-10-CM | POA: Diagnosis not present

## 2023-11-24 DIAGNOSIS — M48062 Spinal stenosis, lumbar region with neurogenic claudication: Secondary | ICD-10-CM | POA: Diagnosis not present

## 2023-11-24 DIAGNOSIS — M5000 Cervical disc disorder with myelopathy, unspecified cervical region: Secondary | ICD-10-CM | POA: Diagnosis not present

## 2023-11-24 NOTE — Progress Notes (Signed)
Office Visit Note   Patient: Kayla Terry           Date of Birth: [birth year]           MRN: [id number] Visit Date: 11/24/2023              Requested by: [name], MD [address] [place],  [place] [address] PCP: [name], MD   Assessment & Plan: Visit Diagnoses:  1. Spinal stenosis of lumbar region with neurogenic claudication   2. Restless leg syndrome   3. Cervical disc disorder with myelopathy     Plan: Will have her return in a week and see if we can find results of her nerve conduction and electrical EMG tests to review.  Follow-Up Instructions: Return in about 1 week (around 12/01/2023).   Orders:  No orders of the defined types were placed in this encounter.  No orders of the defined types were placed in this encounter.     Procedures: No procedures performed   Clinical Data: No additional findings.   Subjective: Chief Complaint  Patient presents with   Lower Back - Follow-up    Nerve conduction 11/13/22    HPI 78 year old female with previous CVA 09/2022 with some residual right-sided weakness has had back pain and bilateral numbness in her feet.  She has to use a grocery cart when she goes to the store.  She states she leans on the cart which allows her to walk better.  She gets some relief with sitting.  Previous MRI scan showed severe stenosis at L4-5 with grade 1 anterolisthesis.  Patient's also had problems with chronic neck pain and does have moderate to severe stenosis at C6-7 and C5-6 biforaminal stenosis and moderate central stenosis.  She had an epidural in the fall which gave her good relief and she still thinks some of it is active with helping.  Recent electrical test done at [facility] 11/14/2023.  [name].  Discussed possible surgery with her but with the recent stroke he had recommended nonsurgical treatment.  Review of Systems updated unchanged from 08/15/2023 noted other than as mentioned above.   Objective: Vital Signs:  BP (!) 137/90 (BP Location: Right Arm, Patient Position: Sitting)   Pulse 78   Ht 5\' 3"  (1.6 m)   Wt 145 lb (65.8 kg)   BMI 25.69 kg/m   Physical Exam Constitutional:      Appearance: She is well-developed.  HENT:     Head: Normocephalic.     Right Ear: External ear normal.     Left Ear: External ear normal. There is no impacted cerumen.  Eyes:     Pupils: Pupils are equal, round, and reactive to light.  Neck:     Thyroid: No thyromegaly.     Trachea: No tracheal deviation.  Cardiovascular:     Rate and Rhythm: Normal rate.  Pulmonary:     Effort: Pulmonary effort is normal.  Abdominal:     Palpations: Abdomen is soft.  Musculoskeletal:     Cervical back: No rigidity.  Skin:    General: Skin is warm and dry.  Neurological:     Mental Status: She is alert and oriented to person, place, and time.  Psychiatric:        Behavior: Behavior normal.     Ortho Exam patient with some mild brachioplexus tenderness right and left show short stride gait.  Some right-sided weakness grip biceps triceps on the right and quads on  the right versus left.  No rash over exposed skin.  Decreased sensation both feet  Specialty Comments:  No specialty comments available.  Imaging: No results found.   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/06/2022   COVID-19 virus infection 04/23/2021   Coronary artery disease 04/23/2021   Chest pain 04/22/2021   Chest pain of uncertain etiology    Nonspecific abnormal electrocardiogram (ECG) (EKG)    2nd degree AV block 03/25/2021   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   B12  deficiency 05/07/2020   Muscle weakness 05/06/2020   Benign essential hypertension 06/08/2017   Acid reflux disease 03/08/2016   Hyperlipidemia 03/08/2016   Nocturia 03/08/2016   Restless leg syndrome 03/08/2016   Chest tightness 08/21/2012   Past Medical History:  Diagnosis Date   Allergy    B12 deficiency 04/2020   Cataract    Coronary artery disease    COVID-19 04/22/2021   Diverticulitis    10/19/2022   Fatty liver    GERD (gastroesophageal reflux disease)    Herpes zoster without complications    Hiatal hernia    Hypertension    Mild mitral regurgitation 2013   Mobitz type 2 second degree atrioventricular block    Pectus excavatum    pectus excavatum deformity resulting in mass effect on the RV by CT 03/2020   Pulmonary nodule    Shingles    Stroke [facility])    10/19/2022    Family History  Problem Relation Age of Onset   Cancer Mother    Leukemia Mother    Lung cancer Father    Multiple myeloma Father    Multiple sclerosis Sister    Alcohol abuse Brother    Multiple myeloma Brother    Colon cancer Neg Hx    Colon polyps Neg Hx    Stomach cancer Neg Hx    Esophageal cancer Neg Hx    Neuropathy Neg Hx    Stroke Neg Hx     Past Surgical History:  Procedure Laterality Date   ABDOMINAL HYSTERECTOMY  02/1984   BACK SURGERY     CATARACT EXTRACTION, BILATERAL     COLONOSCOPY WITH PROPOFOL N/A 01/17/2023   Procedure: COLONOSCOPY WITH PROPOFOL;  Surgeon: Benancio Deeds, MD;  [facility];  Service: Gastroenterology;  Laterality: N/A;   LEFT HEART CATH AND CORONARY ANGIOGRAPHY N/A 04/22/2021   Procedure: LEFT HEART CATH AND CORONARY ANGIOGRAPHY;  Surgeon: Kathleene Hazel, MD;  [facility];  Service: Cardiovascular;  Laterality: N/A;   LUMBAR LAMINECTOMY     OOPHORECTOMY  02/29/1984   unilateral   POLYPECTOMY  01/17/2023   Procedure: POLYPECTOMY;  Surgeon: Benancio Deeds, MD;  [facility];  Service:  Gastroenterology;;   Social History   Occupational History   Not on file  Tobacco Use   Smoking status: Never   Smokeless tobacco: Never  Vaping Use   Vaping status: Never Used  Substance and Sexual Activity   Alcohol use: No   Drug use: No   Sexual activity: Not on file

## 2023-11-27 ENCOUNTER — Emergency Department (HOSPITAL_COMMUNITY): Payer: Medicare Other

## 2023-11-27 ENCOUNTER — Emergency Department (HOSPITAL_COMMUNITY)
Admission: EM | Admit: 2023-11-27 | Discharge: 2023-11-27 | Disposition: A | Discharge status: Home / Self Care | Payer: Medicare Other | Attending: Emergency Medicine | Admitting: Emergency Medicine

## 2023-11-27 ENCOUNTER — Other Ambulatory Visit: Payer: Self-pay

## 2023-11-27 ENCOUNTER — Encounter (HOSPITAL_COMMUNITY): Payer: Self-pay

## 2023-11-27 DIAGNOSIS — M549 Dorsalgia, unspecified: Secondary | ICD-10-CM | POA: Insufficient documentation

## 2023-11-27 DIAGNOSIS — G8929 Other chronic pain: Secondary | ICD-10-CM | POA: Diagnosis not present

## 2023-11-27 DIAGNOSIS — Z8673 Personal history of transient ischemic attack (TIA), and cerebral infarction without residual deficits: Secondary | ICD-10-CM | POA: Insufficient documentation

## 2023-11-27 DIAGNOSIS — Z79899 Other long term (current) drug therapy: Secondary | ICD-10-CM | POA: Insufficient documentation

## 2023-11-27 DIAGNOSIS — I10 Essential (primary) hypertension: Secondary | ICD-10-CM | POA: Diagnosis present

## 2023-11-27 DIAGNOSIS — R1030 Lower abdominal pain, unspecified: Secondary | ICD-10-CM | POA: Diagnosis not present

## 2023-11-27 DIAGNOSIS — I2510 Atherosclerotic heart disease of native coronary artery without angina pectoris: Secondary | ICD-10-CM | POA: Diagnosis not present

## 2023-11-27 DIAGNOSIS — Z7982 Long term (current) use of aspirin: Secondary | ICD-10-CM | POA: Insufficient documentation

## 2023-11-27 LAB — URINALYSIS, ROUTINE W REFLEX MICROSCOPIC
Bilirubin Urine: NEGATIVE
Glucose, UA: NEGATIVE mg/dL
Hgb urine dipstick: NEGATIVE
Ketones, ur: NEGATIVE mg/dL
Leukocytes,Ua: NEGATIVE
Nitrite: NEGATIVE
Protein, ur: NEGATIVE mg/dL
Specific Gravity, Urine: 1.001 — ABNORMAL LOW (ref 1.005–1.030)
pH: 6 (ref 5.0–8.0)

## 2023-11-27 LAB — COMPREHENSIVE METABOLIC PANEL
ALT: 12 U/L (ref 0–44)
AST: 22 U/L (ref 15–41)
Albumin: 4 g/dL (ref 3.5–5.0)
Alkaline Phosphatase: 79 U/L (ref 38–126)
Anion gap: 11 (ref 5–15)
BUN: 5 mg/dL — ABNORMAL LOW (ref 8–23)
CO2: 23 mmol/L (ref 22–32)
Calcium: 9.5 mg/dL (ref 8.9–10.3)
Chloride: 102 mmol/L (ref 98–111)
Creatinine, Ser: 0.85 mg/dL (ref 0.44–1.00)
GFR, Estimated: >60 mL/min (ref >60)
Glucose, Bld: 132 mg/dL — ABNORMAL HIGH (ref 70–99)
Potassium: 3.6 mmol/L (ref 3.5–5.1)
Sodium: 136 mmol/L (ref 135–145)
Total Bilirubin: 0.5 mg/dL (ref 0.0–1.2)
Total Protein: 7.5 g/dL (ref 6.5–8.1)

## 2023-11-27 LAB — CBC WITH DIFFERENTIAL/PLATELET
Abs Immature Granulocytes: 0.01 10*3/uL (ref 0.00–0.07)
Basophils Absolute: 0 10*3/uL (ref 0.0–0.1)
Basophils Relative: 1 %
Eosinophils Absolute: 0 10*3/uL (ref 0.0–0.5)
Eosinophils Relative: 1 %
HCT: 39.5 % (ref 36.0–46.0)
Hemoglobin: 13.2 g/dL (ref 12.0–15.0)
Immature Granulocytes: 0 %
Lymphocytes Relative: 14 %
Lymphs Abs: 0.8 10*3/uL (ref 0.7–4.0)
MCH: 29.9 pg (ref 26.0–34.0)
MCHC: 33.4 g/dL (ref 30.0–36.0)
MCV: 89.4 fL (ref 80.0–100.0)
Monocytes Absolute: 0.5 10*3/uL (ref 0.1–1.0)
Monocytes Relative: 8 %
Neutro Abs: 4.7 10*3/uL (ref 1.7–7.7)
Neutrophils Relative %: 76 %
Platelets: 235 10*3/uL (ref 150–400)
RBC: 4.42 MIL/uL (ref 3.87–5.11)
RDW: 13.2 % (ref 11.5–15.5)
WBC: 6.1 10*3/uL (ref 4.0–10.5)
nRBC: 0 % (ref 0.0–0.2)

## 2023-11-27 MED ORDER — IOHEXOL 300 MG/ML  SOLN
80.0000 mL | Freq: Once | INTRAMUSCULAR | Status: AC | PRN
  Administered 2023-11-27: 80 mL via INTRAVENOUS

## 2023-11-27 NOTE — ED Provider Notes (Signed)
EMERGENCY DEPARTMENT AT [facility] Provider Note   CSN: [id number] Arrival date & time: 11/27/23  4098     History  Chief Complaint  Patient presents with   Hypertension    Kayla Terry is a 78 y.o. female.   Hypertension  Patient presents with lower abdominal pain.  Has had for months now.  States worse however for the last couple weeks.  States much more severe.  States in lower abdomen.  No dysuria.  No fevers or chills.  No constipation.  Does have chronic back pain that is also been bothering her.  States blood pressure was elevated at home.  No chest pain.  No trouble breathing.    Past Medical History:  Diagnosis Date   Allergy    B12 deficiency 04/2020   Cataract    Coronary artery disease    COVID-19 04/22/2021   Diverticulitis    10/19/2022   Fatty liver    GERD (gastroesophageal reflux disease)    Herpes zoster without complications    Hiatal hernia    Hypertension    Mild mitral regurgitation 2013   Mobitz type 2 second degree atrioventricular block    Pectus excavatum    pectus excavatum deformity resulting in mass effect on the RV by CT 03/2020   Pulmonary nodule    Shingles    Stroke (HCC)    10/19/2022    Home Medications Prior to Admission medications   Medication Sig Start Date End Date Taking? Authorizing Provider  amLODipine (NORVASC) 5 MG tablet Take 5 mg by mouth daily as needed (High BP).    [provider]  aspirin EC 81 MG tablet Take 1 tablet (81 mg total) by mouth daily. Swallow whole. 10/22/22   Johnson, Clanford L, MD  cyanocobalamin (VITAMIN B12) 1000 MCG tablet Take 1,000 mcg by mouth daily.    [provider]  famotidine (PEPCID) 20 MG tablet Take 20 mg by mouth daily as needed for heartburn or indigestion.    [provider]  gabapentin (NEURONTIN) 300 MG capsule Take 1-2 capsules (300-600 mg total) by mouth 3 (three) times daily. 06/14/23   Anson Fret, MD  losartan  (COZAAR) 50 MG tablet Take 1 tablet (50 mg total) by mouth daily. Patient taking differently: Take 50 mg by mouth daily as needed (High BP). 10/22/22   Johnson, Clanford L, MD  Peppermint Oil (IBGARD) 90 MG CPCR Take 90 mg by mouth daily as needed (IBS).    [provider]  Propylene Glycol (SYSTANE BALANCE) 0.6 % SOLN Place 1 drop into both eyes daily as needed (dry eye).    [provider]  traZODone (DESYREL) 100 MG tablet Take 100 mg by mouth at bedtime. 03/02/22   [provider]  zolpidem (AMBIEN) 10 MG tablet Take 10 mg by mouth at bedtime.    [provider]      Allergies    Lexapro [escitalopram], Protonix [pantoprazole], and Naproxen sodium    Review of Systems   Review of Systems  Physical Exam Updated Vital Signs BP (!) 155/145   Pulse 87   Temp 98.2 F (36.8 C) (Oral)   Resp 18   Ht 5\' 3"  (1.6 m)   Wt 65.7 kg   SpO2 93%   BMI 25.66 kg/m  Physical Exam Vitals and nursing note reviewed.  Abdominal:     Comments: Mild lower abdominal tenderness without rebound or guarding.  No hernia palpated.  Musculoskeletal:  Cervical back: Neck supple.  Skin:    General: Skin is warm.     Capillary Refill: Capillary refill takes less than 2 seconds.  Neurological:     Mental Status: She is alert.     ED Results / Procedures / Treatments   Labs (all labs ordered are listed, but only abnormal results are displayed) Labs Reviewed  URINALYSIS, ROUTINE W REFLEX MICROSCOPIC - Abnormal; Notable for the following components:      Result Value   Color, Urine COLORLESS (*)    Specific Gravity, Urine 1.001 (*)    All other components within normal limits  COMPREHENSIVE METABOLIC PANEL - Abnormal; Notable for the following components:   Glucose, Bld 132 (*)    BUN 5 (*)    All other components within normal limits  CBC WITH DIFFERENTIAL/PLATELET    EKG None  Radiology CT ABDOMEN PELVIS W CONTRAST Result Date: 11/27/2023 CLINICAL  DATA:  Left lower quadrant pain EXAM: CT ABDOMEN AND PELVIS WITH CONTRAST TECHNIQUE: Multidetector CT imaging of the abdomen and pelvis was performed using the standard protocol following bolus administration of intravenous contrast. RADIATION DOSE REDUCTION: This exam was performed according to the departmental dose-optimization program which includes automated exposure control, adjustment of the mA and/or kV according to patient size and/or use of iterative reconstruction technique. CONTRAST:  80mL OMNIPAQUE IOHEXOL 300 MG/ML  SOLN COMPARISON:  11/08/2022 FINDINGS: Lower chest: Scattered ground-glass densities dependently in the lower lobes, likely atelectasis. No effusions. Hepatobiliary: No focal hepatic abnormality. Gallbladder unremarkable. Pancreas: No focal abnormality or ductal dilatation. Spleen: No focal abnormality.  Normal size. Adrenals/Urinary Tract: No adrenal abnormality. No focal renal abnormality. No stones or hydronephrosis. Urinary bladder is unremarkable. Stomach/Bowel: Scattered sigmoid diverticula. No active diverticulitis. Stomach and small bowel decompressed, unremarkable. No obstruction or inflammatory process. Vascular/Lymphatic: Aortic atherosclerosis. No evidence of aneurysm or adenopathy. Reproductive: Prior hysterectomy.  No adnexal masses. Other: No free fluid or free air. Musculoskeletal: No acute bony abnormality. IMPRESSION: No acute findings in the abdomen or pelvis. Sigmoid diverticulosis.  No active diverticulitis. Aortic atherosclerosis. Electronically Signed   By: Charlett Nose M.D.   On: 11/27/2023 12:24    Procedures Procedures    Medications Ordered in ED Medications  iohexol (OMNIPAQUE) 300 MG/ML solution 80 mL (80 mLs Intravenous Contrast Given 11/27/23 1157)    ED Course/ Medical Decision Making/ A&P                                 Medical Decision Making Amount and/or Complexity of Data Reviewed Labs: ordered. Radiology: ordered.  Risk Prescription  drug management.   Patient with acute on chronic abdominal pain.  Reviewing notes has had both negative CT scans and CT scan showing diverticulitis.  With some tenderness and worsening symptoms will get CT scan to evaluate.  Will get basic blood work.  Also has chronic back pain.  Blood pressure is elevated could be secondary to pain or her chronic hypertension.  Doubt endorgan damage.  Do not think we will need acute treatment of this.  Reviewed previous admission to the hospital for abdominal pain and hypertension in September.  Workup reassuring.  Do not see endorgan damage.  Can follow-up with her doctor and continue her blood pressure medicines.  CT scan of the abdomen reassuring.  Discharged home.        Final Clinical Impression(s) / ED Diagnoses Final diagnoses:  Hypertension, unspecified type  Chronic  abdominal pain    Rx / DC Orders ED Discharge Orders     None         Benjiman Core, MD 11/27/23 1246

## 2023-11-27 NOTE — ED Triage Notes (Signed)
Pt arrived from home via POV c/o generalized lower abdominal pain, and hypertension. Pt reports her BP readings at home were 200's/100's. Pt reports her abdominal pain has been persistent since this past September.

## 2023-11-27 NOTE — Discharge Instructions (Signed)
Your workup was reassuring today.  Continue take your medicines and follow-up your doctors.

## 2023-11-28 ENCOUNTER — Telehealth: Payer: Self-pay | Admitting: Gastroenterology

## 2023-11-28 NOTE — Telephone Encounter (Signed)
Patient is returning your call.

## 2023-11-28 NOTE — Telephone Encounter (Signed)
Patient called requesting to speak with a nurse regarding ED follow for abdominal pain.

## 2023-11-28 NOTE — Telephone Encounter (Signed)
Dr. Chales Abrahams as DOD PM of 11/28/23 please advise.  Dr. Lanetta Inch patient with a hx of diverticulitis. Pt was seen in ED yesterday, CT scan negative (results are in epic). Pt reports that she has pain from her lower abdomen all of the way to her lower back. Pt reports "tingling" in her feet, she has been taking Gabapentin which "takes the edge off". Pt denies any injury or heavy lifting. Pt states that she recently had a nerve conduction test and has an appt to f/u with ortho later this week. Pt is concerned about abdominal pain, IB Delene Ruffini is not effective. Pt reports regular bowel movements daily, no pain with urination. Pt describes the pain as sore. I reassured patient that CT was negative for diverticulitis. Pt is seeking advice until her f/u appt with Dr. Adela Lank on 12/04/23. Thanks

## 2023-11-28 NOTE — Telephone Encounter (Signed)
DOD (patient Dr. Adela Lank)  I have reviewed colonoscopy report from 12/2022 showing significant sigmoid narrowing, multiple diverticula I have reviewed CT scan Abdo/pelvis from yesterday: No acute diverticulitis.  But she has significant stool in the right colon and a very torturous transverse colon with stool. She had normal CBC, CMP  Plan: -MiraLAX 17 g p.o. twice daily until a large bowel movement, then once a day -Unfortunately, her pain may get little worse before it starts getting better. -Trial of Bentyl 10 mg p.o. twice daily PRN x 2 weeks  #30 (for colonic spasms/Abdo pain) -Please let us know how she is in 2 weeks -She has follow-up appointment with Dr. Adela Lank in coming days.  She needs to keep that appointment.  RG

## 2023-11-28 NOTE — Telephone Encounter (Signed)
Lm on vm for patient to return call

## 2023-11-29 NOTE — Telephone Encounter (Signed)
Patient called requesting to speak with you again about her abdominal pain and what she can take to help her prior to her appt.

## 2023-11-29 NOTE — Telephone Encounter (Signed)
Left message on machine to call back

## 2023-11-30 NOTE — Telephone Encounter (Signed)
PT is returning call to follow up on doctor's recommendations. Please advise.

## 2023-12-01 ENCOUNTER — Ambulatory Visit (INDEPENDENT_AMBULATORY_CARE_PROVIDER_SITE_OTHER): Payer: Medicare Other | Admitting: Orthopaedic Surgery

## 2023-12-01 ENCOUNTER — Encounter: Payer: Self-pay | Admitting: Orthopaedic Surgery

## 2023-12-01 ENCOUNTER — Other Ambulatory Visit (INDEPENDENT_AMBULATORY_CARE_PROVIDER_SITE_OTHER): Payer: Self-pay

## 2023-12-01 VITALS — BP 142/91 | HR 73 | Ht 63.0 in | Wt 144.0 lb

## 2023-12-01 DIAGNOSIS — M48062 Spinal stenosis, lumbar region with neurogenic claudication: Secondary | ICD-10-CM

## 2023-12-01 NOTE — Telephone Encounter (Signed)
Inbound call from patient returning phone call requesting a call back. Please advise, thank you.

## 2023-12-01 NOTE — Telephone Encounter (Signed)
 Left message on machine to call back

## 2023-12-01 NOTE — Progress Notes (Unsigned)
Office Visit Note   Patient: Kayla Terry           Date of Birth: September 29, 1946           MRN: [id number] Visit Date: 12/01/2023              Requested by: [name], MD [address] [place],  [place] [address] PCP: Pcp, No   Assessment & Plan: Visit Diagnoses: No diagnosis found.  Plan: ***  Follow-Up Instructions: No follow-ups on file.   Orders:  No orders of the defined types were placed in this encounter.  No orders of the defined types were placed in this encounter.     Procedures: No procedures performed   Clinical Data: No additional findings.   Subjective: Chief Complaint  Patient presents with   EMG/NCV review    HPI  Review of Systems   Objective: Vital Signs: BP (!) 142/91   Pulse 73   Ht 5\' 3"  (1.6 m)   Wt 144 lb (65.3 kg)   BMI 25.51 kg/m   Physical Exam  Ortho Exam  Specialty Comments:  No specialty comments available.  Imaging: No results found.   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   COVID-19 virus infection 04/23/2021   Coronary artery disease 04/23/2021   Chest pain 04/22/2021   Chest pain of uncertain etiology    Nonspecific abnormal electrocardiogram (ECG) (EKG)    2nd degree AV block 03/25/2021   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   B12 deficiency 05/07/2020   Muscle weakness 05/06/2020   Benign essential hypertension 06/08/2017   Acid reflux disease 03/08/2016   Hyperlipidemia 03/08/2016   Nocturia 03/08/2016   Restless leg syndrome 03/08/2016   Chest tightness 08/21/2012   Past Medical History:  Diagnosis Date   Allergy     B12 deficiency 04/2020   Cataract    Coronary artery disease    COVID-19 04/22/2021   Diverticulitis    10/19/2022   Fatty liver    GERD (gastroesophageal reflux disease)    Herpes zoster without complications    Hiatal hernia    Hypertension    Mild mitral regurgitation 2013   Mobitz type 2 second degree atrioventricular block    Pectus excavatum    pectus excavatum deformity resulting in mass effect on the RV by CT 03/2020   Pulmonary nodule    Shingles    Stroke [facility])    10/19/2022    Family History  Problem Relation Age of Onset   Cancer Mother    Leukemia Mother    Lung cancer Father    Multiple myeloma Father    Multiple sclerosis Sister    Alcohol abuse Brother    Multiple myeloma Brother    Colon cancer Neg Hx    Colon polyps Neg Hx    Stomach cancer Neg Hx    Esophageal cancer Neg Hx    Neuropathy Neg Hx    Stroke Neg Hx     Past Surgical History:  Procedure Laterality Date   ABDOMINAL HYSTERECTOMY  02/1984   BACK SURGERY     CATARACT EXTRACTION, BILATERAL  COLONOSCOPY WITH PROPOFOL N/A 01/17/2023   Procedure: COLONOSCOPY WITH PROPOFOL;  Surgeon: Benancio Deeds, MD;  Location: [facility]OS[facility];  Service: Gastroenterology;  Laterality: N/A;   LEFT HEART CATH AND CORONARY ANGIOGRAPHY N/A 04/22/2021   Procedure: LEFT HEART CATH AND CORONARY ANGIOGRAPHY;  Surgeon: Kathleene Hazel, MD;  Location: [facility]VASIVE [facility];  Service: Cardiovascular;  Laterality: N/A;   LUMBAR LAMINECTOMY     OOPHORECTOMY  02/29/1984   unilateral   POLYPECTOMY  01/17/2023   Procedure: POLYPECTOMY;  Surgeon: Benancio Deeds, MD;  Location: [facility] ENDOSCOPY;  Service: Gastroenterology;;   Social History   Occupational History   Not on file  Tobacco Use   Smoking status: Never    Passive exposure: Never   Smokeless tobacco: Never  Vaping Use   Vaping status: Never Used  Substance and Sexual Activity   Alcohol use: No   Drug use: No   Sexual activity: Not Currently

## 2023-12-04 ENCOUNTER — Ambulatory Visit: Payer: Medicare Other | Admitting: Gastroenterology

## 2023-12-04 ENCOUNTER — Encounter: Payer: Self-pay | Admitting: Gastroenterology

## 2023-12-04 VITALS — BP 128/78 | HR 80 | Ht 63.0 in | Wt 148.4 lb

## 2023-12-04 DIAGNOSIS — G894 Chronic pain syndrome: Secondary | ICD-10-CM | POA: Diagnosis not present

## 2023-12-04 DIAGNOSIS — M545 Low back pain, unspecified: Secondary | ICD-10-CM | POA: Diagnosis not present

## 2023-12-04 DIAGNOSIS — R1030 Lower abdominal pain, unspecified: Secondary | ICD-10-CM | POA: Diagnosis not present

## 2023-12-04 MED ORDER — CITRUCEL PO POWD: ORAL | Status: DC

## 2023-12-04 MED ORDER — DICYCLOMINE HCL 10 MG PO CAPS: 10.0000 mg | ORAL_CAPSULE | Freq: Three times a day (TID) | ORAL | 1 refills | Status: DC | PRN
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
## 2023-12-04 NOTE — Telephone Encounter (Signed)
Got it, thanks for letting me know

## 2023-12-04 NOTE — Telephone Encounter (Signed)
Dr. Adela Lank, please see message stream below while I was out of the office. Nursing staff was not able to relay DOD recommendations as outlined below. Patient has an appt with you at 2:30 pm. Thanks

## 2023-12-04 NOTE — Patient Instructions (Addendum)
Please purchase the following medications over the counter and take as directed: Citrucel - take once daily as directed  We have sent the following medications to your pharmacy for you to pick up at your convenience: Bentyl: Take every 8 hours as needed  Please follow up with pain management regarding back pain.  Thank you for entrusting me with your care and for choosing [facility], Dr. Ileene Patrick    If your blood pressure at your visit was 140/90 or greater, please contact your primary care physician to follow up on this. ______________________________________________________  If you are age 65 or older, your body mass index should be between 23-30. Your Body mass index is 26.28 kg/m. If this is out of the aforementioned range listed, please consider follow up with your Primary Care Provider.  If you are age 57 or younger, your body mass index should be between 19-25. Your Body mass index is 26.28 kg/m. If this is out of the aformentioned range listed, please consider follow up with your Primary Care Provider.  ________________________________________________________  The [facility] providers would like to encourage you to use [facility] to communicate with providers for non-urgent requests or questions.  Due to long hold times on the telephone, sending your provider a message by [facility] may be a faster and more efficient way to get a response.  Please allow 48 business hours for a response.  Please remember that this is for non-urgent requests.  _______________________________________________________  Due to recent changes in healthcare laws, you may see the results of your imaging and laboratory studies on MyChart before your provider has had a chance to review them.  We understand that in some cases there may be results that are confusing or concerning to you. Not all laboratory results come back in the same time frame and the provider may be waiting for multiple results in  order to interpret others.  Please give [place] 48 hours in order for your provider to thoroughly review all the results before contacting the office for clarification of your results.

## 2023-12-05 ENCOUNTER — Telehealth: Payer: Self-pay

## 2023-12-05 NOTE — Telephone Encounter (Signed)
 Left voicemail for patient to return call to office.

## 2023-12-05 NOTE — Telephone Encounter (Signed)
 Copied from CRM [id number]. Topic: General - Other >> Dec 05, 2023  8:47 AM Joanell B wrote: Reason for CRM: Pt stated that she was informed that she could see Dr.Duncan and would like to schedule and physical and would like for him to be her new PCP if possible.

## 2023-12-05 NOTE — Telephone Encounter (Signed)
I am accepting a very limited number of patients who are family/etc of current patients.  Please verify that and let me know.  Thanks.

## 2023-12-05 NOTE — Telephone Encounter (Signed)
Please advise. I was under the impression you were not taking any new patients

## 2023-12-08 NOTE — Telephone Encounter (Signed)
 Spoke with patient to advise Dr. Vallarie Gauze not accepting new patients

## 2023-12-27 ENCOUNTER — Ambulatory Visit: Payer: Medicare Other | Admitting: Orthopedic Surgery

## 2023-12-27 VITALS — BP 125/81 | HR 79 | Ht 63.0 in | Wt 144.0 lb

## 2023-12-27 DIAGNOSIS — G629 Polyneuropathy, unspecified: Secondary | ICD-10-CM | POA: Diagnosis not present
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
## 2024-02-06 ENCOUNTER — Encounter: Payer: Self-pay | Admitting: Physician Assistant

## 2024-02-06 ENCOUNTER — Other Ambulatory Visit (INDEPENDENT_AMBULATORY_CARE_PROVIDER_SITE_OTHER): Payer: Self-pay

## 2024-02-06 ENCOUNTER — Ambulatory Visit: Admitting: Physician Assistant

## 2024-02-06 DIAGNOSIS — M79672 Pain in left foot: Secondary | ICD-10-CM | POA: Diagnosis not present

## 2024-02-06 NOTE — Progress Notes (Addendum)
 Office Visit Note   Patient: Kayla Terry           Date of Birth: 07/20/1946           MRN: [id number] Visit Date: 02/06/2024              Requested by: No referring provider defined for this encounter. PCP: Pcp, No   Assessment & Plan: Visit Diagnoses:  1. Pain of left heel     Plan: 78 year old woman with a 4-day history of left medial heel pain and erythema.  She does have neuropathy she does not recall stepping on anything.  This started on Friday.  She was seen at [facility] yesterday at an urgent care.  They did incise the area where from what her description tells me there was a "small abscess.  She said they did not express anything but blood.  Placed her on doxycycline she has not had any fever or chills.  She does have both posterior tibial and dorsalis pedis pulses that are easily palpable.  I cannot appreciate any foreign bodies on her x-ray.  She has only had a couple doses of doxycycline.  She does not have any fever or chills.  Will place her in a PRAFO boot.  Also will delineate the area of erythema so she can tell easily if it is getting worse.  Will recheck her on Friday.  Should be trying to do cleansing with soap and water twice daily Dial soap preferably.  She has been giving strict follow-up precautions Unfortunately the patient left and we could not find her she said she was worried that she made her drive or wait too long.  She left prior to boot she has not yet made an appointment for follow-up.  We will try and and contact her to at least give her a follow-up appointment for later this and inform her that if things get worse she could also go to the hospital Follow-Up Instructions: 4 days  Orders:  Orders Placed This Encounter  Procedures   XR Foot Complete Left   DG Os Calcis Left   No orders of the defined types were placed in this encounter.     Procedures: No procedures performed   Clinical Data: No additional findings.   Subjective: No  chief complaint on file.   HPI patient is a 78 year old woman with a 4-day history of left heel pain.  She denies any injuries.  She says it sore to touch and warm.  She did go to urgent care at [facility] yesterday who did from what I can gather an incision and drainage.  She is currently on doxycycline denies any fever or chills she had been treating this with warm water soaks.  Denies any fever or chills  Review of Systems  All other systems reviewed and are negative.    Objective: Vital Signs: There were no vitals taken for this visit.  Physical Exam Constitutional:      Appearance: Normal appearance.  Pulmonary:     Effort: Pulmonary effort is normal.     Breath sounds: Normal breath sounds.  Skin:    General: Skin is warm and dry.  Neurological:     Mental Status: She is alert.  Psychiatric:        Mood and Affect: Mood normal.        Behavior: Behavior normal.     Ortho Exam Examination of her left medial she has a dorsalis pedis pulse palpable.  She on the medial side of her heel she has a small puncture wound not sure if this is secondary to the I&D that was performed in the office yesterday.  But she does have some surrounding erythema she is extremely tender to touch compartments are soft and nontender she otherwise appears well.  She has a palpable dorsalis pedis and posterior tibial tendon pulse Specialty Comments:  No specialty comments available.  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/06/2022   COVID-19 virus infection 04/23/2021   Coronary artery disease 04/23/2021   Chest pain 04/22/2021   Chest pain of uncertain etiology    Nonspecific abnormal electrocardiogram (ECG) (EKG)    2nd degree AV block 03/25/2021   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   B12 deficiency 05/07/2020   Muscle weakness 05/06/2020   Benign essential hypertension 06/08/2017   Acid reflux disease 03/08/2016   Hyperlipidemia 03/08/2016   Nocturia 03/08/2016   Restless leg syndrome 03/08/2016   Chest tightness 08/21/2012   Past Medical History:  Diagnosis Date   Allergy    B12 deficiency 04/2020   Cataract    Coronary artery disease    COVID-19 04/22/2021   Diverticulitis    10/19/2022   Fatty liver    GERD (gastroesophageal reflux disease)    Herpes zoster without complications    Hiatal hernia    Hypertension    Mild mitral regurgitation 2013   Mobitz type 2 second degree atrioventricular block    Pectus excavatum    pectus excavatum deformity resulting in mass effect on the RV by CT 03/2020   Pulmonary nodule    Shingles    Stroke [facility])    10/19/2022    Family History  Problem Relation Age of Onset   Cancer Mother    Leukemia Mother    Lung cancer Father    Multiple myeloma Father    Multiple sclerosis Sister    Alcohol abuse Brother    Multiple myeloma Brother    Colon cancer Neg Hx    Colon polyps Neg Hx    Stomach cancer Neg Hx    Esophageal cancer Neg Hx    Neuropathy Neg Hx    Stroke Neg Hx     Past Surgical History:  Procedure Laterality Date   ABDOMINAL HYSTERECTOMY  02/1984   BACK SURGERY     CATARACT EXTRACTION, BILATERAL     COLONOSCOPY WITH PROPOFOL N/A 01/17/2023   Procedure: COLONOSCOPY WITH PROPOFOL;  Surgeon: Benancio Deeds, MD;  Location: [facility];  Service: Gastroenterology;  Laterality: N/A;   LEFT HEART CATH AND  CORONARY ANGIOGRAPHY N/A 04/22/2021   Procedure: LEFT HEART CATH AND CORONARY ANGIOGRAPHY;  Surgeon: Kathleene Hazel, MD;  Location: [facility];  Service: Cardiovascular;  Laterality: N/A;   LUMBAR LAMINECTOMY     OOPHORECTOMY  02/29/1984   unilateral   POLYPECTOMY  01/17/2023   Procedure: POLYPECTOMY;  Surgeon: Benancio Deeds, MD;  Location: [facility];  Service: Gastroenterology;;   Social History   Occupational History   Not on file  Tobacco Use   Smoking status: Never    Passive exposure: Never   Smokeless tobacco: Never  Vaping Use   Vaping status: Never Used  Substance and Sexual Activity   Alcohol use: No   Drug use: No   Sexual activity: Not Currently

## 2024-03-21 ENCOUNTER — Other Ambulatory Visit: Payer: Self-pay | Admitting: Nurse Practitioner

## 2024-03-21 DIAGNOSIS — M5416 Radiculopathy, lumbar region: Secondary | ICD-10-CM

## 2024-04-02 ENCOUNTER — Ambulatory Visit
Admission: RE | Admit: 2024-04-02 | Discharge: 2024-04-02 | Disposition: A | Discharge status: Home / Self Care | Source: Ambulatory Visit | Attending: Nurse Practitioner | Admitting: Nurse Practitioner

## 2024-04-02 DIAGNOSIS — M5416 Radiculopathy, lumbar region: Secondary | ICD-10-CM

## 2024-04-04 ENCOUNTER — Inpatient Hospital Stay (HOSPITAL_COMMUNITY)
Admission: EM | Admit: 2024-04-04 | Discharge: 2024-04-06 | DRG: 244 | Disposition: A | Discharge status: Home / Self Care | Attending: Cardiology | Admitting: Cardiology

## 2024-04-04 ENCOUNTER — Encounter (HOSPITAL_COMMUNITY)
Admission: EM | Disposition: A | Discharge status: Home / Self Care | Payer: Self-pay | Source: Home / Self Care | Attending: Cardiology

## 2024-04-04 ENCOUNTER — Other Ambulatory Visit: Payer: Self-pay

## 2024-04-04 ENCOUNTER — Ambulatory Visit
Admission: EM | Admit: 2024-04-04 | Discharge: 2024-04-04 | Disposition: A | Discharge status: Other Acute Inpatient Hospital | Attending: Nurse Practitioner | Admitting: Nurse Practitioner

## 2024-04-04 ENCOUNTER — Inpatient Hospital Stay (HOSPITAL_COMMUNITY)

## 2024-04-04 ENCOUNTER — Emergency Department (HOSPITAL_COMMUNITY)

## 2024-04-04 ENCOUNTER — Encounter: Payer: Self-pay | Admitting: Emergency Medicine

## 2024-04-04 DIAGNOSIS — Z886 Allergy status to analgesic agent status: Secondary | ICD-10-CM | POA: Diagnosis not present

## 2024-04-04 DIAGNOSIS — Z860100 Personal history of colon polyps, unspecified: Secondary | ICD-10-CM

## 2024-04-04 DIAGNOSIS — R001 Bradycardia, unspecified: Secondary | ICD-10-CM

## 2024-04-04 DIAGNOSIS — Z807 Family history of other malignant neoplasms of lymphoid, hematopoietic and related tissues: Secondary | ICD-10-CM

## 2024-04-04 DIAGNOSIS — R42 Dizziness and giddiness: Secondary | ICD-10-CM

## 2024-04-04 DIAGNOSIS — Z8673 Personal history of transient ischemic attack (TIA), and cerebral infarction without residual deficits: Secondary | ICD-10-CM

## 2024-04-04 DIAGNOSIS — I2510 Atherosclerotic heart disease of native coronary artery without angina pectoris: Secondary | ICD-10-CM | POA: Diagnosis present

## 2024-04-04 DIAGNOSIS — I441 Atrioventricular block, second degree: Secondary | ICD-10-CM | POA: Diagnosis not present

## 2024-04-04 DIAGNOSIS — I10 Essential (primary) hypertension: Secondary | ICD-10-CM | POA: Diagnosis present

## 2024-04-04 DIAGNOSIS — K760 Fatty (change of) liver, not elsewhere classified: Secondary | ICD-10-CM | POA: Diagnosis present

## 2024-04-04 DIAGNOSIS — I639 Cerebral infarction, unspecified: Secondary | ICD-10-CM | POA: Diagnosis not present

## 2024-04-04 DIAGNOSIS — R079 Chest pain, unspecified: Secondary | ICD-10-CM | POA: Diagnosis not present

## 2024-04-04 DIAGNOSIS — K219 Gastro-esophageal reflux disease without esophagitis: Secondary | ICD-10-CM | POA: Diagnosis present

## 2024-04-04 DIAGNOSIS — R9431 Abnormal electrocardiogram [ECG] [EKG]: Secondary | ICD-10-CM | POA: Diagnosis not present

## 2024-04-04 DIAGNOSIS — Z809 Family history of malignant neoplasm, unspecified: Secondary | ICD-10-CM | POA: Diagnosis not present

## 2024-04-04 DIAGNOSIS — Z888 Allergy status to other drugs, medicaments and biological substances status: Secondary | ICD-10-CM | POA: Diagnosis not present

## 2024-04-04 DIAGNOSIS — Q676 Pectus excavatum: Secondary | ICD-10-CM | POA: Diagnosis not present

## 2024-04-04 DIAGNOSIS — Z8616 Personal history of COVID-19: Secondary | ICD-10-CM

## 2024-04-04 DIAGNOSIS — Z820 Family history of epilepsy and other diseases of the nervous system: Secondary | ICD-10-CM | POA: Diagnosis not present

## 2024-04-04 DIAGNOSIS — Z7982 Long term (current) use of aspirin: Secondary | ICD-10-CM | POA: Diagnosis not present

## 2024-04-04 DIAGNOSIS — Z806 Family history of leukemia: Secondary | ICD-10-CM

## 2024-04-04 DIAGNOSIS — Z79899 Other long term (current) drug therapy: Secondary | ICD-10-CM | POA: Diagnosis not present

## 2024-04-04 DIAGNOSIS — Z811 Family history of alcohol abuse and dependence: Secondary | ICD-10-CM

## 2024-04-04 DIAGNOSIS — I442 Atrioventricular block, complete: Principal | ICD-10-CM

## 2024-04-04 DIAGNOSIS — R531 Weakness: Secondary | ICD-10-CM

## 2024-04-04 DIAGNOSIS — Z801 Family history of malignant neoplasm of trachea, bronchus and lung: Secondary | ICD-10-CM

## 2024-04-04 HISTORY — PX: TEMPORARY PACEMAKER: CATH118268

## 2024-04-04 LAB — BASIC METABOLIC PANEL WITH GFR
Anion gap: 12 (ref 5–15)
BUN: 5 mg/dL — ABNORMAL LOW (ref 8–23)
CO2: 23 mmol/L (ref 22–32)
Calcium: 8.8 mg/dL — ABNORMAL LOW (ref 8.9–10.3)
Chloride: 102 mmol/L (ref 98–111)
Creatinine, Ser: 1.05 mg/dL — ABNORMAL HIGH (ref 0.44–1.00)
GFR, Estimated: 54 mL/min — ABNORMAL LOW (ref >60)
Glucose, Bld: 106 mg/dL — ABNORMAL HIGH (ref 70–99)
Potassium: 3.5 mmol/L (ref 3.5–5.1)
Sodium: 137 mmol/L (ref 135–145)

## 2024-04-04 LAB — CBC
HCT: 36 % (ref 36.0–46.0)
Hemoglobin: 11.4 g/dL — ABNORMAL LOW (ref 12.0–15.0)
MCH: 28.9 pg (ref 26.0–34.0)
MCHC: 31.7 g/dL (ref 30.0–36.0)
MCV: 91.4 fL (ref 80.0–100.0)
Platelets: 256 10*3/uL (ref 150–400)
RBC: 3.94 MIL/uL (ref 3.87–5.11)
RDW: 13.7 % (ref 11.5–15.5)
WBC: 7 10*3/uL (ref 4.0–10.5)
nRBC: 0 % (ref 0.0–0.2)

## 2024-04-04 LAB — I-STAT CHEM 8, ED
BUN: 4 mg/dL — ABNORMAL LOW (ref 8–23)
Calcium, Ion: 1.13 mmol/L — ABNORMAL LOW (ref 1.15–1.40)
Chloride: 103 mmol/L (ref 98–111)
Creatinine, Ser: 1.1 mg/dL — ABNORMAL HIGH (ref 0.44–1.00)
Glucose, Bld: 106 mg/dL — ABNORMAL HIGH (ref 70–99)
HCT: 35 % — ABNORMAL LOW (ref 36.0–46.0)
Hemoglobin: 11.9 g/dL — ABNORMAL LOW (ref 12.0–15.0)
Potassium: 3.6 mmol/L (ref 3.5–5.1)
Sodium: 138 mmol/L (ref 135–145)
TCO2: 25 mmol/L (ref 22–32)

## 2024-04-04 LAB — TROPONIN I (HIGH SENSITIVITY)
Troponin I (High Sensitivity): 12 ng/L (ref <18)
Troponin I (High Sensitivity): 14 ng/L (ref <18)

## 2024-04-04 SURGERY — TEMPORARY PACEMAKER
Anesthesia: LOCAL

## 2024-04-04 MED ORDER — HEPARIN (PORCINE) IN NACL 1000-0.9 UT/500ML-% IV SOLN
INTRAVENOUS | Status: DC | PRN
  Administered 2024-04-04: 500 mL

## 2024-04-04 MED ORDER — FENTANYL CITRATE (PF) 100 MCG/2ML IJ SOLN
INTRAMUSCULAR | Status: DC | PRN
  Administered 2024-04-04: 25 ug via INTRAVENOUS

## 2024-04-04 MED ORDER — TRAZODONE HCL 50 MG PO TABS: 50.0000 mg | ORAL_TABLET | Freq: Every day | ORAL | Status: DC

## 2024-04-04 MED ORDER — HYDRALAZINE HCL 20 MG/ML IJ SOLN: 5.0000 mg | Freq: Four times a day (QID) | INTRAMUSCULAR | Status: DC | PRN

## 2024-04-04 MED ORDER — MIDAZOLAM HCL 2 MG/2ML IJ SOLN
INTRAMUSCULAR | Status: AC
Start: 2024-04-04 — End: ?
  Filled 2024-04-04: qty 2

## 2024-04-04 MED ORDER — MORPHINE SULFATE (PF) 2 MG/ML IV SOLN
1.0000 mg | INTRAVENOUS | Status: DC | PRN
  Administered 2024-04-04: 1 mg via INTRAVENOUS
  Filled 2024-04-04: qty 1

## 2024-04-04 MED ORDER — LIDOCAINE HCL (PF) 1 % IJ SOLN
INTRAMUSCULAR | Status: DC | PRN
  Administered 2024-04-04: 2 mL

## 2024-04-04 MED ORDER — ACETAMINOPHEN 325 MG PO TABS: 650.0000 mg | ORAL_TABLET | Freq: Four times a day (QID) | ORAL | Status: DC | PRN

## 2024-04-04 MED ORDER — LIDOCAINE HCL (PF) 1 % IJ SOLN
INTRAMUSCULAR | Status: AC
  Filled 2024-04-04: qty 30

## 2024-04-04 MED ORDER — TRAZODONE HCL 50 MG PO TABS
25.0000 mg | ORAL_TABLET | Freq: Every day | ORAL | Status: DC
  Administered 2024-04-04 – 2024-04-05 (×2): 25 mg via ORAL
  Filled 2024-04-04 (×2): qty 1

## 2024-04-04 MED ORDER — TRAZODONE HCL 50 MG PO TABS: 100.0000 mg | ORAL_TABLET | Freq: Every day | ORAL | Status: DC

## 2024-04-04 MED ORDER — HYDRALAZINE HCL 20 MG/ML IJ SOLN
10.0000 mg | Freq: Four times a day (QID) | INTRAMUSCULAR | Status: DC | PRN
  Administered 2024-04-04: 10 mg via INTRAVENOUS
  Filled 2024-04-04: qty 1

## 2024-04-04 MED ORDER — FENTANYL CITRATE (PF) 100 MCG/2ML IJ SOLN
INTRAMUSCULAR | Status: AC
  Filled 2024-04-04: qty 2

## 2024-04-04 MED ORDER — POLYETHYLENE GLYCOL 3350 17 G PO PACK: 17.0000 g | PACK | Freq: Every day | ORAL | Status: DC | PRN

## 2024-04-04 MED ORDER — GABAPENTIN 300 MG PO CAPS
300.0000 mg | ORAL_CAPSULE | Freq: Three times a day (TID) | ORAL | Status: DC
  Administered 2024-04-04 – 2024-04-06 (×5): 300 mg via ORAL
  Filled 2024-04-04 (×5): qty 1

## 2024-04-04 MED ORDER — ACETAMINOPHEN 650 MG RE SUPP: 650.0000 mg | Freq: Four times a day (QID) | RECTAL | Status: DC | PRN

## 2024-04-04 MED ORDER — MIDAZOLAM HCL 2 MG/2ML IJ SOLN
INTRAMUSCULAR | Status: DC | PRN
  Administered 2024-04-04: 1 mg via INTRAVENOUS

## 2024-04-04 MED ORDER — GABAPENTIN 300 MG PO CAPS: 300.0000 mg | ORAL_CAPSULE | Freq: Three times a day (TID) | ORAL | Status: DC

## 2024-04-04 SURGICAL SUPPLY — 5 items
CABLE ADAPT PACING TEMP 12FT (ADAPTER) IMPLANT
SHEATH PINNACLE 6F 10CM (SHEATH) IMPLANT
SHEATH PROBE COVER 6X72 (BAG) IMPLANT
WIRE MICRO SET SILHO 5FR 7 (SHEATH) IMPLANT
WIRE PACING TEMP ST TIP 5 (CATHETERS) IMPLANT

## 2024-04-04 NOTE — ED Provider Notes (Signed)
 [facility]    CSN: [id number] Arrival date & time: 04/04/24  0913      History   Chief Complaint Chief Complaint  Patient presents with   Weakness    HPI Kayla Terry is a 78 y.o. female.   Patient presents today with approximately 1 week history of feeling unwell.  She endorses generalized weakness, intermittent dizziness, chest pain, and feeling wobbly.  Reports she has a prescription for a blood pressure medication that she takes as needed and has recently take been taking it more frequently because it has been higher than normal.     Past Medical History:  Diagnosis Date   Allergy    B12 deficiency 04/2020   Cataract    Coronary artery disease    COVID-19 04/22/2021   Diverticulitis    10/19/2022   Fatty liver    GERD (gastroesophageal reflux disease)    Herpes zoster without complications    Hiatal hernia    Hypertension    Mild mitral regurgitation 2013   Mobitz type 2 second degree atrioventricular block    Pectus excavatum    pectus excavatum deformity resulting in mass effect on the RV by CT 03/2020   Pulmonary nodule    Shingles    Stroke (HCC)    10/19/2022    Patient Active Problem List   Diagnosis Date Noted   Pain of left heel 02/06/2024   Spinal stenosis of lumbar region 08/15/2023   Benign neoplasm of colon 01/17/2023   Acute stroke due to ischemia (HCC) 10/21/2022   Diverticulitis of colon 10/19/2022   Leukocytosis 10/19/2022   Abdominal pain 08/02/2022   Hypertensive urgency 08/01/2022   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/06/2022   COVID-19 virus infection 04/23/2021   Coronary artery disease 04/23/2021   Chest pain 04/22/2021   Chest pain of uncertain etiology    Nonspecific abnormal electrocardiogram (ECG) (EKG)    2nd degree AV block 03/25/2021   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   B12 deficiency 05/07/2020   Muscle weakness 05/06/2020   Benign essential hypertension  06/08/2017   Acid reflux disease 03/08/2016   Hyperlipidemia 03/08/2016   Nocturia 03/08/2016   Restless leg syndrome 03/08/2016   Chest tightness 08/21/2012    Past Surgical History:  Procedure Laterality Date   ABDOMINAL HYSTERECTOMY  02/1984   BACK SURGERY     CATARACT EXTRACTION, BILATERAL     COLONOSCOPY WITH PROPOFOL  N/A 01/17/2023   Procedure: COLONOSCOPY WITH PROPOFOL ;  Surgeon: Ace Holder, MD;  Location: [facility];  Service: Gastroenterology;  Laterality: N/A;   LEFT HEART CATH AND CORONARY ANGIOGRAPHY N/A 04/22/2021   Procedure: LEFT HEART CATH AND CORONARY ANGIOGRAPHY;  Surgeon: Odie Benne, MD;  Location: [facility];  Service: Cardiovascular;  Laterality: N/A;   LUMBAR LAMINECTOMY     OOPHORECTOMY  02/29/1984   unilateral   POLYPECTOMY  01/17/2023   Procedure: POLYPECTOMY;  Surgeon: Ace Holder, MD;  Location: [facility];  Service: Gastroenterology;;    OB History   No obstetric history on file.      Home Medications    Prior to Admission medications   Medication Sig Start Date End Date Taking? Authorizing Provider  amLODipine  (NORVASC ) 5 MG tablet Take 5 mg by mouth daily as needed (High BP).   Yes [provider]  aspirin  EC 81 MG tablet Take 1 tablet (81 mg total) by mouth daily. Swallow whole. 10/22/22   Johnson, Clanford L,  MD  dicyclomine  (BENTYL ) 10 MG capsule Take 1 capsule (10 mg total) by mouth every 8 (eight) hours as needed for spasms. 12/04/23   Armbruster, Lendon Queen, MD  famotidine  (PEPCID ) 20 MG tablet Take 20 mg by mouth daily as needed for heartburn or indigestion.    [provider]  gabapentin  (NEURONTIN ) 300 MG capsule Take 1-2 capsules (300-600 mg total) by mouth 3 (three) times daily. 06/14/23  Yes Glory Larsen, MD  losartan  (COZAAR ) 50 MG tablet Take 1 tablet (50 mg total) by mouth daily. Patient taking differently: Take 50 mg by mouth daily as needed (High BP). 10/22/22   Rayfield Cairo, MD  methylcellulose (CITRUCEL) oral powder Take once daily 12/04/23   Armbruster, Lendon Queen, MD  Peppermint Oil (IBGARD) 90 MG CPCR Take 90 mg by mouth daily as needed (IBS).    [provider]  Propylene Glycol (SYSTANE BALANCE) 0.6 % SOLN Place 1 drop into both eyes daily as needed (dry eye).    [provider]  traZODone  (DESYREL ) 100 MG tablet Take 100 mg by mouth at bedtime. 03/02/22   [provider]  zolpidem  (AMBIEN ) 10 MG tablet Take 10 mg by mouth at bedtime.    [provider]    Family History Family History  Problem Relation Age of Onset   Cancer Mother    Leukemia Mother    Lung cancer Father    Multiple myeloma Father    Multiple sclerosis Sister    Alcohol  abuse Brother    Multiple myeloma Brother    Colon cancer Neg Hx    Colon polyps Neg Hx    Stomach cancer Neg Hx    Esophageal cancer Neg Hx    Neuropathy Neg Hx    Stroke Neg Hx     Social History Social History   Tobacco Use   Smoking status: Never    Passive exposure: Never   Smokeless tobacco: Never  Vaping Use   Vaping status: Never Used  Substance Use Topics   Alcohol  use: No   Drug use: No     Allergies   Fluvoxamine, Lexapro [escitalopram], Protonix  [pantoprazole ], and Naproxen sodium   Review of Systems Review of Systems Per HPI  Physical Exam Triage Vital Signs ED Triage Vitals  Encounter Vitals Group     BP 04/04/24 0936 130/70     Systolic BP Percentile --      Diastolic BP Percentile --      Pulse Rate 04/04/24 0936 (!) 29     Resp 04/04/24 0936 18     Temp 04/04/24 0936 97.9 F (36.6 C)     Temp Source 04/04/24 0936 Oral     SpO2 04/04/24 0936 97 %     Weight --      Height --      Head Circumference --      Peak Flow --      Pain Score 04/04/24 0937 4     Pain Loc --      Pain Education --      Exclude from Growth Chart --    No data found.  Updated Vital Signs BP 130/70 (BP Location: Right Arm) Comment: initial  automative bp 117/82, upon repeating BP unable to obtain on automatic. manual obtained.  Pulse (!) 29   Temp 97.9 F (36.6 C) (Oral)   Resp 18   SpO2 97%   Visual Acuity Right Eye Distance:   Left Eye Distance:   Bilateral  Distance:    Right Eye Near:   Left Eye Near:    Bilateral Near:     Physical Exam Vitals and nursing note reviewed.  Constitutional:      General: She is not in acute distress.    Appearance: Normal appearance. She is ill-appearing. She is not toxic-appearing.  HENT:     Head: Normocephalic.     Right Ear: External ear normal.     Left Ear: External ear normal.     Mouth/Throat:     Mouth: Mucous membranes are moist.     Pharynx: Oropharynx is clear.  Cardiovascular:     Rate and Rhythm: Bradycardia present.  Pulmonary:     Effort: Pulmonary effort is normal. No respiratory distress.  Skin:    Coloration: Skin is pale.  Neurological:     Mental Status: She is alert.  Psychiatric:        Behavior: Behavior is cooperative.      UC Treatments / Results  Labs (all labs ordered are listed, but only abnormal results are displayed) Labs Reviewed - No data to display  EKG   Radiology No results found.  Procedures Procedures (including critical care time)  Medications Ordered in UC Medications - No data to display  Initial Impression / Assessment and Plan / UC Course  I have reviewed the triage vital signs and the nursing notes.  Pertinent labs & imaging results that were available during my care of the patient were reviewed by me and considered in my medical decision making (see chart for details).   In triage, patient is bradycardic into the high 20s/low 30s, otherwise vital signs are stable.  1. Dizziness 2. Weakness 3. Chest pain, unspecified type 4. Bradycardia 5. Second degree heart block Unclear etiology for symptoms, likely related to second-degree heart block I recommended emergent evaluation and transportation by  EMS Patient is agreeable to plan, I called daughter and updated her EMS activated, report they will likely take her [facility]; daughter updated by clinical staff Patient departed urgent care in stable condition  The patient was given the opportunity to ask questions.  All questions answered to their satisfaction.  The patient is in agreement to this plan.   Final Clinical Impressions(s) / UC Diagnoses   Final diagnoses:  Dizziness  Weakness  Chest pain, unspecified type  Bradycardia  Second degree heart block   Discharge Instructions   None    ED Prescriptions   None    PDMP not reviewed this encounter.   Wilhemena Harbour, NP 04/04/24 [phone]
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
## 2024-04-04 NOTE — ED Notes (Signed)
 Patient is being discharged from the Urgent Care and sent to the Emergency Department via EMS. Per Thena Fireman, NP, patient is in need of higher level of care due to bradycardia and chest Pain. Patient is aware and verbalizes understanding of plan of care.  Vitals:   04/04/24 0936  BP: 130/70  Pulse: (!) 29  Resp: 18  Temp: 97.9 F (36.6 C)  SpO2: 97%

## 2024-04-04 NOTE — Progress Notes (Signed)
 Had previously discussed possibility of temporary pacing wire with patient and daughter. Called daughter, Mariah Shines at [phone], to update on plan of care.  No answer, message left for return call.      Updated patient, will plan to move forward with temporary pacing wire.      Creighton Doffing, NP-C, AGACNP-BC [facility] - Electrophysiology  04/04/2024, 4:21 PM

## 2024-04-04 NOTE — ED Notes (Signed)
 Ems at bedside 937. Reports given to EMS. CMA and NP notified pt family of transport.

## 2024-04-04 NOTE — ED Provider Notes (Signed)
 [place] EMERGENCY DEPARTMENT AT [facility] Provider Note   CSN: [id number] Arrival date & time: 04/04/24  1025     History  Chief Complaint  Patient presents with   Bradycardia    Kayla Terry is a 78 y.o. female.  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  She reports that she has not been feeling well for several days.  She went to urgent care and was sent to the ED.  She denies chest pain or dyspnea.      Home Medications Prior to Admission medications   Medication Sig Start Date End Date Taking? Authorizing Provider  amLODipine  (NORVASC ) 5 MG tablet Take 5 mg by mouth daily as needed (High BP).    [provider]  aspirin  EC 81 MG tablet Take 1 tablet (81 mg total) by mouth daily. Swallow whole. 10/22/22   Johnson, Clanford L, MD  dicyclomine  (BENTYL ) 10 MG capsule Take 1 capsule (10 mg total) by mouth every 8 (eight) hours as needed for spasms. 12/04/23   Armbruster, Lendon Queen, MD  famotidine  (PEPCID ) 20 MG tablet Take 20 mg by mouth daily as needed for heartburn or indigestion.    [provider]  gabapentin  (NEURONTIN ) 300 MG capsule Take 1-2 capsules (300-600 mg total) by mouth 3 (three) times daily. 06/14/23   Glory Larsen, MD  losartan  (COZAAR ) 50 MG tablet Take 1 tablet (50 mg total) by mouth daily. Patient taking differently: Take 50 mg by mouth daily as needed (High BP). 10/22/22   Rayfield Cairo, MD  methylcellulose (CITRUCEL) oral powder Take once daily 12/04/23   Armbruster, Lendon Queen, MD  Peppermint Oil (IBGARD) 90 MG CPCR Take 90 mg by mouth daily as needed (IBS).    [provider]  Propylene Glycol (SYSTANE BALANCE) 0.6 % SOLN Place 1 drop into both eyes daily as needed (dry eye).    [provider]  traZODone  (DESYREL ) 100 MG tablet Take 100 mg by mouth at bedtime. 03/02/22   [provider]  zolpidem  (AMBIEN ) 10 MG tablet Take 10 mg by mouth at bedtime.    [provider]      Allergies    Fluvoxamine, Lexapro [escitalopram], Protonix  [pantoprazole ], and Naproxen sodium    Review of Systems   Review of Systems  Physical Exam Updated Vital Signs BP (!) 156/118   Pulse (!) 29   Temp 97.9 F (36.6 C) (Oral)   Resp 11   Ht 1.6 m (5\' 3" )   Wt 63.5 kg   SpO2 100%   BMI 24.80 kg/m  Physical Exam Vitals reviewed.  HENT:     Head: Normocephalic and atraumatic.     Right Ear: External ear normal.     Left Ear: External ear normal.     Nose: Nose normal.     Mouth/Throat:     Pharynx: Oropharynx is clear.  Eyes:     Extraocular Movements: Extraocular movements intact.     Pupils: Pupils are equal, round, and reactive to light.  Cardiovascular:     Rate and Rhythm: Regular rhythm. Bradycardia present.  Pulmonary:     Effort: Pulmonary effort is normal.     Breath sounds: Normal breath sounds.  Abdominal:     General: Abdomen is flat. Bowel sounds are normal.     Palpations: Abdomen is soft.  Musculoskeletal:  General: Normal range of motion.     Cervical back: Normal range of motion.  Skin:    General: Skin is warm.     Capillary Refill: Capillary refill takes less than 2 seconds.  Neurological:     General: No focal deficit present.     Mental Status: She is alert.  Psychiatric:        Mood and Affect: Mood normal.     ED Results / Procedures / Treatments   Labs (all labs ordered are listed, but only abnormal results are displayed) Labs Reviewed  CBC - Abnormal; Notable for the following components:      Result Value   Hemoglobin 11.4 (*)    All other components within normal limits  BASIC METABOLIC PANEL WITH GFR - Abnormal; Notable for the following components:   Glucose, Bld 106 (*)    BUN 5 (*)    Creatinine, Ser 1.05 (*)    Calcium  8.8 (*)    GFR, Estimated 54 (*)    All other components within normal limits   I-STAT CHEM 8, ED - Abnormal; Notable for the following components:   BUN 4 (*)    Creatinine, Ser 1.10 (*)    Glucose, Bld 106 (*)    Calcium , Ion 1.13 (*)    Hemoglobin 11.9 (*)    HCT 35.0 (*)    All other components within normal limits  TROPONIN I (HIGH SENSITIVITY)  TROPONIN I (HIGH SENSITIVITY)    EKG EKG Interpretation Date/Time:  Thursday April 04 2024 10:31:53 EDT Ventricular Rate:  37 PR Interval:  220 QRS Duration:  139 QT Interval:  518 QTC Calculation: 407 R Axis:   -57  Text Interpretation: bradycardia with mobitz type II Probable left atrial enlargement Left bundle branch block Reconfirmed by Auston Blush [phone]) on 04/04/2024 11:07:14 AM  Radiology DG Chest Port 1 View Result Date: 04/04/2024 CLINICAL DATA:  Weakness. EXAM: PORTABLE CHEST 1 VIEW COMPARISON:  04/10/2023. FINDINGS: Defibrillator pad overlies the left chest wall. The heart size is mildly enlarged. Mediastinal contours are within normal limits. No focal consolidation, pleural effusion, or pneumothorax. No acute osseous abnormality. IMPRESSION: Mildly enlarged cardiac silhouette. Otherwise, no acute cardiopulmonary findings. Electronically Signed   By: Mannie Seek M.D.   On: 04/04/2024 11:26    Procedures Procedures    Medications Ordered in ED Medications - No data to display  ED Course/ Medical Decision Making/ A&P Clinical Course as of 04/04/24 1605  Thu Apr 04, 2024  1603 CBC reviewed interpreted mild anemia no Chest x-Rhyland Hinderliter with mildly enlarged cardiac silhouette otherwise within normal limits  [DR]    Clinical Course User Index [DR] Auston Blush, MD                                 Medical Decision Making Amount and/or Complexity of Data Reviewed Labs: ordered. Radiology: ordered.   Care discussed with Evan and, on-call for [facility] and they will see DDX- patient includes but not limited to complete heart block, mobitz II, due to ischemia, electrolyte abnormality CBC reviewed  with mild anemia Electrolytes within normal limits Troponin within normal limits and flat Mildly enlarged cardiac silhouette seen on chest otherwise within normal limits        Final Clinical Impression(s) / ED Diagnoses Final diagnoses:  Mobitz type 2 second degree atrioventricular block    Rx / DC Orders ED Discharge Orders  None         Auston Blush, MD 04/04/24 [phone]

## 2024-04-04 NOTE — ED Triage Notes (Signed)
 Pt BIBA from home w/ c/o weakness and 3rd degree heart block from EMS. Pt stated she started to feel weak on Monday and not "herself." Pr has a cardiologist and suppopsed to have a pacemaker but does not know why. Pt also stated that she took her BP medication this morning when she was not supposed to but stated that her BP has been "high." HR in 20-30's, all other vitals are stable. GCS 15 and ambulated to the bathroom

## 2024-04-04 NOTE — ED Triage Notes (Signed)
 Pt reports generalized weakness,intermittent dizziness, "I just don't feel right and don't know how to describe it, feel wobbly" since Thursday night. Pt noted to be pale, reports "I dont typically take bp medicine but its as needed and it has been higher since all this started so I've been taking it."

## 2024-04-04 NOTE — ED Notes (Signed)
 Patient is being discharged from the Urgent Care and sent to the Emergency Department via EMS . Per NP, patient is in need of higher level of care due to abnormal EKG,bradycardia. Patient is aware and verbalizes understanding of plan of care.  Vitals:   04/04/24 0936  BP: 130/70  Pulse: (!) 29  Resp: 18  Temp: 97.9 F (36.6 C)  SpO2: 97%

## 2024-04-05 ENCOUNTER — Encounter (HOSPITAL_COMMUNITY)
Admission: EM | Disposition: A | Discharge status: Home / Self Care | Payer: Self-pay | Source: Home / Self Care | Attending: Cardiology

## 2024-04-05 ENCOUNTER — Encounter: Payer: Self-pay | Admitting: Emergency Medicine

## 2024-04-05 ENCOUNTER — Inpatient Hospital Stay (HOSPITAL_COMMUNITY): Admitting: Anesthesiology

## 2024-04-05 ENCOUNTER — Inpatient Hospital Stay (HOSPITAL_COMMUNITY)

## 2024-04-05 DIAGNOSIS — I639 Cerebral infarction, unspecified: Secondary | ICD-10-CM

## 2024-04-05 DIAGNOSIS — I10 Essential (primary) hypertension: Secondary | ICD-10-CM | POA: Diagnosis not present

## 2024-04-05 DIAGNOSIS — R9431 Abnormal electrocardiogram [ECG] [EKG]: Secondary | ICD-10-CM

## 2024-04-05 DIAGNOSIS — I2510 Atherosclerotic heart disease of native coronary artery without angina pectoris: Secondary | ICD-10-CM | POA: Diagnosis not present

## 2024-04-05 DIAGNOSIS — I442 Atrioventricular block, complete: Secondary | ICD-10-CM

## 2024-04-05 HISTORY — PX: PACEMAKER IMPLANT: EP1218

## 2024-04-05 LAB — CBC
HCT: 39.6 % (ref 36.0–46.0)
Hemoglobin: 13.1 g/dL (ref 12.0–15.0)
MCH: 29.2 pg (ref 26.0–34.0)
MCHC: 33.1 g/dL (ref 30.0–36.0)
MCV: 88.2 fL (ref 80.0–100.0)
Platelets: 265 10*3/uL (ref 150–400)
RBC: 4.49 MIL/uL (ref 3.87–5.11)
RDW: 14 % (ref 11.5–15.5)
WBC: 7.2 10*3/uL (ref 4.0–10.5)
nRBC: 0 % (ref 0.0–0.2)

## 2024-04-05 LAB — ECHOCARDIOGRAM COMPLETE
Area-P 1/2: 3.48 cm2
Height: 63 in
S' Lateral: 2.4 cm
Weight: 2240 [oz_av]

## 2024-04-05 LAB — BASIC METABOLIC PANEL WITH GFR
Anion gap: 9 (ref 5–15)
BUN: <5 mg/dL — ABNORMAL LOW (ref 8–23)
CO2: 24 mmol/L (ref 22–32)
Calcium: 9 mg/dL (ref 8.9–10.3)
Chloride: 104 mmol/L (ref 98–111)
Creatinine, Ser: 0.98 mg/dL (ref 0.44–1.00)
GFR, Estimated: 59 mL/min — ABNORMAL LOW (ref >60)
Glucose, Bld: 118 mg/dL — ABNORMAL HIGH (ref 70–99)
Potassium: 3.6 mmol/L (ref 3.5–5.1)
Sodium: 137 mmol/L (ref 135–145)

## 2024-04-05 LAB — GLUCOSE, CAPILLARY: Glucose-Capillary: 158 mg/dL — ABNORMAL HIGH (ref 70–99)

## 2024-04-05 SURGERY — PACEMAKER IMPLANT
Anesthesia: Monitor Anesthesia Care

## 2024-04-05 MED ORDER — FENTANYL CITRATE (PF) 100 MCG/2ML IJ SOLN
INTRAMUSCULAR | Status: AC
  Filled 2024-04-05: qty 2

## 2024-04-05 MED ORDER — SODIUM CHLORIDE 0.9 % IV SOLN: INTRAVENOUS | Status: DC

## 2024-04-05 MED ORDER — MIDAZOLAM HCL 2 MG/2ML IJ SOLN
INTRAMUSCULAR | Status: AC
  Filled 2024-04-05: qty 2

## 2024-04-05 MED ORDER — CEFAZOLIN SODIUM-DEXTROSE 2-4 GM/100ML-% IV SOLN
INTRAVENOUS | Status: AC
  Filled 2024-04-05: qty 100

## 2024-04-05 MED ORDER — HYDROCODONE-ACETAMINOPHEN 5-325 MG PO TABS
1.0000 | ORAL_TABLET | ORAL | Status: AC | PRN
  Administered 2024-04-05 (×2): 1 via ORAL
  Filled 2024-04-05 (×2): qty 1

## 2024-04-05 MED ORDER — LIDOCAINE HCL 1 % IJ SOLN
INTRAMUSCULAR | Status: AC
  Filled 2024-04-05: qty 60

## 2024-04-05 MED ORDER — CEFAZOLIN SODIUM-DEXTROSE 2-4 GM/100ML-% IV SOLN
2.0000 g | INTRAVENOUS | Status: AC
Start: 2024-04-05 — End: 2024-04-05
  Administered 2024-04-05: 2 g via INTRAVENOUS
  Filled 2024-04-05: qty 100

## 2024-04-05 MED ORDER — SODIUM CHLORIDE 0.9 % IV SOLN
INTRAVENOUS | Status: AC
  Filled 2024-04-05: qty 2

## 2024-04-05 MED ORDER — MIDAZOLAM HCL 2 MG/2ML IJ SOLN
INTRAMUSCULAR | Status: DC | PRN
  Administered 2024-04-05: 1 mg via INTRAVENOUS

## 2024-04-05 MED ORDER — PHENYLEPHRINE HCL-NACL 20-0.9 MG/250ML-% IV SOLN
INTRAVENOUS | Status: DC | PRN
  Administered 2024-04-05: 30 ug/min via INTRAVENOUS
  Administered 2024-04-05 (×2): 120 ug via INTRAVENOUS

## 2024-04-05 MED ORDER — OXYCODONE HCL 5 MG PO TABS: 5.0000 mg | ORAL_TABLET | Freq: Once | ORAL | Status: DC | PRN

## 2024-04-05 MED ORDER — SODIUM CHLORIDE 0.9 % IV SOLN
80.0000 mg | INTRAVENOUS | Status: AC
  Administered 2024-04-05: 80 mg
  Filled 2024-04-05: qty 2

## 2024-04-05 MED ORDER — AMLODIPINE BESYLATE 5 MG PO TABS
5.0000 mg | ORAL_TABLET | Freq: Every day | ORAL | Status: DC
  Administered 2024-04-05 – 2024-04-06 (×2): 5 mg via ORAL
  Filled 2024-04-05 (×2): qty 1

## 2024-04-05 MED ORDER — CHLORHEXIDINE GLUCONATE CLOTH 2 % EX PADS
6.0000 | MEDICATED_PAD | Freq: Every day | CUTANEOUS | Status: DC
  Administered 2024-04-05: 6 via TOPICAL

## 2024-04-05 MED ORDER — ZOLPIDEM TARTRATE 5 MG PO TABS
5.0000 mg | ORAL_TABLET | Freq: Every day | ORAL | Status: AC
  Administered 2024-04-05: 5 mg via ORAL
  Filled 2024-04-05: qty 1

## 2024-04-05 MED ORDER — ONDANSETRON HCL 4 MG/2ML IJ SOLN
INTRAMUSCULAR | Status: DC | PRN
  Administered 2024-04-05: 4 mg via INTRAVENOUS

## 2024-04-05 MED ORDER — FENTANYL CITRATE (PF) 100 MCG/2ML IJ SOLN
INTRAMUSCULAR | Status: DC | PRN
  Administered 2024-04-05 (×2): 25 ug via INTRAVENOUS

## 2024-04-05 MED ORDER — LIDOCAINE 2% (20 MG/ML) 5 ML SYRINGE
INTRAMUSCULAR | Status: DC | PRN
  Administered 2024-04-05: 40 mg via INTRAVENOUS

## 2024-04-05 MED ORDER — PROPOFOL 500 MG/50ML IV EMUL
INTRAVENOUS | Status: DC | PRN
  Administered 2024-04-05: 50 ug/kg/min via INTRAVENOUS

## 2024-04-05 MED ORDER — FENTANYL CITRATE (PF) 100 MCG/2ML IJ SOLN: 25.0000 ug | INTRAMUSCULAR | Status: DC | PRN

## 2024-04-05 MED ORDER — CHLORHEXIDINE GLUCONATE 4 % EX SOLN
60.0000 mL | Freq: Once | CUTANEOUS | Status: AC
  Administered 2024-04-05: 4 via TOPICAL

## 2024-04-05 MED ORDER — ONDANSETRON HCL 4 MG/2ML IJ SOLN: 4.0000 mg | Freq: Four times a day (QID) | INTRAMUSCULAR | Status: DC | PRN

## 2024-04-05 MED ORDER — OXYCODONE HCL 5 MG/5ML PO SOLN: 5.0000 mg | Freq: Once | ORAL | Status: DC | PRN

## 2024-04-05 MED ORDER — HEPARIN (PORCINE) IN NACL 1000-0.9 UT/500ML-% IV SOLN
INTRAVENOUS | Status: DC | PRN
  Administered 2024-04-05: 500 mL

## 2024-04-05 MED ORDER — SODIUM CHLORIDE 0.9 % IV SOLN
INTRAVENOUS | Status: DC | PRN
Start: 2024-04-05 — End: 2024-04-05

## 2024-04-05 MED ORDER — CHLORHEXIDINE GLUCONATE 4 % EX SOLN
60.0000 mL | Freq: Once | CUTANEOUS | Status: AC
  Administered 2024-04-05: 4 via TOPICAL
  Filled 2024-04-05: qty 60

## 2024-04-05 MED ORDER — DEXAMETHASONE SODIUM PHOSPHATE 10 MG/ML IJ SOLN
INTRAMUSCULAR | Status: DC | PRN
  Administered 2024-04-05: 5 mg via INTRAVENOUS

## 2024-04-05 MED ORDER — PROPOFOL 10 MG/ML IV BOLUS
INTRAVENOUS | Status: DC | PRN
Start: 2024-04-05 — End: 2024-04-05
  Administered 2024-04-05: 40 mg via INTRAVENOUS
  Administered 2024-04-05: 20 mg via INTRAVENOUS

## 2024-04-05 MED ORDER — LIDOCAINE HCL (PF) 1 % IJ SOLN
INTRAMUSCULAR | Status: DC | PRN
Start: 2024-04-05 — End: 2024-04-05
  Administered 2024-04-05: 60 mL

## 2024-04-05 MED ORDER — HYDROCODONE-ACETAMINOPHEN 5-325 MG PO TABS
1.0000 | ORAL_TABLET | ORAL | Status: AC | PRN
  Administered 2024-04-05: 1 via ORAL
  Filled 2024-04-05: qty 1

## 2024-04-05 SURGICAL SUPPLY — 11 items
CABLE SURGICAL S-101-97-12 (CABLE) ×2 IMPLANT
CATH RIGHTSITE C315HIS02 (CATHETERS) IMPLANT
IPG PACE AZUR XT DR MRI W1DR01 (Pacemaker) IMPLANT
LEAD CAPSURE NOVUS 5076-52CM (Lead) IMPLANT
LEAD SELECT SECURE 3830 383069 (Lead) IMPLANT
PAD DEFIB RADIO PHYSIO CONN (PAD) ×2 IMPLANT
SHEATH 7FR PRELUDE SNAP 13 (SHEATH) IMPLANT
SHEATH PROBE COVER 6X72 (BAG) IMPLANT
SLITTER 6232ADJ (MISCELLANEOUS) IMPLANT
TRAY PACEMAKER INSERTION (PACKS) ×2 IMPLANT
WIRE HI TORQ VERSACORE-J 145CM (WIRE) IMPLANT

## 2024-04-05 NOTE — Anesthesia Postprocedure Evaluation (Signed)
 Anesthesia Post Note  Patient: Chauncey Bruno  Procedure(s) Performed: PACEMAKER IMPLANT     Patient location during evaluation: ICU Anesthesia Type: MAC Level of consciousness: awake and alert Pain management: pain level controlled Vital Signs Assessment: post-procedure vital signs reviewed and stable Respiratory status: spontaneous breathing, nonlabored ventilation and respiratory function stable Cardiovascular status: blood pressure returned to baseline Postop Assessment: no apparent nausea or vomiting Anesthetic complications: no   There were no known notable events for this encounter.  Last Vitals:  Vitals:   04/05/24 0900 04/05/24 1133  BP: (!) 152/93 (!) 151/84  Pulse: 60 66  Resp: 17 18  Temp:  36.8 C  SpO2: 95% 92%    Last Pain:  Vitals:   04/05/24 1133  TempSrc: Axillary  PainSc:                  Rayfield Cairo

## 2024-04-05 NOTE — Progress Notes (Signed)
 Rounding Note   Patient Name: Kayla Terry Date of Encounter: 04/05/2024  [facility] Cardiologist: Lauro Portal, MD   Subjective  "I feel fine"  Scheduled Meds:  amLODipine   5 mg Oral Daily   chlorhexidine  60 mL Topical Once   gabapentin   300 mg Oral TID   gentamicin (GARAMYCIN) 80 mg in sodium chloride  0.9 % 500 mL irrigation  80 mg Irrigation On Call   traZODone   25 mg Oral QHS   Continuous Infusions:  sodium chloride  50 mL/hr at 04/05/24 0700   sodium chloride  50 mL/hr at 04/05/24 0700    ceFAZolin (ANCEF) IV     PRN Meds: acetaminophen  **OR** acetaminophen , hydrALAZINE , HYDROcodone -acetaminophen , morphine  injection, polyethylene glycol   Vital Signs  Vitals:   04/05/24 0600 04/05/24 0615 04/05/24 0630 04/05/24 0645  BP: 133/79 (!) 141/76 131/88 139/76  Pulse: 60 60 60 60  Resp: 16 17 16 15   Temp:      TempSrc:      SpO2: 95% 96% 94% 95%  Weight:      Height:        Intake/Output Summary (Last 24 hours) at 04/05/2024 0747 Last data filed at 04/05/2024 0700 Gross per 24 hour  Intake 78.49 ml  Output 1000 ml  Net -921.51 ml      04/04/2024   11:07 AM 12/27/2023    1:22 PM 12/04/2023    2:14 PM  Last 3 Weights  Weight (lbs) 140 lb 144 lb 148 lb 6 oz  Weight (kg) 63.504 kg 65.318 kg 67.302 kg      Telemetry  V pacing, PVCs, intermittent LOC - Personally Reviewed  ECG   No new EKGs - Personally Reviewed  Physical Exam  GEN: No acute distress.   Neck: No JVD R internal jugular site is stable Cardiac: RRR, no murmurs, rubs, or gallops.  Respiratory: Clear to auscultation bilaterally. GI: Soft, nontender, non-distended  MS: No edema; No deformity. Neuro:  Nonfocal  Psych: Normal affect   Labs High Sensitivity Troponin:   Recent Labs  Lab 04/04/24 1056 04/04/24 1247  TROPONINIHS 12 14     Chemistry Recent Labs  Lab 04/04/24 1056 04/04/24 1115 04/05/24 0342  NA 137 138 137  K 3.5 3.6 3.6  CL 102 103 104  CO2 23  --  24   GLUCOSE 106* 106* 118*  BUN 5* 4* <5*  CREATININE 1.05* 1.10* 0.98  CALCIUM  8.8*  --  9.0  GFRNONAA 54*  --  59*  ANIONGAP 12  --  9    Lipids No results for input(s): "CHOL", "TRIG", "HDL", "LABVLDL", "LDLCALC", "CHOLHDL" in the last 168 hours.  Hematology Recent Labs  Lab 04/04/24 1056 04/04/24 1115 04/05/24 0342  WBC 7.0  --  7.2  RBC 3.94  --  4.49  HGB 11.4* 11.9* 13.1  HCT 36.0 35.0* 39.6  MCV 91.4  --  88.2  MCH 28.9  --  29.2  MCHC 31.7  --  33.1  RDW 13.7  --  14.0  PLT 256  --  265   Thyroid  No results for input(s): "TSH", "FREET4" in the last 168 hours.  BNPNo results for input(s): "BNP", "PROBNP" in the last 168 hours.  DDimer No results for input(s): "DDIMER" in the last 168 hours.   Radiology  DG Chest Port 1 View Result Date: 04/04/2024 CLINICAL DATA:  Trans venous cardiac pacer placement EXAM: PORTABLE CHEST 1 VIEW COMPARISON:  04/04/2024 FINDINGS: Single frontal view of the chest demonstrates right  internal jugular transvenous pacer tip overlying the right ventricle. Cardiac silhouette is enlarged. No airspace disease, effusion, or pneumothorax. No acute bony abnormalities. IMPRESSION: 1. Right internal jugular transvenous pacer tip overlies right ventricle. 2. Stable enlarged cardiac silhouette. Electronically Signed   By: Bobbye Burrow M.D.   On: 04/04/2024 17:55   CARDIAC CATHETERIZATION Result Date: 04/04/2024 Successful temporary transvenous pacemaker placement through right internal jugular vein Pacing at 10 mA at 60 bpm Cody Das, MD  DG Chest Port 1 View Result Date: 04/04/2024 CLINICAL DATA:  Weakness. EXAM: PORTABLE CHEST 1 VIEW COMPARISON:  04/10/2023. FINDINGS: Defibrillator pad overlies the left chest wall. The heart size is mildly enlarged. Mediastinal contours are within normal limits. No focal consolidation, pleural effusion, or pneumothorax. No acute osseous abnormality. IMPRESSION: Mildly enlarged cardiac silhouette. Otherwise, no acute  cardiopulmonary findings. Electronically Signed   By: Mannie Seek M.D.   On: 04/04/2024 11:26    Cardiac Studies  ECHO 09/2022 > LVEF 55-60%, GI DD, mild calcification of AV / no stenosis   Patient Profile   78 y.o. female w/PMHx CAD, pectus excavatum, HTN, GERD, hiatal hernia, B12 def anemia SVT Long hx of variable AV block (previously declined pacing) Admitted with c/o weakness, dizzy spells >> CHB  Assessment & Plan   Variable AV block in cluding CHB rates dipping into 20's > BP stable though brought for emergent temp wire Over night with intermittent LOC on her temp wire, without prlonged/significant bradycardic rates Will bring her to the lab ASAP Temp wire outputs increased to 15  Dr. Marven Slimmer has been bedside Will go next case Pt remains agreeable  Stat echo d/w team  HTN No meds for now BP looks ok     For questions or updates, please contact [facility] Please consult [email or web address] for contact info under     Signed, Debbie Fails, PA-C  04/05/2024, 7:47 AM

## 2024-04-05 NOTE — Anesthesia Preprocedure Evaluation (Addendum)
 Anesthesia Evaluation  Patient identified by MRN, date of birth, ID band Patient awake    Reviewed: Allergy & Precautions, NPO status , Patient's Chart, lab work & pertinent test results  History of Anesthesia Complications Negative for: history of anesthetic complications  Airway Mallampati: II  TM Distance: >3 FB Neck ROM: Full    Dental no notable dental hx.    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  Reproductive/Obstetrics                             Anesthesia Physical Anesthesia Plan  ASA: 4  Anesthesia Plan: MAC   Post-op Pain Management: Minimal or no pain anticipated   Induction:   PONV Risk Score and Plan: 2 and Treatment may vary due to age or medical condition, Midazolam  and Propofol  infusion  Airway Management Planned: Natural Airway and Simple Face Mask  Additional Equipment: None  Intra-op Plan:   Post-operative Plan:   Informed Consent: I have reviewed the patients History and Physical, chart, labs and discussed the procedure including the risks, benefits and alternatives for the proposed anesthesia with the patient or authorized representative who has indicated his/her understanding and acceptance.       Plan Discussed with: CRNA  Anesthesia Plan  Comments:        Anesthesia Quick Evaluation

## 2024-04-05 NOTE — Discharge Instructions (Signed)
 After Your Pacemaker     ACTIVITY Do not lift your arm above shoulder height for 1 week after your procedure. After 7 days, you may progress as below.  You should remove your sling 24 hours after your procedure, unless otherwise instructed by your provider.     Friday April 12, 2024  Saturday April 13, 2024 Sunday April 14, 2024 Monday April 15, 2024   Do not lift, push, pull, or carry anything over 10 pounds with the affected arm until 6 weeks (Friday May 17, 2024 ) after your procedure.   You may drive AFTER your wound check, unless you have been told otherwise by your provider.   Ask your healthcare provider when you can go back to work   INCISION/Dressing If you are on a blood thinner such as Coumadin, Xarelto, Eliquis, Plavix , or Pradaxa please confirm with your provider when this should be resumed.   If large square, outer bandage is left in place, this can be removed after 24 hours from your procedure. Do not remove steri-strips or glue as below.   If a PRESSURE DRESSING (a bulky dressing that usually goes up over your shoulder) was applied or left in place, please follow instructions given by your provider on when to return to have this removed.   Monitor your Pacemaker site for redness, swelling, and drainage. Call the device clinic at [phone] if you experience these symptoms or fever/chills.  If your incision is sealed with Steri-strips or staples, you may shower 7 days after your procedure or when told by your provider. Do not remove the steri-strips or let the shower hit directly on your site. You may wash around your site with soap and water.    If you were discharged in a sling, please do not wear this during the day more than 48 hours after your surgery unless otherwise instructed. This may increase the risk of stiffness and soreness in your shoulder.   Avoid lotions, ointments, or perfumes over your incision until it is well-healed.  You may use a hot tub or a pool  AFTER your wound check appointment if the incision is completely closed.  Pacemaker Alerts:  Some alerts are vibratory and others beep. These are NOT emergencies. Please call our office to let us  know. If this occurs at night or on weekends, it can wait until the next business day. Send a remote transmission.  If your device is capable of reading fluid status (for heart failure), you will be offered monthly monitoring to review this with you.   DEVICE MANAGEMENT Remote monitoring is used to monitor your pacemaker from home. This monitoring is scheduled every 91 days by our office. It allows us  to keep an eye on the functioning of your device to ensure it is working properly. You will routinely see your Electrophysiologist annually (more often if necessary).   You should receive your ID card for your new device in 4-8 weeks. Keep this card with you at all times once received. Consider wearing a medical alert bracelet or necklace.  Your Pacemaker may be MRI compatible. This will be discussed at your next office visit/wound check.  You should avoid contact with strong electric or magnetic fields.   Do not use amateur (ham) radio equipment or electric (arc) welding torches. MP3 player headphones with magnets should not be used. Some devices are safe to use if held at least 12 inches (30 cm) from your Pacemaker. These include power tools, lawn mowers, and speakers. If  you are unsure if something is safe to use, ask your health care provider.  When using your cell phone, hold it to the ear that is on the opposite side from the Pacemaker. Do not leave your cell phone in a pocket over the Pacemaker.  You may safely use electric blankets, heating pads, computers, and microwave ovens.  Call the office right away if: You have chest pain. You feel more short of breath than you have felt before. You feel more light-headed than you have felt before. Your incision starts to open up.  This information is  not intended to replace advice given to you by your health care provider. Make sure you discuss any questions you have with your health care provider.

## 2024-04-05 NOTE — Transfer of Care (Signed)
 Immediate Anesthesia Transfer of Care Note  Patient: Kayla Terry  Procedure(s) Performed: PACEMAKER IMPLANT  Patient Location: ICU  Anesthesia Type:MAC  Level of Consciousness: awake and sedated  Airway & Oxygen Therapy: Patient Spontanous Breathing  Post-op Assessment: Report given to RN and Post -op Vital signs reviewed and stable  Post vital signs: Reviewed and stable  Last Vitals:  Vitals Value Taken Time  BP    Temp    Pulse    Resp    SpO2      Last Pain:  Vitals:   04/05/24 0800  TempSrc:   PainSc: 0-No pain      Patients Stated Pain Goal: 8 (04/04/24 2000)  Complications: There were no known notable events for this encounter.

## 2024-04-06 ENCOUNTER — Inpatient Hospital Stay (HOSPITAL_COMMUNITY)

## 2024-04-06 MED ORDER — OXYCODONE HCL 5 MG PO TABS: 5.0000 mg | ORAL_TABLET | Freq: Once | ORAL | Status: DC

## 2024-04-06 NOTE — Progress Notes (Signed)
   Rounding Note    Patient Name: Kayla Terry Date of Encounter: 04/06/2024  [facility] Cardiologist: Lauro Portal, MD   Subjective   No acute events overnight.  Feeling well this morning.  Minimal pain in the left shoulder.  Vital Signs    Vitals:   04/06/24 0500 04/06/24 0530 04/06/24 0600 04/06/24 0745  BP: (!) 146/82  (!) 144/80 (!) 146/75  Pulse: 60 60 (!) 58 76  Resp: (!) 9 14 15 14   Temp:  97.7 F (36.5 C)    TempSrc:  Oral    SpO2: 93% 94% 94% 97%  Weight:      Height:        Intake/Output Summary (Last 24 hours) at 04/06/2024 0802 Last data filed at 04/06/2024 0640 Gross per 24 hour  Intake 429.51 ml  Output 900 ml  Net -470.49 ml      04/04/2024   11:07 AM 12/27/2023    1:22 PM 12/04/2023    2:14 PM  Last 3 Weights  Weight (lbs) 140 lb 144 lb 148 lb 6 oz  Weight (kg) 63.504 kg 65.318 kg 67.302 kg      Telemetry    Ventricular paced- Personally Reviewed  ECG    Ventricular paced.  QRS duration 120 ms.- Personally Reviewed  Physical Exam   GEN: No acute distress.   Cardiac: RRR, no murmurs, rubs, or gallops.  Left prepectoral pocket healing well. Respiratory: Clear to auscultation bilaterally. Psych: Normal affect   Assessment & Plan    #Symptomatic bradycardia #Complete heart block Doing well after permanent pacemaker April 05, 2024.  Chest x-ray shows stable lead position.  EKG shows good pacing morphology.  Awaiting device interrogation this morning.  If device interrogation shows stable lead function, anticipate discharge later today.    Donelda Fujita T. Marven Slimmer, MD, [facility], [facility] Cardiac Electrophysiology
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
## 2024-04-08 ENCOUNTER — Encounter (HOSPITAL_COMMUNITY): Payer: Self-pay | Admitting: Cardiology

## 2024-04-18 ENCOUNTER — Ambulatory Visit: Attending: Cardiovascular Disease

## 2024-04-18 DIAGNOSIS — I442 Atrioventricular block, complete: Secondary | ICD-10-CM | POA: Diagnosis not present

## 2024-04-18 NOTE — Progress Notes (Signed)
 Normal dual chamber pacemaker wound check. Presenting rhythm: AS/VP- 74 . Wound well healed with stitch present to the lateral aspect of the incision site and this was clipped. Routine testing performed. Thresholds, sensing, and impedances consistent with implant measurements and at 3.5V safety margin/auto capture until 3 month visit. No episodes. Reviewed arm restrictions to continue for 6 weeks total post op.  Pt enrolled in remote follow-up.

## 2024-04-18 NOTE — Patient Instructions (Signed)
   After Your Pacemaker   Monitor your pacemaker site for redness, swelling, and drainage. Call the device clinic at [phone] if you experience these symptoms or fever/chills.  Your incision was closed with Steri-strips or staples:  You may shower 7 days after your procedure and wash your incision with soap and water. Avoid lotions, ointments, or perfumes over your incision until it is well-healed.  You may use a hot tub or a pool after your wound check appointment if the incision is completely closed.  Do not lift, push or pull greater than 10 pounds with the affected arm until 6 weeks after your procedure. UNTIL AFTER JULY 18TH. There are no other restrictions in arm movement after your wound check appointment.  You may drive, unless driving has been restricted by your healthcare providers.  Remote monitoring is used to monitor your pacemaker from home. This monitoring is scheduled every 91 days by our office. It allows us  to keep an eye on the functioning of your device to ensure it is working properly. You will routinely see your Electrophysiologist annually (more often if necessary).

## 2024-05-06 ENCOUNTER — Telehealth: Payer: Self-pay | Admitting: Internal Medicine

## 2024-05-06 NOTE — Telephone Encounter (Signed)
 Patient states that she can feel where the pacemaker what put in. She states that she doesn't feel well at all. Calling to see what she needs to do or what can be done. Please advise

## 2024-05-06 NOTE — Telephone Encounter (Signed)
 Transmission reviewed.  Device function WNL.  Spoke with Pt.  She states her incision site looks good.  No swelling or redness.  She has not had any fever or chills.  She states she could feel her pacemaker and was concerned that something was not working.  Reassured Pt that her device was functioning appropriately.  Provided education on her remote monitor.    Pt thanked nurse for call back.  She states the call was reassuring.

## 2024-05-06 NOTE — Telephone Encounter (Signed)
 I got the transmission from the pt 05/06/2024. I let her speak with Randall, rn.

## 2024-05-07 ENCOUNTER — Telehealth: Payer: Self-pay | Admitting: Cardiology

## 2024-05-07 NOTE — Telephone Encounter (Signed)
 Left message for Bernardino Level, NP to call back.

## 2024-05-07 NOTE — Telephone Encounter (Signed)
 [facility] called in asking if Dr. Cindie can call Bernardino Level, NP at his earliest convenience. She states pt had an appt today with them he had a few questions about pt's pacemaker.   Cell - [phone]

## 2024-05-07 NOTE — Telephone Encounter (Signed)
 Kayla Terry returning a call and states he would like a c/b tomorrow or leave a detailed message today.

## 2024-05-09 NOTE — Telephone Encounter (Signed)
Left message for Derrius to call back

## 2024-05-13 NOTE — Telephone Encounter (Signed)
 Returned [employer]. Was on hold. Person on hold stated they thought Waddell was at lunch. She will send message to Waddell that they can return call to Triage or DOD for a P2P.

## 2024-05-13 NOTE — Telephone Encounter (Signed)
 Pt c/o of Chest Pain: STAT if active (IN THIS MOMENT) CP, including tightness, pressure, jaw pain, shoulder/upper arm/back pain, SOB, nausea, and vomiting.  1. Are you having CP right now (tightness, pressure, or discomfort)? No   2. Are you experiencing any other symptoms (ex. SOB, nausea, vomiting, sweating)? No   3. How long have you been experiencing CP? Couple days last week   4. Is your CP continuous or coming and going? Coming and going   5. Have you taken Nitroglycerin ? No   6. If CP returns before callback, please consider calling 911. ?    Waddell with [facility] calling back about this note. She states Wolb[place], NP was concerned pt has been having some CP and had pacemaker put in recently. They want to know if Dr Cindie had any recommendations to help with this or gen card. Please advise.

## 2024-05-15 NOTE — Telephone Encounter (Signed)
 Called patient.  She thinks she is doing okay.  I don't even remember right now.  She was instructed to do a transmission and was told everything was okay.  No other needs at this time.  Will call if any concerns.

## 2024-05-22 ENCOUNTER — Ambulatory Visit

## 2024-05-22 DIAGNOSIS — I442 Atrioventricular block, complete: Secondary | ICD-10-CM

## 2024-05-23 ENCOUNTER — Ambulatory Visit: Payer: Self-pay | Admitting: Cardiology

## 2024-05-23 LAB — CUP PACEART REMOTE DEVICE CHECK
Battery Remaining Longevity: 155 mo
Battery Voltage: 3.22 V
Brady Statistic AP VP Percent: 42.68 %
Brady Statistic AP VS Percent: 0 %
Brady Statistic AS VP Percent: 57.3 %
Brady Statistic AS VS Percent: 0.02 %
Brady Statistic RA Percent Paced: 42.66 %
Brady Statistic RV Percent Paced: 99.98 %
Date Time Interrogation Session: 20250722232559
Implantable Lead Connection Status: 753985
Implantable Lead Connection Status: 753985
Implantable Lead Implant Date: 20250606
Implantable Lead Implant Date: 20250606
Implantable Lead Location: 753859
Implantable Lead Location: 753860
Implantable Lead Model: 3830
Implantable Lead Model: 5076
Implantable Pulse Generator Implant Date: 20250606
Lead Channel Impedance Value: 323 Ohm
Lead Channel Impedance Value: 456 Ohm
Lead Channel Impedance Value: 570 Ohm
Lead Channel Impedance Value: 646 Ohm
Lead Channel Pacing Threshold Amplitude: 0.625 V
Lead Channel Pacing Threshold Amplitude: 0.75 V
Lead Channel Pacing Threshold Pulse Width: 0.4 ms
Lead Channel Pacing Threshold Pulse Width: 0.4 ms
Lead Channel Sensing Intrinsic Amplitude: 17.125 mV
Lead Channel Sensing Intrinsic Amplitude: 17.125 mV
Lead Channel Sensing Intrinsic Amplitude: 3.375 mV
Lead Channel Sensing Intrinsic Amplitude: 3.375 mV
Lead Channel Setting Pacing Amplitude: 1.5 V
Lead Channel Setting Pacing Amplitude: 2 V
Lead Channel Setting Pacing Pulse Width: 0.4 ms
Lead Channel Setting Sensing Sensitivity: 1.2 mV
Zone Setting Status: 755011

## 2024-05-29 ENCOUNTER — Telehealth: Payer: Self-pay | Admitting: Neurology

## 2024-05-29 NOTE — Telephone Encounter (Signed)
 Request for a f/u with Dr Ines

## 2024-07-11 ENCOUNTER — Ambulatory Visit: Admitting: Cardiology

## 2024-07-15 ENCOUNTER — Encounter: Payer: Self-pay | Admitting: Student

## 2024-07-15 ENCOUNTER — Ambulatory Visit: Attending: Student | Admitting: Student

## 2024-07-15 VITALS — BP 118/66 | HR 72 | Ht 63.0 in

## 2024-07-15 DIAGNOSIS — I442 Atrioventricular block, complete: Secondary | ICD-10-CM | POA: Diagnosis not present

## 2024-07-15 LAB — CUP PACEART INCLINIC DEVICE CHECK
Battery Remaining Longevity: 152 mo
Battery Voltage: 3.21 V
Brady Statistic AP VP Percent: 43.88 %
Brady Statistic AP VS Percent: 0 %
Brady Statistic AS VP Percent: 56.08 %
Brady Statistic AS VS Percent: 0.04 %
Brady Statistic RA Percent Paced: 43.86 %
Brady Statistic RV Percent Paced: 99.96 %
Date Time Interrogation Session: 20250915125031
Implantable Lead Connection Status: 753985
Implantable Lead Connection Status: 753985
Implantable Lead Implant Date: 20250606
Implantable Lead Implant Date: 20250606
Implantable Lead Location: 753859
Implantable Lead Location: 753860
Implantable Lead Model: 3830
Implantable Lead Model: 5076
Implantable Pulse Generator Implant Date: 20250606
Lead Channel Impedance Value: 361 Ohm
Lead Channel Impedance Value: 456 Ohm
Lead Channel Impedance Value: 475 Ohm
Lead Channel Impedance Value: 646 Ohm
Lead Channel Pacing Threshold Amplitude: 0.5 V
Lead Channel Pacing Threshold Amplitude: 0.5 V
Lead Channel Pacing Threshold Pulse Width: 0.4 ms
Lead Channel Pacing Threshold Pulse Width: 0.4 ms
Lead Channel Sensing Intrinsic Amplitude: 15.125 mV
Lead Channel Sensing Intrinsic Amplitude: 15.125 mV
Lead Channel Sensing Intrinsic Amplitude: 3 mV
Lead Channel Sensing Intrinsic Amplitude: 3.375 mV
Lead Channel Setting Pacing Amplitude: 1.5 V
Lead Channel Setting Pacing Amplitude: 2 V
Lead Channel Setting Pacing Pulse Width: 0.4 ms
Lead Channel Setting Sensing Sensitivity: 1.2 mV
Zone Setting Status: 755011

## 2024-07-15 NOTE — Progress Notes (Signed)
  Electrophysiology Office Note:   ID:  Kayla Terry, DOB 26-May-1946, MRN [id number]  Primary Cardiologist: Dorn Lesches, MD Electrophysiologist: OLE ONEIDA HOLTS, MD      History of Present Illness:   Kayla Terry is a 78 y.o. female with h/o  Mobitz II AVB, SVT, CAD, pectus excavatum with mass effect on the RV by CT 2021, HTN, GERD, hiatal hernia, B12 anemia, and pulmonary nodule  seen today for routine electrophysiology followup.   Since last being seen in our clinic the patient reports doing OK. She has had some fatigue, that has not improved despite pacing. Otherwise, she denies chest pain, palpitations, dyspnea, PND, orthopnea, nausea, vomiting, dizziness, syncope, edema, weight gain, or early satiety.   Review of systems complete and found to be negative unless listed in HPI.   EP Information / Studies Reviewed:    EKG is ordered today. Personal review as below.  EKG Interpretation Date/Time:  Monday July 15 2024 12:09:26 EDT Ventricular Rate:  72 PR Interval:  220 QRS Duration:  140 QT Interval:  420 QTC Calculation: 459 R Axis:   -76  Text Interpretation: Atrial-sensed ventricular-paced rhythm with prolonged AV conduction When compared with ECG of 06-Apr-2024 07:16, No significant change was found Confirmed by Lesia Sharper [phone]) on 07/15/2024 12:14:10 PM    PPM Interrogation-  reviewed in detail today,  See PACEART report.  Arrhythmia/Device History CARELINK PPM MEDTRONIC-RA, RV-DOI-04/05/2024-CL   Physical Exam:   VS:  BP 118/66   Pulse 72   Ht 5' 3 (1.6 m)   SpO2 96%   BMI 24.80 kg/m    Wt Readings from Last 3 Encounters:  04/04/24 140 lb (63.5 kg)  12/27/23 144 lb (65.3 kg)  12/04/23 148 lb 6 oz (67.3 kg)     GEN: No acute distress  NECK: No JVD; No carotid bruits CARDIAC: Regular rate and rhythm, no murmurs, rubs, gallops RESPIRATORY:  Clear to auscultation without rales, wheezing or rhonchi  ABDOMEN: Soft, non-tender,  non-distended EXTREMITIES:  No edema; No deformity   ASSESSMENT AND PLAN:    Second Degree AV block s/p Medtronic PPM  Normal PPM function See Pace Art report No changes today  Fatigue Multifactorial Not very active. Histograms with good excursion. Encouraged increased activity and consider requesting PT referral from PCP if not improved.    Disposition:   Follow up with EP Team in 12 months  Signed, Sharper Prentice Lesia, PA-C

## 2024-07-15 NOTE — Patient Instructions (Signed)
 Medication Instructions:  Your physician recommends that you continue on your current medications as directed. Please refer to the Current Medication list given to you today.  *If you need a refill on your cardiac medications before your next appointment, please call your pharmacy*  Lab Work: None ordered If you have labs (blood work) drawn today and your tests are completely normal, you will receive your results only by: MyChart Message (if you have MyChart) OR A paper copy in the mail If you have any lab test that is abnormal or we need to change your treatment, we will call you to review the results.  Follow-Up: At [facility], you and your health needs are our priority.  As part of our continuing mission to provide you with exceptional heart care, our providers are all part of one team.  This team includes your primary Cardiologist (physician) and Advanced Practice Providers or APPs (Physician Assistants and Nurse Practitioners) who all work together to provide you with the care you need, when you need it.  Your next appointment:   1 year(s)  Provider:   You may see OLE ONEIDA HOLTS, MD or one of the following Advanced Practice Providers on your designated Care Team:   Charlies Arthur, [place] Ozell Jodie Passey, PA-C Suzann Riddle, NP Daphne Barrack, NP Artist Pouch, PA-C    We recommend signing up for the patient portal called MyChart.  Sign up information is provided on this After Visit Summary.  MyChart is used to connect with patients for Virtual Visits (Telemedicine).  Patients are able to view lab/test results, encounter notes, upcoming appointments, etc.  Non-urgent messages can be sent to your provider as well.   To learn more about what you can do with MyChart, go to [email or web address].

## 2024-07-18 ENCOUNTER — Ambulatory Visit: Payer: Self-pay | Admitting: Cardiology

## 2024-07-28 ENCOUNTER — Ambulatory Visit
Admission: EM | Admit: 2024-07-28 | Discharge: 2024-07-28 | Disposition: A | Discharge status: Home / Self Care | Attending: Internal Medicine | Admitting: Internal Medicine

## 2024-07-28 DIAGNOSIS — B9689 Other specified bacterial agents as the cause of diseases classified elsewhere: Secondary | ICD-10-CM | POA: Insufficient documentation

## 2024-07-28 DIAGNOSIS — N949 Unspecified condition associated with female genital organs and menstrual cycle: Secondary | ICD-10-CM | POA: Diagnosis present

## 2024-07-28 DIAGNOSIS — N342 Other urethritis: Secondary | ICD-10-CM | POA: Insufficient documentation

## 2024-07-28 LAB — POCT URINE DIPSTICK
Bilirubin, UA: NEGATIVE
Glucose, UA: NEGATIVE mg/dL
Ketones, POC UA: NEGATIVE mg/dL
Leukocytes, UA: NEGATIVE
Nitrite, UA: NEGATIVE
Protein Ur, POC: NEGATIVE mg/dL
Spec Grav, UA: 1.02 (ref 1.010–1.025)
Urobilinogen, UA: 0.2 U/dL
pH, UA: 6 (ref 5.0–8.0)

## 2024-07-28 MED ORDER — CEPHALEXIN 500 MG PO CAPS: 500.0000 mg | ORAL_CAPSULE | Freq: Two times a day (BID) | ORAL | 0 refills | Status: AC

## 2024-07-28 NOTE — ED Provider Notes (Signed)
 [facility]    CSN: [id number] Arrival date & time: 07/28/24  0931      History   Chief Complaint Chief Complaint  Patient presents with   vaginal problem    HPI Kayla Terry is a 78 y.o. female.   Kayla Terry is a 78 y.o. female presenting for chief complaint of vaginal discomfort and strong odor to urine starting 3 weeks ago.  Vaginal discomfort comes and goes.  She is not currently sexually active and denies vaginal discharge, vaginal odor.  Reports vaginal irritation without true vaginal itching.  Reports urinary frequency without dysuria, urinary urgency, urinary hesitancy, bladder incontinence, and gross hematuria.  She is experiencing bilateral lower abdominal discomfort and bilateral lower back discomfort.  Denies recent falls, nausea, vomiting, diarrhea, flank pain, dizziness, and fever/chills.  Denies recent antibiotic or steroid use in the last 90 days.  She is not a diabetic.  She has not attempted use of any over-the-counter medications to help with symptoms PTA.     Past Medical History:  Diagnosis Date   Allergy    B12 deficiency 04/2020   Cataract    Coronary artery disease    COVID-19 04/22/2021   Diverticulitis    10/19/2022   Fatty liver    GERD (gastroesophageal reflux disease)    Herpes zoster without complications    Hiatal hernia    Hypertension    Mild mitral regurgitation 2013   Mobitz type 2 second degree atrioventricular block    Pectus excavatum    pectus excavatum deformity resulting in mass effect on the RV by CT 03/2020   Pulmonary nodule    Shingles    Stroke (HCC)    10/19/2022    Patient Active Problem List   Diagnosis Date Noted   Second degree AV block, Mobitz type II 04/04/2024   Pain of left heel 02/06/2024   Spinal stenosis of lumbar region 08/15/2023   Benign neoplasm of colon 01/17/2023   Acute stroke due to ischemia (HCC) 10/21/2022   Diverticulitis of colon 10/19/2022   Leukocytosis 10/19/2022    Abdominal pain 08/02/2022   Hypertensive urgency 08/01/2022   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/06/2022   COVID-19 virus infection 04/23/2021   Coronary artery disease 04/23/2021   Chest pain 04/22/2021   Chest pain of uncertain etiology    Nonspecific abnormal electrocardiogram (ECG) (EKG)    2nd degree AV block 03/25/2021   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   B12 deficiency 05/07/2020   Muscle weakness 05/06/2020   Benign essential hypertension 06/08/2017   Acid reflux disease 03/08/2016   Hyperlipidemia 03/08/2016   Nocturia 03/08/2016   Restless leg syndrome 03/08/2016   Chest tightness 08/21/2012    Past Surgical History:  Procedure Laterality Date   ABDOMINAL HYSTERECTOMY  02/1984   BACK SURGERY     CATARACT EXTRACTION, BILATERAL     COLONOSCOPY WITH PROPOFOL  N/A 01/17/2023   Procedure: COLONOSCOPY WITH PROPOFOL ;  Surgeon: Leigh Elspeth SQUIBB, MD;  Location: [facility];  Service: Gastroenterology;  Laterality: N/A;   LEFT HEART CATH AND CORONARY ANGIOGRAPHY N/A 04/22/2021   Procedure: LEFT HEART CATH AND CORONARY ANGIOGRAPHY;  Surgeon: Verlin Lonni BIRCH, MD;  Location: [facility];  Service: Cardiovascular;  Laterality: N/A;   LUMBAR LAMINECTOMY     OOPHORECTOMY  02/29/1984   unilateral   PACEMAKER IMPLANT N/A 04/05/2024   Procedure: PACEMAKER IMPLANT;  Surgeon: Cindie Ole DASEN, MD;  Location: [facility];  Service:  Cardiovascular;  Laterality: N/A;   POLYPECTOMY  01/17/2023   Procedure: POLYPECTOMY;  Surgeon: Leigh Elspeth SQUIBB, MD;  Location: [facility];  Service: Gastroenterology;;   TEMPORARY PACEMAKER N/A 04/04/2024   Procedure: TEMPORARY PACEMAKER;  Surgeon: Elmira Newman PARAS, MD;  Location: [facility];  Service: Cardiovascular;  Laterality: N/A;    OB History   No obstetric history on file.      Home Medications    Prior to Admission medications   Medication Sig Start  Date End Date Taking? Authorizing Provider  cephALEXin  (KEFLEX ) 500 MG capsule Take 1 capsule (500 mg total) by mouth 2 (two) times daily for 7 days. 07/28/24 08/04/24 Yes StanhopeDorna HERO, FNP  amLODipine  (NORVASC ) 5 MG tablet Take 5 mg by mouth daily as needed (High BP).    [provider]  aspirin  EC 81 MG tablet Take 1 tablet (81 mg total) by mouth daily. Swallow whole. 10/22/22   Johnson, Clanford L, MD  famotidine  (PEPCID ) 20 MG tablet Take 20 mg by mouth daily as needed for heartburn or indigestion.    [provider]  gabapentin  (NEURONTIN ) 300 MG capsule Take 1-2 capsules (300-600 mg total) by mouth 3 (three) times daily. Patient taking differently: Take 300-600 mg by mouth 3 (three) times daily. As needed 06/14/23   [name], MD  HYDROcodone -acetaminophen  (NORCO/VICODIN) 5-325 MG tablet Take 1 tablet by mouth 2 (two) times daily as needed for severe pain (pain score 7-10). 03/21/24   [provider]  losartan  (COZAAR ) 50 MG tablet Take 1 tablet (50 mg total) by mouth daily. Patient taking differently: Take 50 mg by mouth daily as needed (High BP). 10/22/22   Johnson, Clanford L, MD  Peppermint Oil (IBGARD) 90 MG CPCR Take 90 mg by mouth daily as needed (IBS).    [provider]  Propylene Glycol (SYSTANE BALANCE) 0.6 % SOLN Place 1 drop into both eyes daily as needed (dry eye).    [provider]  traZODone  (DESYREL ) 100 MG tablet Take 100 mg by mouth at bedtime. 03/02/22   [provider]  zolpidem  (AMBIEN ) 10 MG tablet Take 10 mg by mouth at bedtime.    [provider]    Family History Family History  Problem Relation Age of Onset   Cancer Mother    Leukemia Mother    Lung cancer Father    Multiple myeloma Father    Multiple sclerosis Sister    Alcohol  abuse Brother    Multiple myeloma Brother    Colon cancer Neg Hx    Colon polyps Neg Hx    Stomach cancer Neg Hx    Esophageal cancer Neg Hx    Neuropathy  Neg Hx    Stroke Neg Hx     Social History Social History   Tobacco Use   Smoking status: Never    Passive exposure: Never   Smokeless tobacco: Never  Vaping Use   Vaping status: Never Used  Substance Use Topics   Alcohol  use: No   Drug use: No     Allergies   Fluvoxamine, Lexapro [escitalopram], Naproxen sodium, and Protonix  [pantoprazole ]   Review of Systems Review of Systems Per HPI  Physical Exam Triage Vital Signs ED Triage Vitals  Encounter Vitals Group     BP 07/28/24 0938 139/81     Girls Systolic BP Percentile --      Girls Diastolic BP Percentile --      Boys Systolic BP Percentile --  Boys Diastolic BP Percentile --      Pulse Rate 07/28/24 0938 98     Resp 07/28/24 0938 18     Temp 07/28/24 0938 97.6 F (36.4 C)     Temp Source 07/28/24 0938 Oral     SpO2 07/28/24 0938 95 %     Weight --      Height --      Head Circumference --      Peak Flow --      Pain Score 07/28/24 0942 5     Pain Loc --      Pain Education --      Exclude from Growth Chart --    No data found.  Updated Vital Signs BP 139/81 (BP Location: Right Arm)   Pulse 98   Temp 97.6 F (36.4 C) (Oral)   Resp 18   SpO2 95%   Visual Acuity Right Eye Distance:   Left Eye Distance:   Bilateral Distance:    Right Eye Near:   Left Eye Near:    Bilateral Near:     Physical Exam Vitals and nursing note reviewed. Exam conducted with a chaperone present Evonne CMA present for GU exam.).  Constitutional:      Appearance: She is not ill-appearing or toxic-appearing.  HENT:     Head: Normocephalic and atraumatic.     Right Ear: Hearing and external ear normal.     Left Ear: Hearing and external ear normal.     Nose: Nose normal.     Mouth/Throat:     Lips: Pink.  Eyes:     General: Lids are normal. Vision grossly intact. Gaze aligned appropriately.     Extraocular Movements: Extraocular movements intact.     Conjunctiva/sclera: Conjunctivae normal.  Pulmonary:      Effort: Pulmonary effort is normal.  Abdominal:     General: Bowel sounds are normal.     Palpations: Abdomen is soft.     Tenderness: There is no abdominal tenderness. There is no right CVA tenderness, left CVA tenderness or guarding.  Genitourinary:    Labia:        Right: No rash, tenderness, lesion or injury.        Left: No rash, tenderness, lesion or injury.      Urethra: Urethral pain and urethral swelling (Urethra appears erythematous and mildly swollen.) present. No urethral lesion.     Comments: Scant thin white vaginal discharge to the vaginal introitus.  Minimal erythema to the bilateral labia minora.  Nontender on exam.  No rashes or signs of skin excoriation.  Normal hair growth pattern.  No signs of bacterial folliculitis. Musculoskeletal:     Cervical back: Normal and neck supple.     Thoracic back: Normal.     Lumbar back: Normal.  Skin:    General: Skin is warm and dry.     Capillary Refill: Capillary refill takes less than 2 seconds.     Findings: No rash.  Neurological:     General: No focal deficit present.     Mental Status: She is alert and oriented to person, place, and time. Mental status is at baseline.     Cranial Nerves: No dysarthria or facial asymmetry.  Psychiatric:        Mood and Affect: Mood normal.        Speech: Speech normal.        Behavior: Behavior normal.        Thought Content: Thought content normal.  Judgment: Judgment normal.      UC Treatments / Results  Labs (all labs ordered are listed, but only abnormal results are displayed) Labs Reviewed  POCT URINE DIPSTICK - Abnormal; Notable for the following components:      Result Value   Color, UA light yellow (*)    Blood, UA moderate (*)    All other components within normal limits  URINE CULTURE  CERVICOVAGINAL ANCILLARY ONLY    EKG   Radiology No results found.  Procedures Procedures (including critical care time)  Medications Ordered in UC Medications - No data  to display  Initial Impression / Assessment and Plan / UC Course  I have reviewed the triage vital signs and the nursing notes.  Pertinent labs & imaging results that were available during my care of the patient were reviewed by me and considered in my medical decision making (see chart for details).   1.  Bacterial urethritis, vaginal discomfort Presentation is consistent with likely bacterial urethritis.  Urethra appears swollen and red.  Urinalysis shows hematuria without leukocytes/nitrites.  Urine culture is pending. I would like to go ahead and treat for urethritis with Keflex  antibiotic twice daily for the next 7 days. Vaginal swab is pending to test for BV and yeast.  Staff will treat per protocol once swab results. Recommend follow-up with PCP in the next 3 to 4 days to discuss symptoms further as she may benefit from a urogynecology referral if symptoms persist.  She may use Tylenol  as needed for pain. Advised to avoid urinary irritants and increase water intake to maintain hydration.   Counseled patient on potential for adverse effects with medications prescribed/recommended today, strict ER and return-to-clinic precautions discussed, patient verbalized understanding.    Final Clinical Impressions(s) / UC Diagnoses   Final diagnoses:  Bacterial urethritis  Vaginal discomfort     Discharge Instructions      I suspect your pain is coming from inflammation of your urine tube called your urethra.   We are going to go ahead and treat you for a urinary tract infection with Keflex  antibiotic every 12 hours for the next 7 days.  Urine culture is pending, staff will call if your urine grows any bacteria that is not treated by the antibiotic we have chosen today.  Vaginal swab will come back in the next 2 to 3 days, staff will treat per protocol if you test positive for any infections on the vaginal swab.  Please schedule a follow-up appointment with your primary care provider  to discuss symptoms further and make sure that they get better after taking antibiotics.  If you develop any new or worsening symptoms or if your symptoms do not start to improve, please return here or follow-up with your primary care provider. If your symptoms are severe, please go to the emergency room.    ED Prescriptions     Medication Sig Dispense Auth. Provider   cephALEXin  (KEFLEX ) 500 MG capsule Take 1 capsule (500 mg total) by mouth 2 (two) times daily for 7 days. 14 capsule Enedelia Dorna HERO, FNP      PDMP not reviewed this encounter.   Enedelia Dorna HERO, [place] 07/28/24 1030

## 2024-07-28 NOTE — ED Triage Notes (Signed)
 Pt reports she has low abdominal pain, external vagina soreness, foul smelling urine x 3 weeks,   States she only uses mild soap

## 2024-07-28 NOTE — Discharge Instructions (Signed)
 I suspect your pain is coming from inflammation of your urine tube called your urethra.   We are going to go ahead and treat you for a urinary tract infection with Keflex  antibiotic every 12 hours for the next 7 days.  Urine culture is pending, staff will call if your urine grows any bacteria that is not treated by the antibiotic we have chosen today.  Vaginal swab will come back in the next 2 to 3 days, staff will treat per protocol if you test positive for any infections on the vaginal swab.  Please schedule a follow-up appointment with your primary care provider to discuss symptoms further and make sure that they get better after taking antibiotics.  If you develop any new or worsening symptoms or if your symptoms do not start to improve, please return here or follow-up with your primary care provider. If your symptoms are severe, please go to the emergency room.

## 2024-07-29 ENCOUNTER — Ambulatory Visit (HOSPITAL_COMMUNITY): Payer: Self-pay

## 2024-07-29 LAB — CERVICOVAGINAL ANCILLARY ONLY
Bacterial Vaginitis (gardnerella): NEGATIVE
Candida Glabrata: NEGATIVE
Candida Vaginitis: NEGATIVE
Comment: NEGATIVE
Comment: NEGATIVE
Comment: NEGATIVE

## 2024-07-29 LAB — URINE CULTURE: Culture: NO GROWTH

## 2024-08-02 NOTE — Progress Notes (Signed)
 Remote PPM Transmission

## 2024-08-21 ENCOUNTER — Ambulatory Visit

## 2024-08-21 DIAGNOSIS — I442 Atrioventricular block, complete: Secondary | ICD-10-CM

## 2024-08-22 LAB — CUP PACEART REMOTE DEVICE CHECK
Battery Remaining Longevity: 151 mo
Battery Voltage: 3.2 V
Brady Statistic AP VP Percent: 42.26 %
Brady Statistic AP VS Percent: 0 %
Brady Statistic AS VP Percent: 57.73 %
Brady Statistic AS VS Percent: 0.01 %
Brady Statistic RA Percent Paced: 42.23 %
Brady Statistic RV Percent Paced: 99.99 %
Date Time Interrogation Session: 20251022000731
Implantable Lead Connection Status: 753985
Implantable Lead Connection Status: 753985
Implantable Lead Implant Date: 20250606
Implantable Lead Implant Date: 20250606
Implantable Lead Location: 753859
Implantable Lead Location: 753860
Implantable Lead Model: 3830
Implantable Lead Model: 5076
Implantable Pulse Generator Implant Date: 20250606
Lead Channel Impedance Value: 380 Ohm
Lead Channel Impedance Value: 456 Ohm
Lead Channel Impedance Value: 494 Ohm
Lead Channel Impedance Value: 646 Ohm
Lead Channel Pacing Threshold Amplitude: 0.5 V
Lead Channel Pacing Threshold Amplitude: 0.625 V
Lead Channel Pacing Threshold Pulse Width: 0.4 ms
Lead Channel Pacing Threshold Pulse Width: 0.4 ms
Lead Channel Sensing Intrinsic Amplitude: 15.125 mV
Lead Channel Sensing Intrinsic Amplitude: 15.125 mV
Lead Channel Sensing Intrinsic Amplitude: 3.25 mV
Lead Channel Sensing Intrinsic Amplitude: 3.25 mV
Lead Channel Setting Pacing Amplitude: 1.5 V
Lead Channel Setting Pacing Amplitude: 2 V
Lead Channel Setting Pacing Pulse Width: 0.4 ms
Lead Channel Setting Sensing Sensitivity: 1.2 mV
Zone Setting Status: 755011

## 2024-08-23 NOTE — Progress Notes (Signed)
 Remote PPM Transmission

## 2024-08-26 ENCOUNTER — Ambulatory Visit: Payer: Self-pay | Admitting: Cardiology

## 2024-09-23 ENCOUNTER — Ambulatory Visit
Admission: EM | Admit: 2024-09-23 | Discharge: 2024-09-23 | Disposition: A | Discharge status: Home / Self Care | Attending: Family Medicine | Admitting: Family Medicine

## 2024-09-23 DIAGNOSIS — N9089 Other specified noninflammatory disorders of vulva and perineum: Secondary | ICD-10-CM | POA: Diagnosis not present

## 2024-09-23 DIAGNOSIS — R030 Elevated blood-pressure reading, without diagnosis of hypertension: Secondary | ICD-10-CM

## 2024-09-23 LAB — POCT URINE DIPSTICK
Bilirubin, UA: NEGATIVE
Blood, UA: NEGATIVE
Glucose, UA: NEGATIVE mg/dL
Ketones, POC UA: NEGATIVE mg/dL
Leukocytes, UA: NEGATIVE
Nitrite, UA: NEGATIVE
POC PROTEIN,UA: NEGATIVE
Spec Grav, UA: 1.01 (ref 1.010–1.025)
Urobilinogen, UA: 0.2 U/dL
pH, UA: 5.5 (ref 5.0–8.0)

## 2024-09-23 MED ORDER — NYSTATIN 100000 UNIT/GM EX CREA: TOPICAL_CREAM | CUTANEOUS | 0 refills | Status: DC

## 2024-09-23 NOTE — Discharge Instructions (Addendum)
 As we discussed, we will try the nystatin  cream to see if this calms your irritation though I am not convinced given your negative vaginal swab last visit and physical exam this visit that the irritation is representative of a vaginal infection.  I do recommend following up with a gynecologist for further evaluation if persisting as we reviewed as there are several other potential causes of your irritation.  In the meantime, avoid any scented soaps, feminine wipes or washes or anything else that could potentially be irritating to this sensitive area

## 2024-09-23 NOTE — ED Provider Notes (Addendum)
 [facility]    CSN: [id number] Arrival date & time: 09/23/24  1541      History   Chief Complaint No chief complaint on file.   HPI Kayla Terry is a 78 y.o. female.   Patient presenting today with several week history but worsening the last 3 days of external vaginal irritation and burning sensation.  Denies new soaps or products tried, vaginal discharge, dysuria, hematuria, pelvic or abdominal pain, nausea vomiting or diarrhea.  Was seen for the same about 2 months ago, had negative testing for UTI and vaginitis but was treated with Keflex  and she states she did get a bit of relief with this the symptoms came right back.  States she has not been sexually active.  Not trying anything currently for symptoms.    Past Medical History:  Diagnosis Date   Allergy    B12 deficiency 04/2020   Cataract    Coronary artery disease    COVID-19 04/22/2021   Diverticulitis    10/19/2022   Fatty liver    GERD (gastroesophageal reflux disease)    Herpes zoster without complications    Hiatal hernia    Hypertension    Mild mitral regurgitation 2013   Mobitz type 2 second degree atrioventricular block    Pectus excavatum    pectus excavatum deformity resulting in mass effect on the RV by CT 03/2020   Pulmonary nodule    Shingles    Stroke (HCC)    10/19/2022    Patient Active Problem List   Diagnosis Date Noted   Second degree AV block, Mobitz type II 04/04/2024   Pain of left heel 02/06/2024   Spinal stenosis of lumbar region 08/15/2023   Benign neoplasm of colon 01/17/2023   Acute stroke due to ischemia (HCC) 10/21/2022   Diverticulitis of colon 10/19/2022   Leukocytosis 10/19/2022   Abdominal pain 08/02/2022   Hypertensive urgency 08/01/2022   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/06/2022   COVID-19 virus infection 04/23/2021   Coronary artery disease 04/23/2021   Chest pain 04/22/2021   Chest pain of uncertain etiology     Nonspecific abnormal electrocardiogram (ECG) (EKG)    2nd degree AV block 03/25/2021   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   B12 deficiency 05/07/2020   Muscle weakness 05/06/2020   Benign essential hypertension 06/08/2017   Acid reflux disease 03/08/2016   Hyperlipidemia 03/08/2016   Nocturia 03/08/2016   Restless leg syndrome 03/08/2016   Chest tightness 08/21/2012    Past Surgical History:  Procedure Laterality Date   ABDOMINAL HYSTERECTOMY  02/1984   BACK SURGERY     CATARACT EXTRACTION, BILATERAL     COLONOSCOPY WITH PROPOFOL  N/A 01/17/2023   Procedure: COLONOSCOPY WITH PROPOFOL ;  Surgeon: Leigh Elspeth SQUIBB, MD;  Location: [facility];  Service: Gastroenterology;  Laterality: N/A;   LEFT HEART CATH AND CORONARY ANGIOGRAPHY N/A 04/22/2021   Procedure: LEFT HEART CATH AND CORONARY ANGIOGRAPHY;  Surgeon: Verlin Lonni BIRCH, MD;  Location: [facility];  Service: Cardiovascular;  Laterality: N/A;   LUMBAR LAMINECTOMY     OOPHORECTOMY  02/29/1984   unilateral   PACEMAKER IMPLANT N/A 04/05/2024   Procedure: PACEMAKER IMPLANT;  Surgeon: Cindie Ole DASEN, MD;  Location: [facility];  Service: Cardiovascular;  Laterality: N/A;   POLYPECTOMY  01/17/2023   Procedure: POLYPECTOMY;  Surgeon: Leigh Elspeth SQUIBB, MD;  Location: [facility];  Service: Gastroenterology;;   TEMPORARY PACEMAKER N/A 04/04/2024   Procedure: TEMPORARY PACEMAKER;  Surgeon: Elmira Newman PARAS, MD;  Location: [facility];  Service: Cardiovascular;  Laterality: N/A;    OB History   No obstetric history on file.      Home Medications    Prior to Admission medications   Medication Sig Start Date End Date Taking? Authorizing Provider  nystatin  cream (MYCOSTATIN ) Apply to affected area 2 times daily as needed 09/23/24  Yes [name], PA-C  amLODipine  (NORVASC ) 5 MG tablet Take 5 mg by mouth daily as needed (High BP).    [provider]  aspirin  EC  81 MG tablet Take 1 tablet (81 mg total) by mouth daily. Swallow whole. 10/22/22   Johnson, Clanford L, MD  famotidine  (PEPCID ) 20 MG tablet Take 20 mg by mouth daily as needed for heartburn or indigestion.    [provider]  gabapentin  (NEURONTIN ) 300 MG capsule Take 1-2 capsules (300-600 mg total) by mouth 3 (three) times daily. Patient taking differently: Take 300-600 mg by mouth 3 (three) times daily. As needed 06/14/23   [name], MD  HYDROcodone -acetaminophen  (NORCO/VICODIN) 5-325 MG tablet Take 1 tablet by mouth 2 (two) times daily as needed for severe pain (pain score 7-10). 03/21/24   [provider]  losartan  (COZAAR ) 50 MG tablet Take 1 tablet (50 mg total) by mouth daily. Patient taking differently: Take 50 mg by mouth daily as needed (High BP). 10/22/22   Johnson, Clanford L, MD  Peppermint Oil (IBGARD) 90 MG CPCR Take 90 mg by mouth daily as needed (IBS).    [provider]  Propylene Glycol (SYSTANE BALANCE) 0.6 % SOLN Place 1 drop into both eyes daily as needed (dry eye).    [provider]  traZODone  (DESYREL ) 100 MG tablet Take 100 mg by mouth at bedtime. 03/02/22   [provider]  zolpidem  (AMBIEN ) 10 MG tablet Take 10 mg by mouth at bedtime.    [provider]    Family History Family History  Problem Relation Age of Onset   Cancer Mother    Leukemia Mother    Lung cancer Father    Multiple myeloma Father    Multiple sclerosis Sister    Alcohol  abuse Brother    Multiple myeloma Brother    Colon cancer Neg Hx    Colon polyps Neg Hx    Stomach cancer Neg Hx    Esophageal cancer Neg Hx    Neuropathy Neg Hx    Stroke Neg Hx     Social History Social History   Tobacco Use   Smoking status: Never    Passive exposure: Never   Smokeless tobacco: Never  Vaping Use   Vaping status: Never Used  Substance Use Topics   Alcohol  use: No   Drug use: No     Allergies   Fluvoxamine, Lexapro [escitalopram],  Naproxen sodium, and Protonix  [pantoprazole ]   Review of Systems Review of Systems Per HPI  Physical Exam Triage Vital Signs ED Triage Vitals  Encounter Vitals Group     BP 09/23/24 1548 (!) 173/97     Girls Systolic BP Percentile --      Girls Diastolic BP Percentile --      Boys Systolic BP Percentile --      Boys Diastolic BP Percentile --      Pulse Rate 09/23/24 1548 75     Resp 09/23/24 1548 18     Temp 09/23/24 1548 98.6 F (37 C)     Temp Source 09/23/24 1548  Oral     SpO2 09/23/24 1548 95 %     Weight --      Height --      Head Circumference --      Peak Flow --      Pain Score 09/23/24 1552 8     Pain Loc --      Pain Education --      Exclude from Growth Chart --    No data found.  Updated Vital Signs BP (!) 173/97 (BP Location: Right Arm)   Pulse 75   Temp 98.6 F (37 C) (Oral)   Resp 18   SpO2 95%   Visual Acuity Right Eye Distance:   Left Eye Distance:   Bilateral Distance:    Right Eye Near:   Left Eye Near:    Bilateral Near:     Physical Exam Vitals and nursing note reviewed. Exam conducted with a chaperone present.  Constitutional:      Appearance: Normal appearance. She is not ill-appearing.  HENT:     Head: Atraumatic.  Eyes:     Extraocular Movements: Extraocular movements intact.     Conjunctiva/sclera: Conjunctivae normal.  Cardiovascular:     Rate and Rhythm: Normal rate.  Pulmonary:     Effort: Pulmonary effort is normal.  Genitourinary:    Comments: Vulvar erythema diffusely, skin thickened and areas of erythema.  No rashes or lesions, no appreciable discharge or other abnormalities Musculoskeletal:        General: Normal range of motion.     Cervical back: Normal range of motion and neck supple.  Skin:    General: Skin is warm and dry.  Neurological:     Mental Status: She is alert and oriented to person, place, and time.  Psychiatric:        Mood and Affect: Mood normal.        Thought Content: Thought content  normal.        Judgment: Judgment normal.      UC Treatments / Results  Labs (all labs ordered are listed, but only abnormal results are displayed) Labs Reviewed  POCT URINE DIPSTICK - Abnormal; Notable for the following components:      Result Value   Color, UA colorless (*)    All other components within normal limits    EKG   Radiology No results found.  Procedures Procedures (including critical care time)  Medications Ordered in UC Medications - No data to display  Initial Impression / Assessment and Plan / UC Course  I have reviewed the triage vital signs and the nursing notes.  Pertinent labs & imaging results that were available during my care of the patient were reviewed by me and considered in my medical decision making (see chart for details).     Discussed numerous possible causes of her ongoing vulvar irritation, including hormonal causes, inflammatory causes such as lichen or infection such as yeast.  Her vaginal swab at visit 2 months ago was negative and no active discharge or itching at this time so we will forego reswab today.  Do recommend further evaluation with GYN given recurrence and chronicity of symptoms as I do suspect her symptoms to be more inflammatory/irritant rather than infectious but will trial nystatin  in the interim to rule out benefit.  Blood pressure also significantly elevated today in triage.  Continue to monitor at home, compliant use of medications, and close follow-up if not resolving to within normal limits.  Final Clinical Impressions(s) /  UC Diagnoses   Final diagnoses:  Vulvar irritation  Elevated blood pressure reading     Discharge Instructions      As we discussed, we will try the nystatin  cream to see if this calms your irritation though I am not convinced given your negative vaginal swab last visit and physical exam this visit that the irritation is representative of a vaginal infection.  I do recommend following up  with a gynecologist for further evaluation if persisting as we reviewed as there are several other potential causes of your irritation.  In the meantime, avoid any scented soaps, feminine wipes or washes or anything else that could potentially be irritating to this sensitive area    ED Prescriptions     Medication Sig Dispense Auth. Provider   nystatin  cream (MYCOSTATIN ) Apply to affected area 2 times daily as needed 80 g [name], PA-C      PDMP not reviewed this encounter.   [name], PA-C 09/23/24 1723    Stuart Vernell [place], PA-C 09/23/24 1724

## 2024-09-23 NOTE — ED Triage Notes (Signed)
 Pt reports vaginal discomfort ongoing several weeks, but has gotten worse over the last 3 days feels like burning.

## 2024-11-09 ENCOUNTER — Other Ambulatory Visit: Payer: Self-pay

## 2024-11-09 ENCOUNTER — Emergency Department (HOSPITAL_COMMUNITY)

## 2024-11-09 ENCOUNTER — Inpatient Hospital Stay (HOSPITAL_COMMUNITY)
Admission: EM | Admit: 2024-11-09 | Discharge: 2024-11-14 | DRG: 064 | Disposition: A | Discharge status: Rehabilitation Facility | Attending: Infectious Diseases | Admitting: Infectious Diseases

## 2024-11-09 DIAGNOSIS — K219 Gastro-esophageal reflux disease without esophagitis: Secondary | ICD-10-CM | POA: Diagnosis present

## 2024-11-09 DIAGNOSIS — Z950 Presence of cardiac pacemaker: Secondary | ICD-10-CM | POA: Diagnosis not present

## 2024-11-09 DIAGNOSIS — G8929 Other chronic pain: Secondary | ICD-10-CM | POA: Diagnosis present

## 2024-11-09 DIAGNOSIS — G609 Hereditary and idiopathic neuropathy, unspecified: Secondary | ICD-10-CM | POA: Diagnosis not present

## 2024-11-09 DIAGNOSIS — Z7982 Long term (current) use of aspirin: Secondary | ICD-10-CM | POA: Diagnosis not present

## 2024-11-09 DIAGNOSIS — Z807 Family history of other malignant neoplasms of lymphoid, hematopoietic and related tissues: Secondary | ICD-10-CM

## 2024-11-09 DIAGNOSIS — I6381 Other cerebral infarction due to occlusion or stenosis of small artery: Secondary | ICD-10-CM | POA: Diagnosis present

## 2024-11-09 DIAGNOSIS — G4700 Insomnia, unspecified: Secondary | ICD-10-CM | POA: Diagnosis present

## 2024-11-09 DIAGNOSIS — I6389 Other cerebral infarction: Secondary | ICD-10-CM | POA: Diagnosis present

## 2024-11-09 DIAGNOSIS — R29705 NIHSS score 5: Secondary | ICD-10-CM | POA: Diagnosis not present

## 2024-11-09 DIAGNOSIS — Z7409 Other reduced mobility: Secondary | ICD-10-CM | POA: Diagnosis present

## 2024-11-09 DIAGNOSIS — E1340 Other specified diabetes mellitus with diabetic neuropathy, unspecified: Secondary | ICD-10-CM

## 2024-11-09 DIAGNOSIS — I441 Atrioventricular block, second degree: Secondary | ICD-10-CM | POA: Diagnosis present

## 2024-11-09 DIAGNOSIS — Z741 Need for assistance with personal care: Secondary | ICD-10-CM | POA: Diagnosis present

## 2024-11-09 DIAGNOSIS — R0789 Other chest pain: Secondary | ICD-10-CM | POA: Diagnosis present

## 2024-11-09 DIAGNOSIS — I639 Cerebral infarction, unspecified: Principal | ICD-10-CM | POA: Diagnosis present

## 2024-11-09 DIAGNOSIS — G8191 Hemiplegia, unspecified affecting right dominant side: Secondary | ICD-10-CM | POA: Diagnosis present

## 2024-11-09 DIAGNOSIS — I10 Essential (primary) hypertension: Secondary | ICD-10-CM | POA: Diagnosis present

## 2024-11-09 DIAGNOSIS — Z820 Family history of epilepsy and other diseases of the nervous system: Secondary | ICD-10-CM

## 2024-11-09 DIAGNOSIS — E1140 Type 2 diabetes mellitus with diabetic neuropathy, unspecified: Secondary | ICD-10-CM | POA: Diagnosis present

## 2024-11-09 DIAGNOSIS — K760 Fatty (change of) liver, not elsewhere classified: Secondary | ICD-10-CM | POA: Diagnosis present

## 2024-11-09 DIAGNOSIS — G2581 Restless legs syndrome: Secondary | ICD-10-CM | POA: Diagnosis present

## 2024-11-09 DIAGNOSIS — I2510 Atherosclerotic heart disease of native coronary artery without angina pectoris: Secondary | ICD-10-CM | POA: Diagnosis present

## 2024-11-09 DIAGNOSIS — I159 Secondary hypertension, unspecified: Secondary | ICD-10-CM | POA: Diagnosis present

## 2024-11-09 DIAGNOSIS — Z79899 Other long term (current) drug therapy: Secondary | ICD-10-CM | POA: Diagnosis not present

## 2024-11-09 DIAGNOSIS — Z602 Problems related to living alone: Secondary | ICD-10-CM | POA: Diagnosis present

## 2024-11-09 DIAGNOSIS — G936 Cerebral edema: Secondary | ICD-10-CM | POA: Diagnosis present

## 2024-11-09 DIAGNOSIS — Z811 Family history of alcohol abuse and dependence: Secondary | ICD-10-CM

## 2024-11-09 DIAGNOSIS — Z90710 Acquired absence of both cervix and uterus: Secondary | ICD-10-CM

## 2024-11-09 DIAGNOSIS — E785 Hyperlipidemia, unspecified: Secondary | ICD-10-CM | POA: Diagnosis present

## 2024-11-09 DIAGNOSIS — Z8616 Personal history of COVID-19: Secondary | ICD-10-CM | POA: Diagnosis not present

## 2024-11-09 DIAGNOSIS — R42 Dizziness and giddiness: Secondary | ICD-10-CM | POA: Diagnosis not present

## 2024-11-09 DIAGNOSIS — Z66 Do not resuscitate: Secondary | ICD-10-CM | POA: Diagnosis present

## 2024-11-09 DIAGNOSIS — R4701 Aphasia: Secondary | ICD-10-CM | POA: Diagnosis present

## 2024-11-09 DIAGNOSIS — D72829 Elevated white blood cell count, unspecified: Secondary | ICD-10-CM | POA: Diagnosis present

## 2024-11-09 DIAGNOSIS — M4800 Spinal stenosis, site unspecified: Secondary | ICD-10-CM | POA: Diagnosis not present

## 2024-11-09 DIAGNOSIS — R079 Chest pain, unspecified: Secondary | ICD-10-CM | POA: Diagnosis not present

## 2024-11-09 DIAGNOSIS — R1310 Dysphagia, unspecified: Secondary | ICD-10-CM | POA: Diagnosis present

## 2024-11-09 DIAGNOSIS — M48061 Spinal stenosis, lumbar region without neurogenic claudication: Secondary | ICD-10-CM | POA: Diagnosis not present

## 2024-11-09 DIAGNOSIS — E538 Deficiency of other specified B group vitamins: Secondary | ICD-10-CM | POA: Diagnosis present

## 2024-11-09 DIAGNOSIS — G90A Postural orthostatic tachycardia syndrome (POTS): Secondary | ICD-10-CM | POA: Diagnosis present

## 2024-11-09 DIAGNOSIS — Z8673 Personal history of transient ischemic attack (TIA), and cerebral infarction without residual deficits: Secondary | ICD-10-CM

## 2024-11-09 DIAGNOSIS — R29810 Facial weakness: Secondary | ICD-10-CM | POA: Diagnosis present

## 2024-11-09 DIAGNOSIS — Z801 Family history of malignant neoplasm of trachea, bronchus and lung: Secondary | ICD-10-CM

## 2024-11-09 DIAGNOSIS — R29703 NIHSS score 3: Secondary | ICD-10-CM | POA: Diagnosis not present

## 2024-11-09 DIAGNOSIS — Z888 Allergy status to other drugs, medicaments and biological substances status: Secondary | ICD-10-CM

## 2024-11-09 DIAGNOSIS — Z806 Family history of leukemia: Secondary | ICD-10-CM

## 2024-11-09 DIAGNOSIS — K5900 Constipation, unspecified: Secondary | ICD-10-CM | POA: Diagnosis not present

## 2024-11-09 DIAGNOSIS — I69351 Hemiplegia and hemiparesis following cerebral infarction affecting right dominant side: Secondary | ICD-10-CM | POA: Diagnosis not present

## 2024-11-09 DIAGNOSIS — I7090 Unspecified atherosclerosis: Secondary | ICD-10-CM | POA: Diagnosis not present

## 2024-11-09 DIAGNOSIS — R471 Dysarthria and anarthria: Secondary | ICD-10-CM | POA: Diagnosis present

## 2024-11-09 DIAGNOSIS — R1084 Generalized abdominal pain: Secondary | ICD-10-CM | POA: Diagnosis not present

## 2024-11-09 LAB — PROTIME-INR
INR: 0.9 (ref 0.8–1.2)
Prothrombin Time: 13.1 s (ref 11.4–15.2)

## 2024-11-09 LAB — DIFFERENTIAL
Abs Immature Granulocytes: 0.06 K/uL (ref 0.00–0.07)
Basophils Absolute: 0 K/uL (ref 0.0–0.1)
Basophils Relative: 0 %
Eosinophils Absolute: 0 K/uL (ref 0.0–0.5)
Eosinophils Relative: 0 %
Immature Granulocytes: 1 %
Lymphocytes Relative: 9 %
Lymphs Abs: 1.1 K/uL (ref 0.7–4.0)
Monocytes Absolute: 1 K/uL (ref 0.1–1.0)
Monocytes Relative: 8 %
Neutro Abs: 9.8 K/uL — ABNORMAL HIGH (ref 1.7–7.7)
Neutrophils Relative %: 82 %

## 2024-11-09 LAB — I-STAT CHEM 8, ED
BUN: 16 mg/dL (ref 8–23)
Calcium, Ion: 1.09 mmol/L — ABNORMAL LOW (ref 1.15–1.40)
Chloride: 103 mmol/L (ref 98–111)
Creatinine, Ser: 1 mg/dL (ref 0.44–1.00)
Glucose, Bld: 96 mg/dL (ref 70–99)
HCT: 37 % (ref 36.0–46.0)
Hemoglobin: 12.6 g/dL (ref 12.0–15.0)
Potassium: 3.5 mmol/L (ref 3.5–5.1)
Sodium: 141 mmol/L (ref 135–145)
TCO2: 28 mmol/L (ref 22–32)

## 2024-11-09 LAB — COMPREHENSIVE METABOLIC PANEL WITH GFR
ALT: 13 U/L (ref 0–44)
AST: 28 U/L (ref 15–41)
Albumin: 4.3 g/dL (ref 3.5–5.0)
Alkaline Phosphatase: 95 U/L (ref 38–126)
Anion gap: 13 (ref 5–15)
BUN: 15 mg/dL (ref 8–23)
CO2: 23 mmol/L (ref 22–32)
Calcium: 9.8 mg/dL (ref 8.9–10.3)
Chloride: 102 mmol/L (ref 98–111)
Creatinine, Ser: 0.93 mg/dL (ref 0.44–1.00)
GFR, Estimated: >60 mL/min
Glucose, Bld: 97 mg/dL (ref 70–99)
Potassium: 3.8 mmol/L (ref 3.5–5.1)
Sodium: 138 mmol/L (ref 135–145)
Total Bilirubin: 0.4 mg/dL (ref 0.0–1.2)
Total Protein: 7.3 g/dL (ref 6.5–8.1)

## 2024-11-09 LAB — CBC
HCT: 39.6 % (ref 36.0–46.0)
Hemoglobin: 13 g/dL (ref 12.0–15.0)
MCH: 30 pg (ref 26.0–34.0)
MCHC: 32.8 g/dL (ref 30.0–36.0)
MCV: 91.5 fL (ref 80.0–100.0)
Platelets: 265 K/uL (ref 150–400)
RBC: 4.33 MIL/uL (ref 3.87–5.11)
RDW: 13.2 % (ref 11.5–15.5)
WBC: 12 K/uL — ABNORMAL HIGH (ref 4.0–10.5)
nRBC: 0 % (ref 0.0–0.2)

## 2024-11-09 LAB — CBG MONITORING, ED: Glucose-Capillary: 90 mg/dL (ref 70–99)

## 2024-11-09 LAB — HEMOGLOBIN A1C
Hgb A1c MFr Bld: 5.3 % (ref 4.8–5.6)
Mean Plasma Glucose: 105.41 mg/dL

## 2024-11-09 LAB — ETHANOL: Alcohol, Ethyl (B): <15 mg/dL

## 2024-11-09 LAB — APTT: aPTT: 28 s (ref 24–36)

## 2024-11-09 MED ORDER — IOHEXOL 350 MG/ML SOLN
100.0000 mL | Freq: Once | INTRAVENOUS | Status: AC | PRN
  Administered 2024-11-09: 100 mL via INTRAVENOUS

## 2024-11-09 MED ORDER — GABAPENTIN 300 MG PO CAPS
300.0000 mg | ORAL_CAPSULE | Freq: Two times a day (BID) | ORAL | Status: DC | PRN
  Administered 2024-11-10 – 2024-11-11 (×3): 300 mg via ORAL
  Filled 2024-11-09 (×4): qty 1

## 2024-11-09 MED ORDER — HYDROCODONE-ACETAMINOPHEN 5-325 MG PO TABS
1.0000 | ORAL_TABLET | Freq: Two times a day (BID) | ORAL | Status: DC
  Administered 2024-11-09 – 2024-11-14 (×11): 1 via ORAL
  Filled 2024-11-09 (×11): qty 1

## 2024-11-09 MED ORDER — PROPYLENE GLYCOL 0.6 % OP SOLN: 1.0000 [drp] | Freq: Every day | OPHTHALMIC | Status: DC | PRN

## 2024-11-09 MED ORDER — STROKE: EARLY STAGES OF RECOVERY BOOK
Freq: Once | Status: AC
  Filled 2024-11-09: qty 1

## 2024-11-09 MED ORDER — ZOLPIDEM TARTRATE 5 MG PO TABS
5.0000 mg | ORAL_TABLET | Freq: Every day | ORAL | Status: DC
  Administered 2024-11-09 – 2024-11-13 (×5): 5 mg via ORAL
  Filled 2024-11-09 (×5): qty 1

## 2024-11-09 MED ORDER — SENNOSIDES-DOCUSATE SODIUM 8.6-50 MG PO TABS
1.0000 | ORAL_TABLET | Freq: Every evening | ORAL | Status: DC | PRN
  Administered 2024-11-13: 1 via ORAL
  Filled 2024-11-09: qty 1

## 2024-11-09 MED ORDER — ASPIRIN 81 MG PO CHEW
81.0000 mg | CHEWABLE_TABLET | Freq: Every day | ORAL | Status: DC
  Administered 2024-11-09 – 2024-11-14 (×6): 81 mg via ORAL
  Filled 2024-11-09 (×6): qty 1

## 2024-11-09 MED ORDER — POLYVINYL ALCOHOL 1.4 % OP SOLN: 1.0000 [drp] | OPHTHALMIC | Status: DC | PRN

## 2024-11-09 MED ORDER — ACETAMINOPHEN 500 MG PO TABS
500.0000 mg | ORAL_TABLET | Freq: Four times a day (QID) | ORAL | Status: DC | PRN
  Administered 2024-11-09 – 2024-11-14 (×7): 500 mg via ORAL
  Filled 2024-11-09 (×7): qty 1

## 2024-11-09 MED ORDER — HYDROCODONE-ACETAMINOPHEN 5-325 MG PO TABS: 1.0000 | ORAL_TABLET | Freq: Two times a day (BID) | ORAL | Status: DC | PRN

## 2024-11-09 MED ORDER — ENOXAPARIN SODIUM 30 MG/0.3ML IJ SOSY
30.0000 mg | PREFILLED_SYRINGE | INTRAMUSCULAR | Status: DC
  Administered 2024-11-09 – 2024-11-11 (×3): 30 mg via SUBCUTANEOUS
  Filled 2024-11-09 (×3): qty 0.3

## 2024-11-09 MED ORDER — ROSUVASTATIN CALCIUM 20 MG PO TABS
20.0000 mg | ORAL_TABLET | Freq: Every day | ORAL | Status: DC
  Administered 2024-11-09 – 2024-11-10 (×2): 20 mg via ORAL
  Filled 2024-11-09 (×2): qty 1

## 2024-11-09 MED ORDER — SODIUM CHLORIDE 0.9% FLUSH
3.0000 mL | Freq: Once | INTRAVENOUS | Status: AC
  Administered 2024-11-09: 3 mL via INTRAVENOUS

## 2024-11-09 NOTE — Evaluation (Signed)
 Physical Therapy Evaluation Patient Details Name: Kayla Terry MRN: [id number] DOB: 22-Sep-1946 Today's Date: 11/09/2024  History of Present Illness  79 year old female presents 1/10 after developing new dysarthria. 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     Clinical Impression  Pt admitted with above diagnosis. Previously independent, with rare use of SPC, denies falls. Family present to assist with hx due to dysarthria. They state cognition still a bit off from baseline. Patient required up to min assist +2 for transfers and gait in room, demonstrating slow shuffled steps and some mild LE incoordination with pre-gait activities. No overt buckling, some mild RLE weakness proximally compared to Lt. Gross assessment of UEs show RUE weakness compared to Lt as well. Patient will benefit from intensive inpatient follow-up therapy, >3 hours/day. Pt currently with functional limitations due to the deficits listed below (see PT Problem List). Pt will benefit from acute skilled PT to increase their independence and safety with mobility to allow discharge.           If plan is discharge home, recommend the following: A little help with walking and/or transfers;A little help with bathing/dressing/bathroom;Assistance with cooking/housework;Assist for transportation;Help with stairs or ramp for entrance;Supervision due to cognitive status   Can travel by private vehicle        Equipment Recommendations None recommended by PT  Recommendations for Other Services  Rehab consult;OT consult    Functional Status Assessment Patient has had a recent decline in their functional status and demonstrates the ability to make significant improvements in function in a reasonable and predictable amount of time.     Precautions / Restrictions  Precautions Precautions: Fall Recall of Precautions/Restrictions: Impaired Restrictions Weight Bearing Restrictions Per Provider Order: No      Mobility  Bed Mobility Overal bed mobility: Needs Assistance Bed Mobility: Supine to Sit     Supine to sit: Contact guard, HOB elevated     General bed mobility comments: CGA for safety, slow to rise, effortful.    Transfers Overall transfer level: Needs assistance Equipment used: 2 person hand held assist Transfers: Sit to/from Stand, Bed to chair/wheelchair/BSC Sit to Stand: Min assist   Step pivot transfers: Min assist       General transfer comment: Min assist +2 for boost and balance to rise from EOB with hand held support bil. Progressed to min assist +1 with bil hand support from therapist to rise. Min assist for step pivot transfer due to impaired balance.    Ambulation/Gait Ambulation/Gait assistance: Min assist, +2 safety/equipment, +2 physical assistance Gait Distance (Feet): 15 Feet Assistive device: 2 person hand held assist Gait Pattern/deviations: Step-through pattern, Decreased stride length, Shuffle, Narrow base of support, Drifts right/left Gait velocity: Dec Gait velocity interpretation: <1.31 ft/sec, indicative of household ambulator Pre-gait activities: weight shift, static march, poor foot clearance. General Gait Details: Min assist +2 for hand held support and balance to step around bed to recliner today with VC for larger steps and to widen BOS with poor carryover. No buckling noted. Guarded with steps, closes eyes frequently due to feeling off balance. Cues to keep eyes open as tolerated for safety.  Stairs            Wheelchair Mobility     Tilt Bed    Modified Rankin (Stroke Patients Only) Modified Rankin (Stroke Patients  Only) Pre-Morbid Rankin Score: No symptoms Modified Rankin: Moderately severe disability     Balance Overall balance assessment: Needs assistance Sitting-balance  support: No upper extremity supported, Feet supported Sitting balance-Leahy Scale: Fair Sitting balance - Comments: CGA eob   Standing balance support: Single extremity supported, During functional activity Standing balance-Leahy Scale: Poor Standing balance comment: hand held support EOb                             Pertinent Vitals/Pain Pain Assessment Pain Assessment: No/denies pain    Home Living Family/patient expects to be discharged to:: Private residence Living Arrangements: Alone Available Help at Discharge: Family;Available PRN/intermittently Type of Home: House Home Access: Ramped entrance       Home Layout: One level Home Equipment: Agricultural Consultant (2 wheels);Shower seat;Grab bars - tub/shower;Cane - single point      Prior Function Prior Level of Function : Independent/Modified Independent;Driving             Mobility Comments: Rare use of cane, denies falls. ADLs Comments: ind, heats up most food.     Extremity/Trunk Assessment   Upper Extremity Assessment Upper Extremity Assessment: Defer to OT evaluation    Lower Extremity Assessment Lower Extremity Assessment:  (Grossly 4+/5 with exception of Rt hip flexion 4/5. Mild coordination deficits.)       Communication   Communication Communication: Impaired Factors Affecting Communication: Difficulty expressing self;Reduced clarity of speech    Cognition Arousal: Alert Behavior During Therapy: WFL for tasks assessed/performed   PT - Cognitive impairments: Difficult to assess Difficult to assess due to: Impaired communication                       Following commands: Impaired Following commands impaired: Only follows one step commands consistently     Cueing Cueing Techniques: Verbal cues, Gestural cues, Tactile cues     General Comments General comments (skin integrity, edema, etc.): VSS throughout. Children present, supportive.    Exercises Other Exercises Other  Exercises: Standing heel and toe raises with bil hand support. Could raise heels simultaneously but has to alternate to raises. RLE seemed delayed.   Assessment/Plan    PT Assessment Patient needs continued PT services  PT Problem List Decreased strength;Decreased activity tolerance;Decreased balance;Decreased mobility;Decreased coordination;Decreased knowledge of use of DME;Decreased cognition;Decreased safety awareness;Decreased knowledge of precautions       PT Treatment Interventions DME instruction;Gait training;Functional mobility training;Therapeutic activities;Therapeutic exercise;Balance training;Neuromuscular re-education;Cognitive remediation;Patient/family education;Stair training    PT Goals (Current goals can be found in the Care Plan section)  Acute Rehab PT Goals Patient Stated Goal: get well PT Goal Formulation: With patient/family Time For Goal Achievement: 11/23/24 Potential to Achieve Goals: Good    Frequency Min 3X/week     Co-evaluation               AM-PAC PT 6 Clicks Mobility  Outcome Measure Help needed turning from your back to your side while in a flat bed without using bedrails?: A Little Help needed moving from lying on your back to sitting on the side of a flat bed without using bedrails?: A Little Help needed moving to and from a bed to a chair (including a wheelchair)?: A Little Help needed standing up from a chair using your arms (e.g., wheelchair or bedside chair)?: A Little Help needed to walk in hospital room?: A Little Help needed climbing 3-5 steps with a railing? : A Lot  6 Click Score: 17    End of Session Equipment Utilized During Treatment: Gait belt Activity Tolerance: Patient tolerated treatment well Patient left: in chair;with call bell/phone within reach;with chair alarm set;with family/visitor present Nurse Communication: Mobility status PT Visit Diagnosis: Unsteadiness on feet (R26.81);Other abnormalities of gait and  mobility (R26.89);Other symptoms and signs involving the nervous system (R29.898);Difficulty in walking, not elsewhere classified (R26.2)    Time: 8392-8371 PT Time Calculation (min) (ACUTE ONLY): 21 min   Charges:   PT Evaluation $PT Eval Low Complexity: 1 Low   PT General Charges $$ ACUTE PT VISIT: 1 Visit         Leontine Roads, PT, DPT [facility] Physical Therapist Office: [phone] Website: [email or web address]   Leontine GORMAN Roads 11/09/2024, 4:40 PM

## 2024-11-09 NOTE — ED Triage Notes (Signed)
 Pt BIBA from home for right sided facial droop, right extremity weakness and slurred speech. Code stroke was called. Upon arrival, pt Aox4. CBG -90 on arrival. Pt taken to CT scan with Stroke team

## 2024-11-09 NOTE — Code Documentation (Signed)
 Stroke Response Nurse Documentation Code Documentation  Kayla Terry is a 79 y.o. female arriving to [facility]  via [facility] on 11/09/2024 with past medical hx of cataract, HTN, 2nd Degree AV Block, CAD, Permanent pacer, prior thalamic stroke. On aspirin  81 mg daily. Code stroke was activated by EMS.   Patient from home where she was LKW at 11/09/2024 at 1700 and now complaining of right sided weakness, aphasia, right facial droop. Per EMS, patient went to sleep at 1700 and woke up at 0700 with symptoms. She called her daughter to tell her something was wrong and daughter called EMS.  Stroke team at the bedside on patient arrival. Labs drawn and patient cleared for CT by Dr. Doretha. Patient to CT with team. NIHSS 4, see documentation for details and code stroke times. Patient with right facial droop, right leg weakness, Expressive aphasia , and dysarthria  on exam. The following imaging was completed:  CT Head, CTA, and CTP. Patient is not a candidate for IV Thrombolytic due to outside of window per MD. Patient is not a candidate for IR due to no LVO noted on imaging per MD.   Care Plan: VS/NIHSS q2hr love, then q4hr; BP Goal <220/120; NPO until swallow screen passed  Process Delays Noted: N/A   Bedside handoff with ED RN Priya.    Kayla Terry  Stroke Response RN

## 2024-11-09 NOTE — Plan of Care (Signed)
   Problem: Education: Goal: Knowledge of disease or condition will improve Outcome: Progressing Goal: Knowledge of secondary prevention will improve (MUST DOCUMENT ALL) Outcome: Progressing Goal: Knowledge of patient specific risk factors will improve (DELETE if not current risk factor) Outcome: Progressing

## 2024-11-09 NOTE — ED Provider Notes (Signed)
 " [place] EMERGENCY DEPARTMENT AT [facility] Provider Note   CSN: [id number] Arrival date & time: 11/09/24  1026  An emergency department physician performed an initial assessment on this suspected stroke patient at 1029.  Patient presents with: Code Stroke   Kayla Terry is a 79 y.o. female.   Patient is a 79 year old female with a history of hypertension, GERD, CAD, prior stroke, complete heart block status post pacemaker who is presenting today as a code stroke.  Based on speaking with the patient she was last normal around 10:00 last night when she took her blood pressure medicine and went to bed.  She reports prior to going to bed she did not feel great but could not really note any specific complaint.  She did wake up in the middle the night to use the bathroom and noticed it was hard for her to walk and she was holding onto the wall.  However this morning she called her daughter at 54 when she woke up because she realized she could not speak.  She reports a minimal headache with ongoing issues with her speech but her daughter reports she was able to stand and walk without significant issues.  The history is provided by the patient, the EMS personnel and medical records.       Prior to Admission medications  Medication Sig Start Date End Date Taking? Authorizing Provider  amLODipine  (NORVASC ) 5 MG tablet Take 5 mg by mouth daily as needed (High BP).    [provider]  aspirin  EC 81 MG tablet Take 1 tablet (81 mg total) by mouth daily. Swallow whole. 10/22/22   Johnson, Clanford L, MD  famotidine  (PEPCID ) 20 MG tablet Take 20 mg by mouth daily as needed for heartburn or indigestion.    [provider]  gabapentin  (NEURONTIN ) 300 MG capsule Take 1-2 capsules (300-600 mg total) by mouth 3 (three) times daily. Patient taking differently: Take 300-600 mg by mouth 3 (three) times daily. As needed 06/14/23   [name], MD  HYDROcodone -acetaminophen   (NORCO/VICODIN) 5-325 MG tablet Take 1 tablet by mouth 2 (two) times daily as needed for severe pain (pain score 7-10). 03/21/24   [provider]  losartan  (COZAAR ) 50 MG tablet Take 1 tablet (50 mg total) by mouth daily. Patient taking differently: Take 50 mg by mouth daily as needed (High BP). 10/22/22   [name], MD  nystatin  cream (MYCOSTATIN ) Apply to affected area 2 times daily as needed 09/23/24   [name], PA-C  Peppermint Oil (IBGARD) 90 MG CPCR Take 90 mg by mouth daily as needed (IBS).    [provider]  Propylene Glycol (SYSTANE BALANCE) 0.6 % SOLN Place 1 drop into both eyes daily as needed (dry eye).    [provider]  traZODone  (DESYREL ) 100 MG tablet Take 100 mg by mouth at bedtime. 03/02/22   [provider]  zolpidem  (AMBIEN ) 10 MG tablet Take 10 mg by mouth at bedtime.    [provider]    Allergies: Fluvoxamine, Lexapro [escitalopram], Naproxen sodium, and Protonix  [pantoprazole ]    Review of Systems  Updated Vital Signs BP (!) 165/89   Pulse 65   Wt 69.4 kg   SpO2 100%   BMI 27.10 kg/m   Physical Exam Vitals and nursing note reviewed.  Constitutional:      General: She is not in acute distress.    Appearance: She is well-developed.  HENT:     Head:  Normocephalic and atraumatic.  Eyes:     Pupils: Pupils are equal, round, and reactive to light.  Cardiovascular:     Rate and Rhythm: Normal rate and regular rhythm.     Heart sounds: Normal heart sounds. No murmur heard.    No friction rub.  Pulmonary:     Effort: Pulmonary effort is normal.     Breath sounds: Normal breath sounds. No wheezing or rales.  Abdominal:     General: Bowel sounds are normal. There is no distension.     Palpations: Abdomen is soft.     Tenderness: There is no abdominal tenderness. There is no guarding or rebound.  Musculoskeletal:        General: No tenderness. Normal range of motion.     Comments: No edema   Skin:    General: Skin is warm and dry.     Findings: No rash.  Neurological:     Mental Status: She is alert and oriented to person, place, and time.     Cranial Nerves: Dysarthria and facial asymmetry present. No cranial nerve deficit.     Comments: Slight droop noted to the right side of the face with some mild right extremity weakness.  Psychiatric:        Behavior: Behavior normal.     (all labs ordered are listed, but only abnormal results are displayed) Labs Reviewed  CBC - Abnormal; Notable for the following components:      Result Value   WBC 12.0 (*)    All other components within normal limits  DIFFERENTIAL - Abnormal; Notable for the following components:   Neutro Abs 9.8 (*)    All other components within normal limits  I-STAT CHEM 8, ED - Abnormal; Notable for the following components:   Calcium , Ion 1.09 (*)    All other components within normal limits  PROTIME-INR  APTT  COMPREHENSIVE METABOLIC PANEL WITH GFR  ETHANOL  HEMOGLOBIN A1C  CBG MONITORING, ED    EKG: EKG Interpretation Date/Time:  Saturday November 09 2024 11:20:21 EST Ventricular Rate:  64 PR Interval:  60 QRS Duration:  135 QT Interval:  444 QTC Calculation: 459 R Axis:   -78  Text Interpretation: Electronic ventricular pacemaker No significant change since last tracing Confirmed by Doretha Folks ([id number]) on 11/09/2024 11:50:28 AM  Radiology: CT ANGIO HEAD NECK W WO CM W PERF (CODE STROKE) Result Date: 11/09/2024 EXAM: CTA Head and Neck with Perfusion 11/09/2024 10:48:56 AM TECHNIQUE: CTA of the head and neck was performed without and with the administration of intravenous contrast. iohexol  (OMNIPAQUE ) 350 MG/ML injection was administered. 3D postprocessing with multiplanar reconstructions and MIPs was performed to evaluate the vascular anatomy. Cerebral perfusion analysis using computed tomography with contrast administration, including post-processing of parametric maps with  determination of cerebral blood flow, cerebral blood volume, mean transit time and time-to-maximum. Automated exposure control, iterative reconstruction, and/or weight based adjustment of the mA/kV was utilized to reduce the radiation dose to as low as reasonably achievable. COMPARISON: Plain head CT 11/09/2024 reported separately. CTA head and neck 10/19/2022. CLINICAL HISTORY: 79 year old female with acute neuro deficit, stroke suspected. FINDINGS: CTA NECK: AORTIC ARCH AND ARCH VESSELS: 3-vessel arch is mostly visible. No significant arch atherosclerosis visible. Generalized mild tortuosity of proximal great vessels. Minimal to mild proximal great vessel atherosclerosis. Incomplete visualization of the brachiocephalic artery with normal right CCA origin. No dissection or arterial injury. No significant stenosis of the brachiocephalic or subclavian arteries. CERVICAL CAROTID ARTERIES: Tortuous right  CCA. Minimal calcified atherosclerosis at the right ICA origin. No right carotid stenosis. Mildly tortuous left CCA. Mild calcified atherosclerosis at the left ICA origin and bulb without stenosis. No left carotid stenosis. No dissection or arterial injury. No hemodynamically significant stenosis by NASCET criteria. CERVICAL VERTEBRAL ARTERIES: Both vertebral artery origins are normal aside from some tortuosity. Tortuous vertebral V2 segments. Codominant vertebral arteries with a tapered appearance as they cross the dura into the posterior fossa (series 9 image 152) but no significant stenosis. No dissection or arterial injury. No significant stenosis. CTA HEAD: ANTERIOR CIRCULATION: No significant stenosis of the internal carotid arteries. Normal right posterior communicating artery, the left is diminutive or absent. Diminutive or absent anterior communicating artery, normal variant. Normal MCA and ACA origins. Mild to moderate stenosis of the left ACA A2 segment on series 19 image 17 is new. Very distal left ACA (A4  or A5) irregularity and stenosis is stable since 2023 (same image). Mild bilateral MCA M1 segment irregularity and stenosis on series 15 image 16. No MCA branch occlusion. There is moderate stenosis of the distal aspect of the dominant left MCA posterior M2 division on series 19 image 25 which is new since 2023. No aneurysm. POSTERIOR CIRCULATION: Normal PICA origins. Normal SCA and left PCA origin. Fetal type right PCA origin. Mild mid basilar irregularity. No significant stenosis. Mild bilateral PCA irregularity, no significant stenosis. No significant stenosis of the vertebral arteries. No aneurysm. OTHER: Left chest cardiac pacemaker in place and partially visible with mild streak artifact. Otherwise negative visible upper chest. At the larynx the glottis is closed. Nonvascular neck soft tissue spaces are negative. Chronic severe cervical spinal facet arthropathy, with progressive degenerative facet ankylosis since prior. Chronic severe lower cervical disc and endplate degeneration. No acute osseous abnormality. INTRACRANIAL VENOUS: Incidental venous contrast reflux from left upper extremity injection throughout the left internal jugular vein and into the intracranial left transverse sinus. Major dural venous sinuses are enhancing and patent. CT PERFUSION: EXAM QUALITY: Exam quality is adequate with diagnostic perfusion maps. No significant motion artifact. Appropriate arterial inflow and venous outflow curves. CORE INFARCT (CBF<30% volume): 0 mL TOTAL HYPOPERFUSION (Tmax>6s volume): 0 mL PENUMBRA: Mismatch volume: 0 mL Mismatch ratio: not applicable Location: not applicable IMPRESSION: 1. Negative CT perfusion. 2. Negative for large vessel occlusion. Intracranial atherosclerosis with some progression since 2023 (up to moderate left ACA A2, left MCA distal posterior M2 stenoses. Chronic very distal left ACA stenosis. 3. No extracranial stenosis. Electronically signed by: Helayne Hurst MD MD 11/09/2024 11:01 AM EST  RP Workstation: [id number]   CT HEAD CODE STROKE WO CONTRAST Result Date: 11/09/2024 EXAM: CT HEAD WITHOUT CONTRAST 11/09/2024 10:35:24 AM TECHNIQUE: CT of the head was performed without the administration of intravenous contrast. Automated exposure control, iterative reconstruction, and/or weight based adjustment of the mA/kV was utilized to reduce the radiation dose to as low as reasonably achievable. COMPARISON: Brain MRI 10/19/2022, Head CT 04/10/2023. CLINICAL HISTORY: 79 year old female. Neurological deficit, acute, stroke suspected. FINDINGS: BRAIN AND VENTRICLES: No acute hemorrhage. Indistinct and confluent hypodensity in the left basal ganglia is new (series 3 image 16), and is most compatible with subacute lacunar infarct. There is subtle mass effect on the adjacent right lateral ventricle (series 9 image 30). No hemorrhagic transformation. No other left MCA territory cytotoxic edema. Superimposed chronic left thalamic lacunar infarct, present in 2024. Patchy additional deep white matter capsule hypodensity is stable. Faint calcified atherosclerosis at the skull base. No suspicious intracranial vascular hyperdensity. No hydrocephalus.  No extra-axial collection. No midline shift. ORBITS: Paranasal sinuses, tympanic cavities and mastoids are clear. No gaze deviation. SINUSES: Paranasal sinuses are clear. SOFT TISSUES AND SKULL: No acute soft tissue abnormality. No skull fracture. ALBERTA STROKE PROGRAM EARLY CT SCORE (ASPECTS): Ganglionic (caudate, IC, lentiform nucleus, insula, M1-M3): 5. Supraganglionic (M4-M6): 3. Total: 8. IMPRESSION: 1. Left basal ganglia subacute lacunar infarct, mild mass effect, no hemorrhage. ASPECTS 8. 2. Underlying chronic small vessel disease, including in the left thalamus. 3. These results were communicated to Dr. Nichola at 10:45am on 11/09/2024 by text page via the [facility] messaging system. Electronically signed by: Helayne Hurst MD MD 11/09/2024 10:46 AM EST RP Workstation:  [id number]     Procedures   Medications Ordered in the ED  sodium chloride  flush (NS) 0.9 % injection 3 mL (has no administration in time range)   stroke: early stages of recovery book (has no administration in time range)  iohexol  (OMNIPAQUE ) 350 MG/ML injection 100 mL (100 mLs Intravenous Contrast Given 11/09/24 1047)                                    Medical Decision Making Amount and/or Complexity of Data Reviewed External Data Reviewed: notes. Labs: ordered. Decision-making details documented in ED Course. Radiology: ordered and independent interpretation performed. Decision-making details documented in ED Course. ECG/medicine tests: ordered and independent interpretation performed. Decision-making details documented in ED Course.   Pt with multiple medical problems and comorbidities and presenting today with a complaint that caries a high risk for morbidity and mortality.  Here today with the above complaints.  She arrived as a code stroke and the neurology team was at bedside upon patient arrival.  Patient does take an aspirin  every day but no other anticoagulant.  Last known normal was 10 PM last night.  Patient is is displaying some right sided weakness and significant speech abnormality.  Suspicion for infectious symptoms or encephalopathy.  Blood sugar was stable.  Patient does take Vicodin and gabapentin  but reports those medications are not new.  Lower suspicion this is medication induced. I have independently visualized and interpreted pt's images today.  CT without evidence of acute bleed, radiology reports left basal ganglia subacute lacunar infarct but no hemorrhage as well as some other underlying chronic disease, CT perfusion was negative as well as LVO.  I independently interpreted patient's labs and EKG.  EKG shows a continually paced rhythm which is unchanged.  CBC with minimal leukocytosis of 12, normal hemoglobin, BMP, INR and alcohol  are all within normal limits.   Neurology has recommended admission for every 2 hour neurochecks.  Patient will not be able to receive an MRI until somebody is available to deal with her pacemaker.  She will be admitted for further care.  This was discussed with the patient and her family.  They are comfortable with this plan.     Final diagnoses:  Cerebrovascular accident (CVA), unspecified mechanism [facility])    ED Discharge Orders     None          Doretha Folks, MD 11/09/24 1151  "
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
## 2024-11-09 NOTE — Progress Notes (Incomplete)
Pt reporting headache.  

## 2024-11-09 NOTE — H&P (Signed)
 " Date: 11/09/2024               Patient Name:  Kayla Terry MRN: [id number]  DOB: [birth year] Age / Sex: 79 y.o., female   PCP: Lazoff, Shawn P, DO         Medical Service: Internal Medicine Teaching Service         Attending Physician: Dr. 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  History was taken from patient and family that are at bedside.  Per history patient went out to lunch yesterday and felt fine but she was with a friend till around 3:30 PM.  At that time when the friend was sleeping the patient, the patient reported not feeling well.  Patient reportedly took her blood pressure meds and went to bed.  Last night when she went to bed she was having a hard time walking.  When she woke up this morning she could not speak.  She called her daughter and her daughter that she was on the floor.  Daughter came over and at that time patient was standing at the door.  Patient does not have much recollection of this episode.  Patient stated that she developed a headache this morning that is now better today.  Patient has history of cataract surgery but no new visual changes in the last couple days.  Daughter reports that right facial droop is new.  Patient also stated that she had some chest pain this morning when she came into the hospital because she was nervous but denied any now.  Not any palpitations.  No recent falls.  Loss of consciousness or head strike. CVA work up  Review of Systems: A complete ROS was negative except as per HPI.   ED Course: Code stroke Blood pressure on  arrival 146/83 pulse 69, SpO2 100% on room air   Past Medical History: Severe spinal canal stenosis at L4-L5 secondary to disc bulging and facet arthropathy.  Left thalamocapsular infarct in 2023 WBC of 12, NA 138, creatinine 0.93, CBG 90 Ethanol within normal limits EKG: personally reviewed my interpretation is ventricular paced . Prior EKG is similar  Meds:  Amlodipine  5 mg  Losartan  50 mg, takes as prn Norco 5 mg-325 mg BID prn  Gabapentin  300 mg BID  Allergies: Allergies as of 11/09/2024 - Review Complete 11/09/2024  Allergen Reaction Noted   Fluvoxamine Other (See Comments) 12/28/2021   Lexapro [escitalopram] Nausea Only and Other (See Comments) 12/02/2020   Naproxen sodium Anxiety 12/24/2011   Protonix  [pantoprazole ] Other (See Comments) 12/02/2020    Past Surgical History:  Procedure Laterality Date   ABDOMINAL HYSTERECTOMY  02/1984   BACK SURGERY     CATARACT EXTRACTION, BILATERAL     COLONOSCOPY WITH PROPOFOL  N/A 01/17/2023   Procedure: COLONOSCOPY WITH PROPOFOL ;  Surgeon: Leigh Elspeth SQUIBB, MD;  Location: [facility];  Service: Gastroenterology;  Laterality: N/A;   LEFT HEART CATH AND CORONARY ANGIOGRAPHY N/A 04/22/2021   Procedure: LEFT HEART  CATH AND CORONARY ANGIOGRAPHY;  Surgeon: Verlin Lonni BIRCH, MD;  Location: [facility];  Service: Cardiovascular;  Laterality: N/A;   LUMBAR LAMINECTOMY     OOPHORECTOMY  02/29/1984   unilateral   PACEMAKER IMPLANT N/A 04/05/2024   Procedure: PACEMAKER IMPLANT;  Surgeon: Cindie Ole DASEN, MD;  Location: [facility];  Service: Cardiovascular;  Laterality: N/A;   POLYPECTOMY  01/17/2023   Procedure: POLYPECTOMY;  Surgeon: Leigh Elspeth SQUIBB, MD;  Location: [facility];  Service: Gastroenterology;;   TEMPORARY PACEMAKER N/A 04/04/2024   Procedure: TEMPORARY PACEMAKER;  Surgeon: Elmira Newman PARAS, MD;  Location: [facility];  Service: Cardiovascular;  Laterality: N/A;    Social:  Lives With:  Lives alone  Occupation:Not currently working  Support: Environmental Consultant and cane prn  Level of Function:Independent  PCP: Lazoff, Shawn P, DO  Substances: -Tobacco:None -Alcohol :None -Drugs:None  Family History:  Family History  Problem Relation Age of Onset   Cancer Mother    Leukemia Mother    Lung cancer Father    Multiple myeloma Father    Multiple sclerosis Sister    Alcohol  abuse Brother    Multiple myeloma Brother    Colon cancer Neg Hx    Colon polyps Neg Hx    Stomach cancer Neg Hx    Esophageal cancer Neg Hx    Neuropathy Neg Hx    Stroke Neg Hx      OBJECTIVE:   Physical Exam: Blood pressure (!) 165/89, pulse 65, weight 69.4 kg, SpO2 100%.  Constitutional: well-appearing female sitting in bed, in no acute distress HENT: normocephalic atraumatic, mucous membranes moist, no uvula or tongue deviation Cardiovascular: regular rate and rhythm, no murmurs noted Pulmonary/Chest: normal work of breathing on room air, lungs clear to auscultation bilaterally.  No crackles  Abdominal: soft, non-tender, non-distended.  Neurological: alert & oriented x 3, visual fields intact, right-sided facial droop noted, 4+ strength in right upper and lower extremity(w/ drift) and 5 out of 5 in left upper and lower extremity.  Grip strength intact.  Sensation intact. Generalized slow movements. Plantarflexion on the left but none appreciated on the right.  MSK: no gross abnormalities.  No pitting edema Skin: warm and dry Psych: Normal mood and affect  Labs:    Latest Ref Rng & Units 11/09/2024   10:34 AM 11/09/2024   10:29 AM 04/05/2024    3:42 AM  CBC  WBC 4.0 - 10.5 K/uL  12.0  7.2   Hemoglobin 12.0 - 15.0 g/dL 87.3  86.9  86.8   Hematocrit 36.0 - 46.0 % 37.0  39.6  39.6   Platelets 150 - 400 K/uL  265  265         Latest Ref Rng & Units 11/09/2024   10:34 AM 11/09/2024   10:29 AM 04/05/2024    3:42 AM  CMP  Glucose 70 - 99 mg/dL 96  97  881   BUN 8 - 23 mg/dL 16  15  <5   Creatinine  0.44 - 1.00 mg/dL 8.99  9.06  9.01   Sodium 135 - 145 mmol/L 141  138  137   Potassium 3.5 - 5.1 mmol/L 3.5  3.8  3.6   Chloride 98 - 111 mmol/L 103  102  104   CO2 22 - 32 mmol/L  23  24   Calcium  8.9 - 10.3 mg/dL  9.8  9.0   Total Protein 6.5 - 8.1 g/dL  7.3    Total Bilirubin 0.0 -  1.2 mg/dL  0.4    Alkaline Phos 38 - 126 U/L  95    AST 15 - 41 U/L  28    ALT 0 - 44 U/L  13       Imaging: CT ANGIO HEAD NECK W WO CM W PERF (CODE STROKE) Result Date: 11/09/2024 EXAM: CTA Head and Neck with Perfusion 11/09/2024 10:48:56 AM TECHNIQUE: CTA of the head and neck was performed without and with the administration of intravenous contrast. 100mL iohexol  (OMNIPAQUE ) 350 MG/ML injection was administered. 3D postprocessing with multiplanar reconstructions and MIPs was performed to evaluate the vascular anatomy. Cerebral perfusion analysis using computed tomography with contrast administration, including post-processing of parametric maps with determination of cerebral blood flow, cerebral blood volume, mean transit time and time-to-maximum. Automated exposure control, iterative reconstruction, and/or weight based adjustment of the mA/kV was utilized to reduce the radiation dose to as low as reasonably achievable. COMPARISON: Plain head CT 11/09/2024 reported separately. CTA head and neck 10/19/2022. CLINICAL HISTORY: 79 year old female with acute neuro deficit, stroke suspected. FINDINGS: CTA NECK: AORTIC ARCH AND ARCH VESSELS: 3-vessel arch is mostly visible. No significant arch atherosclerosis visible. Generalized mild tortuosity of proximal great vessels. Minimal to mild proximal great vessel atherosclerosis. Incomplete visualization of the brachiocephalic artery with normal right CCA origin. No dissection or arterial injury. No significant stenosis of the brachiocephalic or subclavian arteries. CERVICAL CAROTID ARTERIES: Tortuous right CCA. Minimal calcified atherosclerosis at the right ICA origin. No right  carotid stenosis. Mildly tortuous left CCA. Mild calcified atherosclerosis at the left ICA origin and bulb without stenosis. No left carotid stenosis. No dissection or arterial injury. No hemodynamically significant stenosis by NASCET criteria. CERVICAL VERTEBRAL ARTERIES: Both vertebral artery origins are normal aside from some tortuosity. Tortuous vertebral V2 segments. Codominant vertebral arteries with a tapered appearance as they cross the dura into the posterior fossa (series 9 image 152) but no significant stenosis. No dissection or arterial injury. No significant stenosis. CTA HEAD: ANTERIOR CIRCULATION: No significant stenosis of the internal carotid arteries. Normal right posterior communicating artery, the left is diminutive or absent. Diminutive or absent anterior communicating artery, normal variant. Normal MCA and ACA origins. Mild to moderate stenosis of the left ACA A2 segment on series 19 image 17 is new. Very distal left ACA (A4 or A5) irregularity and stenosis is stable since 2023 (same image). Mild bilateral MCA M1 segment irregularity and stenosis on series 15 image 16. No MCA branch occlusion. There is moderate stenosis of the distal aspect of the dominant left MCA posterior M2 division on series 19 image 25 which is new since 2023. No aneurysm. POSTERIOR CIRCULATION: Normal PICA origins. Normal SCA and left PCA origin. Fetal type right PCA origin. Mild mid basilar irregularity. No significant stenosis. Mild bilateral PCA irregularity, no significant stenosis. No significant stenosis of the vertebral arteries. No aneurysm. OTHER: Left chest cardiac pacemaker in place and partially visible with mild streak artifact. Otherwise negative visible upper chest. At the larynx the glottis is closed. Nonvascular neck soft tissue spaces are negative. Chronic severe cervical spinal facet arthropathy, with progressive degenerative facet ankylosis since prior. Chronic severe lower cervical disc and endplate  degeneration. No acute osseous abnormality. INTRACRANIAL VENOUS: Incidental venous contrast reflux from left upper extremity injection throughout the left internal jugular vein and into the intracranial left transverse sinus. Major dural venous sinuses are enhancing and patent. CT PERFUSION: EXAM QUALITY: Exam quality is adequate with diagnostic perfusion maps. No significant motion artifact. Appropriate arterial inflow  and venous outflow curves. CORE INFARCT (CBF<30% volume): 0 mL TOTAL HYPOPERFUSION (Tmax>6s volume): 0 mL PENUMBRA: Mismatch volume: 0 mL Mismatch ratio: not applicable Location: not applicable IMPRESSION: 1. Negative CT perfusion. 2. Negative for large vessel occlusion. Intracranial atherosclerosis with some progression since 2023 (up to moderate left ACA A2, left MCA distal posterior M2 stenoses. Chronic very distal left ACA stenosis. 3. No extracranial stenosis. Electronically signed by: Helayne Hurst MD MD 11/09/2024 11:01 AM EST RP Workstation: [id number]   CT HEAD CODE STROKE WO CONTRAST Result Date: 11/09/2024 EXAM: CT HEAD WITHOUT CONTRAST 11/09/2024 10:35:24 AM TECHNIQUE: CT of the head was performed without the administration of intravenous contrast. Automated exposure control, iterative reconstruction, and/or weight based adjustment of the mA/kV was utilized to reduce the radiation dose to as low as reasonably achievable. COMPARISON: Brain MRI 10/19/2022, Head CT 04/10/2023. CLINICAL HISTORY: 79 year old female. Neurological deficit, acute, stroke suspected. FINDINGS: BRAIN AND VENTRICLES: No acute hemorrhage. Indistinct and confluent hypodensity in the left basal ganglia is new (series 3 image 16), and is most compatible with subacute lacunar infarct. There is subtle mass effect on the adjacent right lateral ventricle (series 9 image 30). No hemorrhagic transformation. No other left MCA territory cytotoxic edema. Superimposed chronic left thalamic lacunar infarct, present in 2024. Patchy  additional deep white matter capsule hypodensity is stable. Faint calcified atherosclerosis at the skull base. No suspicious intracranial vascular hyperdensity. No hydrocephalus. No extra-axial collection. No midline shift. ORBITS: Paranasal sinuses, tympanic cavities and mastoids are clear. No gaze deviation. SINUSES: Paranasal sinuses are clear. SOFT TISSUES AND SKULL: No acute soft tissue abnormality. No skull fracture. ALBERTA STROKE PROGRAM EARLY CT SCORE (ASPECTS): Ganglionic (caudate, IC, lentiform nucleus, insula, M1-M3): 5. Supraganglionic (M4-M6): 3. Total: 8. IMPRESSION: 1. Left basal ganglia subacute lacunar infarct, mild mass effect, no hemorrhage. ASPECTS 8. 2. Underlying chronic small vessel disease, including in the left thalamus. 3. These results were communicated to Dr. Nichola at 10:45am on 11/09/2024 by text page via the [facility] messaging system. 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 Admitted for further work up .   #Subacute left basal ganglia subacute infarct #Mild Mass effect 2/2 CVA ED showed left basal ganglia subacute lacunar infarct, mild mass effect but no signs of hemorrhage.  Underlying chronic small vessel disease.  Patient has right sided facial droop and 4+  strength in right upper and lower extremities.  Also has dysarthria.  These are all new although patient had history of left thalamic capsular infarct in 2023 but no residual deficits.  Patient was brought in as code stroke and admitted for further workup with this.  Alcohol  within normal limits and no other ingestion.  Do not believe this is secondary to any infection.  Leukocytosis likely reactive.  No large vessel occlusion noted on CTA perfusion. --Stroke team following.  Appreciate their recommendations - Continue aspirin .  Holding on DAPT at this time until MRI - Frequent neurochecks - Permissive hypertension holding BP meds - Hemoglobin A1c - lipid panel   - starting rosuvastatin  20 mg  - MRI of the brain without contrast pending -Echocardiogram - PT consult, OT consult, speech consult  #Leukocytosis  Mild.  Likely reactive in the setting of stroke.  Lower concern for infection.  Continue to trend  #Second Degree AV block s/p [employer] PPM  Last cards appt 07/15/2024.  Last pacemaker check documented in chart is from 08/22/2024.  Per name device appears to be MRI compatible.  #Insomnia Patient takes ambien  10 mg at home nightly with consistent fills. To limit withdrawal effects, will administer 5 mg nightly with hopes to d/c in near future.  Plan: -Ambien  5 mg nightly  #Chronic pain secondary to spinal canal stenosis No acute concern -Continue home meds of hydrocodone /acetaminophen  5-325 mg 2 times daily and gabapentin  300 mg 2 times daily as needed  Diet: Normal VTE: Enoxaparin  Code: DNR/DNI  Prior to Admission Living Arrangement: Home, living alone Anticipated Discharge Location: Pending PT/OT/SLP recs Barriers to Discharge: Evaluation of stroke  Dispo: Admit patient to Observation with expected length of stay less than 2 midnights.  Signed:   Larysa Pall D'Mello, DO  Internal Medicine Resident PGY-1 11/09/2024, 2:28 PM   Please contact the on call pager at  [phone]  "

## 2024-11-10 ENCOUNTER — Encounter (HOSPITAL_COMMUNITY): Payer: Self-pay | Admitting: Internal Medicine

## 2024-11-10 ENCOUNTER — Inpatient Hospital Stay (HOSPITAL_COMMUNITY)

## 2024-11-10 DIAGNOSIS — I6389 Other cerebral infarction: Secondary | ICD-10-CM | POA: Diagnosis not present

## 2024-11-10 DIAGNOSIS — I639 Cerebral infarction, unspecified: Secondary | ICD-10-CM

## 2024-11-10 DIAGNOSIS — I7090 Unspecified atherosclerosis: Secondary | ICD-10-CM

## 2024-11-10 DIAGNOSIS — E785 Hyperlipidemia, unspecified: Secondary | ICD-10-CM

## 2024-11-10 DIAGNOSIS — I10 Essential (primary) hypertension: Secondary | ICD-10-CM | POA: Diagnosis not present

## 2024-11-10 DIAGNOSIS — R29703 NIHSS score 3: Secondary | ICD-10-CM

## 2024-11-10 LAB — BASIC METABOLIC PANEL WITH GFR
Anion gap: 11 (ref 5–15)
BUN: 15 mg/dL (ref 8–23)
CO2: 28 mmol/L (ref 22–32)
Calcium: 9.1 mg/dL (ref 8.9–10.3)
Chloride: 101 mmol/L (ref 98–111)
Creatinine, Ser: 0.91 mg/dL (ref 0.44–1.00)
GFR, Estimated: >60 mL/min
Glucose, Bld: 95 mg/dL (ref 70–99)
Potassium: 3.3 mmol/L — ABNORMAL LOW (ref 3.5–5.1)
Sodium: 140 mmol/L (ref 135–145)

## 2024-11-10 LAB — CBC
HCT: 36.5 % (ref 36.0–46.0)
Hemoglobin: 12.3 g/dL (ref 12.0–15.0)
MCH: 29.6 pg (ref 26.0–34.0)
MCHC: 33.7 g/dL (ref 30.0–36.0)
MCV: 88 fL (ref 80.0–100.0)
Platelets: 246 K/uL (ref 150–400)
RBC: 4.15 MIL/uL (ref 3.87–5.11)
RDW: 13.4 % (ref 11.5–15.5)
WBC: 7 K/uL (ref 4.0–10.5)
nRBC: 0 % (ref 0.0–0.2)

## 2024-11-10 LAB — LIPID PANEL
Cholesterol: 253 mg/dL — ABNORMAL HIGH (ref 0–200)
HDL: 59 mg/dL
LDL Cholesterol: 162 mg/dL — ABNORMAL HIGH (ref 0–99)
Total CHOL/HDL Ratio: 4.3 ratio
Triglycerides: 163 mg/dL — ABNORMAL HIGH
VLDL: 33 mg/dL (ref 0–40)

## 2024-11-10 LAB — TROPONIN T, HIGH SENSITIVITY: Troponin T High Sensitivity: <15 ng/L (ref 0–19)

## 2024-11-10 LAB — MAGNESIUM: Magnesium: 2.1 mg/dL (ref 1.7–2.4)

## 2024-11-10 MED ORDER — CLOPIDOGREL BISULFATE 75 MG PO TABS
75.0000 mg | ORAL_TABLET | Freq: Every day | ORAL | Status: DC
  Administered 2024-11-10 – 2024-11-14 (×5): 75 mg via ORAL
  Filled 2024-11-10 (×5): qty 1

## 2024-11-10 MED ORDER — LACTATED RINGERS IV BOLUS
500.0000 mL | Freq: Once | INTRAVENOUS | Status: AC
  Administered 2024-11-10: 500 mL via INTRAVENOUS

## 2024-11-10 MED ORDER — POTASSIUM CHLORIDE 20 MEQ PO PACK
40.0000 meq | PACK | Freq: Once | ORAL | Status: AC
  Administered 2024-11-10: 40 meq via ORAL
  Filled 2024-11-10: qty 2

## 2024-11-10 MED ORDER — FAMOTIDINE 20 MG PO TABS
20.0000 mg | ORAL_TABLET | Freq: Every day | ORAL | Status: DC
  Administered 2024-11-10 – 2024-11-14 (×5): 20 mg via ORAL
  Filled 2024-11-10 (×5): qty 1

## 2024-11-10 MED ORDER — ROSUVASTATIN CALCIUM 20 MG PO TABS
40.0000 mg | ORAL_TABLET | Freq: Every day | ORAL | Status: DC
  Administered 2024-11-11 – 2024-11-14 (×4): 40 mg via ORAL
  Filled 2024-11-10 (×4): qty 2

## 2024-11-10 MED ORDER — HYDROMORPHONE HCL 2 MG PO TABS
1.0000 mg | ORAL_TABLET | Freq: Once | ORAL | Status: AC
  Administered 2024-11-10: 1 mg via ORAL
  Filled 2024-11-10: qty 1

## 2024-11-10 MED ORDER — LIDOCAINE 5 % EX PTCH
1.0000 | MEDICATED_PATCH | Freq: Once | CUTANEOUS | Status: AC
  Administered 2024-11-10: 1 via TRANSDERMAL
  Filled 2024-11-10: qty 1

## 2024-11-10 NOTE — Evaluation (Signed)
 Occupational Therapy Evaluation Patient Details Name: Kayla Terry MRN: [id number] DOB: 19-Aug-1946 Today's Date: 11/10/2024   History of Present Illness   79 year old female presents 1/10 after developing new dysarthria. 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     Clinical Impressions PTA, pt lived alone and reports being independent within her home. Upon eval, pt presents with dizziness, decreased balance, safety awareness, processing speed, and communication affecting functional independence in BADL. Currently needing grossly min A for OOB ADL and observed with eyes closed most of the session when upright stating it helps her dizziness which is exacerbated by gaze shifts and head turns. Due to significant functional decline, recommending intensive multidisciplinary rehabilitation >3 hours/day to optimize safety and independence in ADL.       If plan is discharge home, recommend the following:   A little help with walking and/or transfers;A little help with bathing/dressing/bathroom;Assistance with cooking/housework;Assist for transportation;Help with stairs or ramp for entrance     Functional Status Assessment   Patient has had a recent decline in their functional status and demonstrates the ability to make significant improvements in function in a reasonable and predictable amount of time.     Equipment Recommendations   Other (comment) (defer)     Recommendations for Other Services         Precautions/Restrictions   Precautions Precautions: Fall Recall of Precautions/Restrictions: Impaired Restrictions Weight Bearing Restrictions Per Provider Order: No     Mobility Bed Mobility Overal bed mobility: Needs Assistance Bed Mobility: Supine to Sit     Supine to sit: Contact guard, HOB  elevated     General bed mobility comments: CGA for safety, slow to rise, effortful.    Transfers Overall transfer level: Needs assistance Equipment used: Rolling walker (2 wheels), None Transfers: Sit to/from Stand, Bed to chair/wheelchair/BSC Sit to Stand: Min assist     Step pivot transfers: Min assist     General transfer comment: Min assist for boost and balance to rise from EOB Min assist for step pivot transfer due to impaired balance.      Balance Overall balance assessment: Needs assistance Sitting-balance support: No upper extremity supported, Feet supported Sitting balance-Leahy Scale: Fair Sitting balance - Comments: CGA eob   Standing balance support: Single extremity supported, During functional activity Standing balance-Leahy Scale: Poor Standing balance comment: hand held support EOb                           ADL either performed or assessed with clinical judgement   ADL Overall ADL's : Needs assistance/impaired Eating/Feeding: Set up;Sitting   Grooming: Set up;Sitting   Upper Body Bathing: Set up;Sitting   Lower Body Bathing: Minimal assistance;Sit to/from stand   Upper Body Dressing : Set up;Sitting   Lower Body Dressing: Minimal assistance;Sit to/from stand   Toilet Transfer: Minimal assistance;Stand-pivot;Rolling walker (2 wheels)   Toileting- Clothing Manipulation and Hygiene: Minimal assistance;Sit to/from stand       Functional mobility during ADLs: Minimal assistance;Rolling walker (2 wheels)       Vision Baseline Vision/History: 0 No visual deficits Ability to See in Adequate Light: 0 Adequate Patient Visual Report: No change from baseline Vision Assessment?: Wears glasses for reading;Vision impaired- to be further tested in functional context;Yes Eye Alignment: Within Functional Limits Tracking/Visual Pursuits:  (  discomfort with tracking) Visual Fields:  (max attempts to compensate via gaze shift limiting assessment)      Perception         Praxis         Pertinent Vitals/Pain Pain Assessment Pain Assessment: No/denies pain     Extremity/Trunk Assessment Upper Extremity Assessment Upper Extremity Assessment: Generalized weakness;Right hand dominant (generally weak bilaterally but functional. no overt coordination deficits noted, but needed cues to speed up task bilaterally)   Lower Extremity Assessment Lower Extremity Assessment: Defer to PT evaluation       Communication Communication Communication: Impaired Factors Affecting Communication: Difficulty expressing self;Reduced clarity of speech   Cognition Arousal: Alert Behavior During Therapy: [facility] for tasks assessed/performed Cognition: Difficult to assess Difficult to assess due to: Impaired communication           OT - Cognition Comments: overall, cognition seems WFL with pt oriented and following basic commands, memory assessment limited by word finding difficulty. intermittent cues for safety                 Following commands: Impaired Following commands impaired: Only follows one step commands consistently     Cueing  General Comments   Cueing Techniques: Verbal cues;Gestural cues;Tactile cues  pt with dizziness throughout  session with BP 123/92 (103) HR 96 seated and 105/79 (86) HR 113 with standing. attempted to get another BP standing 3 mins but pt reports being too dizzy and needing to sit   Exercises     Shoulder Instructions      Home Living Family/patient expects to be discharged to:: Private residence Living Arrangements: Alone Available Help at Discharge: Family;Available PRN/intermittently Type of Home: House Home Access: Ramped entrance     Home Layout: One level     Bathroom Shower/Tub: Chief Strategy Officer: Standard Bathroom Accessibility: Yes   Home Equipment: Agricultural Consultant (2 wheels);Shower seat;Grab bars - tub/shower;Cane - single point          Prior  Functioning/Environment Prior Level of Function : Independent/Modified Independent;Driving             Mobility Comments: Rare use of cane, denies falls. ADLs Comments: ind, heats up most food.    OT Problem List: Decreased strength;Decreased activity tolerance;Impaired balance (sitting and/or standing);Decreased cognition;Decreased safety awareness;Decreased knowledge of use of DME or AE   OT Treatment/Interventions: Self-care/ADL training;DME and/or AE instruction;Therapeutic activities;Patient/family education;Balance training      OT Goals(Current goals can be found in the care plan section)   Acute Rehab OT Goals Patient Stated Goal: get better OT Goal Formulation: With patient Time For Goal Achievement: 11/24/24 Potential to Achieve Goals: Good   OT Frequency:  Min 2X/week    Co-evaluation              AM-PAC OT 6 Clicks Daily Activity     Outcome Measure Help from another person eating meals?: A Little Help from another person taking care of personal grooming?: A Little Help from another person toileting, which includes using toliet, bedpan, or urinal?: A Little Help from another person bathing (including washing, rinsing, drying)?: A Little Help from another person to put on and taking off regular upper body clothing?: A Little Help from another person to put on and taking off regular lower body clothing?: A Little 6 Click Score: 18   End of Session Equipment Utilized During Treatment: Gait belt;Rolling walker (2 wheels) Nurse Communication: Mobility status  Activity Tolerance: Patient tolerated treatment well Patient left:  in bed;with call bell/phone within reach;with bed alarm set  OT Visit Diagnosis: Unsteadiness on feet (R26.81);Muscle weakness (generalized) (M62.81);Other symptoms and signs involving cognitive function                Time: 9042-8967 OT Time Calculation (min): 35 min Charges:  OT General Charges $OT Visit: 1 Visit OT  Evaluation $OT Eval Moderate Complexity: 1 Mod OT Treatments $Self Care/Home Management : 8-22 mins  Elma JONETTA Lebron FREDERICK, OTR/L [facility] Acute Rehabilitation Office: [phone]   Elma JONETTA Lebron 11/10/2024, 12:17 PM

## 2024-11-10 NOTE — Hospital Course (Addendum)
#  Subacute left basal ganglia subacute infarct #Mild Mass effect 2/2 CVA CT showed left basal ganglia subacute lacunar infarct.  Mild mass effect noted but no signs of hemorrhage.  Patient came in with deficits of right-sided facial droop, 4+ strength in right upper and lower extremities.  Also noted to have mild pronator drift on the right.  And dysarthria.  Patient was brought in as code stroke.  No large vessel occlusion on CTA perfusion.  Neurology followed.  Started on DAPT.  Pending MRI with MRI compatible pacemaker.  Permissive hypertension.  Hemoglobin A1c at goal.  LDL was above goal so rosuvastatin  40 was started.  Echo ordered.  PT, OT, speech consulted.  PT recommended CIR and patient is being evaluated  #Dizziness Noted on evaluation OT.  Likely secondary to orthostatics.  Patient was given bolus of fluids.  Repeat orthostatics needed.  Neurology did not feel like this was due to stroke  #Leukocytosis, resolved Likely reactive in the setting of stroke.  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  Last pacemaker check documented in chart is from 08/22/2024.  Per name device appears to be MRI compatible.   #Insomnia Patient takes ambien  10 mg at home nightly with consistent fills. To limit withdrawal effects, will administer 5 mg nightly with hopes to d/c in near future.  Plan: -Ambien  5 mg nightly   #Chronic pain secondary to spinal canal stenosis No acute concern -Continue home meds of hydrocodone /acetaminophen  5-325 mg 2 times daily and gabapentin  300 mg 2 times daily as needed  1/12: States that she's not feeling any worse or better overall today. She does think that her speech has improved. She endorses dizziness with her head turning to the right and if she were to stand up. She thinks that her strength and weakness is the same, mostly right-sided. She reports that her sensation feels the same. She reports some pain in her stomach, legs and feet. She  denies a BM today, but did have a BM yesterday. She does report some burning that has alleviated some after receiving the medication.

## 2024-11-10 NOTE — Progress Notes (Signed)

## 2024-11-10 NOTE — Progress Notes (Signed)
 STROKE TEAM PROGRESS NOTE   INTERIM HISTORY/SUBJECTIVE Seen in room with family at the bedside. Remains dysarthric though per family improved from yesterday.    OBJECTIVE  CBC    Component Value Date/Time   WBC 7.0 11/10/2024 0133   RBC 4.15 11/10/2024 0133   HGB 12.3 11/10/2024 0133   HCT 36.5 11/10/2024 0133   PLT 246 11/10/2024 0133   MCV 88.0 11/10/2024 0133   MCV 95.6 10/07/2013 0903   MCH 29.6 11/10/2024 0133   MCHC 33.7 11/10/2024 0133   RDW 13.4 11/10/2024 0133   LYMPHSABS 1.1 11/09/2024 1029   MONOABS 1.0 11/09/2024 1029   EOSABS 0.0 11/09/2024 1029   BASOSABS 0.0 11/09/2024 1029    BMET    Component Value Date/Time   NA 140 11/10/2024 0133   NA 140 05/06/2020 1435   K 3.3 (L) 11/10/2024 0133   CL 101 11/10/2024 0133   CO2 28 11/10/2024 0133   GLUCOSE 95 11/10/2024 0133   BUN 15 11/10/2024 0133   BUN 9 05/06/2020 1435   CREATININE 0.91 11/10/2024 0133   CREATININE 0.92 10/07/2013 0859   CALCIUM  9.1 11/10/2024 0133   GFRNONAA >60 11/10/2024 0133    IMAGING past 24 hours No results found.  Vitals:   11/09/24 1948 11/10/24 0638 11/10/24 0749 11/10/24 1115  BP: (!) 187/92 131/78 132/77 122/76  Pulse: 64 67 78 67  Resp: 18 18 17 17   Temp:  97.9 F (36.6 C) (!) 97.5 F (36.4 C) (!) 97.5 F (36.4 C)  TempSrc: Oral Oral Oral Oral  SpO2: 97% 99% 95% 95%  Weight:         PHYSICAL EXAM General:  Alert, well-nourished, well-developed patient in no acute distress Psych:  Mood and affect appropriate for situation CV: Regular rate and rhythm on monitor Respiratory:  Regular, unlabored respirations on room air GI: Abdomen soft and nontender   NEURO:  Mental Status: AA&Ox3, patient is able to give clear and coherent history Speech/Language: speech is dysarthric and slow.   Naming, repetition, fluency, and comprehension intact.  Cranial Nerves:  II: PERRL. Visual fields full.  III, IV, VI: EOMI. Eyelids elevate symmetrically.  V: Sensation is intact  to light touch and symmetrical to face.  VII: Face is symmetrical resting and smiling VIII: hearing intact to voice. IX, X: Palate elevates symmetrically. Phonation is normal.  KP:Dynloizm shrug 5/5. XII: tongue is midline without fasciculations. Motor: pronator drift (Right) arm and right leg Tone: is normal and bulk is normal Sensation- Intact to light touch bilaterally. Extinction absent to light touch to DSS.   Coordination: FTN intact bilaterally, HKS: no ataxia in BLE.No drift.  Gait- deferred    NIHSS on my exam 3 (dysarthria, right arm/leg  drift)   ASSESSMENT/PLAN  Kayla Terry is a 79 y.o. female with history of HTN, Mobitiz type 2, prior CVA, GERD, CAD, B12 deficiency and fatty liver who presents to [facility] ED via EMS as a Code stroke for acute onset of right side weakness, right facial droop and dysarthria. NIH on Admission 5.   Acute Ischemic Infarct:  left basal ganglia  Etiology:  Likely Atherosclerotic (HLD, intracranial atherosclerosis age) Code Stroke CT head - left basal ganglia infarct, ASPECTs 8 CTA head & neck No LVO. There is scattered intracranial atherosclerosis. small area of possible intramural hypodensity of left ICA per my read - discussed with radiology who agreed this was artifact rather than thrombus.  MRI  pending Carotid Doppler  na 2D Echo ordered  LDL 162 HgbA1c 5.3 VTE prophylaxis - lovenox , SCDs aspirin  81 mg daily prior to admission, now on aspirin  81 mg daily and clopidogrel  75 mg daily for 3 weeks and then asa alone. 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 Agree with the history and physical as documented above. Agree with the plan as documented, which I helped formulate. I have independently reviewed the chart, obtained history, review of systems and examined the patient.I have personally reviewed pertinent head/neck/spine imaging (CT/MRI). Please feel free to call with any questions.  Sarajane Daring, MD [facility]     To contact Stroke Continuity provider, please refer to [email or web address]. After hours, contact General Neurology

## 2024-11-10 NOTE — Plan of Care (Signed)
 Patient was seen laying in bed with family at bedside, evaluated for chest pain. Patient describes a sharp, substernal chest pain that occasionally radiates to her bilateral armpits. She first noticed this pain about 30-45 minutes after she ate lunch, and says it has been constant ever since. She says sometimes it will fade away, though cannot tell how long this pain resides but will come back suddenly. She says she has been experiencing this pain for the past few weeks, and has gotten worse since her dose of Plavix  this morning. EKG not concerning for ACS, and troponin undetectable, symptoms most consistent with GERD. CP can also be related to baseline elevated anxiety, as patient reports she is nervous she has not been getting her pain medication 3 times daily as scheduled like she should be. We will continue to monitor, and added famotidine  for reflux symptoms. Provided patient and family with reassurance that her CP is not related to an MI or acute coronary event, and not related to her plavix .

## 2024-11-10 NOTE — Progress Notes (Addendum)
 "  HD#1 SUBJECTIVE:  Patient Summary: Kayla Terry is a 79 year old female with a past medical history of Type II Mobitz s/p PPM, prior CVA in 2023 without residual deficits, chronic pain due to spinal stenosis, hypertension that presents today after developing new dysarthria and Code Stroke was called and patient was found to have subacute left basal ganglia infarct on CT. Admitted for further work up .     Overnight Events: no overnight events   Interim History: Patient's speech appears to be better this morning. Did discuss with her CIR placement and patient is agreeable.   OBJECTIVE:  Vital Signs: Vitals:   11/09/24 1315 11/09/24 1453 11/09/24 1948 11/10/24 0638  BP: (!) 160/90 (!) 175/83 (!) 187/92 131/78  Pulse: 75 60 64 67  Resp: (!) 21 20 18 18   Temp:  97.8 F (36.6 C) (!) 97.4 F (36.3 C) 97.9 F (36.6 C)  TempSrc:  Oral Oral Oral  SpO2: 100% 100% 97% 99%  Weight:       Supplemental O2: Room Air SpO2: 99 %  Filed Weights   11/09/24 1000  Weight: 69.4 kg     Intake/Output Summary (Last 24 hours) at 11/10/2024 0719 Last data filed at 11/09/2024 1944 Gross per 24 hour  Intake 30 ml  Output --  Net 30 ml   Net IO Since Admission: 30 mL [11/10/24 0719]  Physical Exam: Physical Exam Constitutional: well-appearing female sitting in bed, in no acute distress HENT: normocephalic atraumatic, mucous membranes moist Cardiovascular: regular rate and rhythm, no murmurs noted Pulmonary/Chest: normal work of breathing on room air, lungs clear to auscultation bilaterally.  No crackles  Abdominal: soft, non-tender, non-distended.  Neurological: alert & oriented x 3, right-sided facial droop noted, some pronator drift in right upper extremity. Right lower extremity strength seems improved.  MSK: no gross abnormalities.  No pitting edema Skin: warm and dry Psych: Normal mood and affect Patient Lines/Drains/Airways Status     Active Line/Drains/Airways     Name Placement  date Placement time Site Days   Peripheral IV 11/09/24 18 G Anterior;Distal;Left;Upper Arm 11/09/24  --  Arm  1   Peripheral IV 11/09/24 18 G Anterior;Distal;Right;Upper Arm 11/09/24  --  Arm  1            Pertinent labs and imaging:      Latest Ref Rng & Units 11/10/2024    1:33 AM 11/09/2024   10:34 AM 11/09/2024   10:29 AM  CBC  WBC 4.0 - 10.5 K/uL 7.0   12.0   Hemoglobin 12.0 - 15.0 g/dL 87.6  87.3  86.9   Hematocrit 36.0 - 46.0 % 36.5  37.0  39.6   Platelets 150 - 400 K/uL 246   265        Latest Ref Rng & Units 11/10/2024    1:33 AM 11/09/2024   10:34 AM 11/09/2024   10:29 AM  CMP  Glucose 70 - 99 mg/dL 95  96  97   BUN 8 - 23 mg/dL 15  16  15    Creatinine 0.44 - 1.00 mg/dL 9.08  8.99  9.06   Sodium 135 - 145 mmol/L 140  141  138   Potassium 3.5 - 5.1 mmol/L 3.3  3.5  3.8   Chloride 98 - 111 mmol/L 101  103  102   CO2 22 - 32 mmol/L 28   23   Calcium  8.9 - 10.3 mg/dL 9.1   9.8   Total Protein 6.5 -  8.1 g/dL   7.3   Total Bilirubin 0.0 - 1.2 mg/dL   0.4   Alkaline Phos 38 - 126 U/L   95   AST 15 - 41 U/L   28   ALT 0 - 44 U/L   13     CT ANGIO HEAD NECK W WO CM W PERF (CODE STROKE) Result Date: 11/09/2024 EXAM: CTA Head and Neck with Perfusion 11/09/2024 10:48:56 AM TECHNIQUE: CTA of the head and neck was performed without and with the administration of intravenous contrast. iohexol  (OMNIPAQUE ) 350 MG/ML injection was administered. 3D postprocessing with multiplanar reconstructions and MIPs was performed to evaluate the vascular anatomy. Cerebral perfusion analysis using computed tomography with contrast administration, including post-processing of parametric maps with determination of cerebral blood flow, cerebral blood volume, mean transit time and time-to-maximum. Automated exposure control, iterative reconstruction, and/or weight based adjustment of the mA/kV was utilized to reduce the radiation dose to as low as reasonably achievable. COMPARISON: Plain head CT  11/09/2024 reported separately. CTA head and neck 10/19/2022. CLINICAL HISTORY: 79 year old female with acute neuro deficit, stroke suspected. FINDINGS: CTA NECK: AORTIC ARCH AND ARCH VESSELS: 3-vessel arch is mostly visible. No significant arch atherosclerosis visible. Generalized mild tortuosity of proximal great vessels. Minimal to mild proximal great vessel atherosclerosis. Incomplete visualization of the brachiocephalic artery with normal right CCA origin. No dissection or arterial injury. No significant stenosis of the brachiocephalic or subclavian arteries. CERVICAL CAROTID ARTERIES: Tortuous right CCA. Minimal calcified atherosclerosis at the right ICA origin. No right carotid stenosis. Mildly tortuous left CCA. Mild calcified atherosclerosis at the left ICA origin and bulb without stenosis. No left carotid stenosis. No dissection or arterial injury. No hemodynamically significant stenosis by NASCET criteria. CERVICAL VERTEBRAL ARTERIES: Both vertebral artery origins are normal aside from some tortuosity. Tortuous vertebral V2 segments. Codominant vertebral arteries with a tapered appearance as they cross the dura into the posterior fossa (series 9 image 152) but no significant stenosis. No dissection or arterial injury. No significant stenosis. CTA HEAD: ANTERIOR CIRCULATION: No significant stenosis of the internal carotid arteries. Normal right posterior communicating artery, the left is diminutive or absent. Diminutive or absent anterior communicating artery, normal variant. Normal MCA and ACA origins. Mild to moderate stenosis of the left ACA A2 segment on series 19 image 17 is new. Very distal left ACA (A4 or A5) irregularity and stenosis is stable since 2023 (same image). Mild bilateral MCA M1 segment irregularity and stenosis on series 15 image 16. No MCA branch occlusion. There is moderate stenosis of the distal aspect of the dominant left MCA posterior M2 division on series 19 image 25 which is new  since 2023. No aneurysm. POSTERIOR CIRCULATION: Normal PICA origins. Normal SCA and left PCA origin. Fetal type right PCA origin. Mild mid basilar irregularity. No significant stenosis. Mild bilateral PCA irregularity, no significant stenosis. No significant stenosis of the vertebral arteries. No aneurysm. OTHER: Left chest cardiac pacemaker in place and partially visible with mild streak artifact. Otherwise negative visible upper chest. At the larynx the glottis is closed. Nonvascular neck soft tissue spaces are negative. Chronic severe cervical spinal facet arthropathy, with progressive degenerative facet ankylosis since prior. Chronic severe lower cervical disc and endplate degeneration. No acute osseous abnormality. INTRACRANIAL VENOUS: Incidental venous contrast reflux from left upper extremity injection throughout the left internal jugular vein and into the intracranial left transverse sinus. Major dural venous sinuses are enhancing and patent. CT PERFUSION: EXAM QUALITY: Exam quality is adequate with diagnostic  perfusion maps. No significant motion artifact. Appropriate arterial inflow and venous outflow curves. CORE INFARCT (CBF<30% volume): 0 mL TOTAL HYPOPERFUSION (Tmax>6s volume): 0 mL PENUMBRA: Mismatch volume: 0 mL Mismatch ratio: not applicable Location: not applicable IMPRESSION: 1. Negative CT perfusion. 2. Negative for large vessel occlusion. Intracranial atherosclerosis with some progression since 2023 (up to moderate left ACA A2, left MCA distal posterior M2 stenoses. Chronic very distal left ACA stenosis. 3. No extracranial stenosis. Electronically signed by: Helayne Hurst MD MD 11/09/2024 11:01 AM EST RP Workstation: [id number]   CT HEAD CODE STROKE WO CONTRAST Result Date: 11/09/2024 EXAM: CT HEAD WITHOUT CONTRAST 11/09/2024 10:35:24 AM TECHNIQUE: CT of the head was performed without the administration of intravenous contrast. Automated exposure control, iterative reconstruction, and/or weight  based adjustment of the mA/kV was utilized to reduce the radiation dose to as low as reasonably achievable. COMPARISON: Brain MRI 10/19/2022, Head CT 04/10/2023. CLINICAL HISTORY: 79 year old female. Neurological deficit, acute, stroke suspected. FINDINGS: BRAIN AND VENTRICLES: No acute hemorrhage. Indistinct and confluent hypodensity in the left basal ganglia is new (series 3 image 16), and is most compatible with subacute lacunar infarct. There is subtle mass effect on the adjacent right lateral ventricle (series 9 image 30). No hemorrhagic transformation. No other left MCA territory cytotoxic edema. Superimposed chronic left thalamic lacunar infarct, present in 2024. Patchy additional deep white matter capsule hypodensity is stable. Faint calcified atherosclerosis at the skull base. No suspicious intracranial vascular hyperdensity. No hydrocephalus. No extra-axial collection. No midline shift. ORBITS: Paranasal sinuses, tympanic cavities and mastoids are clear. No gaze deviation. SINUSES: Paranasal sinuses are clear. SOFT TISSUES AND SKULL: No acute soft tissue abnormality. No skull fracture. ALBERTA STROKE PROGRAM EARLY CT SCORE (ASPECTS): Ganglionic (caudate, IC, lentiform nucleus, insula, M1-M3): 5. Supraganglionic (M4-M6): 3. Total: 8. IMPRESSION: 1. Left basal ganglia subacute lacunar infarct, mild mass effect, no hemorrhage. ASPECTS 8. 2. Underlying chronic small vessel disease, including in the left thalamus. 3. These results were communicated to Dr. Nichola at 10:45am on 11/09/2024 by text page via the [facility] messaging system. Electronically signed by: Helayne Hurst MD MD 11/09/2024 10:46 AM EST RP Workstation: [id number]    ASSESSMENT/PLAN:  Assessment: Principal Problem:   Acute stroke due to ischemia [facility]) Active Problems:   Benign essential hypertension   Hyperlipidemia   Restless leg syndrome   Leukocytosis   Spinal stenosis of lumbar region   Second degree AV block, Mobitz type II   History  of stroke  Kayla Terry is a 79 year old female with a past medical history of Type II Mobitz s/p PPM, prior CVA in 2023 without residual deficits, chronic pain due to spinal stenosis, hypertension that presents today after developing new dysarthria and Code Stroke was called and patient was found to have subacute left basal ganglia infarct on CT. Admitted for further work up .    Plan: #Subacute left basal ganglia infarct #Mild Mass effect 2/2 CVA No new deficits overnight. Speech seems to be improved from yesterday but still dysarthric. Pronator drift noted in right upper extremity  --Stroke team following.  Appreciate their recommendations - Continue aspirin .  Holding on DAPT at this time until MRI - Frequent neurochecks - Permissive hypertension holding BP meds - Hemoglobin A1c 5.3, at goal, 162  - lipid panel. LDL goal <70,              - continue rosuvastatin  20 mg  - MRI of the brain without contrast pending - Pending Echocardiogram -PT and OT, recommend  CIR.  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 Per rad tech, no pacemaker MRIs on the weekend. Neurology informed.    #Insomnia On 10 mg at home, but not age appropriate dose.  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

## 2024-11-11 ENCOUNTER — Inpatient Hospital Stay (HOSPITAL_COMMUNITY)

## 2024-11-11 DIAGNOSIS — E785 Hyperlipidemia, unspecified: Secondary | ICD-10-CM | POA: Diagnosis not present

## 2024-11-11 DIAGNOSIS — Z7982 Long term (current) use of aspirin: Secondary | ICD-10-CM | POA: Diagnosis not present

## 2024-11-11 DIAGNOSIS — G4700 Insomnia, unspecified: Secondary | ICD-10-CM | POA: Diagnosis not present

## 2024-11-11 DIAGNOSIS — R079 Chest pain, unspecified: Secondary | ICD-10-CM | POA: Diagnosis not present

## 2024-11-11 DIAGNOSIS — I6381 Other cerebral infarction due to occlusion or stenosis of small artery: Secondary | ICD-10-CM | POA: Diagnosis not present

## 2024-11-11 DIAGNOSIS — I6389 Other cerebral infarction: Secondary | ICD-10-CM

## 2024-11-11 DIAGNOSIS — Z950 Presence of cardiac pacemaker: Secondary | ICD-10-CM

## 2024-11-11 DIAGNOSIS — G8929 Other chronic pain: Secondary | ICD-10-CM | POA: Diagnosis not present

## 2024-11-11 DIAGNOSIS — I10 Essential (primary) hypertension: Secondary | ICD-10-CM | POA: Diagnosis not present

## 2024-11-11 DIAGNOSIS — I7090 Unspecified atherosclerosis: Secondary | ICD-10-CM | POA: Diagnosis not present

## 2024-11-11 DIAGNOSIS — I441 Atrioventricular block, second degree: Secondary | ICD-10-CM | POA: Diagnosis not present

## 2024-11-11 LAB — ECHOCARDIOGRAM COMPLETE
AR max vel: 2.74 cm2
AV Area VTI: 2.87 cm2
AV Area mean vel: 2.55 cm2
AV Mean grad: 2 mmHg
AV Peak grad: 3.9 mmHg
Ao pk vel: 0.99 m/s
Area-P 1/2: 3.53 cm2
Calc EF: 68 %
Height: 63 in
S' Lateral: 2.2 cm
Single Plane A2C EF: 62.6 %
Single Plane A4C EF: 68.1 %
Weight: 2447.99 [oz_av]

## 2024-11-11 LAB — BASIC METABOLIC PANEL WITH GFR
Anion gap: 10 (ref 5–15)
BUN: 17 mg/dL (ref 8–23)
CO2: 26 mmol/L (ref 22–32)
Calcium: 9.1 mg/dL (ref 8.9–10.3)
Chloride: 103 mmol/L (ref 98–111)
Creatinine, Ser: 1.06 mg/dL — ABNORMAL HIGH (ref 0.44–1.00)
GFR, Estimated: 54 mL/min — ABNORMAL LOW
Glucose, Bld: 95 mg/dL (ref 70–99)
Potassium: 3.8 mmol/L (ref 3.5–5.1)
Sodium: 139 mmol/L (ref 135–145)

## 2024-11-11 MED ORDER — HYDROMORPHONE HCL 2 MG PO TABS
1.0000 mg | ORAL_TABLET | Freq: Once | ORAL | Status: AC
  Administered 2024-11-11: 1 mg via ORAL
  Filled 2024-11-11: qty 1

## 2024-11-11 MED ORDER — ALUM & MAG HYDROXIDE-SIMETH 200-200-20 MG/5ML PO SUSP
15.0000 mL | ORAL | Status: DC | PRN
  Administered 2024-11-11: 15 mL via ORAL
  Filled 2024-11-11: qty 30

## 2024-11-11 MED ORDER — LIDOCAINE 4 % EX CREA
TOPICAL_CREAM | Freq: Two times a day (BID) | CUTANEOUS | Status: DC | PRN
  Filled 2024-11-11: qty 5

## 2024-11-11 MED ORDER — GABAPENTIN 300 MG PO CAPS
300.0000 mg | ORAL_CAPSULE | Freq: Three times a day (TID) | ORAL | Status: DC
  Administered 2024-11-11 – 2024-11-14 (×9): 300 mg via ORAL
  Filled 2024-11-11 (×9): qty 1

## 2024-11-11 MED ORDER — CAPSAICIN 0.075 % EX CREA
TOPICAL_CREAM | Freq: Two times a day (BID) | CUTANEOUS | Status: DC
  Filled 2024-11-11: qty 57

## 2024-11-11 MED ORDER — AMLODIPINE BESYLATE 5 MG PO TABS
5.0000 mg | ORAL_TABLET | Freq: Every day | ORAL | Status: DC
  Administered 2024-11-11 – 2024-11-14 (×4): 5 mg via ORAL
  Filled 2024-11-11 (×4): qty 1

## 2024-11-11 NOTE — Progress Notes (Signed)
 STROKE TEAM PROGRESS NOTE   INTERIM HISTORY/SUBJECTIVE Seen in room with no family at the bedside.  Patient states her speech has recovered back to her baseline.  She wants to go home.  MRI scan shows fairly large basal ganglia infarct.  And echocardiogram are pending.  Vital signs stable.  OBJECTIVE  CBC    Component Value Date/Time   WBC 7.0 11/10/2024 0133   RBC 4.15 11/10/2024 0133   HGB 12.3 11/10/2024 0133   HCT 36.5 11/10/2024 0133   PLT 246 11/10/2024 0133   MCV 88.0 11/10/2024 0133   MCV 95.6 10/07/2013 0903   MCH 29.6 11/10/2024 0133   MCHC 33.7 11/10/2024 0133   RDW 13.4 11/10/2024 0133   LYMPHSABS 1.1 11/09/2024 1029   MONOABS 1.0 11/09/2024 1029   EOSABS 0.0 11/09/2024 1029   BASOSABS 0.0 11/09/2024 1029    BMET    Component Value Date/Time   NA 139 11/11/2024 0152   NA 140 05/06/2020 1435   K 3.8 11/11/2024 0152   CL 103 11/11/2024 0152   CO2 26 11/11/2024 0152   GLUCOSE 95 11/11/2024 0152   BUN 17 11/11/2024 0152   BUN 9 05/06/2020 1435   CREATININE 1.06 (H) 11/11/2024 0152   CREATININE 0.92 10/07/2013 0859   CALCIUM  9.1 11/11/2024 0152   GFRNONAA 54 (L) 11/11/2024 0152    IMAGING past 24 hours MR BRAIN WO CONTRAST Result Date: 11/11/2024 EXAM: MRI BRAIN WITHOUT CONTRAST 11/11/2024 10:10:00 AM TECHNIQUE: Multiplanar multisequence MRI of the head/brain was performed without the administration of intravenous contrast. COMPARISON: CT head and CTA head and neck 11/09/2024. Brain MRI 10/19/2022. CLINICAL HISTORY: 79 year old female. Code strip presentation 2 days ago with evidence of left basal ganglia infarct. FINDINGS: BRAIN AND VENTRICLES: Confluent abnormal diffusion throughout the left basal ganglia which is mostly restricted. No convincing hemorrhage associated (dilated perivascular spaces). Cytotoxic edema with minor regional mass effect. No midline shift. No other diffusion abnormality. Scattered chronic cerebral white matter T2 and FLAIR hyperintensity  elsewhere, including in the right corona radiata and not significantly changed there from 2023 MRI. No cortical encephalomalacia or chronic cerebral blood products identified. Chronic lacunar infarct left thalamus was acute in 2023. Negative brainstem and cerebellum. No mass. No hydrocephalus. The sella is unremarkable. Normal flow voids. ORBITS: No significant abnormality. SINUSES AND MASTOIDS: No significant abnormality. BONES AND SOFT TISSUES: Normal marrow signal. No significant soft tissue abnormality. Negative for age visible cervical spine. IMPRESSION: 1. Acute to subacute Left basal ganglia infarct with no significant hemorrhagic transformation or mass effect. 2. Chronic small vessel disease, otherwise not significantly progressed since 2023. Electronically signed by: Helayne Hurst MD MD 11/11/2024 10:42 AM EST RP Workstation: [id number]    Vitals:   11/11/24 0012 11/11/24 0328 11/11/24 0847 11/11/24 1133  BP: (!) 141/87 (!) 148/87 (!) 186/94 (!) 164/88  Pulse: 72 60 80 64  Resp: 18 18 19 18   Temp: 98.2 F (36.8 C) (!) 97.3 F (36.3 C) 97.6 F (36.4 C) (!) 97.5 F (36.4 C)  TempSrc: Oral Oral  Oral  SpO2: 97% 98% 96% 98%  Weight:      Height:         PHYSICAL EXAM General:  Alert, mildly obese elderly lady patient in no acute distress Psych:  Mood and affect appropriate for situation CV: Regular rate and rhythm on monitor Respiratory:  Regular, unlabored respirations on room air GI: Abdomen soft and nontender   NEURO:  Mental Status: AA&Ox3, patient is able to  give clear and coherent history Speech/Language: speech is minimally dysarthric and slow.  But no definite aphasia.  Naming, repetition, fluency, and comprehension intact.  Cranial Nerves:  II: PERRL. Visual fields full.  III, IV, VI: EOMI. Eyelids elevate symmetrically.  V: Sensation is intact to light touch and symmetrical to face.  VII: Face is symmetrical resting and smiling VIII: hearing intact to voice. IX, X:  Palate elevates symmetrically. Phonation is normal.  KP:Dynloizm shrug 5/5. XII: tongue is midline without fasciculations. Motor: Slightly diminished fine finger movements on the right and trace right grip weakness.  Orbits left over right upper extremity.  Trace weakness of right hip flexors. Tone: is normal and bulk is normal Sensation- Intact to light touch bilaterally. Extinction absent to light touch to DSS.   Coordination: FTN intact bilaterally, HKS: no ataxia in BLE.No drift.  Gait- deferred        ASSESSMENT/PLAN  [name] is a 79 y.o. female with history of HTN, Mobitiz type 2, prior CVA, GERD, CAD, B12 deficiency and fatty liver who presents to [facility] ED via EMS as a Code stroke for acute onset of right side weakness, right facial droop and dysarthria. NIH on Admission 5.   Acute Ischemic Infarct:  left basal ganglia  Etiology:  Likely Atherosclerotic (HLD, intracranial atherosclerosis age) Code Stroke CT head - left basal ganglia infarct, ASPECTs 8 CTA head & neck No LVO. There is scattered intracranial atherosclerosis. small area of possible intramural hypodensity of left ICA per my read - discussed with radiology who agreed this was artifact rather than thrombus.  MRI   large left basal ganglia infarct with mild cytotoxic edema and regional mass effect.  No midline shift. Carotid Doppler  na 2D Echo ordered LDL 162 HgbA1c 5.3 VTE prophylaxis - lovenox , SCDs aspirin  81 mg daily prior to admission, now on aspirin  81 mg daily and clopidogrel  75 mg daily for 3 weeks and then asa alone. 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  MRI confirms a fairly large basal ganglia infarct likely of cryptogenic etiology.  Recommend interrogate pacemaker to look for paroxysmal A-fib.  Aspirin  and Plavix  for 3 weeks followed by aspirin  alone.  Aggressive risk factor modification.  Mobilize out of bed.  Transfer to inpatient rehab when bed available.    I personally spent a total of 35 minutes in the care of the patient today including getting/reviewing separately obtained history, performing a medically appropriate exam/evaluation, counseling and educating, placing orders, referring and communicating with other health care professionals, documenting clinical information in the EHR, independently interpreting results, and coordinating care.        Eather Popp, MD       To contact Stroke Continuity provider, please refer to [email or web address]. After hours, contact General Neurology

## 2024-11-11 NOTE — Plan of Care (Signed)

## 2024-11-11 NOTE — Progress Notes (Addendum)
 PT Cancellation Note  Patient Details Name: Kayla Terry MRN: [id number] DOB: [birth year]   Cancelled Treatment:    Reason Eval/Treat Not Completed: Medical issues which prohibited therapy  Patient's BP currently 186/94 (goal <160 per orders). RN aware. Will see patient once BP comes down.   Addendum 11:00--pt off the floor. Will continue attempts.    Macario RAMAN, PT Acute Rehabilitation Services  Office [phone]  Macario SHAUNNA Soja 11/11/2024, 9:00 AM

## 2024-11-11 NOTE — Plan of Care (Signed)
" °  Problem: Education: Goal: Knowledge of secondary prevention will improve (MUST DOCUMENT ALL) Outcome: Progressing   Problem: Ischemic Stroke/TIA Tissue Perfusion: Goal: Complications of ischemic stroke/TIA will be minimized Outcome: Progressing   Problem: Coping: Goal: Will verbalize positive feelings about self Outcome: Progressing   Problem: Self-Care: Goal: Ability to participate in self-care as condition permits will improve Outcome: Progressing   Problem: Clinical Measurements: Goal: Ability to maintain clinical measurements within normal limits will improve Outcome: Progressing   Problem: Clinical Measurements: Goal: Will remain free from infection Outcome: Progressing   Problem: Health Behavior/Discharge Planning: Goal: Ability to manage health-related needs will improve Outcome: Progressing   Problem: Elimination: Goal: Will not experience complications related to urinary retention Outcome: Progressing   Problem: Elimination: Goal: Will not experience complications related to bowel motility Outcome: Progressing   Problem: Coping: Goal: Level of anxiety will decrease Outcome: Progressing   Problem: Skin Integrity: Goal: Risk for impaired skin integrity will decrease Outcome: Progressing   Problem: Pain Managment: Goal: General experience of comfort will improve and/or be controlled Outcome: Progressing   "

## 2024-11-11 NOTE — Progress Notes (Signed)
 Transfer to MRI accompanied by Swot nurse and transporter.

## 2024-11-11 NOTE — TOC CAGE-AID Note (Signed)
 Transition of Care [facility]) - CAGE-AID Screening   Patient Details  Name: Kayla Terry MRN: [id number] Date of Birth: July 08, 1946  Transition of Care [facility]) CM/SW Contact:    Cinda Hara E Kathalene Sporer, LCSW Phone Number: 11/11/2024, 10:08 AM   Clinical Narrative: No SA noted.   CAGE-AID Screening:    Have You Ever Felt You Ought to Cut Down on Your Drinking or Drug Use?: No Have People Annoyed You By Critizing Your Drinking Or Drug Use?: No Have You Felt Bad Or Guilty About Your Drinking Or Drug Use?: No Have You Ever Had a Drink or Used Drugs First Thing In The Morning to Steady Your Nerves or to Get Rid of a Hangover?: No CAGE-AID Score: 0  Substance Abuse Education Offered: No

## 2024-11-11 NOTE — Progress Notes (Signed)
" °  Device system confirmed to be MRI conditional, with implant date > 6 weeks ago, and no evidence of abandoned or epicardial leads in review of most recent CXR  Device last cleared by EP Provider: [place] on 11/11/24  Clearance is good through for 1 year as long as parameters remain stable at time of check. If pt undergoes a cardiac device procedure during that time, they should be re-cleared.   Tachy-therapies to be programmed off if applicable with device back to pre-MRI settings after completion of exam.  Medtr[employer] - Programming recommendation received through [employer] App/Tablet  Kayla Terry, RT  11/11/2024 10:11 AM     "

## 2024-11-11 NOTE — Progress Notes (Addendum)
 "  HD#2 SUBJECTIVE:  Patient Summary: Kayla Terry is a 79 y.o. with a pertinent PMH of T2DM Mobitz s/p PPM, prior CVA in 2023 without residual deficits, chronic pain due to spinal stenosis, and HTN, who presented with new dysarthria. Code stroke was called and patient was found to have subacute left basal ganglia infarct on CT and admitted for further workup.  Overnight Events: Patient was evaluated for CP described as sharp, substernal and occassionally radiated to bilateral armpits about 30-45 min after eating lunch. Suspected GERD. Workup for ACS negative. Additionally had generalized body pain, 1 mg PO dilaudid  given.   Interim History: States that she's not feeling any worse or better overall today. She does think that her speech has improved. She endorses dizziness with her head turning to the right and if she were to stand up. She thinks that her strength and weakness is the same, mostly right-sided. She reports that her sensation feels the same. She reports some pain in her stomach, legs and feet. She denies a BM today, but did have a BM yesterday. She does report some burning that has alleviated some after receiving the medication.   OBJECTIVE:  Vital Signs: Vitals:   11/10/24 1943 11/10/24 2050 11/11/24 0012 11/11/24 0328  BP: (!) 142/73  (!) 141/87 (!) 148/87  Pulse: (!) 59  72 60  Resp: 18  18 18   Temp: 98.1 F (36.7 C)  98.2 F (36.8 C) (!) 97.3 F (36.3 C)  TempSrc: Oral  Oral Oral  SpO2: 96%  97% 98%  Weight:      Height:  5' 3 (1.6 m)     Supplemental O2: Room Air SpO2: 98 %  Filed Weights   11/09/24 1000  Weight: 69.4 kg    No intake or output data in the 24 hours ending 11/11/24 0655 Net IO Since Admission: 30 mL [11/11/24 0655]  Physical Exam: Physical Exam Constitutional:      General: She is not in acute distress.    Appearance: She is not ill-appearing, toxic-appearing or diaphoretic.  Pulmonary:     Effort: No respiratory distress.   Musculoskeletal:     Right lower leg: No edema.     Left lower leg: No edema.  Skin:    General: Skin is warm and dry.  Neurological:     Mental Status: She is alert.     Sensory: No sensory deficit.     Motor: No weakness.   Further physical exam deferred. Patient had to be transported to MRI. Will f/u.   Patient Lines/Drains/Airways Status     Active Line/Drains/Airways     Name Placement date Placement time Site Days   Peripheral IV 11/09/24 18 G Anterior;Distal;Left;Upper Arm 11/09/24  --  Arm  2   Peripheral IV 11/09/24 18 G Anterior;Distal;Right;Upper Arm 11/09/24  --  Arm  2            Pertinent labs and imaging:      Latest Ref Rng & Units 11/10/2024    1:33 AM 11/09/2024   10:34 AM 11/09/2024   10:29 AM  CBC  WBC 4.0 - 10.5 K/uL 7.0   12.0   Hemoglobin 12.0 - 15.0 g/dL 87.6  87.3  86.9   Hematocrit 36.0 - 46.0 % 36.5  37.0  39.6   Platelets 150 - 400 K/uL 246   265        Latest Ref Rng & Units 11/11/2024    1:52 AM 11/10/2024    1:33  AM 11/09/2024   10:34 AM  CMP  Glucose 70 - 99 mg/dL 95  95  96   BUN 8 - 23 mg/dL 17  15  16    Creatinine 0.44 - 1.00 mg/dL 8.93  9.08  8.99   Sodium 135 - 145 mmol/L 139  140  141   Potassium 3.5 - 5.1 mmol/L 3.8  3.3  3.5   Chloride 98 - 111 mmol/L 103  101  103   CO2 22 - 32 mmol/L 26  28    Calcium  8.9 - 10.3 mg/dL 9.1  9.1      No results found.  ASSESSMENT/PLAN:  Assessment: Principal Problem:   Acute stroke due to ischemia [facility]) Active Problems:   Benign essential hypertension   Hyperlipidemia   Restless leg syndrome   Leukocytosis   Spinal stenosis of lumbar region   Second degree AV block, Mobitz type II   History of stroke   Cerebrovascular accident (CVA) (HCC)   Plan: #subacute left basal ganglia infarct  #mild mass effect 2/2 CVA Previously on ASA 81 mg at home. Deficits: dysarthria, pronator drift in RUE, Stroke team following. CC with OT is dizziness. MRI brain without contrast: acute to  subacute chronic infarct to left basal ganglia without hemorrhagic transformation or mass effect. Chronic microvascular changes.  - stroke team following.  - continue DAPT with ASA 81 mg and clopidogrel  75 mg daily for 3 weeks then ASA alone.  - frequent neuro-checks.  - Risk factors:   - A1C 5.3, at goal  - LDL goal <70, current LDL 837 continue rosuvastatin  40 mg  - Echo: scheduled for 1/12 at 11:00  - PT/OT consulted. Recommending CIR.   #HTN #Dizziness  Was on permissive HTN and holding BP meds. BP 140s / 85s. Home medications include: amlodipine  5 mg and losartan  50 mg (which patient was taking PRN for high BP). BP at that range not uncommon in hospital setting, however now outside window of permissive HTN, will restart amlodipine . Given dizziness, will be cautious to not lower too quickly. Dizziness is described as more positional, likely BPPV as mostly associated with head turning right, but patient does endorse dizziness with standing and is noted by OT, so will be cautious if 2/2 orthostatics. If dizziness worsens, will discontinue amlodipine .  - restarted amlodipine  5 mg daily   #Chronic pain  Home medications: gabapentin  300-600 mg TID, hydrocodone -acetaminophen  5-325 mg BID PRN. This Am, stated legs were in pain. Had not received PRN gabapentin  yet.  - Gapapentin 300 mg BID PRN ordered  - hydrocodone -acetaminophen  5-325 mg BID PRN  #Chest Pain  Likely 2/2 GERD. ACS workup negative. Still complaining of mild stomach burning. Last BM yesterday.  - continue famotidine  20 mg daily  - Maalox ordered  #Insomnia  Home medications: Trazodone  100 mg PRN and Zolpidem  10 mg at bedtime. Decreased dose given age. Trazodone  could not be confirmed by family.  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 Neurology was informed. MRI 1/12   Best  Practice: Diet: Regular diet VTE: SCDs Start: 11/09/24 1316 enoxaparin  (LOVENOX ) injection 30 mg Start: 11/09/24 1300 Code: DNR-limited   Disposition planning: Therapy Recs: CIR Family Contact: daughter DISPO: Anticipated discharge pending CIR eval  Signature:  Caron Tardif [facility]  6:55 AM, 11/11/2024  On Call pager [phone]  "

## 2024-11-11 NOTE — Progress Notes (Addendum)
 Inpatient Rehab Admissions Coordinator:  Consult received. Pt currently about to undergo a procedure. Will f/u at later date.   Saw pt and daughter Darice at bedside. Discussed average length of stay, insurance authorization requirement and discharge home after completion of CIR. Both acknowledged understanding. Pt unsure if she would like to pursue CIR. Darice is supportive of pt pursuing CIR if she chooses to do so. Pt would like to think about rehab options prior to making a decision regarding CIR. Will continue to follow.  Tinnie Yvone Cohens, MS, CCC-SLP Admissions Coordinator [phone]

## 2024-11-11 NOTE — Progress Notes (Signed)
 SLP Cancellation Note  Patient Details Name: Kayla Terry MRN: [id number] DOB: [birth year]   Cancelled treatment:        Attempted to conduct Speech-Language evaluation, however patient out of room for MRI. Will reattempt in the afternoon.   Rosina LABOR Lakenzie Mcclafferty 11/11/2024, 11:55 AM

## 2024-11-11 NOTE — TOC Initial Note (Signed)
 Transition of Care [facility]) - Initial/Assessment Note    Patient Details  Name: Kayla Terry MRN: [id number] Date of Birth: [birth year]  [facility]) CM/SW Contact:    Andrez JULIANNA George, RN Phone Number: 11/11/2024, 11:29 AM  Clinical Narrative:                  Kayla Terry is a 79 year old female with a past medical history of Type II Mobitz s/p PPM, prior CVA in 2023 without residual deficits, chronic pain due to spinal stenosis, hypertension that presents today after developing new dysarthria.   Pt is from home alone. Her daughter and grandson check in on her.  She was driving some but family assists.  She was managing her own medications.   Current recommendations are for CIR. Awaiting eval.  IP Care management following.  Expected Discharge Plan: IP Rehab Facility Barriers to Discharge: Continued Medical Work up   Patient Goals and CMS Choice   CMS Medicare.gov Compare Post Acute Care list provided to:: Patient Represenative (must comment) Choice offered to / list presented to : Adult Children      Expected Discharge Plan and Services   Discharge Planning Services: CM Consult Post Acute Care Choice: IP Rehab Living arrangements for the past 2 months: Single Family Home                                      Prior Living Arrangements/Services Living arrangements for the past 2 months: Single Family Home Lives with:: Self Patient language and need for interpreter reviewed:: Yes          Care giver support system in place?: Yes (comment) Current home services: DME (cane/ rollator/ shower seat) Criminal Activity/Legal Involvement Pertinent to Current Situation/Hospitalization: No - Comment as needed  Activities of Daily Living   ADL Screening (condition at time of admission) Is the patient deaf or have difficulty hearing?: Yes Does the patient have difficulty seeing, even when wearing glasses/contacts?: Yes Does the patient have difficulty  concentrating, remembering, or making decisions?: Yes  Permission Sought/Granted                  Emotional Assessment Appearance:: Appears stated age   Affect (typically observed): Quiet Orientation: : Oriented to Self, Oriented to Place, Oriented to Situation   Psych Involvement: No (comment)  Admission diagnosis:  Cerebrovascular accident (CVA), unspecified mechanism (HCC) [I63.9] Acute stroke due to ischemia [facility]) [I63.9] Patient Active Problem List   Diagnosis Date Noted   Cerebrovascular accident (CVA) (HCC) 11/10/2024   History of stroke 11/09/2024   Second degree AV block, Mobitz type II 04/04/2024   Pain of left heel 02/06/2024   Spinal stenosis of lumbar region 08/15/2023   Benign neoplasm of colon 01/17/2023   Acute stroke due to ischemia (HCC) 10/21/2022   Diverticulitis of colon 10/19/2022   Leukocytosis 10/19/2022   Abdominal pain 08/02/2022   Hypertensive urgency 08/01/2022   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/06/2022   COVID-19 virus infection 04/23/2021   Coronary artery disease 04/23/2021   Chest pain 04/22/2021   Chest pain of uncertain etiology    Nonspecific abnormal electrocardiogram (ECG) (EKG)    2nd degree AV block 03/25/2021   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   B12 deficiency 05/07/2020   Muscle weakness 05/06/2020   Benign essential hypertension 06/08/2017   Acid reflux disease 03/08/2016  Hyperlipidemia 03/08/2016   Nocturia 03/08/2016   Restless leg syndrome 03/08/2016   Chest tightness 08/21/2012   PCP:  Macarthur Elouise SQUIBB, DO Pharmacy:   [facility] [phone] - [place], [place] - [address] AT [facility] [address] [place] [address] Phone: [phone] Fax: [phone]     Social Drivers of Health (SDOH) Social History: SDOH Screenings   Food Insecurity: No Food Insecurity (11/09/2024)  Housing: Low Risk (11/09/2024)   Transportation Needs: No Transportation Needs (11/09/2024)  Utilities: Not At Risk (11/09/2024)  Social Connections: Unknown (11/09/2024)  Tobacco Use: Low Risk (11/10/2024)   SDOH Interventions:     Readmission Risk Interventions    10/21/2022   11:53 AM  Readmission Risk Prevention Plan  Post Dischage Appt Complete  Medication Screening Complete  Transportation Screening Complete

## 2024-11-11 NOTE — Progress Notes (Signed)
 PT Cancellation Note  Patient Details Name: Kayla Terry MRN: [id number] DOB: 1945/11/05   Cancelled Treatment:    Reason Eval/Treat Not Completed: Patient at procedure or test/unavailable  Attempted 12:45 with pt just starting lunch. Currently pt undergoing another procedure. Will continue efforts.    Macario RAMAN, PT Acute Rehabilitation Services  Office [phone]  Macario SHAUNNA Soja 11/11/2024, 1:19 PM

## 2024-11-12 DIAGNOSIS — Z950 Presence of cardiac pacemaker: Secondary | ICD-10-CM | POA: Diagnosis not present

## 2024-11-12 DIAGNOSIS — I7090 Unspecified atherosclerosis: Secondary | ICD-10-CM | POA: Diagnosis not present

## 2024-11-12 DIAGNOSIS — G8929 Other chronic pain: Secondary | ICD-10-CM | POA: Diagnosis not present

## 2024-11-12 DIAGNOSIS — Z7982 Long term (current) use of aspirin: Secondary | ICD-10-CM | POA: Diagnosis not present

## 2024-11-12 DIAGNOSIS — G4700 Insomnia, unspecified: Secondary | ICD-10-CM | POA: Diagnosis not present

## 2024-11-12 DIAGNOSIS — I441 Atrioventricular block, second degree: Secondary | ICD-10-CM

## 2024-11-12 DIAGNOSIS — G936 Cerebral edema: Secondary | ICD-10-CM | POA: Diagnosis not present

## 2024-11-12 DIAGNOSIS — R079 Chest pain, unspecified: Secondary | ICD-10-CM | POA: Diagnosis not present

## 2024-11-12 DIAGNOSIS — I6389 Other cerebral infarction: Secondary | ICD-10-CM | POA: Diagnosis not present

## 2024-11-12 DIAGNOSIS — E785 Hyperlipidemia, unspecified: Secondary | ICD-10-CM | POA: Diagnosis not present

## 2024-11-12 DIAGNOSIS — I10 Essential (primary) hypertension: Secondary | ICD-10-CM | POA: Diagnosis not present

## 2024-11-12 DIAGNOSIS — I639 Cerebral infarction, unspecified: Secondary | ICD-10-CM | POA: Diagnosis not present

## 2024-11-12 LAB — CBC
HCT: 39.4 % (ref 36.0–46.0)
Hemoglobin: 13.4 g/dL (ref 12.0–15.0)
MCH: 29.8 pg (ref 26.0–34.0)
MCHC: 34 g/dL (ref 30.0–36.0)
MCV: 87.6 fL (ref 80.0–100.0)
Platelets: 276 K/uL (ref 150–400)
RBC: 4.5 MIL/uL (ref 3.87–5.11)
RDW: 13.2 % (ref 11.5–15.5)
WBC: 7.4 K/uL (ref 4.0–10.5)
nRBC: 0 % (ref 0.0–0.2)

## 2024-11-12 LAB — BASIC METABOLIC PANEL WITH GFR
Anion gap: 10 (ref 5–15)
BUN: 16 mg/dL (ref 8–23)
CO2: 26 mmol/L (ref 22–32)
Calcium: 9.2 mg/dL (ref 8.9–10.3)
Chloride: 102 mmol/L (ref 98–111)
Creatinine, Ser: 0.94 mg/dL (ref 0.44–1.00)
GFR, Estimated: >60 mL/min
Glucose, Bld: 100 mg/dL — ABNORMAL HIGH (ref 70–99)
Potassium: 3.7 mmol/L (ref 3.5–5.1)
Sodium: 139 mmol/L (ref 135–145)

## 2024-11-12 MED ORDER — ORAL CARE MOUTH RINSE: 15.0000 mL | OROMUCOSAL | Status: DC | PRN

## 2024-11-12 MED ORDER — ENOXAPARIN SODIUM 40 MG/0.4ML IJ SOSY
40.0000 mg | PREFILLED_SYRINGE | INTRAMUSCULAR | Status: DC
  Administered 2024-11-12 – 2024-11-14 (×3): 40 mg via SUBCUTANEOUS
  Filled 2024-11-12 (×4): qty 0.4

## 2024-11-12 NOTE — Progress Notes (Signed)
 Physical Therapy Treatment Patient Details Name: Kayla Terry MRN: [id number] DOB: September 01, 1946 Today's Date: 11/12/2024   History of Present Illness 79 year old female presents 1/10 after developing new dysarthria. 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    PT Comments  Vestibular assessment completed as pt shows signs of dizziness (grasps mattress upon sitting, closes eyes) and endorses dizziness although with difficulty describing sensation due to aphasia. All vestibular testing negative eventhough all movements appeared to cause her dizziness/distress. (See below for details of vestibular assessment). Able to progress pt to ambulating 25 ft with RW with min assist for stability. Patient with poor Rt foot clearance, shortened step length, and reports feeling her knees feel wobbly. Continue to feel pt can benefit from continued inpatient follow up therapy, >3 hours/day     If plan is discharge home, recommend the following: A little help with walking and/or transfers;A little help with bathing/dressing/bathroom;Assistance with cooking/housework;Assist for transportation;Help with stairs or ramp for entrance;Supervision due to cognitive status   Can travel by private vehicle        Equipment Recommendations  None recommended by PT    Recommendations for Other Services       Precautions / Restrictions Precautions Precautions: Fall Recall of Precautions/Restrictions: Impaired Restrictions Weight Bearing Restrictions Per Provider Order: No      11/12/24 0916  Vestibular Assessment  General Observation pt appears dizzy with changes in position (tends to close eyes although states it does not make the dizziness any better)  Symptom Behavior  Subjective history of current problem Per daughter, pt had issues with  dizziness PTA as well. Would be walking and have to sit down  Type of Dizziness  Comment (pt with aphasia and difficulty describing; answers yes to both spinning and lightheadedness)  Duration of Dizziness minutes?  Symptom Nature Motion provoked  Aggravating Factors Activity in general  Relieving Factors No known relieving factors  Progression of Symptoms Worse  History of similar episodes unclear if dizziness is different than PTA  Oculomotor Exam  Oculomotor Alignment Normal  Ocular ROM WNL  Spontaneous Absent  Gaze-induced  Absent  Smooth Pursuits Intact  Vestibulo-Ocular Reflex  VOR 1 Head Only (x 1 viewing) pt unable to understand instructions  VOR to Slow Head Movement Normal  Comment HIT negative bil  Positional Testing  Sidelying Test Sidelying Right;Sidelying Left  Sidelying Right  Sidelying Right Duration 0  Sidelying Right Symptoms No nystagmus  Sidelying Left  Sidelying Left Duration 0  Sidelying Left Symptoms No nystagmus   Mobility  Bed Mobility Overal bed mobility: Needs Assistance Bed Mobility: Supine to Sit, Sit to Supine     Supine to sit: Contact guard Sit to supine: Supervision   General bed mobility comments: CGA for safety due to apparent dizziness (leans to rt, grabs edge of mattress, closes eyes),    Transfers Overall transfer level: Needs assistance Equipment used: Rolling walker (2 wheels), None Transfers: Sit to/from Stand Sit to Stand: Min assist           General transfer comment: Min assist for boost and balance to rise from EOB    Ambulation/Gait Ambulation/Gait assistance: Min assist Gait Distance (Feet): 25 Feet Assistive device: Rolling walker (2 wheels) Gait Pattern/deviations: Step-through pattern, Decreased stride length, Shuffle, Narrow base of support, Decreased dorsiflexion - right   Gait velocity  interpretation: <1.8 ft/sec, indicate of risk for recurrent falls   General Gait Details: pt naturally focusing eyes  on target in front of her; symptoms of dizziness appeared less while walking (even with turning); poor Rt foot clearance   Stairs             Wheelchair Mobility     Tilt Bed    Modified Rankin (Stroke Patients Only) Modified Rankin (Stroke Patients Only) Pre-Morbid Rankin Score: No symptoms Modified Rankin: Moderately severe disability     Balance Overall balance assessment: Needs assistance Sitting-balance support: Feet supported, Bilateral upper extremity supported Sitting balance-Leahy Scale: Poor Sitting balance - Comments: CGA eob with bil UE support due to dizziness   Standing balance support: During functional activity, Bilateral upper extremity supported, Reliant on assistive device for balance Standing balance-Leahy Scale: Poor                              Communication Communication Communication: Impaired Factors Affecting Communication: Difficulty expressing self;Reduced clarity of speech  Cognition Arousal: Alert Behavior During Therapy: WFL for tasks assessed/performed   PT - Cognitive impairments: Difficult to assess Difficult to assess due to: Impaired communication                       Following commands: Impaired Following commands impaired: Only follows one step commands consistently    Cueing Cueing Techniques: Verbal cues, Gestural cues, Tactile cues  Exercises      General Comments General comments (skin integrity, edema, etc.): Daughter present and hopeful for AIR prior to discharge home      Pertinent Vitals/Pain Pain Assessment Pain Assessment: No/denies pain    Home Living                          Prior Function            PT Goals (current goals can now be found in the care plan section) Acute Rehab PT Goals Patient Stated Goal: get well Time For Goal Achievement: 11/23/24 Potential to Achieve Goals: Good Progress towards PT goals: Progressing toward goals    Frequency    Min  3X/week      PT Plan      Co-evaluation              AM-PAC PT 6 Clicks Mobility   Outcome Measure  Help needed turning from your back to your side while in a flat bed without using bedrails?: A Little Help needed moving from lying on your back to sitting on the side of a flat bed without using bedrails?: A Little Help needed moving to and from a bed to a chair (including a wheelchair)?: A Little Help needed standing up from a chair using your arms (e.g., wheelchair or bedside chair)?: A Little Help needed to walk in hospital room?: A Little Help needed climbing 3-5 steps with a railing? : A Lot 6 Click Score: 17    End of Session Equipment Utilized During Treatment: Gait belt Activity Tolerance: Patient tolerated treatment well Patient left: with call bell/phone within reach;with family/visitor present;in bed;with bed alarm set Nurse Communication: Mobility status PT Visit Diagnosis: Unsteadiness on feet (R26.81);Other abnormalities of gait and mobility (R26.89);Other symptoms and signs involving the nervous system (R29.898);Difficulty in walking, not elsewhere classified (R26.2)     Time: 9147-9088 PT Time Calculation (min) (ACUTE ONLY): 19 min  Charges:    $  Neuromuscular Re-education: 8-22 mins PT General Charges $$ ACUTE PT VISIT: 1 Visit                      Macario RAMAN, PT Acute Rehabilitation Services  Office [phone]    Macario SHAUNNA Soja 11/12/2024, 9:28 AM

## 2024-11-12 NOTE — PMR Pre-admission (Signed)
 PMR Admission Coordinator Pre-Admission Assessment  Patient: Kayla Terry is an 79 y.o., female MRN: [id number] DOB: 03-20-1946 Height: 5' 3 (160 cm) Weight: 69.4 kg  Insurance Information HMO: yes    PPO:      PCP:      IPA:      80/20:      OTHER:  PRIMARY: BCBS Medicare      Policy#: [id number]      Subscriber: pt CM Name: Harlene      Phone#: [phone]     Fax#: [phone] Pre-Cert#: [id number]  auth for CIR from [place] with BCBS Medicare for admit 11/13/24 with next review date tbd. Updates due to  fax listed above.        Employer:  Benefits:  Phone #: [phone]     Name: 1/13 Eff. Date: 10/31/24     Deduct: none      Out of Pocket Max: $4900      Life Max: none CIR: $400 co pay per day days 1 until 6      SNF: no co pay per day days 1 until 20; $218 co pay per day days 21 until 60 Outpatient: $15 per visit     Co-Pay:  Home Health: 100%      Co-Pay:  DME: 80%     Co-Pay: 20% Providers: in network  SECONDARY: none      Policy#:      Phone#:   Artist:       Phone#:   The Best Boy for patients in Inpatient Rehabilitation Facilities with attached Privacy Act Statement-Health Care Records was provided and verbally reviewed with: Patient and Family  Emergency Contact Information Contact Information     Name Relation Home Work Mobile   [place] [phone]  [phone]   [place], [place] [phone]  [phone]   Freida Pao Daughter   [phone]      Other Contacts   None on File    Current Medical History  Patient Admitting Diagnosis: CVA  History of Present Illness:  Kayla Terry is a 79 year old right-handed female with history significant for 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  Reportedly  independent prior to patient driving with rare use of a cane.   Presented 11/09/2024 with acute onset of right sided weakness right facial droop and dysarthria.  Cranial CT scan showed left basal ganglia subacute lacunar infarct, mild mass effect, no hemorrhage.  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  CTA showed no large vessel occlusion with intracranial atherosclerosis with some progression since 2023 (up to moderate left ACA A2, left MCA distal posterior).  Patient did not receive TNK.  MRI follow-up acute to subacute left basal ganglia infarct with no significant hemorrhagic transformation or mass effect.  Admission chemistries unremarkable except WBC of 12,000.  Echocardiogram with ejection fraction of 60 to 65% no wall motion abnormalities.  Interrogation of pacemaker showed no A-fib.  Neurology follow-up placed on aspirin  81 mg daily and Plavix  75 mg daily x 3 weeks then aspirin  alone.  Patient was cleared for Lovenox  for DVT prophylaxis.  Tolerating a regular consistency diet.   Complete NIHSS TOTAL: 1  Patient's medical record from [facility] has been reviewed by the rehabilitation admission coordinator and physician.  Past Medical History  Past Medical History:  Diagnosis Date  Allergy    B12 deficiency 04/2020   Cataract    Coronary artery disease    COVID-19 04/22/2021   Diverticulitis    10/19/2022   Fatty liver    GERD (gastroesophageal reflux disease)    Herpes zoster without complications    Hiatal hernia    Hypertension    Mild mitral regurgitation 2013   Mobitz type 2 second degree atrioventricular block    Pectus excavatum    pectus excavatum deformity resulting in mass effect on the RV by CT 03/2020   Pulmonary nodule    Shingles    Stroke [facility])    10/19/2022   Has the patient had major surgery during 100 days prior to admission? No  Family History   family history includes Alcohol  abuse in her brother; Cancer in her  mother; Leukemia in her mother; Lung cancer in her father; Multiple myeloma in her brother and father; Multiple sclerosis in her sister.  Current Medications Current Medications[1]  Patients Current Diet:  Diet Order             Diet regular Room service appropriate? Yes; Fluid consistency: Thin  Diet effective now                  Precautions / Restrictions Precautions Precautions: Fall Restrictions Weight Bearing Restrictions Per Provider Order: No   Has the patient had 2 or more falls or a fall with injury in the past year? No  Prior Activity Level Limited Community (1-2x/wk): mod I with cane  Prior Functional Level Self Care: Did the patient need help bathing, dressing, using the toilet or eating? Independent  Indoor Mobility: Did the patient need assistance with walking from room to room (with or without device)? Independent  Stairs: Did the patient need assistance with internal or external stairs (with or without device)? Independent  Functional Cognition: Did the patient need help planning regular tasks such as shopping or remembering to take medications? Independent  Patient Information Are you of Hispanic, Latino/a,or Spanish origin?: A. No, not of Hispanic, Latino/a, or Spanish origin What is your race?: A. White Do you need or want an interpreter to communicate with a doctor or health care staff?: 0. No  Patient's Response To:  Health Literacy and Transportation Is the patient able to respond to health literacy and transportation needs?: Yes Health Literacy - How often do you need to have someone help you when you read instructions, pamphlets, or other written material from your doctor or pharmacy?: Never In the past 12 months, has lack of transportation kept you from medical appointments or from getting medications?: No In the past 12 months, has lack of transportation kept you from meetings, work, or from getting things needed for daily living?: No  Home  Assistive Devices / Equipment Home Equipment: Agricultural Consultant (2 wheels), Shower seat, Grab bars - tub/shower, Medical Laboratory Scientific Officer - single point  Prior Device Use: Indicate devices/aids used by the patient prior to current illness, exacerbation or injury? Cane prn  Current Functional Level Cognition  Arousal/Alertness: Awake/alert Overall Cognitive Status: Difficult to assess Orientation Level: Oriented X4 Attention: Sustained Sustained Attention: Appears intact Awareness: Impaired Awareness Impairment: Anticipatory impairment, Emergent impairment Behaviors: Perseveration Safety/Judgment: Appears intact    Extremity Assessment (includes Sensation/Coordination)  Upper Extremity Assessment: Generalized weakness, RUE deficits/detail (generally weak bilaterally) RUE Deficits / Details: decr coordintaion with very FM tasks such as handwriting, but legible, difficulty writing in s straight line without visual cue (line).  Lower Extremity Assessment: Defer to  PT evaluation    ADLs  Overall ADL's : Needs assistance/impaired Eating/Feeding: Set up, Sitting Grooming: Contact guard assist, Standing, Minimal assistance, Cueing for sequencing Grooming Details (indicate cue type and reason): cues to place toothpaste cap back on brush Upper Body Bathing: Set up, Sitting Lower Body Bathing: Minimal assistance, Sit to/from stand Upper Body Dressing : Set up, Sitting Lower Body Dressing: Minimal assistance, Sit to/from stand Toilet Transfer: Minimal assistance, Rolling walker (2 wheels), Ambulation Toileting- Clothing Manipulation and Hygiene: Minimal assistance, Sit to/from stand Functional mobility during ADLs: Minimal assistance, Rolling walker (2 wheels)    Mobility  Overal bed mobility: Needs Assistance Bed Mobility: Sit to Supine Supine to sit: Contact guard, HOB elevated, Used rails Sit to supine: Contact guard assist General bed mobility comments: for safety    Transfers  Overall transfer level:  Needs assistance Equipment used: Rolling walker (2 wheels), None Transfers: Sit to/from Stand Sit to Stand: Min assist Bed to/from chair/wheelchair/BSC transfer type:: Step pivot Step pivot transfers: Min assist General transfer comment: min A for balance    Ambulation / Gait / Stairs / Wheelchair Mobility  Ambulation/Gait Ambulation/Gait assistance: Editor, Commissioning (Feet): 70 Feet Assistive device: Rolling walker (2 wheels) Gait Pattern/deviations: Step-through pattern, Decreased stride length, Shuffle, Narrow base of support, Decreased dorsiflexion - right General Gait Details: reports dizziness upon standing; appeared less apprehensive than previously; decr Rt foot clearance and ran into objects on her rt x 6 Gait velocity: Dec Gait velocity interpretation: 1.31 - 2.62 ft/sec, indicative of limited community ambulator Pre-gait activities: weight shift, static march, poor foot clearance.    Posture / Balance Dynamic Sitting Balance Sitting balance - Comments: statically; pt denying dizziness this session Balance Overall balance assessment: Needs assistance Sitting-balance support: Feet supported, Bilateral upper extremity supported Sitting balance-Leahy Scale: Fair Sitting balance - Comments: statically; pt denying dizziness this session Standing balance support: During functional activity, Bilateral upper extremity supported, Reliant on assistive device for balance Standing balance-Leahy Scale: Poor Standing balance comment: hand held support EOb    Special considerations/life events  Fall precautions   Previous Home Environment  Living Arrangements: Alone  Lives With: Alone Available Help at Discharge: Family, Available 24 hours/day Type of Home: House Home Layout: One level Home Access: Ramped entrance Bathroom Shower/Tub: Associate Professor: Yes Home Care Services: No  Discharge Living Setting Plans for  Discharge Living Setting: Patient's home, House Type of Home at Discharge: House Discharge Home Layout: One level Discharge Home Access: Ramped entrance Discharge Bathroom Shower/Tub: Tub/shower unit Discharge Bathroom Toilet: Standard Discharge Bathroom Accessibility: Yes How Accessible: Accessible via walker Does the patient have any problems obtaining your medications?: No  Social/Family/Support Systems Patient Roles: Parent Contact Information: daughter, Darice Anticipated Caregiver: daughter and son Anticipated Caregiver's Contact Information: see contacts Ability/Limitations of Caregiver: no limitations stated Caregiver Availability: 24/7 Discharge Plan Discussed with Primary Caregiver: Yes Is Caregiver In Agreement with Plan?: Yes Does Caregiver/Family have Issues with Lodging/Transportation while Pt is in Rehab?: No  Goals Patient/Family Goal for Rehab: supervision with PT, OT and SLP Expected length of stay: ELOS 7 days Pt/Family Agrees to Admission and willing to participate: Yes Program Orientation Provided & Reviewed with Pt/Caregiver Including Roles  & Responsibilities: Yes  Decrease burden of Care through IP rehab admission: n/a  Possible need for SNF placement upon discharge: not anticipated  Patient Condition: This patient's condition remains as documented in the consult dated 11/12/24, in which the Rehabilitation Physician determined and documented  that the patient's condition is appropriate for intensive rehabilitative care in an inpatient rehabilitation facility. Will admit to inpatient rehab today.  Preadmission Screen Completed By:  Alison Heron Lot, RN MSN 11/14/2024 10:28 AM ______________________________________________________________________   Discussed status with Dr. Emeline on 11/14/24 at 1027 and received approval for admission today.  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 Yes Co-Morbidities requiring supervision/potential complications:  - History of second-degree AV block with permanent pacemaker -Hypertension -CAD -B12 deficiency             -dysphagia Due to bladder management, bowel management, safety, skin/wound care, disease management, medication administration, pain management, and patient education, does the patient require 24 hr/day rehab nursing? Yes Does the patient require coordinated care of a physician, rehab nurse, therapy disciplines of PT, OT, SLP to address physical and functional deficits in the context of the above medical diagnosis(es)? Yes Addressing deficits in the following areas: balance, endurance, locomotion, strength, transferring, bowel/bladder control, bathing, dressing, feeding, grooming, toileting, speech, swallowing, and psychosocial support, +/- cognition Can the patient actively participate in an intensive therapy program of at least 3 hrs of therapy per day at least 5 days per week? Yes The potential for patient to make measurable gains while on inpatient rehab is excellent Anticipated functional outcomes upon discharge from inpatient rehab are modified independent  with PT, modified independent with OT, modified independent with SLP. Estimated rehab length of stay to reach the above functional goals is: 7-10 days Anticipated discharge destination: Home Overall Rehab/Functional Prognosis: excellent   MD Signature:  Joesph JAYSON Emeline, DO 11/14/2024      [1]  Current Facility-Administered Medications:    acetaminophen  (TYLENOL ) tablet 500 mg, 500 mg, Oral, Q6H PRN, Bender, Emily, DO, 500 mg at 11/13/24 1322   alum & mag hydroxide-simeth (MAALOX/MYLANTA) 200-200-20 MG/5ML suspension 15 mL, 15 mL, Oral,  Q8H, Nguyen, Diana, MD, 15 mL at 11/14/24 9351   amLODipine  (NORVASC ) tablet 5 mg, 5 mg, Oral, Daily, Rihner, Emilie, DO, 5 mg at 11/13/24 0845   artificial tears ophthalmic solution 1 drop, 1 drop, Both Eyes, PRN, Reome, Earle J, RPH   aspirin  chewable tablet 81 mg, 81 mg, Oral, Daily, Waddell Aquas A, NP, 81 mg at 11/13/24 0846   clopidogrel  (PLAVIX ) tablet 75 mg, 75 mg, Oral, Daily, Shafer, Devon, NP, 75 mg at 11/13/24 0845   DULoxetine  (CYMBALTA ) DR capsule 20 mg, 20 mg, Oral, Daily, Rihner, Emilie, DO, 20 mg at 11/13/24 1100   enoxaparin  (LOVENOX ) injection 40 mg, 40 mg, Subcutaneous, Q24H, Eben Reyes JAYSON, MD, 40 mg at 11/13/24 1322   famotidine  (PEPCID ) tablet 20 mg, 20 mg, Oral, Daily, D'Mello, Rosalyn, DO, 20 mg at 11/13/24 0846   gabapentin  (NEURONTIN ) capsule 300 mg, 300 mg, Oral, TID, Juberg, Christopher, DO, 300 mg at 11/13/24 2103   HYDROcodone -acetaminophen  (NORCO/VICODIN) 5-325 MG per tablet 1 tablet, 1 tablet, Oral, BID, D'Mello, Rosalyn, DO, 1 tablet at 11/13/24 2103   lidocaine  (LMX) 4 % cream, , Topical, BID PRN, Nguyen, Diana, MD   Oral care mouth rinse, 15 mL, Mouth Rinse, PRN, Eben Reyes JAYSON, MD   rosuvastatin  (CRESTOR ) tablet 40 mg, 40 mg, Oral, Daily, Shafer, Devon, NP, 40 mg at 11/13/24 [phone]  senna-docusate (Senokot-S) tablet 1 tablet, 1 tablet, Oral, QHS PRN, D'Mello, Rosalyn, DO, 1 tablet at 11/13/24 0846   zolpidem  (AMBIEN ) tablet 5 mg, 5 mg, Oral, QHS, D'Mello, Rosalyn, DO, 5 mg at 11/13/24 2104

## 2024-11-12 NOTE — Progress Notes (Signed)
 "  HD#3 SUBJECTIVE:  Patient Summary: Kayla Terry is a 79 y.o. with a pertinent PMH of T2DM Mobitz s/p PPM, prior CVA in 2023 without residual deficits, chronic pain due to spinal stenosis, and HTN, who presented with new dysarthria. Code stroke was called and patient was found to have subacute left basal ganglia infarct on CT and later confirmed on MRI.   Overnight Events: no overnight events  Interim History: Patient sitting comfortably in bed this morning. She feels that speech has improved. She notes that she had some leg pain last night. The cream caused some burning in her upper legs. She reports that her leg sensation feels the same, and reports that her feet hurt all the time. She reports that her stomach is feeling okay today.   OBJECTIVE:  Vital Signs: Vitals:   11/11/24 2015 11/11/24 2324 11/12/24 0331 11/12/24 0734  BP: (!) 170/116 (!) 132/92 (!) 137/97 (!) 153/91  Pulse: (!) 110 75 80 88  Resp: 18 18 18    Temp: (!) 97.5 F (36.4 C) 98.9 F (37.2 C) 98 F (36.7 C) 97.9 F (36.6 C)  TempSrc: Oral Oral  Oral  SpO2: 95% 97% 99% 97%  Weight:      Height:       Supplemental O2: Room Air SpO2: 97 %  Filed Weights   11/09/24 1000  Weight: 69.4 kg     Intake/Output Summary (Last 24 hours) at 11/12/2024 1056 Last data filed at 11/11/2024 1800 Gross per 24 hour  Intake 600 ml  Output --  Net 600 ml   Net IO Since Admission: 380 mL [11/12/24 1056]  Physical Exam: Physical Exam Constitutional:      General: She is not in acute distress.    Appearance: She is not ill-appearing, toxic-appearing or diaphoretic.  Cardiovascular:     Rate and Rhythm: Normal rate and regular rhythm.     Heart sounds: Normal heart sounds. No murmur heard.    No friction rub. No gallop.  Pulmonary:     Effort: Pulmonary effort is normal. No respiratory distress.     Breath sounds: Normal breath sounds. No stridor. No wheezing, rhonchi or rales.  Abdominal:     General: Bowel sounds  are normal.     Palpations: Abdomen is soft.     Tenderness: There is no abdominal tenderness.  Musculoskeletal:     Right lower leg: No edema.     Left lower leg: No edema.  Skin:    General: Skin is warm.  Neurological:     Mental Status: She is alert.     Cranial Nerves: No cranial nerve deficit.     Sensory: No sensory deficit.     Motor: Weakness (4+/5 RUE and RLE. 5/5 LUE and LLE.) present.     Comments: Slight aphasia present, improved from 1/12      Patient Lines/Drains/Airways Status     Active Line/Drains/Airways     Name Placement date Placement time Site Days   Peripheral IV 11/09/24 18 G Anterior;Distal;Left;Upper Arm 11/09/24  --  Arm  3   Peripheral IV 11/09/24 18 G Anterior;Distal;Right;Upper Arm 11/09/24  --  Arm  3            Pertinent labs and imaging:      Latest Ref Rng & Units 11/12/2024    2:20 AM 11/10/2024    1:33 AM 11/09/2024   10:34 AM  CBC  WBC 4.0 - 10.5 K/uL 7.4  7.0    Hemoglobin  12.0 - 15.0 g/dL 86.5  87.6  87.3   Hematocrit 36.0 - 46.0 % 39.4  36.5  37.0   Platelets 150 - 400 K/uL 276  246         Latest Ref Rng & Units 11/12/2024    2:20 AM 11/11/2024    1:52 AM 11/10/2024    1:33 AM  CMP  Glucose 70 - 99 mg/dL 899  95  95   BUN 8 - 23 mg/dL 16  17  15    Creatinine 0.44 - 1.00 mg/dL 9.05  8.93  9.08   Sodium 135 - 145 mmol/L 139  139  140   Potassium 3.5 - 5.1 mmol/L 3.7  3.8  3.3   Chloride 98 - 111 mmol/L 102  103  101   CO2 22 - 32 mmol/L 26  26  28    Calcium  8.9 - 10.3 mg/dL 9.2  9.1  9.1     ECHOCARDIOGRAM COMPLETE Result Date: 11/11/2024    ECHOCARDIOGRAM REPORT   Patient Name:   Kayla Terry Date of Exam: 11/11/2024 Medical Rec #:  [id number]      Height:       63.0 in Accession #:    [id number]     Weight:       153.0 lb Date of Birth:  [birth year]      BSA:          1.726 m Patient Age:    78 years       BP:           148/87 mmHg Patient Gender: F              HR:           68 bpm. Exam Location:  Inpatient Procedure:  2D Echo, Cardiac Doppler and Color Doppler (Both Spectral and Color            Flow Doppler were utilized during procedure). Indications:    Stroke I63.9  History:        Patient has prior history of Echocardiogram examinations, most                 recent 04/05/2024. Risk Factors:Hypertension.  Sonographer:    Nathanel Devonshire Referring Phys: [id number] DENISE A WOLFE IMPRESSIONS  1. Left ventricular ejection fraction, by estimation, is 60 to 65%. The left ventricle has normal function. The left ventricle has no regional wall motion abnormalities. Left ventricular diastolic parameters were normal.  2. Right ventricular systolic function is normal. The right ventricular size is normal.  3. The mitral valve is grossly normal. Trivial mitral valve regurgitation. No evidence of mitral stenosis.  4. The aortic valve was not well visualized. Aortic valve regurgitation is not visualized. No aortic stenosis is present. Comparison(s): No significant change from prior study. Conclusion(s)/Recommendation(s): Normal biventricular function without evidence of hemodynamically significant valvular heart disease. No intracardiac source of embolism detected on this transthoracic study. Consider a transesophageal echocardiogram to exclude cardiac source of embolism if clinically indicated. FINDINGS  Left Ventricle: Left ventricular ejection fraction, by estimation, is 60 to 65%. The left ventricle has normal function. The left ventricle has no regional wall motion abnormalities. The left ventricular internal cavity size was normal in size. There is  no left ventricular hypertrophy. Left ventricular diastolic parameters were normal. Right Ventricle: The right ventricular size is normal. Right vetricular wall thickness was not well visualized. Right ventricular systolic function is normal. Left Atrium: Left atrial size was normal in size.  Right Atrium: Right atrial size was normal in size. Pericardium: There is no evidence of pericardial effusion.  Mitral Valve: The mitral valve is grossly normal. Trivial mitral valve regurgitation. No evidence of mitral valve stenosis. Tricuspid Valve: The tricuspid valve is not well visualized. Tricuspid valve regurgitation is not demonstrated. No evidence of tricuspid stenosis. Aortic Valve: The aortic valve was not well visualized. Aortic valve regurgitation is not visualized. No aortic stenosis is present. Aortic valve mean gradient measures 2.0 mmHg. Aortic valve peak gradient measures 3.9 mmHg. Aortic valve area, by VTI measures 2.87 cm. Pulmonic Valve: The pulmonic valve was not well visualized. Pulmonic valve regurgitation is not visualized. No evidence of pulmonic stenosis. Aorta: The aortic root and ascending aorta are structurally normal, with no evidence of dilitation. Venous: The inferior vena cava was not well visualized. IAS/Shunts: The atrial septum is grossly normal.  LEFT VENTRICLE PLAX 2D LVIDd:         3.40 cm     Diastology LVIDs:         2.20 cm     LV e' medial:    8.05 cm/s LV PW:         0.80 cm     LV E/e' medial:  7.6 LV IVS:        0.80 cm     LV e' lateral:   6.09 cm/s LVOT diam:     1.90 cm     LV E/e' lateral: 10.0 LV SV:         52 LV SV Index:   30 LVOT Area:     2.84 cm LV IVRT:       77 msec  LV Volumes (MOD) LV vol d, MOD A2C: 26.7 ml LV vol d, MOD A4C: 58.7 ml LV vol s, MOD A2C: 10.0 ml LV vol s, MOD A4C: 18.7 ml LV SV MOD A2C:     16.7 ml LV SV MOD A4C:     58.7 ml LV SV MOD BP:      28.3 ml RIGHT VENTRICLE RV Basal diam:  2.10 cm TAPSE (M-mode): 1.6 cm LEFT ATRIUM           Index       RIGHT ATRIUM          Index LA diam:      2.60 cm 1.51 cm/m  RA Area:     5.29 cm LA Vol (A2C): 15.5 ml 8.98 ml/m  RA Volume:   8.37 ml  4.85 ml/m LA Vol (A4C): 16.9 ml 9.79 ml/m  AORTIC VALVE                    PULMONIC VALVE AV Area (Vmax):    2.74 cm     PV Vmax:       0.71 m/s AV Area (Vmean):   2.55 cm     PV Peak grad:  2.0 mmHg AV Area (VTI):     2.87 cm AV Vmax:           99.30 cm/s AV  Vmean:          69.500 cm/s AV VTI:            0.183 m AV Peak Grad:      3.9 mmHg AV Mean Grad:      2.0 mmHg LVOT Vmax:         95.80 cm/s LVOT Vmean:        62.400 cm/s LVOT VTI:  0.185 m LVOT/AV VTI ratio: 1.01  AORTA Ao Root diam: 2.80 cm Ao Asc diam:  3.10 cm MITRAL VALVE MV Area (PHT): 3.53 cm    SHUNTS MV Decel Time: 215 msec    Systemic VTI:  0.18 m MV E velocity: 60.80 cm/s  Systemic Diam: 1.90 cm MV A velocity: 81.80 cm/s MV E/A ratio:  0.74 Shelda Bruckner MD Electronically signed by Shelda Bruckner MD Signature Date/Time: 11/11/2024/5:36:33 PM    Final     ASSESSMENT/PLAN:  Assessment: Principal Problem:   Acute stroke due to ischemia [facility]) Active Problems:   Benign essential hypertension   Hyperlipidemia   Restless leg syndrome   Leukocytosis   Spinal stenosis of lumbar region   Second degree AV block, Mobitz type II   History of stroke   Cerebrovascular accident (CVA) (HCC)   Plan: #Subacute left basal ganglia infarct  #Mild mass effect 2/2 CVA Echo without shunt, EF 60-65%.  Neurology following and recommending:  - continue DAPT with ASA 81 mg and clopidogrel  75 mg daily for 3 weeks then ASA alone.  - Risk factors:   - A1C 5.3, at goal  - LDL goal <70, current LDL 837 continue rosuvastatin  40 mg  - Interrogate pacemaker to look for paroxysmal afib - PT/OT consulted. Recommending CIR (see below)   #Dizziness  Orthostatic vitals obtained yesterday (lying 164/88 HR 64, sitting 156/102 HR 90, standing 158/100 HR 117, standing 3 min 152/85 HR 118.) met POTS criteria (>30 BPM increase from laying to standing, negative orthostatics). Per PT note, patient still experiencing dizziness. Vestibular assessment was negative even though all movements appears to cause dizziness/distress. Is able to ambulate 25 feet with RW and min assist for stability. Patient still has poor right foot clearance, shortened step length, and reports knees feel wobbly. CIR still  recommended. Patient stated that her goal would be home, but is open to CIR. Will discuss with patient again. - pending CIR - consider further POTS workup outpatient, can start with encouraging fluids - encourage oral fluids  #HTN Home meds included amlodipine  5 mg and losartan  50 mg, both PRN. Still hypertensive with amlodipine  to SBP 153, but generally around 132. Will continue to monitor, especially in setting of dizziness.   - amlodipine  5 mg daily   #Chronic pain  Home medications: gabapentin  300-600 mg TID, hydrocodone -acetaminophen  5-325 mg BID PRN. This Am, stated legs were in pain. Had not received PRN gabapentin  yet.  - Gapapentin 300 mg BID PRN ordered  - hydrocodone -acetaminophen  5-325 mg BID PRN  #Chest Pain  Likely 2/2 GERD. ACS workup negative. Still complaining of mild stomach burning. Last BM yesterday.  - continue famotidine  20 mg daily  - Maalox ordered  #Insomnia  Home medications: Trazodone  100 mg PRN and Zolpidem  10 mg at bedtime. Decreased dose given age. Trazodone  could not be confirmed by family.  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 Neurology was informed. MRI 1/12   Best Practice: Diet: Regular diet VTE: enoxaparin  (LOVENOX ) injection 40 mg Start: 11/12/24 1300 Code: DNR-limited   Disposition planning: Therapy Recs: CIR Family Contact: daughter DISPO: Anticipated discharge pending CIR eval  Signature:  Riese Hellard [facility]  10:56 AM, 11/12/2024  On Call pager [phone]  "

## 2024-11-12 NOTE — Plan of Care (Signed)
   Problem: Ischemic Stroke/TIA Tissue Perfusion: Goal: Complications of ischemic stroke/TIA will be minimized Outcome: Progressing

## 2024-11-12 NOTE — Evaluation (Signed)
 Speech Language Pathology Evaluation Patient Details Name: Kayla Terry MRN: [id number] DOB: 06-03-1946 Today's Date: 11/12/2024 Time: 8997-8974 SLP Time Calculation (min) (ACUTE ONLY): 23 min  Problem List:  Patient Active Problem List   Diagnosis Date Noted   Cerebrovascular accident (CVA) (HCC) 11/10/2024   History of stroke 11/09/2024   Second degree AV block, Mobitz type II 04/04/2024   Pain of left heel 02/06/2024   Spinal stenosis of lumbar region 08/15/2023   Benign neoplasm of colon 01/17/2023   Acute stroke due to ischemia (HCC) 10/21/2022   Diverticulitis of colon 10/19/2022   Leukocytosis 10/19/2022   Abdominal pain 08/02/2022   Hypertensive urgency 08/01/2022   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/06/2022   COVID-19 virus infection 04/23/2021   Coronary artery disease 04/23/2021   Chest pain 04/22/2021   Chest pain of uncertain etiology    Nonspecific abnormal electrocardiogram (ECG) (EKG)    2nd degree AV block 03/25/2021   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   B12 deficiency 05/07/2020   Muscle weakness 05/06/2020   Benign essential hypertension 06/08/2017   Acid reflux disease 03/08/2016   Hyperlipidemia 03/08/2016   Nocturia 03/08/2016   Restless leg syndrome 03/08/2016   Chest tightness 08/21/2012   Past Medical History:  Past Medical History:  Diagnosis Date   Allergy    B12 deficiency 04/2020   Cataract    Coronary artery disease    COVID-19 04/22/2021   Diverticulitis    10/19/2022   Fatty liver    GERD (gastroesophageal reflux disease)    Herpes zoster without complications    Hiatal hernia    Hypertension    Mild mitral regurgitation 2013   Mobitz type 2 second degree atrioventricular block    Pectus excavatum    pectus excavatum deformity resulting in mass effect on the RV by CT 03/2020   Pulmonary nodule    Shingles    Stroke (HCC)    10/19/2022   Past Surgical History:  Past  Surgical History:  Procedure Laterality Date   ABDOMINAL HYSTERECTOMY  02/1984   BACK SURGERY     CATARACT EXTRACTION, BILATERAL     COLONOSCOPY WITH PROPOFOL  N/A 01/17/2023   Procedure: COLONOSCOPY WITH PROPOFOL ;  Surgeon: Leigh Elspeth SQUIBB, MD;  Location: [facility];  Service: Gastroenterology;  Laterality: N/A;   LEFT HEART CATH AND CORONARY ANGIOGRAPHY N/A 04/22/2021   Procedure: LEFT HEART CATH AND CORONARY ANGIOGRAPHY;  Surgeon: Verlin Lonni BIRCH, MD;  Location: [facility];  Service: Cardiovascular;  Laterality: N/A;   LUMBAR LAMINECTOMY     OOPHORECTOMY  02/29/1984   unilateral   PACEMAKER IMPLANT N/A 04/05/2024   Procedure: PACEMAKER IMPLANT;  Surgeon: Cindie Ole DASEN, MD;  Location: [facility];  Service: Cardiovascular;  Laterality: N/A;   POLYPECTOMY  01/17/2023   Procedure: POLYPECTOMY;  Surgeon: Leigh Elspeth SQUIBB, MD;  Location: [facility];  Service: Gastroenterology;;   TEMPORARY PACEMAKER N/A 04/04/2024   Procedure: TEMPORARY PACEMAKER;  Surgeon: Elmira Newman PARAS, MD;  Location: [facility];  Service: Cardiovascular;  Laterality: N/A;   HPI:  Kayla Terry is a 79 y.o. female who presented from home to the hospital on 11/09/24 after sudden onset of difficulty speaking, walking and right facial droop. CTH showed left basal ganglia subacute lacunar infarct, mild mass effect, no hemorrhage. MRI brain showed acute or subacute left basal ganglia infarct with no significant hemorrhage. PMH: prior CVA in 2023 without residual deficits, chronic pain due to spinal  stenosis, HTN, s/p PPM.   Assessment / Plan / Recommendation Clinical Impression  SLP recommending skilled intervention acutely and at next venue of care.  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 Speech was intelligible at word level but intelligibility decreased in connected speech. She exhibited  delayed cognitive processing during testing, but confrontational naming was 100% for basic level line drawings. Description of picture scene from [employer] was complete but with hesitations and mild aggramaticism. She was only able to name 5 objects in 60 seconds when given a category, but was 100% accurate with categorical naming. With clock drawing errors in spacing and positioning of clock hands occured in upper left quadrant. She frequently appeared to become anxious/pressured to respond but did not appear overly frustrated. She exhibited awareness to perseverations but had difficulty self-correcting. Plan for further cognitive testing after she has had some intervention for her aphasia.     SLP Assessment  SLP Recommendation/Assessment: Patient needs continued Speech Language Pathology Services SLP Visit Diagnosis: Aphasia (R47.01);Dysarthria and anarthria (R47.1)     Assistance Recommended at Discharge  PRN  Functional Status Assessment Patient has had a recent decline in their functional status and demonstrates the ability to make significant improvements in function in a reasonable and predictable amount of time.  Frequency and Duration min 1 x/week  1 week      SLP Evaluation Cognition  Overall Cognitive Status: Difficult to assess Arousal/Alertness: Awake/alert Orientation Level: Oriented to person;Oriented to place;Oriented to time;Oriented to situation Year: 2026 Month: January Day of Week: Correct Attention: Sustained Sustained Attention: Appears intact Awareness: Impaired Awareness Impairment: Anticipatory impairment;Emergent impairment Behaviors: Perseveration Safety/Judgment: Appears intact       Comprehension  Auditory Comprehension Overall Auditory Comprehension: Impaired Commands: Within Functional Limits Conversation: Simple Interfering Components: Processing speed EffectiveTechniques: Extra processing time;Repetition Visual  Recognition/Discrimination Discrimination: Within Function Limits Reading Comprehension Reading Status: Not tested    Expression Expression Primary Mode of Expression: Verbal Verbal Expression Overall Verbal Expression: Impaired Initiation: No impairment Level of Generative/Spontaneous Verbalization: Phrase Naming: Impairment Confrontation: Within functional limits Convergent: 75-100% accurate Divergent: 50-74% accurate Verbal Errors: Perseveration;Aware of errors;Semantic paraphasias Pragmatics: No impairment Effective Techniques: Open ended questions;Semantic cues Non-Verbal Means of Communication: Not applicable   Oral / Motor  Oral Motor/Sensory Function Overall Oral Motor/Sensory Function: Mild impairment Facial ROM: Reduced right;Suspected CN VII (facial) dysfunction Facial Symmetry: Abnormal symmetry right;Suspected CN VII (facial) dysfunction Facial Strength: Within Functional Limits Facial Sensation: Within Functional Limits Lingual ROM: Within Functional Limits Lingual Symmetry: Within Functional Limits Lingual Strength: Within Functional Limits Lingual Sensation: Within Functional Limits Velum: Within Functional Limits Mandible: Within Functional Limits Motor Speech Overall Motor Speech: Impaired Respiration: Within functional limits Phonation: Normal Resonance: Within functional limits Articulation: Impaired Level of Impairment: Phrase Intelligibility: Intelligibility reduced Word: 75-100% accurate Phrase: 75-100% accurate Sentence: Not tested Conversation: Not tested Motor Planning: Impaired Level of Impairment: Phrase Effective Techniques: Slow rate           Norleen IVAR Blase, MA, CCC-SLP Speech Therapy  11/12/2024, 1:18 PM

## 2024-11-12 NOTE — Plan of Care (Signed)
" °  Problem: Education: Goal: Knowledge of disease or condition will improve Outcome: Progressing Goal: Knowledge of secondary prevention will improve (MUST DOCUMENT ALL) Outcome: Progressing Goal: Knowledge of patient specific risk factors will improve (DELETE if not current risk factor) Outcome: Progressing   Problem: Ischemic Stroke/TIA Tissue Perfusion: Goal: Complications of ischemic stroke/TIA will be minimized 11/12/2024 0119 by Venessa Odella KIDD, RN Outcome: Progressing 11/12/2024 0118 by Venessa Odella KIDD, RN Outcome: Progressing   Problem: Coping: Goal: Will verbalize positive feelings about self Outcome: Progressing Goal: Will identify appropriate support needs Outcome: Progressing   Problem: Health Behavior/Discharge Planning: Goal: Ability to manage health-related needs will improve Outcome: Progressing Goal: Goals will be collaboratively established with patient/family Outcome: Progressing   Problem: Self-Care: Goal: Ability to participate in self-care as condition permits will improve Outcome: Progressing Goal: Verbalization of feelings and concerns over difficulty with self-care will improve Outcome: Progressing Goal: Ability to communicate needs accurately will improve Outcome: Progressing   Problem: Nutrition: Goal: Risk of aspiration will decrease Outcome: Progressing Goal: Dietary intake will improve Outcome: Progressing   Problem: Education: Goal: Knowledge of General Education information will improve Description: Including pain rating scale, medication(s)/side effects and non-pharmacologic comfort measures Outcome: Progressing   Problem: Health Behavior/Discharge Planning: Goal: Ability to manage health-related needs will improve Outcome: Progressing   Problem: Clinical Measurements: Goal: Ability to maintain clinical measurements within normal limits will improve Outcome: Progressing Goal: Will remain free from infection Outcome:  Progressing Goal: Diagnostic test results will improve Outcome: Progressing Goal: Respiratory complications will improve Outcome: Progressing Goal: Cardiovascular complication will be avoided Outcome: Progressing   Problem: Activity: Goal: Risk for activity intolerance will decrease Outcome: Progressing   Problem: Nutrition: Goal: Adequate nutrition will be maintained Outcome: Progressing   Problem: Coping: Goal: Level of anxiety will decrease Outcome: Progressing   Problem: Elimination: Goal: Will not experience complications related to bowel motility Outcome: Progressing Goal: Will not experience complications related to urinary retention Outcome: Progressing   Problem: Pain Managment: Goal: General experience of comfort will improve and/or be controlled Outcome: Progressing   Problem: Safety: Goal: Ability to remain free from injury will improve Outcome: Progressing   Problem: Skin Integrity: Goal: Risk for impaired skin integrity will decrease Outcome: Progressing   "

## 2024-11-12 NOTE — Progress Notes (Signed)
" ° °-  Inpatient Rehabilitation Admissions Coordinator   I met with patient and daughter at bedside. Patient is not in agreement o possible CIR admit. I will have Rehab MD consult today and begin Auth. Daughter and son can provide 24/7 assist after a CIR stay.  Heron Leavell, RN, MSN Rehab Admissions Coordinator [phone] 11/12/2024 11:04 AM  "

## 2024-11-12 NOTE — Progress Notes (Signed)
 STROKE TEAM PROGRESS NOTE   INTERIM HISTORY/SUBJECTIVE Seen in room with her daughter at the bedside.  Rehab coordinator is in the room  .  Patient wants to go home but therapy recommends inpatient rehab.  Echocardiogram shows EF 60 to 65%.  Left atrial size is normal.  Pacemaker interrogation for A-fib is pending OBJECTIVE  CBC    Component Value Date/Time   WBC 7.4 11/12/2024 0220   RBC 4.50 11/12/2024 0220   HGB 13.4 11/12/2024 0220   HCT 39.4 11/12/2024 0220   PLT 276 11/12/2024 0220   MCV 87.6 11/12/2024 0220   MCV 95.6 10/07/2013 0903   MCH 29.8 11/12/2024 0220   MCHC 34.0 11/12/2024 0220   RDW 13.2 11/12/2024 0220   LYMPHSABS 1.1 11/09/2024 1029   MONOABS 1.0 11/09/2024 1029   EOSABS 0.0 11/09/2024 1029   BASOSABS 0.0 11/09/2024 1029    BMET    Component Value Date/Time   NA 139 11/12/2024 0220   NA 140 05/06/2020 1435   K 3.7 11/12/2024 0220   CL 102 11/12/2024 0220   CO2 26 11/12/2024 0220   GLUCOSE 100 (H) 11/12/2024 0220   BUN 16 11/12/2024 0220   BUN 9 05/06/2020 1435   CREATININE 0.94 11/12/2024 0220   CREATININE 0.92 10/07/2013 0859   CALCIUM  9.2 11/12/2024 0220   GFRNONAA >60 11/12/2024 0220    IMAGING past 24 hours ECHOCARDIOGRAM COMPLETE Result Date: 11/11/2024    ECHOCARDIOGRAM REPORT   Patient Name:   Kayla Terry Date of Exam: 11/11/2024 Medical Rec #:  [id number]      Height:       63.0 in Accession #:    [id number]     Weight:       153.0 lb Date of Birth:  [birth year]      BSA:          1.726 m Patient Age:    78 years       BP:           148/87 mmHg Patient Gender: F              HR:           68 bpm. Exam Location:  Inpatient Procedure: 2D Echo, Cardiac Doppler and Color Doppler (Both Spectral and Color            Flow Doppler were utilized during procedure). Indications:    Stroke I63.9  History:        Patient has prior history of Echocardiogram examinations, most                 recent 04/05/2024. Risk Factors:Hypertension.  Sonographer:    Nathanel Devonshire Referring Phys: [phone] DENISE A WOLFE IMPRESSIONS  1. Left ventricular ejection fraction, by estimation, is 60 to 65%. The left ventricle has normal function. The left ventricle has no regional wall motion abnormalities. Left ventricular diastolic parameters were normal.  2. Right ventricular systolic function is normal. The right ventricular size is normal.  3. The mitral valve is grossly normal. Trivial mitral valve regurgitation. No evidence of mitral stenosis.  4. The aortic valve was not well visualized. Aortic valve regurgitation is not visualized. No aortic stenosis is present. Comparison(s): No significant change from prior study. Conclusion(s)/Recommendation(s): Normal biventricular function without evidence of hemodynamically significant valvular heart disease. No intracardiac source of embolism detected on this transthoracic study. Consider a transesophageal echocardiogram to exclude cardiac source of embolism if clinically indicated. FINDINGS  Left Ventricle: Left ventricular  ejection fraction, by estimation, is 60 to 65%. The left ventricle has normal function. The left ventricle has no regional wall motion abnormalities. The left ventricular internal cavity size was normal in size. There is  no left ventricular hypertrophy. Left ventricular diastolic parameters were normal. Right Ventricle: The right ventricular size is normal. Right vetricular wall thickness was not well visualized. Right ventricular systolic function is normal. Left Atrium: Left atrial size was normal in size. Right Atrium: Right atrial size was normal in size. Pericardium: There is no evidence of pericardial effusion. Mitral Valve: The mitral valve is grossly normal. Trivial mitral valve regurgitation. No evidence of mitral valve stenosis. Tricuspid Valve: The tricuspid valve is not well visualized. Tricuspid valve regurgitation is not demonstrated. No evidence of tricuspid stenosis. Aortic Valve: The aortic valve was not well  visualized. Aortic valve regurgitation is not visualized. No aortic stenosis is present. Aortic valve mean gradient measures 2.0 mmHg. Aortic valve peak gradient measures 3.9 mmHg. Aortic valve area, by VTI measures 2.87 cm. Pulmonic Valve: The pulmonic valve was not well visualized. Pulmonic valve regurgitation is not visualized. No evidence of pulmonic stenosis. Aorta: The aortic root and ascending aorta are structurally normal, with no evidence of dilitation. Venous: The inferior vena cava was not well visualized. IAS/Shunts: The atrial septum is grossly normal.  LEFT VENTRICLE PLAX 2D LVIDd:         3.40 cm     Diastology LVIDs:         2.20 cm     LV e' medial:    8.05 cm/s LV PW:         0.80 cm     LV E/e' medial:  7.6 LV IVS:        0.80 cm     LV e' lateral:   6.09 cm/s LVOT diam:     1.90 cm     LV E/e' lateral: 10.0 LV SV:         52 LV SV Index:   30 LVOT Area:     2.84 cm LV IVRT:       77 msec  LV Volumes (MOD) LV vol d, MOD A2C: 26.7 ml LV vol d, MOD A4C: 58.7 ml LV vol s, MOD A2C: 10.0 ml LV vol s, MOD A4C: 18.7 ml LV SV MOD A2C:     16.7 ml LV SV MOD A4C:     58.7 ml LV SV MOD BP:      28.3 ml RIGHT VENTRICLE RV Basal diam:  2.10 cm TAPSE (M-mode): 1.6 cm LEFT ATRIUM           Index       RIGHT ATRIUM          Index LA diam:      2.60 cm 1.51 cm/m  RA Area:     5.29 cm LA Vol (A2C): 15.5 ml 8.98 ml/m  RA Volume:   8.37 ml  4.85 ml/m LA Vol (A4C): 16.9 ml 9.79 ml/m  AORTIC VALVE                    PULMONIC VALVE AV Area (Vmax):    2.74 cm     PV Vmax:       0.71 m/s AV Area (Vmean):   2.55 cm     PV Peak grad:  2.0 mmHg AV Area (VTI):     2.87 cm AV Vmax:           99.30 cm/s  AV Vmean:          69.500 cm/s AV VTI:            0.183 m AV Peak Grad:      3.9 mmHg AV Mean Grad:      2.0 mmHg LVOT Vmax:         95.80 cm/s LVOT Vmean:        62.400 cm/s LVOT VTI:          0.185 m LVOT/AV VTI ratio: 1.01  AORTA Ao Root diam: 2.80 cm Ao Asc diam:  3.10 cm MITRAL VALVE MV Area (PHT): 3.53 cm     SHUNTS MV Decel Time: 215 msec    Systemic VTI:  0.18 m MV E velocity: 60.80 cm/s  Systemic Diam: 1.90 cm MV A velocity: 81.80 cm/s MV E/A ratio:  0.74 Shelda Bruckner MD Electronically signed by Shelda Bruckner MD Signature Date/Time: 11/11/2024/5:36:33 PM    Final     Vitals:   11/11/24 2324 11/12/24 0331 11/12/24 0734 11/12/24 1155  BP: (!) 132/92 (!) 137/97 (!) 153/91 131/81  Pulse: 75 80 88 74  Resp: 18 18  16   Temp: 98.9 F (37.2 C) 98 F (36.7 C) 97.9 F (36.6 C) 97.8 F (36.6 C)  TempSrc: Oral  Oral Oral  SpO2: 97% 99% 97% 97%  Weight:      Height:         PHYSICAL EXAM General:  Alert, mildly obese elderly lady patient in no acute distress Psych:  Mood and affect appropriate for situation CV: Regular rate and rhythm on monitor Respiratory:  Regular, unlabored respirations on room air GI: Abdomen soft and nontender   NEURO:  Mental Status: AA&Ox3, patient is able to give clear and coherent history Speech/Language: speech is minimally dysarthric and slow.  But no definite aphasia.  Naming, repetition, fluency, and comprehension intact.  Cranial Nerves:  II: PERRL. Visual fields full.  III, IV, VI: EOMI. Eyelids elevate symmetrically.  V: Sensation is intact to light touch and symmetrical to face.  VII: Face is symmetrical resting and smiling VIII: hearing intact to voice. IX, X: Palate elevates symmetrically. Phonation is normal.  KP:Dynloizm shrug 5/5. XII: tongue is midline without fasciculations. Motor: Slightly diminished fine finger movements on the right and trace right grip weakness.  Orbits left over right upper extremity.  Trace weakness of right hip flexors. Tone: is normal and bulk is normal Sensation- Intact to light touch bilaterally. Extinction absent to light touch to DSS.   Coordination: FTN intact bilaterally, HKS: no ataxia in BLE.No drift.  Gait- deferred        ASSESSMENT/PLAN  Ms. Maraki Macquarrie is a 79 y.o. female with  history of HTN, Mobitiz type 2, prior CVA, GERD, CAD, B12 deficiency and fatty liver who presents to [facility] ED via EMS as a Code stroke for acute onset of right side weakness, right facial droop and dysarthria. NIH on Admission 5.   Acute Ischemic Infarct:  left basal ganglia  Etiology:  Likely Atherosclerotic (HLD, intracranial atherosclerosis age) Code Stroke CT head - left basal ganglia infarct, ASPECTs 8 CTA head & neck No LVO. There is scattered intracranial atherosclerosis. small area of possible intramural hypodensity of left ICA per my read - discussed with radiology who agreed this was artifact rather than thrombus.  MRI   large left basal ganglia infarct with mild cytotoxic edema and regional mass effect.  No midline shift. Carotid Doppler  na 2D Echo EF 60 to  65%.  LA size normal. LDL 162 HgbA1c 5.3 VTE prophylaxis - lovenox , SCDs aspirin  81 mg daily prior to admission, now on aspirin  81 mg daily and clopidogrel  75 mg daily for 3 weeks and then asa alone. 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  MRI confirms a fairly large basal ganglia infarct likely of cryptogenic etiology.  Recommend interrogate pacemaker to look for paroxysmal A-fib.  Aspirin  and Plavix  for 3 weeks followed by aspirin  alone.  Aggressive risk factor modification.  Mobilize out of bed.  Transfer to inpatient rehab when bed available.  Discussed with patient and daughter and answered questions.  Discussed with rehab coordinator.    I personally spent a total of 35 minutes in the care of the patient today including  getting/reviewing separately obtained history, performing a medically appropriate exam/evaluation, counseling and educating, placing orders, referring and communicating with other health care professionals, documenting clinical information in the EHR, independently interpreting results, and coordinating care.        Eather Popp, MD       To contact Stroke Continuity provider, please refer to [email or web address]. After hours, contact General Neurology
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
## 2024-11-12 NOTE — Care Management Important Message (Signed)
 Important Message  Patient Details  Name: Kayla Terry MRN: [id number] Date of Birth: 1946-03-06   Important Message Given:  Yes - Medicare IM     Claretta Deed 11/12/2024, 2:36 PM

## 2024-11-13 ENCOUNTER — Ambulatory Visit: Admitting: Neurology

## 2024-11-13 DIAGNOSIS — I159 Secondary hypertension, unspecified: Secondary | ICD-10-CM

## 2024-11-13 DIAGNOSIS — R42 Dizziness and giddiness: Secondary | ICD-10-CM

## 2024-11-13 DIAGNOSIS — G609 Hereditary and idiopathic neuropathy, unspecified: Secondary | ICD-10-CM

## 2024-11-13 MED ORDER — DULOXETINE HCL 20 MG PO CPEP
20.0000 mg | ORAL_CAPSULE | Freq: Every day | ORAL | Status: DC
  Administered 2024-11-13 – 2024-11-14 (×2): 20 mg via ORAL
  Filled 2024-11-13 (×2): qty 1

## 2024-11-13 MED ORDER — DULOXETINE HCL 20 MG PO CPEP: 20.0000 mg | ORAL_CAPSULE | Freq: Every day | ORAL | Status: DC

## 2024-11-13 MED ORDER — ALUM & MAG HYDROXIDE-SIMETH 200-200-20 MG/5ML PO SUSP
15.0000 mL | Freq: Three times a day (TID) | ORAL | Status: DC
  Administered 2024-11-13 – 2024-11-14 (×5): 15 mL via ORAL
  Filled 2024-11-13 (×5): qty 30

## 2024-11-13 NOTE — Progress Notes (Signed)
" ° °  Inpatient Rehabilitation Admissions Coordinator   I await insurance determination for possible CIR admit. I met with daughter at bedside to review estimated cost of care if approved.  Heron Leavell, RN, MSN Rehab Admissions Coordinator [phone] 11/13/2024 10:47 AM  "

## 2024-11-13 NOTE — Plan of Care (Signed)

## 2024-11-13 NOTE — Progress Notes (Signed)
" ° °  Inpatient Rehabilitation Admissions Coordinator   I have received insurance approval for Cir but await bed availability in the next 24  to 48 hrs to admit. I have notified both patient and her daughter.  Heron Leavell, RN, MSN Rehab Admissions Coordinator [phone] 11/13/2024 3:27 PM  "

## 2024-11-13 NOTE — Progress Notes (Signed)
 Physical Therapy Treatment Patient Details Name: Kayla Terry MRN: [id number] DOB: 03-14-1946 Today's Date: 11/13/2024   History of Present Illness 79 year old female presents 1/10 after developing new dysarthria. 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    PT Comments  Patient with initial slight drop in BP with sit to stand, however BP recovered by 3 minutes standing. Also stable after ambulation. Pt continues to endorse dizziness, however she appeared less apprehensive with ?less dizziness. She did run into objects on her rt x 6 while walking with RW with min assist x 70 ft.  She showed no awareness that she was about to run into an object and only corrected course after striking object with RW. Patient will benefit from continued inpatient follow up therapy, >3 hours/day    11/13/24 0900  Orthostatic Lying   BP- Lying 131/87  Orthostatic Sitting  BP- Sitting 147/89  Pulse- Sitting 85  Orthostatic Standing at 0 minutes  BP- Standing at 0 minutes 134/86  Pulse- Standing at 0 minutes 99  Orthostatic Standing at 3 minutes  BP- Standing at 3 minutes (!) 141/94  Pulse- Standing at 3 minutes 100      If plan is discharge home, recommend the following: A little help with walking and/or transfers;A little help with bathing/dressing/bathroom;Assistance with cooking/housework;Assist for transportation;Help with stairs or ramp for entrance;Supervision due to cognitive status   Can travel by private vehicle        Equipment Recommendations  None recommended by PT    Recommendations for Other Services       Precautions / Restrictions Precautions Precautions: Fall Recall of Precautions/Restrictions: Intact Restrictions Weight Bearing Restrictions Per Provider Order: No     Mobility  Bed Mobility Overal bed  mobility: Needs Assistance Bed Mobility: Supine to Sit     Supine to sit: Contact guard, HOB elevated, Used rails     General bed mobility comments: for safety due to dizziness    Transfers Overall transfer level: Needs assistance Equipment used: Rolling walker (2 wheels), None Transfers: Sit to/from Stand Sit to Stand: Min assist           General transfer comment: Min assist for boost and balance to rise from EOB    Ambulation/Gait Ambulation/Gait assistance: Min assist Gait Distance (Feet): 70 Feet Assistive device: Rolling walker (2 wheels) Gait Pattern/deviations: Step-through pattern, Decreased stride length, Shuffle, Narrow base of support, Decreased dorsiflexion - right Gait velocity: Dec Gait velocity interpretation: 1.31 - 2.62 ft/sec, indicative of limited community ambulator   General Gait Details: reports dizziness upon standing; appeared less apprehensive than previously; decr Rt foot clearance and ran into objects on her rt x 6   Stairs             Wheelchair Mobility     Tilt Bed    Modified Rankin (Stroke Patients Only) Modified Rankin (Stroke Patients Only) Pre-Morbid Rankin Score: No symptoms Modified Rankin: Moderately severe disability     Balance Overall balance assessment: Needs assistance Sitting-balance support: Feet supported, Bilateral upper extremity supported Sitting balance-Leahy Scale: Poor Sitting balance - Comments: CGA eob with bil UE support due to dizziness   Standing balance support: During functional activity, Bilateral upper extremity supported, Reliant on assistive device for balance Standing balance-Leahy Scale: Poor  Communication Communication Communication: Impaired Factors Affecting Communication: Difficulty expressing self;Reduced clarity of speech  Cognition Arousal: Alert Behavior During Therapy: WFL for tasks assessed/performed   PT - Cognitive impairments:  Difficult to assess Difficult to assess due to: Impaired communication                       Following commands: Impaired Following commands impaired: Only follows one step commands consistently    Cueing Cueing Techniques: Verbal cues, Gestural cues, Tactile cues  Exercises      General Comments General comments (skin integrity, edema, etc.): Pt with difficulty describing dizziness. Denies vision changes yet running into objects consistently on her rt.      Pertinent Vitals/Pain Pain Assessment Pain Assessment: Faces Faces Pain Scale: Hurts little more Pain Location: bil feet Pain Descriptors / Indicators: Aching Pain Intervention(s): Limited activity within patient's tolerance, Monitored during session    Home Living                          Prior Function            PT Goals (current goals can now be found in the care plan section) Acute Rehab PT Goals Patient Stated Goal: get well Time For Goal Achievement: 11/23/24 Potential to Achieve Goals: Good Progress towards PT goals: Progressing toward goals    Frequency    Min 3X/week      PT Plan      Co-evaluation              AM-PAC PT 6 Clicks Mobility   Outcome Measure  Help needed turning from your back to your side while in a flat bed without using bedrails?: A Little Help needed moving from lying on your back to sitting on the side of a flat bed without using bedrails?: A Little Help needed moving to and from a bed to a chair (including a wheelchair)?: A Little Help needed standing up from a chair using your arms (e.g., wheelchair or bedside chair)?: A Little Help needed to walk in hospital room?: A Little Help needed climbing 3-5 steps with a railing? : A Lot 6 Click Score: 17    End of Session Equipment Utilized During Treatment: Gait belt Activity Tolerance: Patient tolerated treatment well Patient left: with call bell/phone within reach;in chair;with chair alarm  set;with nursing/sitter in room Nurse Communication: Mobility status;Other (comment) (orthostatics negative) PT Visit Diagnosis: Unsteadiness on feet (R26.81);Other abnormalities of gait and mobility (R26.89);Other symptoms and signs involving the nervous system (R29.898);Difficulty in walking, not elsewhere classified (R26.2)     Time: 9177-9154 PT Time Calculation (min) (ACUTE ONLY): 23 min  Charges:    $Gait Training: 23-37 mins PT General Charges $$ ACUTE PT VISIT: 1 Visit                      Macario RAMAN, PT Acute Rehabilitation Services  Office [phone]    Macario SHAUNNA Soja 11/13/2024, 9:29 AM

## 2024-11-13 NOTE — Plan of Care (Signed)
  Problem: Education: Goal: Knowledge of disease or condition will improve Outcome: Progressing   Problem: Ischemic Stroke/TIA Tissue Perfusion: Goal: Complications of ischemic stroke/TIA will be minimized Outcome: Progressing   Problem: Coping: Goal: Will verbalize positive feelings about self Outcome: Progressing   Problem: Self-Care: Goal: Ability to participate in self-care as condition permits will improve Outcome: Progressing   Problem: Nutrition: Goal: Risk of aspiration will decrease Outcome: Progressing   Problem: Education: Goal: Knowledge of General Education information will improve Description: Including pain rating scale, medication(s)/side effects and non-pharmacologic comfort measures Outcome: Progressing

## 2024-11-13 NOTE — Progress Notes (Signed)
 Occupational Therapy Treatment Patient Details Name: Kayla Terry MRN: [id number] DOB: 03/03/1946 Today's Date: 11/13/2024   History of present illness 79 year old female presents 1/10 after developing new dysarthria. 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   OT comments  Pt progressing toward established OT goals. Challenging vision, attention, activity tolerance, strength, and balance this session with grooming at sink and therapeutic activity using mirror/marker to facilitate saccades, attention, UE strength, and balance. Pt needing min assist for dynamic balance. Pt continues to present with decreased attention, activity tolerance, and balance. Pt reports improved dizziness this session. Recommending intensive multidisciplinary rehabilitation >3 hours/day to optimize safety and independence in ADL.        If plan is discharge home, recommend the following:  A little help with walking and/or transfers;A little help with bathing/dressing/bathroom;Assistance with cooking/housework;Assist for transportation;Help with stairs or ramp for entrance   Equipment Recommendations  Other (comment) (defer)    Recommendations for Other Services      Precautions / Restrictions Precautions Precautions: Fall Recall of Precautions/Restrictions: Intact Restrictions Weight Bearing Restrictions Per Provider Order: No       Mobility Bed Mobility Overal bed mobility: Needs Assistance Bed Mobility: Sit to Supine       Sit to supine: Contact guard assist   General bed mobility comments: for safety    Transfers Overall transfer level: Needs assistance Equipment used: Rolling walker (2 wheels), None Transfers: Sit to/from Stand Sit to Stand: Min assist           General transfer comment: min A for balance     Balance  Overall balance assessment: Needs assistance Sitting-balance support: Feet supported, Bilateral upper extremity supported Sitting balance-Leahy Scale: Fair Sitting balance - Comments: statically; pt denying dizziness this session   Standing balance support: During functional activity, Bilateral upper extremity supported, Reliant on assistive device for balance Standing balance-Leahy Scale: Poor                             ADL either performed or assessed with clinical judgement   ADL Overall ADL's : Needs assistance/impaired     Grooming: Contact guard assist;Standing;Minimal assistance;Cueing for sequencing Grooming Details (indicate cue type and reason): cues to place toothpaste cap back on brush             Lower Body Dressing: Minimal assistance;Sit to/from stand   Toilet Transfer: Minimal assistance;Rolling walker (2 wheels);Ambulation                  Extremity/Trunk Assessment Upper Extremity Assessment Upper Extremity Assessment: Generalized weakness;RUE deficits/detail (generally weak bilaterally) RUE Deficits / Details: decr coordintaion with very FM tasks such as handwriting, but legible, difficulty writing in s straight line without visual cue (line).   Lower Extremity Assessment Lower Extremity Assessment: Defer to PT evaluation        Vision   Vision Assessment?: Wears glasses for reading;Vision impaired- to be further tested in functional context;Yes Eye Alignment: Within Functional Limits Tracking/Visual Pursuits: Able to track stimulus in all quads without difficulty Visual Fields:  (difficulty reporting # of fingers secondary to expressive difficulties, consistently recognizes a stimulus and able to correctly report 1 or 2 fingers, although may need to be further assessed secondary to deficits in perception during mobility)   Perception  Perception Perception: Impaired Preception Impairment Details:  (R inattention ?)   Praxis      Communication Communication Communication: Impaired Factors Affecting Communication: Difficulty expressing self;Reduced clarity of speech   Cognition Arousal: Alert Behavior During Therapy: WFL for tasks assessed/performed Cognition: Difficult to assess Difficult to assess due to: Impaired communication           OT - Cognition Comments: following one to two step commands with increased time and up to min cues this session, needing cues for recall of task at hand intermittently (let's go brush your teeth, but then walking by sink)                 Following commands: Impaired Following commands impaired: Only follows one step commands consistently      Cueing   Cueing Techniques: Verbal cues, Gestural cues, Tactile cues  Exercises Exercises: Other exercises Other Exercises Other Exercises: standing at mirror performing visual scanning/saccades, crossing midline, reaching outside BOS with 1 UE support during static standing challenging by dynamic task with weight shift. challenging communication (what is that shape or letter, how many, etc), hand eye coordination, and for further visual assessment. Noted to fatigue after erasing 5 shapes and placing an x on each with dry erase marder Other Exercises: standing at thumb board in hall working on visual attention/scanning and pt needing min-mod cues to locate all things orange on R. questionable visual field cut vs reduced attention Other Exercises: reciprocal toe taps with RUE touching L toe etc with alternating figure 4 from seated position. cues for slowing down and coordination    Shoulder Instructions       General Comments Pt with difficulty describing dizziness. Denies vision changes yet running into objects consistently on her rt.    Pertinent Vitals/ Pain       Pain Assessment Pain Assessment: Faces Faces Pain Scale: Hurts little more Pain Location: bil feet Pain Descriptors / Indicators: Aching, Numbness Pain  Intervention(s): Limited activity within patient's tolerance, Monitored during session  Home Living                                          Prior Functioning/Environment              Frequency  Min 2X/week        Progress Toward Goals  OT Goals(current goals can now be found in the care plan section)  Progress towards OT goals: Progressing toward goals  Acute Rehab OT Goals OT Goal Formulation: With patient Time For Goal Achievement: 11/24/24 Potential to Achieve Goals: Good ADL Goals Pt Will Perform Grooming: with modified independence;standing Pt Will Perform Lower Body Dressing: with modified independence;sit to/from stand Pt Will Transfer to Toilet: with supervision;ambulating Pt Will Perform Toileting - Clothing Manipulation and hygiene: with modified independence;sit to/from stand Additional ADL Goal #1: pt will follow 3 step commands with no more than min cues  Plan      Co-evaluation                 AM-PAC OT 6 Clicks Daily Activity     Outcome Measure   Help from another person eating meals?: A Little Help from another person taking care of personal grooming?: A Little Help from another person toileting, which includes using toliet, bedpan, or urinal?: A Little Help from another person bathing (including washing, rinsing, drying)?: A Little Help from  another person to put on and taking off regular upper body clothing?: A Little Help from another person to put on and taking off regular lower body clothing?: A Little 6 Click Score: 18    End of Session Equipment Utilized During Treatment: Gait belt;Rolling walker (2 wheels)  OT Visit Diagnosis: Unsteadiness on feet (R26.81);Muscle weakness (generalized) (M62.81);Other symptoms and signs involving cognitive function   Activity Tolerance Patient tolerated treatment well   Patient Left in bed;with call bell/phone within reach;with bed alarm set   Nurse Communication Mobility  status        Time: 8987-8955 OT Time Calculation (min): 32 min  Charges: OT General Charges $OT Visit: 1 Visit OT Treatments $Self Care/Home Management : 8-22 mins $Therapeutic Activity: 8-22 mins  Elma JONETTA Penner, OTD, OTR/L [facility] Acute Rehabilitation Office: [phone]   Elma JONETTA Penner 11/13/2024, 10:59 AM

## 2024-11-13 NOTE — Plan of Care (Signed)

## 2024-11-13 NOTE — Progress Notes (Signed)
 "  HD#4 SUBJECTIVE:  Patient Summary: Kayla Terry is a 79 y.o. with a pertinent PMH of T2DM Mobitz s/p PPM, prior CVA in 2023 without residual deficits, chronic pain due to spinal stenosis, and HTN, who presented with new dysarthria. Code stroke was called and patient was found to have subacute left basal ganglia infarct on CT and later confirmed on MRI pending CIR.   Overnight Events: no overnight events  Interim History: Patient cannot tell if her speech has improved or not today. She still complains of some mild stomach discomfort. Last BM was yesterday and was soft in nature. She continues to have leg pain and did not feel like the creams helped even her lower legs. Overall with a positive attitude today and no complaints otherwise.   OBJECTIVE:  Vital Signs: Vitals:   11/12/24 2025 11/12/24 2350 11/13/24 0342 11/13/24 0824  BP: (!) 146/87 108/65 106/74 131/87  Pulse: 70 65 72 69  Resp:    20  Temp: (!) 97.3 F (36.3 C) (!) 97.4 F (36.3 C) 98 F (36.7 C) (!) 97.5 F (36.4 C)  TempSrc: Oral Oral Oral Oral  SpO2: 95% 97% 98% 97%  Weight:      Height:       Supplemental O2: Room Air SpO2: 97 %  Filed Weights   11/09/24 1000  Weight: 69.4 kg    No intake or output data in the 24 hours ending 11/13/24 0901  Net IO Since Admission: 380 mL [11/13/24 0901]  Physical Exam: Physical Exam Constitutional:      General: She is not in acute distress.    Appearance: She is not ill-appearing, toxic-appearing or diaphoretic.  Eyes:     Pupils: Pupils are equal, round, and reactive to light.  Cardiovascular:     Rate and Rhythm: Normal rate and regular rhythm.     Heart sounds: Normal heart sounds. No murmur heard.    No friction rub. No gallop.  Pulmonary:     Effort: Pulmonary effort is normal. No respiratory distress.     Breath sounds: Normal breath sounds. No stridor. No wheezing, rhonchi or rales.  Abdominal:     General: Bowel sounds are normal.     Palpations:  Abdomen is soft.     Tenderness: There is no abdominal tenderness.  Musculoskeletal:     Right lower leg: No edema.     Left lower leg: No edema.  Skin:    General: Skin is warm.  Neurological:     Mental Status: She is alert.     Cranial Nerves: No cranial nerve deficit.     Sensory: No sensory deficit.     Motor: Weakness (4+/5 RUE and RLE. 5/5 LUE and LLE.) present.     Comments: Slight aphasia present, stable from 1/13      Patient Lines/Drains/Airways Status     Active Line/Drains/Airways     Name Placement date Placement time Site Days   Peripheral IV 11/09/24 18 G Anterior;Distal;Left;Upper Arm 11/09/24  --  Arm  4   Peripheral IV 11/09/24 18 G Anterior;Distal;Right;Upper Arm 11/09/24  --  Arm  4            Pertinent labs and imaging:      Latest Ref Rng & Units 11/12/2024    2:20 AM 11/10/2024    1:33 AM 11/09/2024   10:34 AM  CBC  WBC 4.0 - 10.5 K/uL 7.4  7.0    Hemoglobin 12.0 - 15.0 g/dL 86.5  12.3  12.6   Hematocrit 36.0 - 46.0 % 39.4  36.5  37.0   Platelets 150 - 400 K/uL 276  246         Latest Ref Rng & Units 11/12/2024    2:20 AM 11/11/2024    1:52 AM 11/10/2024    1:33 AM  CMP  Glucose 70 - 99 mg/dL 899  95  95   BUN 8 - 23 mg/dL 16  17  15    Creatinine 0.44 - 1.00 mg/dL 9.05  8.93  9.08   Sodium 135 - 145 mmol/L 139  139  140   Potassium 3.5 - 5.1 mmol/L 3.7  3.8  3.3   Chloride 98 - 111 mmol/L 102  103  101   CO2 22 - 32 mmol/L 26  26  28    Calcium  8.9 - 10.3 mg/dL 9.2  9.1  9.1     No results found.   ASSESSMENT/PLAN:  Assessment: Principal Problem:   Acute stroke due to ischemia [facility]) Active Problems:   Benign essential hypertension   Hyperlipidemia   Restless leg syndrome   Leukocytosis   Spinal stenosis of lumbar region   Second degree AV block, Mobitz type II   History of stroke   Cerebrovascular accident (CVA) (HCC)   Plan: #Subacute left basal ganglia infarct  #Mild mass effect 2/2 CVA Symptoms stable. Neurology  following and recommending:  - continue DAPT with ASA 81 mg and clopidogrel  75 mg daily for 3 weeks then ASA alone.  - Risk factors:   - A1C 5.3, at goal  - LDL goal <70, current LDL 837 continue rosuvastatin  40 mg  - Echo without shunt, EF 60-65%.  - Interrogate pacemaker: no a. fib - PT/OT consulted. Recommending CIR (see below)   #Dizziness  Met POTS criteria (>30 BPM increase from laying to standing, negative orthostatics). Per PT note, patient still experiencing dizziness. Vestibular assessment was negative. Will continue to monitor symptoms, blood pressure, and work with PT. Still having slight dizziness with change in position that then subsides.  - pending CIR - consider further POTS workup outpatient - encourage oral fluids  #HTN Home meds included amlodipine  5 mg and losartan  50 mg, both PRN. BP stable.  - amlodipine  5 mg daily   #Chronic pain  #Neuropathy  Home medications: gabapentin  300-600 mg TID, hydrocodone -acetaminophen  5-325 mg BID PRN. Patient has complained of worsening leg pain to nursing. Discussed with patient starting Cymbalta  and patient stated okay with initiation.  - Cymbalta  20 mg started, will titrate up.  - Gapapentin 300 mg BID PRN ordered  - hydrocodone -acetaminophen  5-325 mg BID PRN  #Chest Pain  Likely 2/2 GERD. ACS workup negative. Still complaining of mild stomach burning. Having regular bowel movements, soft.  - continue famotidine  20 mg daily  - Maalox scheduled   #Insomnia  Home medications: Trazodone  100 mg PRN and Zolpidem  10 mg at bedtime. Decreased dose given age. Trazodone  could not be confirmed by family.  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 Neurology was informed. MRI 1/12. Interrogation did not show afib.    Best Practice: Diet: Regular diet VTE: enoxaparin  (LOVENOX ) injection 40 mg Start:  11/12/24 1300 Code: DNR-limited   Disposition planning: Therapy Recs: CIR Family Contact: daughter DISPO: Anticipated discharge pending CIR eval  Signature:  Aaralynn Shepheard [facility]  9:01 AM, 11/13/2024  On Call pager [phone]  "

## 2024-11-14 ENCOUNTER — Other Ambulatory Visit (HOSPITAL_COMMUNITY): Payer: Self-pay

## 2024-11-14 ENCOUNTER — Other Ambulatory Visit: Payer: Self-pay

## 2024-11-14 ENCOUNTER — Inpatient Hospital Stay (HOSPITAL_COMMUNITY)
Admission: AD | Admit: 2024-11-14 | Discharge: 2024-11-26 | DRG: 057 | Disposition: A | Discharge status: Home / Self Care | Source: Intra-hospital | Attending: Physical Medicine & Rehabilitation | Admitting: Physical Medicine & Rehabilitation

## 2024-11-14 DIAGNOSIS — Z888 Allergy status to other drugs, medicaments and biological substances status: Secondary | ICD-10-CM | POA: Diagnosis not present

## 2024-11-14 DIAGNOSIS — M4800 Spinal stenosis, site unspecified: Secondary | ICD-10-CM

## 2024-11-14 DIAGNOSIS — Z7902 Long term (current) use of antithrombotics/antiplatelets: Secondary | ICD-10-CM

## 2024-11-14 DIAGNOSIS — Z9842 Cataract extraction status, left eye: Secondary | ICD-10-CM

## 2024-11-14 DIAGNOSIS — E785 Hyperlipidemia, unspecified: Secondary | ICD-10-CM | POA: Diagnosis present

## 2024-11-14 DIAGNOSIS — Z9841 Cataract extraction status, right eye: Secondary | ICD-10-CM

## 2024-11-14 DIAGNOSIS — K219 Gastro-esophageal reflux disease without esophagitis: Secondary | ICD-10-CM | POA: Diagnosis present

## 2024-11-14 DIAGNOSIS — Z820 Family history of epilepsy and other diseases of the nervous system: Secondary | ICD-10-CM | POA: Diagnosis not present

## 2024-11-14 DIAGNOSIS — Z79899 Other long term (current) drug therapy: Secondary | ICD-10-CM | POA: Diagnosis not present

## 2024-11-14 DIAGNOSIS — Z950 Presence of cardiac pacemaker: Secondary | ICD-10-CM

## 2024-11-14 DIAGNOSIS — K5900 Constipation, unspecified: Secondary | ICD-10-CM | POA: Diagnosis present

## 2024-11-14 DIAGNOSIS — M4807 Spinal stenosis, lumbosacral region: Secondary | ICD-10-CM | POA: Diagnosis present

## 2024-11-14 DIAGNOSIS — G8929 Other chronic pain: Secondary | ICD-10-CM | POA: Diagnosis present

## 2024-11-14 DIAGNOSIS — Z7982 Long term (current) use of aspirin: Secondary | ICD-10-CM | POA: Diagnosis not present

## 2024-11-14 DIAGNOSIS — Z8673 Personal history of transient ischemic attack (TIA), and cerebral infarction without residual deficits: Secondary | ICD-10-CM | POA: Diagnosis not present

## 2024-11-14 DIAGNOSIS — R4701 Aphasia: Secondary | ICD-10-CM | POA: Diagnosis present

## 2024-11-14 DIAGNOSIS — Z8616 Personal history of COVID-19: Secondary | ICD-10-CM

## 2024-11-14 DIAGNOSIS — I69351 Hemiplegia and hemiparesis following cerebral infarction affecting right dominant side: Principal | ICD-10-CM

## 2024-11-14 DIAGNOSIS — I69322 Dysarthria following cerebral infarction: Secondary | ICD-10-CM | POA: Diagnosis not present

## 2024-11-14 DIAGNOSIS — I69392 Facial weakness following cerebral infarction: Secondary | ICD-10-CM

## 2024-11-14 DIAGNOSIS — I10 Essential (primary) hypertension: Secondary | ICD-10-CM | POA: Diagnosis present

## 2024-11-14 DIAGNOSIS — M79671 Pain in right foot: Secondary | ICD-10-CM | POA: Diagnosis present

## 2024-11-14 DIAGNOSIS — G4700 Insomnia, unspecified: Secondary | ICD-10-CM | POA: Diagnosis present

## 2024-11-14 DIAGNOSIS — I2510 Atherosclerotic heart disease of native coronary artery without angina pectoris: Secondary | ICD-10-CM | POA: Diagnosis present

## 2024-11-14 DIAGNOSIS — I639 Cerebral infarction, unspecified: Secondary | ICD-10-CM | POA: Diagnosis not present

## 2024-11-14 DIAGNOSIS — E1340 Other specified diabetes mellitus with diabetic neuropathy, unspecified: Secondary | ICD-10-CM

## 2024-11-14 DIAGNOSIS — M48061 Spinal stenosis, lumbar region without neurogenic claudication: Secondary | ICD-10-CM | POA: Diagnosis present

## 2024-11-14 DIAGNOSIS — R1084 Generalized abdominal pain: Secondary | ICD-10-CM

## 2024-11-14 LAB — CBC
HCT: 40.9 % (ref 36.0–46.0)
Hemoglobin: 13.8 g/dL (ref 12.0–15.0)
MCH: 29.7 pg (ref 26.0–34.0)
MCHC: 33.7 g/dL (ref 30.0–36.0)
MCV: 88 fL (ref 80.0–100.0)
Platelets: 261 K/uL (ref 150–400)
RBC: 4.65 MIL/uL (ref 3.87–5.11)
RDW: 13.2 % (ref 11.5–15.5)
WBC: 8.1 K/uL (ref 4.0–10.5)
nRBC: 0 % (ref 0.0–0.2)

## 2024-11-14 LAB — CREATININE, SERUM
Creatinine, Ser: 1.21 mg/dL — ABNORMAL HIGH (ref 0.44–1.00)
GFR, Estimated: 46 mL/min — ABNORMAL LOW

## 2024-11-14 LAB — VITAMIN B12: Vitamin B-12: 354 pg/mL (ref 180–914)

## 2024-11-14 LAB — TSH: TSH: 5.29 u[IU]/mL — ABNORMAL HIGH (ref 0.350–4.500)

## 2024-11-14 MED ORDER — ZOLPIDEM TARTRATE 10 MG PO TABS: 5.0000 mg | ORAL_TABLET | Freq: Every day | ORAL | Status: DC

## 2024-11-14 MED ORDER — ALUM & MAG HYDROXIDE-SIMETH 200-200-20 MG/5ML PO SUSP: 15.0000 mL | Freq: Three times a day (TID) | ORAL | Status: DC

## 2024-11-14 MED ORDER — BACITRACIN ZINC 500 UNIT/GM EX OINT
TOPICAL_OINTMENT | Freq: Two times a day (BID) | CUTANEOUS | Status: DC
  Administered 2024-11-15 – 2024-11-21 (×10): 31.1111 via TOPICAL
  Filled 2024-11-14: qty 28.4

## 2024-11-14 MED ORDER — ASPIRIN 81 MG PO CHEW: 81.0000 mg | CHEWABLE_TABLET | Freq: Every day | ORAL | Status: DC

## 2024-11-14 MED ORDER — ORAL CARE MOUTH RINSE: 15.0000 mL | OROMUCOSAL | Status: DC | PRN

## 2024-11-14 MED ORDER — ENOXAPARIN SODIUM 40 MG/0.4ML IJ SOSY: 40.0000 mg | PREFILLED_SYRINGE | INTRAMUSCULAR | Status: DC

## 2024-11-14 MED ORDER — LIDOCAINE 4 % EX CREA: TOPICAL_CREAM | Freq: Two times a day (BID) | CUTANEOUS | Status: DC | PRN

## 2024-11-14 MED ORDER — AMLODIPINE BESYLATE 5 MG PO TABS
5.0000 mg | ORAL_TABLET | Freq: Every day | ORAL | Status: DC
  Administered 2024-11-15 – 2024-11-26 (×12): 5 mg via ORAL
  Filled 2024-11-14 (×12): qty 1

## 2024-11-14 MED ORDER — FAMOTIDINE 20 MG PO TABS
20.0000 mg | ORAL_TABLET | Freq: Every day | ORAL | Status: DC
  Administered 2024-11-15 – 2024-11-26 (×12): 20 mg via ORAL
  Filled 2024-11-14 (×12): qty 1

## 2024-11-14 MED ORDER — TRAZODONE HCL 50 MG PO TABS: 50.0000 mg | ORAL_TABLET | Freq: Every evening | ORAL | Status: DC | PRN

## 2024-11-14 MED ORDER — AMLODIPINE BESYLATE 5 MG PO TABS: 5.0000 mg | ORAL_TABLET | Freq: Every day | ORAL | Status: DC

## 2024-11-14 MED ORDER — ZOLPIDEM TARTRATE 10 MG PO TABS
5.0000 mg | ORAL_TABLET | Freq: Every day | ORAL | 0 refills | Status: DC
  Filled 2024-11-14: qty 15, 30d supply, fill #0

## 2024-11-14 MED ORDER — ENOXAPARIN SODIUM 40 MG/0.4ML IJ SOSY
40.0000 mg | PREFILLED_SYRINGE | INTRAMUSCULAR | Status: DC
  Administered 2024-11-14 – 2024-11-19 (×6): 40 mg via SUBCUTANEOUS
  Filled 2024-11-14 (×6): qty 0.4

## 2024-11-14 MED ORDER — ZOLPIDEM TARTRATE 5 MG PO TABS
5.0000 mg | ORAL_TABLET | Freq: Every day | ORAL | Status: DC
  Administered 2024-11-14 – 2024-11-25 (×12): 5 mg via ORAL
  Filled 2024-11-14 (×12): qty 1

## 2024-11-14 MED ORDER — SENNOSIDES-DOCUSATE SODIUM 8.6-50 MG PO TABS
1.0000 | ORAL_TABLET | Freq: Every evening | ORAL | Status: DC | PRN
  Administered 2024-11-17: 1 via ORAL
  Filled 2024-11-14: qty 1

## 2024-11-14 MED ORDER — FAMOTIDINE 20 MG PO TABS: 20.0000 mg | ORAL_TABLET | Freq: Every day | ORAL | Status: DC

## 2024-11-14 MED ORDER — POLYVINYL ALCOHOL 1.4 % OP SOLN: 1.0000 [drp] | OPHTHALMIC | Status: DC | PRN

## 2024-11-14 MED ORDER — ROSUVASTATIN CALCIUM 40 MG PO TABS: 40.0000 mg | ORAL_TABLET | Freq: Every day | ORAL | Status: DC

## 2024-11-14 MED ORDER — DULOXETINE HCL 20 MG PO CPEP: 20.0000 mg | ORAL_CAPSULE | Freq: Every day | ORAL | Status: DC

## 2024-11-14 MED ORDER — ACETAMINOPHEN 500 MG PO TABS
500.0000 mg | ORAL_TABLET | Freq: Four times a day (QID) | ORAL | Status: DC | PRN
  Administered 2024-11-14 – 2024-11-21 (×6): 500 mg via ORAL
  Filled 2024-11-14 (×7): qty 1

## 2024-11-14 MED ORDER — CLOPIDOGREL BISULFATE 75 MG PO TABS
75.0000 mg | ORAL_TABLET | Freq: Every day | ORAL | Status: DC
  Administered 2024-11-15 – 2024-11-26 (×12): 75 mg via ORAL
  Filled 2024-11-14 (×12): qty 1

## 2024-11-14 MED ORDER — DULOXETINE HCL 20 MG PO CPEP
20.0000 mg | ORAL_CAPSULE | Freq: Every day | ORAL | Status: DC
  Administered 2024-11-15 – 2024-11-26 (×12): 20 mg via ORAL
  Filled 2024-11-14 (×12): qty 1

## 2024-11-14 MED ORDER — GABAPENTIN 300 MG PO CAPS: 300.0000 mg | ORAL_CAPSULE | Freq: Three times a day (TID) | ORAL | Status: DC

## 2024-11-14 MED ORDER — CLOPIDOGREL BISULFATE 75 MG PO TABS: 75.0000 mg | ORAL_TABLET | Freq: Every day | ORAL | Status: DC

## 2024-11-14 MED ORDER — HYDROCODONE-ACETAMINOPHEN 5-325 MG PO TABS
1.0000 | ORAL_TABLET | Freq: Two times a day (BID) | ORAL | Status: DC | PRN
  Administered 2024-11-14 – 2024-11-25 (×19): 1 via ORAL
  Filled 2024-11-14 (×19): qty 1

## 2024-11-14 MED ORDER — SENNOSIDES-DOCUSATE SODIUM 8.6-50 MG PO TABS: 1.0000 | ORAL_TABLET | Freq: Every evening | ORAL | Status: DC | PRN

## 2024-11-14 MED ORDER — HYDROCODONE-ACETAMINOPHEN 5-325 MG PO TABS: 1.0000 | ORAL_TABLET | Freq: Two times a day (BID) | ORAL | Status: DC

## 2024-11-14 MED ORDER — LIDOCAINE 4 % EX CREA
TOPICAL_CREAM | Freq: Two times a day (BID) | CUTANEOUS | Status: DC | PRN
  Administered 2024-11-16 – 2024-11-24 (×10): 1 via TOPICAL
  Filled 2024-11-14 (×2): qty 5

## 2024-11-14 MED ORDER — ROSUVASTATIN CALCIUM 20 MG PO TABS
40.0000 mg | ORAL_TABLET | Freq: Every day | ORAL | Status: DC
  Administered 2024-11-15 – 2024-11-26 (×12): 40 mg via ORAL
  Filled 2024-11-14 (×12): qty 2

## 2024-11-14 MED ORDER — GABAPENTIN 300 MG PO CAPS
300.0000 mg | ORAL_CAPSULE | Freq: Three times a day (TID) | ORAL | Status: DC
  Administered 2024-11-14 – 2024-11-26 (×36): 300 mg via ORAL
  Filled 2024-11-14 (×36): qty 1

## 2024-11-14 MED ORDER — ASPIRIN 81 MG PO CHEW
81.0000 mg | CHEWABLE_TABLET | Freq: Every day | ORAL | Status: DC
  Administered 2024-11-15 – 2024-11-26 (×12): 81 mg via ORAL
  Filled 2024-11-14 (×12): qty 1

## 2024-11-14 MED ORDER — ASPIRIN 81 MG PO CHEW: 81.0000 mg | CHEWABLE_TABLET | Freq: Every day | ORAL | Status: AC

## 2024-11-14 NOTE — Progress Notes (Addendum)
 "    Kayla Kayla BROCKS, Kayla Terry  Physician Physical Medicine and Rehabilitation   PMR Pre-admission     Signed   Date of Service: 11/12/2024 12:20 PM  Related encounter: ED to Hosp-Admission (Discharged) from 11/09/2024 in [facility]   Signed     Expand All Collapse All  Show:Clear all [x] Written[x] Templated[x] Copied  Added by: [x] Alison Heron MATSU, RN[x] Kayla Kayla BROCKS, Kayla Terry  [] Hover for details PMR Admission Coordinator Pre-Admission Assessment   Patient: Kayla Terry is an 79 y.o., female MRN: [id number] DOB: 1946-09-15 Height: 5' 3 (160 cm) Weight: 69.4 kg   Insurance Information HMO: yes    PPO:      PCP:      IPA:      80/20:      OTHER:  PRIMARY: BCBS Medicare      Policy#: [id number]      Subscriber: pt CM Name: Harlene      Phone#: [phone]     Fax#: [phone] Pre-Cert#: [id number]  auth for CIR from [place] with [employer] Medicare for admit 11/13/24 with next review date 11/20/24. Updates due to  fax listed above.        Employer:  Benefits:  Phone #: [phone]     Name: 1/13 Eff. Date: 10/31/24     Deduct: none      Out of Pocket Max: $4900      Life Max: none CIR: $400 co pay per day days 1 until 6      SNF: no co pay per day days 1 until 20; $218 co pay per day days 21 until 60 Outpatient: $15 per visit     Co-Pay:  Home Health: 100%      Co-Pay:  DME: 80%     Co-Pay: 20% Providers: in network  SECONDARY: none      Policy#:      Phone#:    Artist:       Phone#:    The Best [employer] for patients in Inpatient Rehabilitation Facilities with attached Privacy Act Statement-Health Care Records was provided and verbally reviewed with: Patient and Family   Emergency Contact Information Contact Information       Name Relation Home Work Mobile    [place] [phone]   [phone]    Seniya, [place] [phone]   [phone]    Freida Pao Daughter     [phone]         Other  Contacts   None on File      Current Medical History  Patient Admitting Diagnosis: CVA   History of Present Illness:  Kayla Terry is a 79 year old right-handed female with history significant for 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  Reportedly independent prior to patient driving with rare use of a cane.   Presented 11/09/2024 with acute onset of right sided weakness right facial droop and dysarthria.   Cranial CT scan showed left basal ganglia subacute lacunar infarct, mild mass effect, no hemorrhage.  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  CTA showed no large vessel occlusion with intracranial atherosclerosis with some progression since 2023 (up to moderate left ACA A2, left MCA distal posterior).  Patient did not receive TNK.  MRI follow-up acute to subacute left basal ganglia infarct with no significant hemorrhagic transformation or mass effect.  Admission chemistries  unremarkable except WBC of 12,000.  Echocardiogram with ejection fraction of 60 to 65% no wall motion abnormalities.  Interrogation of pacemaker showed no A-fib.  Neurology follow-up placed on aspirin  81 mg daily and Plavix  75 mg daily x 3 weeks then aspirin  alone.  Patient was cleared for Lovenox  for DVT prophylaxis.  Tolerating a regular consistency diet.    Complete NIHSS TOTAL: 1   Patient's medical record from [facility] has been reviewed by the rehabilitation admission coordinator and physician.   Past Medical History      Past Medical History:  Diagnosis Date   Allergy     B12 deficiency 04/2020   Cataract     Coronary artery disease     COVID-19 04/22/2021   Diverticulitis      10/19/2022   Fatty liver     GERD (gastroesophageal reflux disease)     Herpes zoster without  complications     Hiatal hernia     Hypertension     Mild mitral regurgitation 2013   Mobitz type 2 second degree atrioventricular block     Pectus excavatum      pectus excavatum deformity resulting in mass effect on the RV by CT 03/2020   Pulmonary nodule     Shingles     Stroke (HCC)      10/19/2022        Has the patient had major surgery during 100 days prior to admission? No   Family History   family history includes Alcohol  abuse in her brother; Cancer in her mother; Leukemia in her mother; Lung cancer in her father; Multiple myeloma in her brother and father; Multiple sclerosis in her sister.   Current Medications [Current Medications]  [Current Medications]    Current Facility-Administered Medications:    acetaminophen  (TYLENOL ) tablet 500 mg, 500 mg, Oral, Q6H PRN, Bender, Emily, Kayla Terry, 500 mg at 11/13/24 1322   alum & mag hydroxide-simeth (MAALOX/MYLANTA) 200-200-20 MG/5ML suspension 15 mL, 15 mL, Oral, Q8H, Nguyen, Diana, Kayla Terry, 15 mL at 11/14/24 0648   amLODipine  (NORVASC ) tablet 5 mg, 5 mg, Oral, Daily, Rihner, Emilie, Kayla Terry, 5 mg at 11/13/24 0845   artificial tears ophthalmic solution 1 drop, 1 drop, Both Eyes, PRN, Reome, Earle J, RPH   aspirin  chewable tablet 81 mg, 81 mg, Oral, Daily, Waddell Aquas A, NP, 81 mg at 11/13/24 0846   clopidogrel  (PLAVIX ) tablet 75 mg, 75 mg, Oral, Daily, Shafer, Devon, NP, 75 mg at 11/13/24 0845   DULoxetine  (CYMBALTA ) DR capsule 20 mg, 20 mg, Oral, Daily, Rihner, Emilie, Kayla Terry, 20 mg at 11/13/24 1100   enoxaparin  (LOVENOX ) injection 40 mg, 40 mg, Subcutaneous, Q24H, Kayla Reyes BROCKS, Kayla Terry, 40 mg at 11/13/24 1322   famotidine  (PEPCID ) tablet 20 mg, 20 mg, Oral, Daily, D'Mello, Rosalyn, Kayla Terry, 20 mg at 11/13/24 0846   gabapentin  (NEURONTIN ) capsule 300 mg, 300 mg, Oral, TID, Juberg, Christopher, Kayla Terry, 300 mg at 11/13/24 2103   HYDROcodone -acetaminophen  (NORCO/VICODIN) 5-325 MG per tablet 1 tablet, 1 tablet, Oral, BID, D'Mello, Rosalyn, Kayla Terry, 1 tablet at  11/13/24 2103   lidocaine  (LMX) 4 % cream, , Topical, BID PRN, Nguyen, Diana, Kayla Terry   Oral care mouth rinse, 15 mL, Mouth Rinse, PRN, Kayla Reyes BROCKS, Kayla Terry   rosuvastatin  (CRESTOR ) tablet 40 mg, 40 mg, Oral, Daily, Shafer, Devon, NP, 40 mg at 11/13/24 0845   senna-docusate (Senokot-S) tablet 1 tablet, 1 tablet, Oral, QHS PRN, D'Mello, Rosalyn, Kayla Terry, 1 tablet at 11/13/24 0846   zolpidem  (AMBIEN ) tablet 5  mg, 5 mg, Oral, QHS, D'Mello, Rosalyn, Kayla Terry, 5 mg at 11/13/24 2104    Patients Current Diet:  Diet Order                  Diet regular Room service appropriate? Yes; Fluid consistency: Thin  Diet effective now                       Precautions / Restrictions Precautions Precautions: Fall Restrictions Weight Bearing Restrictions Per Provider Order: No    Has the patient had 2 or more falls or a fall with injury in the past year? No   Prior Activity Level Limited Community (1-2x/wk): mod I with cane   Prior Functional Level Self Care: Did the patient need help bathing, dressing, using the toilet or eating? Independent   Indoor Mobility: Did the patient need assistance with walking from room to room (with or without device)? Independent   Stairs: Did the patient need assistance with internal or external stairs (with or without device)? Independent   Functional Cognition: Did the patient need help planning regular tasks such as shopping or remembering to take medications? Independent   Patient Information Are you of Hispanic, Latino/a,or Spanish origin?: A. No, not of Hispanic, Latino/a, or Spanish origin What is your race?: A. White Kayla Terry you need or want an interpreter to communicate with a doctor or health care staff?: 0. No   Patient's Response To:  Health Literacy and Transportation Is the patient able to respond to health literacy and transportation needs?: Yes Health Literacy - How often Kayla Terry you need to have someone help you when you read instructions, pamphlets, or other  written material from your doctor or pharmacy?: Never In the past 12 months, has lack of transportation kept you from medical appointments or from getting medications?: No In the past 12 months, has lack of transportation kept you from meetings, work, or from getting things needed for daily living?: No   Home Assistive Devices / Equipment Home Equipment: Agricultural Consultant (2 wheels), Shower seat, Grab bars - tub/shower, Medical Laboratory Scientific Officer - single point   Prior Device Use: Indicate devices/aids used by the patient prior to current illness, exacerbation or injury? Cane prn   Current Functional Level Cognition   Arousal/Alertness: Awake/alert Overall Cognitive Status: Difficult to assess Orientation Level: Oriented X4 Attention: Sustained Sustained Attention: Appears intact Awareness: Impaired Awareness Impairment: Anticipatory impairment, Emergent impairment Behaviors: Perseveration Safety/Judgment: Appears intact    Extremity Assessment (includes Sensation/Coordination)   Upper Extremity Assessment: Generalized weakness, RUE deficits/detail (generally weak bilaterally) RUE Deficits / Details: decr coordintaion with very FM tasks such as handwriting, but legible, difficulty writing in s straight line without visual cue (line).  Lower Extremity Assessment: Defer to PT evaluation     ADLs   Overall ADL's : Needs assistance/impaired Eating/Feeding: Set up, Sitting Grooming: Contact guard assist, Standing, Minimal assistance, Cueing for sequencing Grooming Details (indicate cue type and reason): cues to place toothpaste cap back on brush Upper Body Bathing: Set up, Sitting Lower Body Bathing: Minimal assistance, Sit to/from stand Upper Body Dressing : Set up, Sitting Lower Body Dressing: Minimal assistance, Sit to/from stand Toilet Transfer: Minimal assistance, Rolling walker (2 wheels), Ambulation Toileting- Clothing Manipulation and Hygiene: Minimal assistance, Sit to/from stand Functional mobility  during ADLs: Minimal assistance, Rolling walker (2 wheels)     Mobility   Overal bed mobility: Needs Assistance Bed Mobility: Sit to Supine Supine to sit: Contact guard, HOB elevated, Used rails Sit to  supine: Contact guard assist General bed mobility comments: for safety     Transfers   Overall transfer level: Needs assistance Equipment used: Rolling walker (2 wheels), None Transfers: Sit to/from Stand Sit to Stand: Min assist Bed to/from chair/wheelchair/BSC transfer type:: Step pivot Step pivot transfers: Min assist General transfer comment: min A for balance     Ambulation / Gait / Stairs / Wheelchair Mobility   Ambulation/Gait Ambulation/Gait assistance: Editor, Commissioning (Feet): 70 Feet Assistive device: Rolling walker (2 wheels) Gait Pattern/deviations: Step-through pattern, Decreased stride length, Shuffle, Narrow base of support, Decreased dorsiflexion - right General Gait Details: reports dizziness upon standing; appeared less apprehensive than previously; decr Rt foot clearance and ran into objects on her rt x 6 Gait velocity: Dec Gait velocity interpretation: 1.31 - 2.62 ft/sec, indicative of limited community ambulator Pre-gait activities: weight shift, static march, poor foot clearance.     Posture / Balance Dynamic Sitting Balance Sitting balance - Comments: statically; pt denying dizziness this session Balance Overall balance assessment: Needs assistance Sitting-balance support: Feet supported, Bilateral upper extremity supported Sitting balance-Leahy Scale: Fair Sitting balance - Comments: statically; pt denying dizziness this session Standing balance support: During functional activity, Bilateral upper extremity supported, Reliant on assistive device for balance Standing balance-Leahy Scale: Poor Standing balance comment: hand held support EOb     Special considerations/life events  Fall precautions    Previous Home Environment  Living Arrangements:  Alone  Lives With: Alone Available Help at Discharge: Family, Available 24 hours/day Type of Home: House Home Layout: One level Home Access: Ramped entrance Bathroom Shower/Tub: Associate Professor: Yes Home Care Services: No   Discharge Living Setting Plans for Discharge Living Setting: Patient's home, House Type of Home at Discharge: House Discharge Home Layout: One level Discharge Home Access: Ramped entrance Discharge Bathroom Shower/Tub: Tub/shower unit Discharge Bathroom Toilet: Standard Discharge Bathroom Accessibility: Yes How Accessible: Accessible via walker Does the patient have any problems obtaining your medications?: No   Social/Family/Support Systems Patient Roles: Parent Contact Information: daughter, Darice Anticipated Caregiver: daughter and son Anticipated Caregiver's Contact Information: see contacts Ability/Limitations of Caregiver: no limitations stated Caregiver Availability: 24/7 Discharge Plan Discussed with Primary Caregiver: Yes Is Caregiver In Agreement with Plan?: Yes Does Caregiver/Family have Issues with Lodging/Transportation while Pt is in Rehab?: No   Goals Patient/Family Goal for Rehab: supervision with PT, OT and SLP Expected length of stay: ELOS 7 days Pt/Family Agrees to Admission and willing to participate: Yes Program Orientation Provided & Reviewed with Pt/Caregiver Including Roles  & Responsibilities: Yes   Decrease burden of Care through IP rehab admission: n/a   Possible need for SNF placement upon discharge: not anticipated   Patient Condition: This patient's condition remains as documented in the consult dated 11/12/24, in which the Rehabilitation Physician determined and documented that the patient's condition is appropriate for intensive rehabilitative care in an inpatient rehabilitation facility. Will admit to inpatient rehab today.   Preadmission Screen Completed By:  Alison Heron Lot, RN MSN 11/14/2024 10:28 AM ______________________________________________________________________   Discussed status with Dr. Emeline on 11/14/24 at 1027 and received approval for admission today.   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 Yes  Co-Morbidities requiring supervision/potential complications:  - History of second-degree AV block with permanent pacemaker -Hypertension -CAD -B12 deficiency             -dysphagia Due to bladder management, bowel management, safety, skin/wound care, disease management, medication administration, pain management, and patient education, does the patient require 24 hr/day rehab nursing? Yes Does the patient require coordinated care of a physician, rehab nurse, therapy disciplines of PT, OT, SLP to address physical and functional deficits in the context of the above medical diagnosis(es)? Yes Addressing deficits in the following areas: balance, endurance, locomotion, strength, transferring, bowel/bladder control, bathing, dressing, feeding, grooming, toileting, speech, swallowing, and psychosocial support, +/- cognition Can the patient actively participate in an intensive therapy program of at least 3 hrs of therapy per day at least 5 days per week? Yes The potential for patient to make measurable gains while on inpatient rehab is excellent Anticipated functional outcomes upon discharge from inpatient rehab are modified independent  with PT, modified independent with OT, modified independent with SLP. Estimated rehab length of stay to reach the above functional goals is: 7-10 days Anticipated discharge destination: Home Overall Rehab/Functional  Prognosis: excellent     Kayla Terry Signature:   Kayla JAYSON Likes, Kayla Terry 11/14/2024              Revision History  Date/Time User Provider Type Action  11/14/2024 10:39 AM Terry Kayla JAYSON, Kayla Terry Physician Sign  11/14/2024 10:28 AM Alison Heron MATSU, RN Rehab Admission Coordinator Share  11/13/2024 12:03 PM Alison Heron MATSU, RN Rehab Admission Coordinator Share  11/12/2024  3:11 PM Alison Heron MATSU, RN Rehab Admission Coordinator Share  11/12/2024  3:11 PM Alison Heron MATSU, RN Rehab Admission Coordinator Share  11/12/2024 12:29 PM Alison Heron MATSU, RN Rehab Admission Coordinator Share   View Details Report      Routing History  "
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
## 2024-11-14 NOTE — Discharge Instructions (Addendum)
 Inpatient Rehab Discharge Instructions  Kayla Terry Discharge date and time: No discharge date for patient encounter.   Activities/Precautions/ Functional Status: Activity: As tolerated Diet: Regular Wound Care: Routine skin checks Functional status:  ___ No restrictions     ___ Walk up steps independently ___ 24/7 supervision/assistance   ___ Walk up steps with assistance ___ Intermittent supervision/assistance  ___ Bathe/dress independently ___ Walk with walker     _x__ Bathe/dress with assistance ___ Walk Independently    ___ Shower independently ___ Walk with assistance    ___ Shower with assistance ___ No alcohol      ___ Return to work/school ________  Special Instructions: No driving smoking or alcohol    My questions have been answered and I understand these instructions. I will adhere to these goals and the provided educational materials after my discharge from the hospital.  Patient/Caregiver Signature _______________________________ Date __________  Clinician Signature _______________________________________ Date __________  Please bring this form and your medication list with you to all your follow-up doctor's appointments. STROKE/TIA DISCHARGE INSTRUCTIONS SMOKING Cigarette smoking nearly doubles your risk of having a stroke & is the single most alterable risk factor  If you smoke or have smoked in the last 12 months, you are advised to quit smoking for your health. Most of the excess cardiovascular risk related to smoking disappears within a year of stopping. Ask you doctor about anti-smoking medications [place] Quit Line: 1-800-QUIT NOW Free Smoking Cessation Classes [phone]  CHOLESTEROL Know your levels; limit fat & cholesterol in your diet  Lipid Panel     Component Value Date/Time   CHOL 253 (H) 11/10/2024 0133   TRIG 163 (H) 11/10/2024 0133   HDL 59 11/10/2024 0133   CHOLHDL 4.3 11/10/2024 0133   VLDL 33 11/10/2024 0133   LDLCALC 162 (H) 11/10/2024 0133      Many patients benefit from treatment even if their cholesterol is at goal. Goal: Total Cholesterol (CHOL) less than 160 Goal:  Triglycerides (TRIG) less than 150 Goal:  HDL greater than 40 Goal:  LDL (LDLCALC) less than 100   BLOOD PRESSURE American Stroke Association blood pressure target is less that 120/80 mm/Hg  Your discharge blood pressure is:  BP: 137/71 Monitor your blood pressure Limit your salt and alcohol  intake Many individuals will require more than one medication for high blood pressure  DIABETES (A1c is a blood sugar average for last 3 months) Goal HGBA1c is under 7% (HBGA1c is blood sugar average for last 3 months)  Diabetes: No known diagnosis of diabetes    Lab Results  Component Value Date   HGBA1C 5.3 11/09/2024    Your HGBA1c can be lowered with medications, healthy diet, and exercise. Check your blood sugar as directed by your physician Call your physician if you experience unexplained or low blood sugars.  PHYSICAL ACTIVITY/REHABILITATION Goal is 30 minutes at least 4 days per week  Activity: Increase activity slowly, Therapies: Physical Therapy: Home Health Return to work:  Activity decreases your risk of heart attack and stroke and makes your heart stronger.  It helps control your weight and blood pressure; helps you relax and can improve your mood. Participate in a regular exercise program. Talk with your doctor about the best form of exercise for you (dancing, walking, swimming, cycling).  DIET/WEIGHT Goal is to maintain a healthy weight  Your discharge diet is:  Diet Order             Diet regular Room service appropriate? Yes; Fluid consistency: Thin  Diet effective now                   liquids Your height is:  Height: 5' 3 (160 cm) Your current weight is: Weight: 65.7 kg Your Body Mass Index (BMI) is:  BMI (Calculated): 25.66 Following the type of diet specifically designed for you will help prevent another stroke. Your goal weight  range is:   Your goal Body Mass Index (BMI) is 19-24. Healthy food habits can help reduce 3 risk factors for stroke:  High cholesterol, hypertension, and excess weight.  RESOURCES Stroke/Support Group:  Call [phone]   STROKE EDUCATION PROVIDED/REVIEWED AND GIVEN TO PATIENT Stroke warning signs and symptoms How to activate emergency medical system (call 911). Medications prescribed at discharge. Need for follow-up after discharge. Personal risk factors for stroke. Pneumonia vaccine given: No Flu vaccine given: No My questions have been answered, the writing is legible, and I understand these instructions.  I will adhere to these goals & educational materials that have been provided to me after my discharge from the hospital.

## 2024-11-14 NOTE — TOC Transition Note (Signed)
 Transition of Care [facility]) - Discharge Note   Patient Details  Name: Glendy Barsanti MRN: [id number] Date of Birth: 1946/08/07  [facility] CM/SW Contact:  Almarie CHRISTELLA Goodie, LCSW Phone Number: 11/14/2024, 9:47 AM   Clinical Narrative:   Patient discharging to CIR today. No further inpatient care management needs.    Final next level of care: IP Rehab Facility Barriers to Discharge: Barriers Resolved   Patient Goals and CMS Choice   CMS Medicare.gov Compare Post Acute Care list provided to:: Patient Represenative (must comment) Choice offered to / list presented to : Adult Children      Discharge Placement                       Discharge Plan and Services Additional resources added to the After Visit Summary for     Discharge Planning Services: CM Consult Post Acute Care Choice: IP Rehab                               Social Drivers of Health (SDOH) Interventions SDOH Screenings   Food Insecurity: No Food Insecurity (11/09/2024)  Housing: Low Risk (11/09/2024)  Transportation Needs: No Transportation Needs (11/09/2024)  Utilities: Not At Risk (11/09/2024)  Social Connections: Unknown (11/13/2024)  Tobacco Use: Low Risk (11/10/2024)     Readmission Risk Interventions    10/21/2022   11:53 AM  Readmission Risk Prevention Plan  Post Dischage Appt Complete  Medication Screening Complete  Transportation Screening Complete
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
## 2024-11-14 NOTE — Progress Notes (Signed)
 Inpatient Rehabilitation Admission Medication Review by a Pharmacist  A complete drug regimen review was completed for this patient to identify any potential clinically significant medication issues.  High Risk Drug Classes Is patient taking? Indication by Medication  Antipsychotic No   Anticoagulant Yes Lovenox  - VTE ppx   Antibiotic No   Opioid Yes Vicodin prn pain  Antiplatelet Yes Aspirin , clopidogrel  - CVA  Hypoglycemics/insulin No   Vasoactive Medication Yes Amlodipine  - HTN  Chemotherapy No   Other Yes Duloxetine  - mood Famotidine  - reflux Gabapentin , Lidocaine  patch- pain Rosuvastatin  - HLD Zolpidem  prn sleep     Type of Medication Issue Identified Description of Issue Recommendation(s)  Drug Interaction(s) (clinically significant)     Duplicate Therapy     Allergy     No Medication Administration End Date     Incorrect Dose     Additional Drug Therapy Needed     Significant med changes from prior encounter (inform family/care partners about these prior to discharge).    Other       Clinically significant medication issues were identified that warrant physician communication and completion of prescribed/recommended actions by midnight of the next day:  No  Name of provider notified for urgent issues identified:   Provider Method of Notification:     Pharmacist comments: None  Time spent performing this drug regimen review (minutes):  20 minutes  Thank you. Olam Monte, PharmD

## 2024-11-14 NOTE — Progress Notes (Incomplete)
 "  HD#5 SUBJECTIVE:  Patient Summary: Kayla Terry is a 79 y.o. with a pertinent PMH of T2DM Mobitz s/p PPM, prior CVA in 2023 without residual deficits, chronic pain due to spinal stenosis, and HTN, who presented with new dysarthria. Code stroke was called and patient was found to have subacute left basal ganglia infarct on CT and later confirmed on MRI pending CIR.   Overnight Events: no overnight events  Interim History: Afebrile, respiratory rate 18, heart rate documented 106  OBJECTIVE:  Vital Signs: Vitals:   11/13/24 1604 11/13/24 1950 11/14/24 0105 11/14/24 0335  BP: (!) 166/97 (!) 170/90 135/67 (!) 150/90  Pulse: 77 83 68 89  Resp: 18 17 20 18   Temp: (!) 97.3 F (36.3 C) 98.1 F (36.7 C) 98.1 F (36.7 C) 98.2 F (36.8 C)  TempSrc: Oral Oral  Oral  SpO2: 99% 97% 96% 99%  Weight:      Height:       Supplemental O2: Room Air SpO2: 99 %  Filed Weights   11/09/24 1000  Weight: 69.4 kg     Intake/Output Summary (Last 24 hours) at 11/14/2024 0650 Last data filed at 11/13/2024 9171 Gross per 24 hour  Intake --  Output 300 ml  Net -300 ml    Net IO Since Admission: 80 mL [11/14/24 0650]  Physical Exam: Physical Exam Constitutional:      General: She is not in acute distress.    Appearance: She is not ill-appearing, toxic-appearing or diaphoretic.  Eyes:     Pupils: Pupils are equal, round, and reactive to light.  Cardiovascular:     Rate and Rhythm: Normal rate and regular rhythm.     Heart sounds: Normal heart sounds. No murmur heard.    No friction rub. No gallop.  Pulmonary:     Effort: Pulmonary effort is normal. No respiratory distress.     Breath sounds: Normal breath sounds. No stridor. No wheezing, rhonchi or rales.  Abdominal:     General: Bowel sounds are normal.     Palpations: Abdomen is soft.     Tenderness: There is no abdominal tenderness.  Musculoskeletal:     Right lower leg: No edema.     Left lower leg: No edema.  Skin:    General:  Skin is warm.  Neurological:     Mental Status: She is alert.     Cranial Nerves: No cranial nerve deficit.     Sensory: No sensory deficit.     Motor: Weakness (4+/5 RUE and RLE. 5/5 LUE and LLE.) present.     Comments: Slight aphasia present, stable from 1/13      Patient Lines/Drains/Airways Status     Active Line/Drains/Airways     Name Placement date Placement time Site Days   Peripheral IV 11/13/24 22 G Anterior;Left Forearm 11/13/24  1253  Forearm  1            Pertinent labs and imaging:      Latest Ref Rng & Units 11/12/2024    2:20 AM 11/10/2024    1:33 AM 11/09/2024   10:34 AM  CBC  WBC 4.0 - 10.5 K/uL 7.4  7.0    Hemoglobin 12.0 - 15.0 g/dL 86.5  87.6  87.3   Hematocrit 36.0 - 46.0 % 39.4  36.5  37.0   Platelets 150 - 400 K/uL 276  246         Latest Ref Rng & Units 11/12/2024    2:20 AM 11/11/2024  1:52 AM 11/10/2024    1:33 AM  CMP  Glucose 70 - 99 mg/dL 899  95  95   BUN 8 - 23 mg/dL 16  17  15    Creatinine 0.44 - 1.00 mg/dL 9.05  8.93  9.08   Sodium 135 - 145 mmol/L 139  139  140   Potassium 3.5 - 5.1 mmol/L 3.7  3.8  3.3   Chloride 98 - 111 mmol/L 102  103  101   CO2 22 - 32 mmol/L 26  26  28    Calcium  8.9 - 10.3 mg/dL 9.2  9.1  9.1     No results found.   ASSESSMENT/PLAN:  Assessment: Principal Problem:   Acute stroke due to ischemia [facility]) Active Problems:   Benign essential hypertension   Hyperlipidemia   Restless leg syndrome   Leukocytosis   Spinal stenosis of lumbar region   Second degree AV block, Mobitz type II   History of stroke   Cerebrovascular accident (CVA) (HCC)   Secondary hypertension   Plan: #Subacute left basal ganglia infarct  #Mild mass effect 2/2 CVA Symptoms stable. Neurology following and recommending:  - continue DAPT with ASA 81 mg and clopidogrel  75 mg daily for 3 weeks then ASA alone.  - Risk factors:   - A1C 5.3, at goal  - LDL goal <70, current LDL 837 continue rosuvastatin  40 mg  - Echo without  shunt, EF 60-65%.  - Interrogate pacemaker: no a. fib - PT/OT consulted. Recommending CIR (see below)   #Dizziness  Met POTS criteria (>30 BPM increase from laying to standing, negative orthostatics). Per PT note, patient still experiencing dizziness. Vestibular assessment was negative. Will continue to monitor symptoms, blood pressure, and work with PT. Still having slight dizziness with change in position that then subsides.  - pending CIR.  No blood placement 1/15 or 1/16 - consider further POTS workup outpatient - encourage oral fluids  #HTN Home meds included amlodipine  5 mg and losartan  50 mg, both PRN. BP stable.  - amlodipine  5 mg daily   #Chronic pain  #Neuropathy  Home medications: gabapentin  300-600 mg TID, hydrocodone -acetaminophen  5-325 mg BID PRN. Patient has complained of worsening leg pain to nursing. Discussed with patient starting Cymbalta  and patient stated okay with initiation.  - Cymbalta  20 mg started, will titrate up.  - Gapapentin 300 mg BID PRN ordered  - hydrocodone -acetaminophen  5-325 mg BID PRN  #Chest Pain  Likely 2/2 GERD. ACS workup negative. Still complaining of mild stomach burning. Having regular bowel movements, soft.  - continue famotidine  20 mg daily  - Maalox scheduled   #Insomnia  Home medications: Trazodone  100 mg PRN and Zolpidem  10 mg at bedtime. Decreased dose given age. Trazodone  could not be confirmed by family.  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 Neurology was informed. MRI 1/12. Interrogation did not show afib.    Best Practice: Diet: Regular diet VTE: enoxaparin  (LOVENOX ) injection 40 mg Start: 11/12/24 1300 Code: DNR-limited   Disposition planning: Therapy Recs: CIR Family Contact: daughter DISPO: Anticipated discharge pending CIR eval  Signature:  Kayla Terry Kayla Terry  Internal Medicine Residency  6:50 AM, 11/14/2024  On Call pager [phone]  "

## 2024-11-14 NOTE — Discharge Instructions (Addendum)
 Thank you for allowing us  to be part of your care. You were hospitalized for stroke. Your MRI confirmed left basal ganglia stroke. Inpatient rehabilitation has been recommended to help build your strength.   See the changes in your medications and management of your chronic conditions below:  *For your Stroke -We have STARTED you on these following medications:  - Aspirin  81 mg and Clopidogrel  75 mg daily for 3 weeks. THEN you will stop the clopidogrel  and continue ASA alone.   - Crestor  40 mg daily   *For your Leg Pain:  - Continue your gabapentin  300 mg TID  - start taking cymbalta  20 mg daily   *For your Sleep -We have changed your medications as followed:               -STOP taking trazodone               - STOP taking Zolpidem  10 mg  - START taking Zolpidem  5 mg   *For your stomach  - You can start taking              - Pepcid  20 mg daily   *For your Blood Pressure  - You can take              - Amlodipine  5 mg daily   FOLLOW UP APPOINTMENTS: Please follow up with your primary care doctor in 1 week to discuss your hospitalization.   Please follow up with the neurology office with in 1 week to discuss your hospitalization.   Please call your PCP or our clinic if you have any questions or concerns, we may be able to help and keep you from a long and expensive emergency room wait. Our clinic and after hours phone number is [phone]. The best time to call is Monday through Friday 9 am to 4 pm but there is always someone available 24/7 if you have an emergency. If you need medication refills please notify your pharmacy one week in advance and they will send us  a request.   We are glad you are feeling better,  Sallyanne Primas Internal Medicine Inpatient Teaching Service at [facility]
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
## 2024-11-14 NOTE — Progress Notes (Signed)
" ° °  Inpatient Rehabilitation Admissions Coordinator   I have CIR bed and can admit today. I will make the arrangements.  Heron Leavell, RN, MSN Rehab Admissions Coordinator [phone] 11/14/2024 9:44 AM  "

## 2024-11-15 ENCOUNTER — Encounter (HOSPITAL_COMMUNITY): Payer: Self-pay | Admitting: Physical Medicine & Rehabilitation

## 2024-11-15 DIAGNOSIS — I639 Cerebral infarction, unspecified: Secondary | ICD-10-CM | POA: Diagnosis not present

## 2024-11-15 LAB — CBC WITH DIFFERENTIAL/PLATELET
Abs Immature Granulocytes: 0.02 K/uL (ref 0.00–0.07)
Basophils Absolute: 0.1 K/uL (ref 0.0–0.1)
Basophils Relative: 1 %
Eosinophils Absolute: 0.4 K/uL (ref 0.0–0.5)
Eosinophils Relative: 5 %
HCT: 40.3 % (ref 36.0–46.0)
Hemoglobin: 13.7 g/dL (ref 12.0–15.0)
Immature Granulocytes: 0 %
Lymphocytes Relative: 14 %
Lymphs Abs: 1 K/uL (ref 0.7–4.0)
MCH: 29.7 pg (ref 26.0–34.0)
MCHC: 34 g/dL (ref 30.0–36.0)
MCV: 87.4 fL (ref 80.0–100.0)
Monocytes Absolute: 0.6 K/uL (ref 0.1–1.0)
Monocytes Relative: 9 %
Neutro Abs: 5 K/uL (ref 1.7–7.7)
Neutrophils Relative %: 71 %
Platelets: 241 K/uL (ref 150–400)
RBC: 4.61 MIL/uL (ref 3.87–5.11)
RDW: 13.2 % (ref 11.5–15.5)
WBC: 7.1 K/uL (ref 4.0–10.5)
nRBC: 0 % (ref 0.0–0.2)

## 2024-11-15 LAB — COMPREHENSIVE METABOLIC PANEL WITH GFR
ALT: 12 U/L (ref 0–44)
AST: 16 U/L (ref 15–41)
Albumin: 4.1 g/dL (ref 3.5–5.0)
Alkaline Phosphatase: 90 U/L (ref 38–126)
Anion gap: 9 (ref 5–15)
BUN: 20 mg/dL (ref 8–23)
CO2: 26 mmol/L (ref 22–32)
Calcium: 9.1 mg/dL (ref 8.9–10.3)
Chloride: 99 mmol/L (ref 98–111)
Creatinine, Ser: 0.94 mg/dL (ref 0.44–1.00)
GFR, Estimated: >60 mL/min
Glucose, Bld: 139 mg/dL — ABNORMAL HIGH (ref 70–99)
Potassium: 4.3 mmol/L (ref 3.5–5.1)
Sodium: 133 mmol/L — ABNORMAL LOW (ref 135–145)
Total Bilirubin: 0.4 mg/dL (ref 0.0–1.2)
Total Protein: 6.7 g/dL (ref 6.5–8.1)

## 2024-11-15 NOTE — Progress Notes (Signed)
 Inpatient Rehabilitation  Patient information reviewed and entered into eRehab system by [facility]. Karen Kays., CCC/SLP, PPS Coordinator.  Information including medical coding, functional ability and quality indicators will be reviewed and updated through discharge.

## 2024-11-15 NOTE — Evaluation (Signed)
 Occupational Therapy Assessment and Plan  Patient Details  Name: Kayla Terry MRN: [id number] Date of Birth: February 20, 1946  OT Diagnosis: acute pain, ataxia, cognitive deficits, lumbago (low back pain), and muscle weakness (generalized) Rehab Potential: Rehab Potential (ACUTE ONLY): Good ELOS: 7-10   Today's Date: 11/15/2024 OT Individual Time: 9066-8954 OT Individual Time Calculation (min): 72 min     Hospital Problem: Principal Problem:   Infarction of left basal ganglia (HCC)   Past Medical History:  Past Medical History:  Diagnosis Date   Allergy    B12 deficiency 04/2020   Cataract    Coronary artery disease    COVID-19 04/22/2021   Diverticulitis    10/19/2022   Fatty liver    GERD (gastroesophageal reflux disease)    Herpes zoster without complications    Hiatal hernia    Hypertension    Mild mitral regurgitation 2013   Mobitz type 2 second degree atrioventricular block    Pectus excavatum    pectus excavatum deformity resulting in mass effect on the RV by CT 03/2020   Pulmonary nodule    Shingles    Stroke (HCC)    10/19/2022   Past Surgical History:  Past Surgical History:  Procedure Laterality Date   ABDOMINAL HYSTERECTOMY  02/1984   BACK SURGERY     CATARACT EXTRACTION, BILATERAL     COLONOSCOPY WITH PROPOFOL  N/A 01/17/2023   Procedure: COLONOSCOPY WITH PROPOFOL ;  Surgeon: Leigh Elspeth SQUIBB, MD;  Location: [facility];  Service: Gastroenterology;  Laterality: N/A;   LEFT HEART CATH AND CORONARY ANGIOGRAPHY N/A 04/22/2021   Procedure: LEFT HEART CATH AND CORONARY ANGIOGRAPHY;  Surgeon: Verlin Lonni BIRCH, MD;  Location: [facility];  Service: Cardiovascular;  Laterality: N/A;   LUMBAR LAMINECTOMY     OOPHORECTOMY  02/29/1984   unilateral   PACEMAKER IMPLANT N/A 04/05/2024   Procedure: PACEMAKER IMPLANT;  Surgeon: Cindie Ole DASEN, MD;  Location: [facility];  Service: Cardiovascular;  Laterality: N/A;   POLYPECTOMY  01/17/2023    Procedure: POLYPECTOMY;  Surgeon: Leigh Elspeth SQUIBB, MD;  Location: [facility];  Service: Gastroenterology;;   TEMPORARY PACEMAKER N/A 04/04/2024   Procedure: TEMPORARY PACEMAKER;  Surgeon: Elmira Newman PARAS, MD;  Location: [facility];  Service: Cardiovascular;  Laterality: N/A;    Assessment & Plan Clinical Impression: Patient is a 79 y.o. yright-handed female with history significant for 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 Ole Cindie, CAD/B12 deficiency, prior CVA 2023 without residual deficits, chronic pain due to spinal stenosis status post lumbar laminectomy maintained on hydrocodone , hypertension. Per chart review patient lives alone. 1 level home with ramped entrance. Reportedly independent prior to patient driving with rare use of a cane. She does have good family support. Presented 11/09/2024 with acute onset of right sided weakness right facial droop and dysarthria. Cranial CT scan showed left basal ganglia subacute lacunar infarct, mild mass effect, no hemorrhage. 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 CTA showed no large vessel occlusion with intracranial atherosclerosis with some progression since 2023 (up to moderate left ACA A2, left MCA distal posterior). Patient did not receive TNK. MRI follow-up acute to subacute left basal ganglia infarct with no significant hemorrhagic transformation or mass effect. Admission chemistries unremarkable except WBC of 12,000. Echocardiogram with ejection fraction of 60 to 65% no wall motion abnormalities. Interrogation of pacemaker showed no A-fib. Neurology follow-up placed on aspirin  81 mg daily and Plavix  75 mg daily x 3 weeks then  aspirin  alone. Patient was cleared for Lovenox  for DVT prophylaxis. Tolerating a regular consistency diet. Therapy evaluations completed due to patient's decreased functional mobility was admitted for a comprehensive rehab program.   Patient currently requires min  with basic self-care skills secondary to muscle weakness, decreased cardiorespiratoy endurance, decreased coordination, decreased initiation, decreased problem solving, decreased safety awareness, and delayed processing, and decreased standing balance and decreased balance strategies.  Prior to hospitalization, patient could complete ADLS with modified independent .  Patient will benefit from skilled intervention to decrease level of assist with basic self-care skills and increase independence with basic self-care skills prior to discharge home with care partner.  Anticipate patient will require 24 hour supervision and follow up home health.  OT - End of Session Activity Tolerance: Tolerates 30+ min activity with multiple rests OT Assessment Rehab Potential (ACUTE ONLY): Good OT Barriers to Discharge: Lack of/limited family support OT Patient demonstrates impairments in the following area(s): Balance;Behavior;Cognition;Vision;Motor;Endurance;Perception;Safety;Pain OT Basic ADL's Functional Problem(s): Bathing;Dressing;Toileting;Grooming OT Advanced ADL's Functional Problem(s): Simple Meal Preparation;Laundry;Light Housekeeping OT Transfers Functional Problem(s): Toilet;Tub/Shower OT Additional Impairment(s): None OT Plan OT Intensity: Minimum of 1-2 x/day, 45 to 90 minutes OT Frequency: 5 out of 7 days OT Duration/Estimated Length of Stay: 7-10 OT Treatment/Interventions: Balance/vestibular training;Disease mangement/prevention;Self Care/advanced ADL retraining;Neuromuscular re-education;Therapeutic Exercise;Wheelchair propulsion/positioning;UE/LE Strength taining/ROM;Skin care/wound managment;Pain management;DME/adaptive equipment instruction;Cognitive remediation/compensation;UE/LE Coordination activities;Splinting/orthotics;Patient/family education;Community reintegration;Discharge planning;Functional mobility training;Therapeutic Activities;Psychosocial support;Visual/perceptual  remediation/compensation OT Self Feeding Anticipated Outcome(s): mod I OT Basic Self-Care Anticipated Outcome(s): SUP to mod I OT Toileting Anticipated Outcome(s): SUP to mod I OT Bathroom Transfers Anticipated Outcome(s): SUP to mod I OT Recommendation Recommendations for Other Services: None Patient destination: Home Follow Up Recommendations: 24 hour supervision/assistance Equipment Recommended: Tub/shower seat   OT Evaluation Precautions/Restrictions  Precautions Precautions: Fall Recall of Precautions/Restrictions: Impaired Restrictions Weight Bearing Restrictions Per Provider Order: No General Chart Reviewed: Yes PT Missed Treatment Reason: Not applicable Response to Previous Treatment: Not applicable Family/Caregiver Present: No Vital Signs   Pain Pain Assessment Pain Scale: 0-10 Pain Score: 10-Worst pain ever Pain Type: Chronic pain Pain Location: Leg Pain Descriptors / Indicators: Aching Pain Onset: On-going Pain Intervention(s): Medication (See eMAR) Home Living/Prior Functioning Home Living Living Arrangements: Alone Available Help at Discharge: Family, Available 24 hours/day Type of Home: House Home Access: Ramped entrance Home Layout: One level Bathroom Shower/Tub: Engineer, Manufacturing Systems: Standard Bathroom Accessibility: Yes  Lives With: Alone IADL History Homemaking Responsibilities: Yes Current License: Yes Mode of TransportationGames Developer Education: high school Occupation: Retired Prior Function Level of Independence: Independent with basic ADLs, Independent with homemaking with ambulation  Able to Take Stairs?: Yes Driving: Yes Vocation: Retired Administrator, Sports Baseline Vision/History: 0 No visual deficits;1 Wears glasses (readers) Ability to See in Adequate Light: 0 Adequate Patient Visual Report: Other (comment) (patient had nausea with eye movemets) Vision Assessment?: Wears glasses for reading;Vision impaired- to be further tested in  functional context;Yes Eye Alignment: Within Functional Limits Tracking/Visual Pursuits: Able to track stimulus in all quads without difficulty Additional Comments: pt demonstrates difficulty followig visual stimulus with consistent closing of eyes Perception  Perception: Impaired Praxis Praxis: WFL Cognition Cognition Overall Cognitive Status: No family/caregiver present to determine baseline cognitive functioning (comingled with mild language deficits, difficult to parse) Arousal/Alertness: Awake/alert Memory: Impaired Memory Impairment: Storage deficit;Retrieval deficit Attention: Sustained Sustained Attention: Appears intact Awareness: Impaired Awareness Impairment: Anticipatory impairment;Emergent impairment Problem Solving: Impaired Problem Solving Impairment: Verbal basic;Functional basic Executive Function: Sequencing;Organizing Sequencing: Impaired Sequencing Impairment: Verbal basic;Functional basic Organizing: Impaired Organizing Impairment:  Verbal basic;Functional basic Behaviors: Perseveration;Impulsive Safety/Judgment: Impaired Brief Interview for Mental Status (BIMS) Repetition of Three Words (First Attempt): 3 Temporal Orientation: Year: Correct Temporal Orientation: Month: Accurate within 5 days Temporal Orientation: Day: Correct Recall: Sock: Yes, no cue required Recall: Blue: Yes, no cue required Recall: Bed: Yes, no cue required BIMS Summary Score: 15 Sensation Sensation Light Touch: Impaired by gross assessment Coordination Gross Motor Movements are Fluid and Coordinated: No Fine Motor Movements are Fluid and Coordinated: No Finger Nose Finger Test: decreased speed but  WFL Motor  Motor Motor: Ataxia;Within Functional Limits Motor - Skilled Clinical Observations: coordination slightly decreased during movements however wfl  Trunk/Postural Assessment  Cervical Assessment Cervical Assessment: Exceptions to [facility] HEAD) Thoracic  Assessment Thoracic Assessment: Exceptions to [facility] (rounded shoulders) Lumbar Assessment Lumbar Assessment: Within Functional Limits Postural Control Postural Control: Within Functional Limits  Balance Balance Balance Assessed: Yes Dynamic Sitting Balance Dynamic Sitting - Balance Support: Feet supported Dynamic Sitting - Level of Assistance: 5: Stand by assistance Static Standing Balance Static Standing - Balance Support: During functional activity Static Standing - Level of Assistance: 5: Stand by assistance Dynamic Standing Balance Dynamic Standing - Balance Support: During functional activity Dynamic Standing - Level of Assistance: 4: Min assist Extremity/Trunk Assessment RUE Assessment RUE Assessment: Exceptions to [facility] Active Range of Motion (AROM) Comments: WFL General Strength Comments: 4-/5 LUE Assessment LUE Assessment: Within Functional Limits General Strength Comments: 4+/5  Care Tool Care Tool Self Care Eating   Eating Assist Level: Supervision/Verbal cueing    Oral Care    Oral Care Assist Level: Supervision/Verbal cueing    Bathing   Body parts bathed by patient: Right arm;Left upper leg;Left arm;Right lower leg;Chest;Abdomen;Left lower leg;Front perineal area;Face;Buttocks;Right upper leg Body parts bathed by helper: Buttocks   Assist Level: Minimal Assistance - Patient > 75%    Upper Body Dressing(including orthotics)   What is the patient wearing?: Pull over shirt   Assist Level: Supervision/Verbal cueing    Lower Body Dressing (excluding footwear)   What is the patient wearing?: Underwear/pull up;Pants Assist for lower body dressing: Minimal Assistance - Patient > 75%    Putting on/Taking off footwear   What is the patient wearing?: Socks Assist for footwear: Minimal Assistance - Patient > 75%       Care Tool Toileting Toileting activity   Assist for toileting: Minimal Assistance - Patient > 75%     Care Tool Bed Mobility Roll left and  right activity   Roll left and right assist level: Supervision/Verbal cueing    Sit to lying activity   Sit to lying assist level: Supervision/Verbal cueing    Lying to sitting on side of bed activity   Lying to sitting on side of bed assist level: the ability to move from lying on the back to sitting on the side of the bed with no back support.: Supervision/Verbal cueing     Care Tool Transfers Sit to stand transfer   Sit to stand assist level: Supervision/Verbal cueing    Chair/bed transfer   Chair/bed transfer assist level: Contact Guard/Touching assist     Toilet transfer   Assist Level: Minimal Assistance - Patient > 75%     Care Tool Cognition  Expression of Ideas and Wants Expression of Ideas and Wants: 3. Some difficulty - exhibits some difficulty with expressing needs and ideas (e.g, some words or finishing thoughts) or speech is not clear  Understanding Verbal and Non-Verbal Content Understanding Verbal and Non-Verbal Content: 3. Usually  understands - understands most conversations, but misses some part/intent of message. Requires cues at times to understand   Memory/Recall Ability Memory/Recall Ability : That he or she is in a hospital/hospital unit;Current season   Refer to Care Plan for Long Term Goals  SHORT TERM GOAL WEEK 1 OT Short Term Goal 1 (Week 1): STG=LTG d/t ELOS  Recommendations for other services: None    Skilled Therapeutic Intervention  Evaluation completed (see details above) with education on OT POC and goals and individual treatment initiated with focus on functional mobility/transfers, ADL re-training,  generalized strengthening and endurance, dynamic standing balance/coordination, pt/family education and discharge planning. Pt education provided on therapy schedule and safety policy with use of chair alarm. Pt participated in goal setting. Pt participated in modified ADL task (See above for functional performance). Patient received in bed. 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 Patient required several cues for self care with levels below. Patient demonstrated ability to complete transfers in both directions with CGA, min for mobility.    ADL ADL Eating: Set up Grooming: Supervision/safety Where Assessed-Grooming: Sitting at sink Upper Body Bathing: Supervision/safety;Minimal cueing;Moderate cueing Where Assessed-Upper Body Bathing: Sitting at sink Lower Body Bathing: Supervision/safety;Moderate cueing;Minimal cueing Where Assessed-Lower Body Bathing: Sitting at sink;Standing at sink Upper Body Dressing: Supervision/safety Lower Body Dressing: Contact guard;Minimal cueing Where Assessed-Lower Body Dressing: Edge of bed Toileting: Contact guard Where Assessed-Toileting: Teacher, Adult Education: Scientific Laboratory Technician Method: Proofreader: Engineer, Manufacturing Systems Transfer: Insurance Underwriter Method: Stand pivot;Ambulating Walk-In Shower Equipment: Grab bars Mobility  Bed Mobility Bed Mobility: Rolling Right;Rolling Left;Supine to Sit;Sit to Supine Rolling Right: Supervision/verbal cueing Rolling Left: Supervision/Verbal cueing Supine to Sit: Contact Guard/Touching assist Sit to Supine: Contact Guard/Touching assist Transfers Sit to Stand: Contact Guard/Touching assist Stand to Sit: Contact Guard/Touching assist   Discharge Criteria: Patient will be discharged from OT if patient refuses treatment 3 consecutive times without medical reason, if treatment goals not met, if there is a change in medical status, if patient makes no progress towards goals or if patient is discharged from hospital.  The above assessment, treatment plan, treatment alternatives and goals were discussed and mutually agreed upon: by patient  Kayla Terry 11/15/2024, 12:48 PM

## 2024-11-15 NOTE — Plan of Care (Signed)
" °  Problem: RH Comprehension Communication Goal: LTG Patient will comprehend basic/complex auditory (SLP) Description: LTG: Patient will comprehend basic/complex auditory information with cues (SLP). Flowsheets (Taken 11/15/2024 0908) LTG: Patient will comprehend: (mildly complex) -- LTG: Patient will comprehend auditory information with cueing (SLP): Supervision   Problem: RH Expression Communication Goal: LTG Patient will increase word finding of common (SLP) Description: LTG:  Patient will increase word finding of common objects/daily info/abstract thoughts with cues using compensatory strategies (SLP). Flowsheets (Taken 11/15/2024 0908) LTG: Patient will increase word finding of common (SLP): Supervision Patient will use compensatory strategies to increase word finding of: (mildly complex conversation) Other (Comment)   Problem: RH Problem Solving Goal: LTG Patient will demonstrate problem solving for (SLP) Description: LTG:  Patient will demonstrate problem solving for basic/complex daily situations with cues  (SLP) Flowsheets (Taken 11/15/2024 0908) LTG: Patient will demonstrate problem solving for (SLP): Basic daily situations LTG Patient will demonstrate problem solving for: Supervision   "

## 2024-11-15 NOTE — Evaluation (Signed)
 Physical Therapy Assessment and Plan  Patient Details  Name: Kayla Terry MRN: [id number] Date of Birth: 13-Nov-1945  PT Diagnosis: Abnormality of gait, Difficulty walking, Hemiplegia dominant, Impaired cognition, Muscle weakness, and Pain in B feet Rehab Potential: Good ELOS: 7-10 days   Today's Date: 11/15/2024 PT Individual Time: 1300-1415 PT Individual Time Calculation (min): 75 min    Hospital Problem: Principal Problem:   Infarction of left basal ganglia (HCC)   Past Medical History:  Past Medical History:  Diagnosis Date   Allergy    B12 deficiency 04/2020   Cataract    Coronary artery disease    COVID-19 04/22/2021   Diverticulitis    10/19/2022   Fatty liver    GERD (gastroesophageal reflux disease)    Herpes zoster without complications    Hiatal hernia    Hypertension    Mild mitral regurgitation 2013   Mobitz type 2 second degree atrioventricular block    Pectus excavatum    pectus excavatum deformity resulting in mass effect on the RV by CT 03/2020   Pulmonary nodule    Shingles    Stroke (HCC)    10/19/2022   Past Surgical History:  Past Surgical History:  Procedure Laterality Date   ABDOMINAL HYSTERECTOMY  02/1984   BACK SURGERY     CATARACT EXTRACTION, BILATERAL     COLONOSCOPY WITH PROPOFOL  N/A 01/17/2023   Procedure: COLONOSCOPY WITH PROPOFOL ;  Surgeon: Leigh Elspeth SQUIBB, MD;  Location: [facility];  Service: Gastroenterology;  Laterality: N/A;   LEFT HEART CATH AND CORONARY ANGIOGRAPHY N/A 04/22/2021   Procedure: LEFT HEART CATH AND CORONARY ANGIOGRAPHY;  Surgeon: Verlin Lonni BIRCH, MD;  Location: [facility];  Service: Cardiovascular;  Laterality: N/A;   LUMBAR LAMINECTOMY     OOPHORECTOMY  02/29/1984   unilateral   PACEMAKER IMPLANT N/A 04/05/2024   Procedure: PACEMAKER IMPLANT;  Surgeon: Cindie Ole DASEN, MD;  Location: [facility];  Service: Cardiovascular;  Laterality: N/A;   POLYPECTOMY  01/17/2023   Procedure:  POLYPECTOMY;  Surgeon: Leigh Elspeth SQUIBB, MD;  Location: [facility];  Service: Gastroenterology;;   TEMPORARY PACEMAKER N/A 04/04/2024   Procedure: TEMPORARY PACEMAKER;  Surgeon: Elmira Newman PARAS, MD;  Location: [facility];  Service: Cardiovascular;  Laterality: N/A;    Assessment & Plan Clinical Impression: Kayla Terry is a 79 year old right-handed female with history significant for 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 Ole Cindie, CAD/B12 deficiency, prior CVA 2023 without residual deficits, chronic pain due to spinal stenosis status post lumbar laminectomy maintained on hydrocodone , hypertension. Per chart review patient lives alone. 1 level home with ramped entrance. Reportedly independent prior to patient driving with rare use of a cane. She does have good family support. Presented 11/09/2024 with acute onset of right sided weakness right facial droop and dysarthria. Cranial CT scan showed left basal ganglia subacute lacunar infarct, mild mass effect, no hemorrhage. 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 CTA showed no large vessel occlusion with intracranial atherosclerosis with some progression since 2023 (up to moderate left ACA A2, left MCA distal posterior). Patient did not receive TNK. MRI follow-up acute to subacute left basal ganglia infarct with no significant hemorrhagic transformation or mass effect. Admission chemistries unremarkable except WBC of 12,000. Echocardiogram with ejection fraction of 60 to 65% no wall motion abnormalities. Interrogation of pacemaker showed no A-fib. Neurology follow-up placed on aspirin  81 mg daily and Plavix  75 mg daily x 3 weeks then aspirin   alone. Patient was cleared for Lovenox  for DVT prophylaxis. Tolerating a regular consistency diet. Therapy evaluations completed due to patient's decreased functional mobility was admitted for a comprehensive rehab program.   Patient currently requires min  with mobility secondary to muscle weakness and decreased coordination and decreased motor planning.  Prior to hospitalization, patient was independent  with mobility and lived with Alone in a House home.  Home access is 3Stairs to enter.  Patient will benefit from skilled PT intervention to maximize safe functional mobility, minimize fall risk, and decrease caregiver burden for planned discharge home with 24 hour supervision.  Anticipate patient will benefit from follow up OP at discharge.  PT - End of Session Activity Tolerance: Tolerates 30+ min activity with multiple rests;Tolerates 10 - 20 min activity with multiple rests Endurance Deficit: Yes PT Assessment Rehab Potential (ACUTE/IP ONLY): Good PT Barriers to Discharge: Home environment access/layout;Other (comments) PT Barriers to Discharge Comments: Pt living alone PTA, cognitive deficits vs aphasia PT Patient demonstrates impairments in the following area(s): Balance;Pain;Safety;Endurance;Motor PT Transfers Functional Problem(s): Bed Mobility;Bed to Chair;Car;Furniture;Floor PT Locomotion Functional Problem(s): Ambulation;Stairs PT Plan PT Intensity: Minimum of 1-2 x/day ,45 to 90 minutes PT Frequency: 5 out of 7 days PT Duration Estimated Length of Stay: 7-10 days PT Treatment/Interventions: Ambulation/gait training;Community reintegration;Neuromuscular re-education;Stair training;UE/LE Strength taining/ROM;UE/LE Coordination activities;Therapeutic Activities;Discharge planning;Balance/vestibular training;Functional mobility training;Patient/family education;Therapeutic Exercise PT Transfers Anticipated Outcome(s): mod I PT Locomotion Anticipated Outcome(s): supervision/Mod I w/ LRAD PT Recommendation Recommendations for Other Services: Therapeutic Recreation consult Therapeutic Recreation Interventions: Outing/community reintergration Follow Up Recommendations: Outpatient PT Patient destination: Home Equipment Recommended: To be  determined Equipment Details: pt has SPC and rollator   PT Evaluation Precautions/Restrictions Precautions Precautions: Fall Precaution/Restrictions Comments: R inattention Restrictions Weight Bearing Restrictions Per Provider Order: No General   Vital Signs  Pain Pain Assessment Pain Scale: 0-10 Pain Score: 10-Worst pain ever Pain Type: Chronic pain Pain Location: Foot Pain Orientation: Right;Left Pain Descriptors / Indicators: Constant Pain Onset: On-going Pain Intervention(s): Medication (See eMAR) Pain Interference Pain Interference Pain Effect on Sleep: 3. Frequently;2. Occasionally Pain Interference with Therapy Activities: 2. Occasionally Pain Interference with Day-to-Day Activities: 2. Occasionally Home Living/Prior Functioning Home Living Living Arrangements: Alone Available Help at Discharge: Family;Available 24 hours/day Type of Home: House Home Access: Stairs to enter [employer] of Steps: 3 Entrance Stairs-Rails:  (pt states son is adding 2nd rail.) Home Layout: One level Bathroom Shower/Tub: Tub/shower unit  Lives With: Alone Prior Function Level of Independence: Independent with transfers;Independent with gait  Able to Take Stairs?: Yes Driving: Yes Vocation: Retired Vision/Perception  Vision - Assessment Alignment/Gaze Preference: Other (comment) (does miss objects to right unless cued.)  Cognition Overall Cognitive Status: No family/caregiver present to determine baseline cognitive functioning Arousal/Alertness: Awake/alert Orientation Level: Oriented X4 Year: 2026 Month: January Day of Week: Correct Attention: Sustained Sustained Attention: Appears intact Memory: Impaired Memory Impairment: Storage deficit;Retrieval deficit Awareness: Impaired Awareness Impairment: Anticipatory impairment;Emergent impairment Problem Solving: Impaired Problem Solving Impairment: Verbal basic;Functional basic Executive Function:  Sequencing;Organizing Sequencing: Impaired Sequencing Impairment: Verbal basic;Functional basic Organizing: Impaired Organizing Impairment: Verbal basic;Functional basic Behaviors: Impulsive Safety/Judgment: Impaired Sensation Sensation Light Touch: Appears Intact Coordination Gross Motor Movements are Fluid and Coordinated: No Heel Shin Test: difficulty following directions unless manual cueing. Motor  Motor Motor: Within Functional Limits Motor - Skilled Clinical Observations: coordination slightly decreased during movements however wfl RLE   Trunk/Postural Assessment  Cervical Assessment Cervical Assessment: Exceptions to [facility] (forward head) Thoracic Assessment Thoracic Assessment: Exceptions to [facility] (rounded  shoulders) Lumbar Assessment Lumbar Assessment: Within Functional Limits Postural Control Postural Control: Within Functional Limits  Balance Balance Balance Assessed: Yes Static Sitting Balance Static Sitting - Balance Support: Feet supported Static Sitting - Level of Assistance: 5: Stand by assistance;6: Modified independent (Device/Increase time) Dynamic Sitting Balance Dynamic Sitting - Balance Support: Feet supported Dynamic Sitting - Level of Assistance: 5: Stand by assistance Static Standing Balance Static Standing - Balance Support: During functional activity Static Standing - Level of Assistance: 4: Min assist;5: Stand by assistance Dynamic Standing Balance Dynamic Standing - Balance Support: During functional activity Dynamic Standing - Level of Assistance: 4: Min assist;5: Stand by assistance Extremity Assessment      RLE Assessment RLE Assessment: Within Functional Limits General Strength Comments: slightly decreased coordination vs Left LLE Assessment LLE Assessment: Within Functional Limits  Care Tool Care Tool Bed Mobility Roll left and right activity   Roll left and right assist level: Supervision/Verbal cueing    Sit to lying activity    Sit to lying assist level: Supervision/Verbal cueing    Lying to sitting on side of bed activity   Lying to sitting on side of bed assist level: the ability to move from lying on the back to sitting on the side of the bed with no back support.: Supervision/Verbal cueing     Care Tool Transfers Sit to stand transfer   Sit to stand assist level: Contact Guard/Touching assist    Chair/bed transfer   Chair/bed transfer assist level: Minimal Assistance - Patient > 75%    Car transfer   Car transfer assist level: Minimal Assistance - Patient > 75%      Care Tool Locomotion Ambulation   Assist level: Minimal Assistance - Patient > 75% Assistive device: Walker-rolling Max distance: 200  Walk 10 feet activity   Assist level: Minimal Assistance - Patient > 75% Assistive device: Walker-rolling   Walk 50 feet with 2 turns activity   Assist level: Minimal Assistance - Patient > 75% Assistive device: Walker-rolling  Walk 150 feet activity   Assist level: Minimal Assistance - Patient > 75% Assistive device: Walker-rolling  Walk 10 feet on uneven surfaces activity   Assist level: Minimal Assistance - Patient > 75% Assistive device: Walker-rolling  Stairs   Assist level: Minimal Assistance - Patient > 75% Stairs assistive device: 2 hand rails Max number of stairs: 12  Walk up/down 1 step activity   Walk up/down 1 step (curb) assist level: Minimal Assistance - Patient > 75% Walk up/down 1 step or curb assistive device: 2 hand rails  Walk up/down 4 steps activity   Walk up/down 4 steps assist level: Minimal Assistance - Patient > 75% Walk up/down 4 steps assistive device: 2 hand rails  Walk up/down 12 steps activity   Walk up/down 12 steps assist level: Minimal Assistance - Patient > 75% Walk up/down 12 steps assistive device: 2 hand rails  Pick up small objects from floor   Pick up small object from the floor assist level: Minimal Assistance - Patient > 75%    Wheelchair Is the  patient using a wheelchair?: Yes Type of Wheelchair: Manual   Wheelchair assist level: Moderate Assistance - Patient 50 - 74% Max wheelchair distance: 100  Wheel 50 feet with 2 turns activity   Assist Level: Moderate Assistance - Patient 50 - 74%  Wheel 150 feet activity   Assist Level: Maximal Assistance - Patient 25 - 49%    Refer to Care Plan for Long Term Goals  SHORT  TERM GOAL WEEK 1 PT Short Term Goal 1 (Week 1): STG = LTG 2/2 ELOS  Recommendations for other services: Therapeutic Recreation  Outing/community reintegration  Skilled Therapeutic Intervention Evaluation completed (see details above and below) with education on PT POC and goals and individual treatment initiated with focus on  endurance, strengthening, transfers, balance, gait and R inattention.  Pt presents supine in bed and agreeable to therapy although c/o pain to B feet of 10/10.  Pt then had no further c/o pain w/ any mobility.  Pt rolls side to side w/ supervision on flat bed w/o use of rails.  Pt transfers sup<> sit w/ CGA/supervision.  Pt performed sit to stand and SPT bed > w/c w/ min/CGA.  Pt negotates w/c x 100' w/ mod A 2/2 inattention to objects, including wall to right.  Pt amb multiple trials w/o AD and min A up to 131' w/c cues for arm swing.  Pt required min A and cues for car transfer at simulated car height (19.5).  Pt negotiated ramped surface w/ RW and min/CGA, cues for objects to right.  Pt negotiated 12 steps w/ B rails and min A, self-selecting reciprocal pattern ascending and step-to descending, cues for advancing RUE on rail.  Pt amb x 200' w/ RW and CGA to room, cues for looking for room numbers.  Pt required multiple reminders of her room #.  Pt transferred from toilet w/ CGA and managed clothing w/ supervision.  Pt continent of bladder in toilet, charted in Flowsheets.  Pt returned to bed and transferred sit to supine w/ CGA/supervision.  Bed alarm on and all needs in reach, visitor  present.      Mobility Bed Mobility Bed Mobility: Rolling Right;Rolling Left;Sit to Supine;Right Sidelying to Sit Rolling Right: Supervision/verbal cueing Rolling Left: Supervision/Verbal cueing Right Sidelying to Sit: Contact Guard/Touching assist Sit to Supine: Contact Guard/Touching assist Transfers Transfers: Sit to Stand;Stand to Sit;Stand Pivot Transfers Sit to Stand: Contact Guard/Touching assist Stand to Sit: Contact Guard/Touching assist Stand Pivot Transfers: Minimal Assistance - Patient > 75% Stand Pivot Transfer Details: Verbal cues for technique;Verbal cues for precautions/safety Transfer (Assistive device): None Locomotion  Gait Ambulation: Yes Gait Assistance: Minimal Assistance - Patient > 75%;Contact Guard/Touching assist Gait Distance (Feet): 200 Feet Assistive device: Rolling walker Gait Assistance Details: Verbal cues for safe use of DME/AE;Verbal cues for precautions/safety;Other (comment) Gait Assistance Details: cues for objects to Right and looking for room numbers to find room. Gait Gait: Yes Gait Pattern: Step-through pattern;Narrow base of support Gait velocity: Decreased, extended elbows and shrugged shoulders unless cued. Stairs / Additional Locomotion Stairs: Yes Stairs Assistance: Minimal Assistance - Patient > 75% Stair Management Technique: Two rails Number of Stairs: 12 Height of Stairs: 6 Ramp: Minimal Assistance - Patient >75% Curb: Minimal Assistance - Patient >75% Wheelchair Mobility Wheelchair Mobility: Yes Wheelchair Assistance: Moderate Assistance - Patient 50 - 74% Wheelchair Propulsion: Both upper extremities Distance: 110   Discharge Criteria: Patient will be discharged from PT if patient refuses treatment 3 consecutive times without medical reason, if treatment goals not met, if there is a change in medical status, if patient makes no progress towards goals or if patient is discharged from hospital.  The above assessment,  treatment plan, treatment alternatives and goals were discussed and mutually agreed upon: by patient  Reyes SHAUNNA Sierra 11/15/2024, 2:49 PM

## 2024-11-15 NOTE — Progress Notes (Signed)
 "                                                        PROGRESS NOTE   Subjective/Complaints:  No issues overnite , exp aphasia working with SLP ROS- aphasia limits ROS Objective:   No results found. Recent Labs    11/14/24 1517  WBC 8.1  HGB 13.8  HCT 40.9  PLT 261   Recent Labs    11/14/24 1517  CREATININE 1.21*    Intake/Output Summary (Last 24 hours) at 11/15/2024 0726 Last data filed at 11/14/2024 1800 Gross per 24 hour  Intake 0 ml  Output --  Net 0 ml        Physical Exam: Vital Signs Blood pressure 128/75, pulse 84, temperature 97.8 F (36.6 C), temperature source Oral, resp. rate 19, height 5' 3 (1.6 m), weight 65.7 kg, SpO2 99%.   General: No acute distress Mood and affect are appropriate Heart: Regular rate and rhythm no rubs murmurs or extra sounds Lungs: Clear to auscultation, breathing unlabored, no rales or wheezes Abdomen: Positive bowel sounds, soft nontender to palpation, nondistended Extremities: No clubbing, cyanosis, or edema Skin: No evidence of breakdown, no evidence of rash Neurologic: Cranial nerves II through XII intact, motor strength is 5/5 in Left and 4/5 R deltoid, bicep, tricep, grip, hip flexor, knee extensors, ankle dorsiflexor and plantar flexor Sensory exam normal sensation to light touch  in bilateral upper and lower extremities  Musculoskeletal: Full range of motion in all 4 extremities. No joint swelling   Assessment/Plan: 1. Functional deficits which require 3+ hours per day of interdisciplinary therapy in a comprehensive inpatient rehab setting. Physiatrist is providing close team supervision and 24 hour management of active medical problems listed below. Physiatrist and rehab team continue to assess barriers to discharge/monitor patient progress toward functional and medical goals  Care Tool:  Bathing              Bathing assist       Upper Body Dressing/Undressing Upper body dressing        Upper body  assist      Lower Body Dressing/Undressing Lower body dressing            Lower body assist       Toileting Toileting    Toileting assist       Transfers Chair/bed transfer  Transfers assist           Locomotion Ambulation   Ambulation assist              Walk 10 feet activity   Assist           Walk 50 feet activity   Assist           Walk 150 feet activity   Assist           Walk 10 feet on uneven surface  activity   Assist           Wheelchair     Assist               Wheelchair 50 feet with 2 turns activity    Assist            Wheelchair 150 feet activity     Assist  Blood pressure 128/75, pulse 84, temperature 97.8 F (36.6 C), temperature source Oral, resp. rate 19, height 5' 3 (1.6 m), weight 65.7 kg, SpO2 99%.  Medical Problem List and Plan: 1. Functional deficits secondary to left basal ganglia infarction as well as history of CVA 2023 without residual deficits             -patient may   shower             -ELOS/Goals: 7-10 days, Mod I PT/OT/SLP              - stable for IRF   2.  Antithrombotics: -DVT/anticoagulation:  Pharmaceutical: Lovenox              -antiplatelet therapy: Aspirin  81 mg daily and Plavix  75 mg daily x 3 weeks then aspirin  alone   3. Pain Management, chronic back pain (Severe spinal canal stenosis at L4-L5, Severe right and moderate left neuroforaminal stenosis at L5-S1 w/ ESI 1.8.26): Neurontin  300 mg 3 times daily, Cymbalta  20 mg daily, hydrocodone  1 tablet twice daily PRN              - Home regimen: Norco 5 mg BID + gabapentin  300 mg BID              - Per notes, goal to titrate Duloxetine  to 60 mg for neuropathy  - Patient perseverative on timing of pain medication   4. Mood/Behavior/Sleep: Ambien  5 mg nightly.  Provide emotional support             -antipsychotic agents: N/A              - was on ambien  10 mg at bedtime + ?trazodone  100 mg at  bedtime at home; add trazodone  50 mg PRN               5. Neuropsych/cognition: This patient is capable of making decisions on her own behalf. 6. Skin/Wound Care: Routine skin checks              - R antecubital IV site open w/ erythema - add neosporin 2x daily; monitor closely    7. Fluids/Electrolytes/Nutrition: Routine in and outs with follow-up chemistries 8.  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  Follow-up Dr. Ole Holts.  Interrogation of pacemaker showed no A-fib 9.  Hypertension.  Norvasc  5 mg daily.  Monitor with increased mobility Vitals:   11/14/24 2005 11/15/24 0523  BP: 137/71 128/75  Pulse: 63 84  Resp: 18 19  Temp: 97.8 F (36.6 C) 97.8 F (36.6 C)  SpO2: 97% 99%    10.  Hyperlipidemia.  Crestor  11.  GERD.  Pepcid        LOS: 1 days A FACE TO FACE EVALUATION WAS PERFORMED  Prentice FORBES Compton 11/15/2024, 7:26 AM     "

## 2024-11-15 NOTE — IPOC Note (Signed)
 Overall Plan of Care [facility]) Patient Details Name: Kayla Terry MRN: [id number] DOB: 07/13/1946  Admitting Diagnosis: Infarction of left basal ganglia [facility])  Hospital Problems: Principal Problem:   Infarction of left basal ganglia (HCC)     Functional Problem List: Nursing Safety, Bowel, Endurance, Medication Management, Nutrition, Pain  PT Balance, Pain, Safety, Endurance, Motor  OT Balance, Behavior, Cognition, Vision, Motor, Endurance, Perception, Safety, Pain  SLP Cognition, Linguistic  TR         Basic ADLs: OT Bathing, Dressing, Toileting, Grooming     Advanced  ADLs: OT Simple Meal Preparation, Laundry, Light Housekeeping     Transfers: PT Bed Mobility, Bed to Chair, Car, [employer], Floor  OT Toilet, Tub/Shower     Locomotion: PT Ambulation, Stairs     Additional Impairments: OT None  SLP Communication, Social Cognition comprehension, expression Problem Solving  TR      Anticipated Outcomes Item Anticipated Outcome  Self Feeding mod I  Swallowing      Basic self-care  SUP to mod I  Toileting  SUP to mod I   Bathroom Transfers SUP to mod I  Bowel/Bladder  manage bowel w mod I assist  Transfers  mod I  Locomotion  supervision/Mod I w/ LRAD  Communication  supervision  Cognition  supervision  Pain  Pain < 4 with prns  Safety/Judgment  manage safety w cues   Therapy Plan: PT Intensity: Minimum of 1-2 x/day ,45 to 90 minutes PT Frequency: 5 out of 7 days PT Duration Estimated Length of Stay: 7-10 days OT Intensity: Minimum of 1-2 x/day, 45 to 90 minutes OT Frequency: 5 out of 7 days OT Duration/Estimated Length of Stay: 7-10 SLP Intensity: Minumum of 1-2 x/day, 30 to 90 minutes SLP Frequency: 3 to 5 out of 7 days SLP Duration/Estimated Length of Stay: 7-10 days   Team Interventions: Nursing Interventions Patient/Family Education, Disease Management/Prevention, Discharge Planning, Pain Management, Bowel Management, Medication  Management  PT interventions Ambulation/gait training, Community reintegration, Neuromuscular re-education, Stair training, UE/LE Strength taining/ROM, UE/LE Coordination activities, Therapeutic Activities, Discharge planning, Warden/ranger, Functional mobility training, Patient/family education, Therapeutic Exercise  OT Interventions Balance/vestibular training, Disease mangement/prevention, Self Care/advanced ADL retraining, Neuromuscular re-education, Therapeutic Exercise, Wheelchair propulsion/positioning, UE/LE Strength taining/ROM, Skin care/wound managment, Pain management, DME/adaptive equipment instruction, Cognitive remediation/compensation, UE/LE Coordination activities, Splinting/orthotics, Patient/family education, Community reintegration, Discharge planning, Functional mobility training, Therapeutic Activities, Psychosocial support, Visual/perceptual remediation/compensation  SLP Interventions Cognitive remediation/compensation, Speech/Language facilitation, Cueing hierarchy, Functional tasks, Therapeutic Activities, Internal/external aids, Patient/family education  TR Interventions    SW/CM Interventions Discharge Planning, Psychosocial Support, Patient/Family Education   Barriers to Discharge MD  Medical stability  Nursing Decreased caregiver support 1 level ramped entry w dtr  PT Home environment access/layout, Other (comments) Pt living alone PTA, cognitive deficits vs aphasia  OT Lack of/limited family support    SLP      SW Insurance for SNF coverage     Team Discharge Planning: Destination: PT-Home ,OT- Home , SLP-Home Projected Follow-up: PT-Outpatient PT, OT-  24 hour supervision/assistance, SLP-Outpatient SLP Projected Equipment Needs: PT-To be determined, OT- Tub/shower seat, SLP-None recommended by SLP Equipment Details: PT-pt has SPC and rollator, OT-  Patient/family involved in discharge planning: PT- Patient,  OT-Patient, SLP-Patient  MD ELOS:  7-10d Medical Rehab Prognosis:  Good Assessment: The patient has been admitted for CIR therapies with the diagnosis of infarction of the left basal ganglia. The team will be addressing functional mobility, strength, stamina, balance, safety, adaptive techniques and equipment, self-care,  bowel and bladder mgt, patient and caregiver education, blood pressure management. Goals have been set at supervision to modified independent. Anticipated discharge destination is home with assistance.        See Team Conference Notes for weekly updates to the plan of care

## 2024-11-15 NOTE — Progress Notes (Signed)
 " Inpatient Rehabilitation Care Coordinator Assessment and Plan Patient Details  Name: Kayla Terry MRN: [id number] Date of Birth: [birth year]  Today's Date: 11/15/2024  Hospital Problems: Principal Problem:   Infarction of left basal ganglia [facility])  Past Medical History:  Past Medical History:  Diagnosis Date   Allergy    B12 deficiency 04/2020   Cataract    Coronary artery disease    COVID-19 04/22/2021   Diverticulitis    10/19/2022   Fatty liver    GERD (gastroesophageal reflux disease)    Herpes zoster without complications    Hiatal hernia    Hypertension    Mild mitral regurgitation 2013   Mobitz type 2 second degree atrioventricular block    Pectus excavatum    pectus excavatum deformity resulting in mass effect on the RV by CT 03/2020   Pulmonary nodule    Shingles    Stroke [facility])    10/19/2022   Past Surgical History:  Past Surgical History:  Procedure Laterality Date   ABDOMINAL HYSTERECTOMY  02/1984   BACK SURGERY     CATARACT EXTRACTION, BILATERAL     COLONOSCOPY WITH PROPOFOL  N/A 01/17/2023   Procedure: COLONOSCOPY WITH PROPOFOL ;  Surgeon: Leigh Elspeth SQUIBB, MD;  Location: [facility] ENDOS[facility];  Service: Gastroenterology;  Laterality: N/A;   LEFT HEART CATH AND CORONARY ANGIOGRAPHY N/A 04/22/2021   Procedure: LEFT HEART CATH AND CORONARY ANGIOGRAPHY;  Surgeon: Verlin Lonni BIRCH, MD;  Location: [facility] INVASIVE CV LAB;  Service: Cardiovascular;  Laterality: N/A;   LUMBAR LAMINECTOMY     OOPHORECTOMY  02/29/1984   unilateral   PACEMAKER IMPLANT N/A 04/05/2024   Procedure: PACEMAKER IMPLANT;  Surgeon: Cindie Ole DASEN, MD;  Location: [facility] INVASIVE CV LAB;  Service: Cardiovascular;  Laterality: N/A;   POLYPECTOMY  01/17/2023   Procedure: POLYPECTOMY;  Surgeon: Leigh Elspeth SQUIBB, MD;  Location: [facility] ENDOS[facility];  Service: Gastroenterology;;   TEMPORARY PACEMAKER N/A 04/04/2024   Procedure: TEMPORARY PACEMAKER;  Surgeon: Elmira Newman PARAS, MD;  Location: [facility] INVASIVE CV  LAB;  Service: Cardiovascular;  Laterality: N/A;   Social History:  reports that she has never smoked. She has never been exposed to tobacco smoke. She has never used smokeless tobacco. She reports that she does not drink alcohol  and does not use drugs.  Family / Support Systems Marital Status: Widow/Widower Patient Roles: Parent, Other (Comment) (retiree) Children: Karen-daughter [phone]  lawernce [phone] Other Supports: grandson Anticipated Caregiver: Children and grandson Ability/Limitations of Caregiver: Daughter does work during the day but will work out with brother if Mom needs 24/7 care at [employer] Caregiver Availability: Other (Comment) (Depends upon evaluations) Family Dynamics: Close with both children and grandson, pt has been independent and taken careof herself, daughter feels needs to be montiored more closely now since this happened. Daughter to stay with at night or pt to come to daughter's home  Social History Preferred language: English Religion: Baptist Cultural Background: NA Education: HS Health Literacy - How often do you need to have someone help you when you read instructions, pamphlets, or other written material from your doctor or pharmacy?: Never Writes: Yes Employment Status: Retired Marine Scientist Issues: NA Guardian/Conservator: None-according to MD pt is capable of making her own decisions while here. Daughter would like to be included and pt is in agreement with   Abuse/Neglect Abuse/Neglect Assessment Can Be Completed: Yes Physical Abuse: Denies Verbal Abuse: Denies Sexual Abuse: Denies Exploitation of patient/patient's resources: Denies Self-Neglect: Denies  Patient response to: Social Isolation - How often do  you feel lonely or isolated from those around you?: Never  Emotional Status Pt's affect, behavior and adjustment status: Pt is motivated to do well and recover she has had a CVA in the past and had no deficits from this. Both and  she and daughter hope she does well here and makes good progress. Pt did live alone and took care of herself, did not cook or clean but was safe home alone Recent Psychosocial Issues: other health issues Psychiatric History: No history-issues seems to be doing well and coping appropriately will get input from team regarding if feels needs neuro-psych while here Substance Abuse History: No issues  Patient / Family Perceptions, Expectations & Goals Pt/Family understanding of illness & functional limitations: Pt and daughter can explain her stroke and deficits, at times she is clear in her communiication and other times she struggles. Both do talk with the MD's involved and feel understand the plan moving forward. Daughter visits daily Premorbid pt/family roles/activities: mom, grandmother, retiree, neighbor, friend, etc Anticipated changes in roles/activities/participation: resume Pt/family expectations/goals: Pt states:  I hope to go home soon.   Daughter states:  I hope she does well here and wanted her to take advantage of this opportunity while it was presented.  Community [employer] Agencies: None Premorbid Home Care/DME Agencies: Other (Comment) (rw, tub seat, ramp, cane grab bars) Transportation available at discharge: pt drove will rely on family now Is the patient able to respond to transportation needs?: Yes In the past 12 months, has lack of transportation kept you from medical appointments or from getting medications?: No In the past 12 months, has lack of transportation kept you from meetings, work, or from getting things needed for daily living?: No  Discharge Planning Living Arrangements: Alone Support Systems: Children, Other relatives, Friends/neighbors Type of Residence: Private residence Insurance Resources: Media Planner (specify) Passenger Transport Manager) Financial Resources: Restaurant Manager, Fast Food Screen Referred: No Living Expenses: Own Money Management:  Patient, Family Does the patient have any problems obtaining your medications?: No Home Management: someone cleans and cooks for pt Patient/Family Preliminary Plans: Either return to her home or going to daughter's at discharge. Children to work out plan for her, daughter is not having her go to a SNF. Will await team's evaluations and work on safest plan for pt.  Aware bewing evaluated and goals being set for stay here Care Coordinator Barriers to Discharge: Insurance for SNF coverage Care Coordinator Anticipated Follow Up Needs: HH/OP  Clinical Impression Pleasant female who is tired from two therapies this am and resting. Contacted her daughter via telephone to introduce self and answer any questions. She reports mom will either be at her home with her staying at night or going to daughter's home. Between daughter and son can work on 24/7, but hopes this will not be needed. Await therapy evaluations and work on best and safest plan for pt at discharge.  Raymonde Asberry MATSU 11/15/2024, 11:10 AM    "

## 2024-11-15 NOTE — Plan of Care (Signed)
 SUP for goals secondary to cognition    Problem: RH Balance Goal: LTG Patient will maintain dynamic standing with ADLs (OT) Description: LTG:  Patient will maintain dynamic standing balance with assist during activities of daily living (OT)  Flowsheets (Taken 11/15/2024 1253) LTG: Pt will maintain dynamic standing balance during ADLs with: Supervision/Verbal cueing   Problem: Sit to Stand Goal: LTG:  Patient will perform sit to stand in prep for activites of daily living with assistance level (OT) Description: LTG:  Patient will perform sit to stand in prep for activites of daily living with assistance level (OT) Flowsheets (Taken 11/15/2024 1253) LTG: PT will perform sit to stand in prep for activites of daily living with assistance level: Independent with assistive device   Problem: RH Grooming Goal: LTG Patient will perform grooming w/assist,cues/equip (OT) Description: LTG: Patient will perform grooming with assist, with/without cues using equipment (OT) Flowsheets (Taken 11/15/2024 1253) LTG: Pt will perform grooming with assistance level of: Independent with assistive device    Problem: RH Bathing Goal: LTG Patient will bathe all body parts with assist levels (OT) Description: LTG: Patient will bathe all body parts with assist levels (OT) Flowsheets (Taken 11/15/2024 1253) LTG: Pt will perform bathing with assistance level/cueing: Supervision/Verbal cueing   Problem: RH Dressing Goal: LTG Patient will perform upper body dressing (OT) Description: LTG Patient will perform upper body dressing with assist, with/without cues (OT). Flowsheets (Taken 11/15/2024 1253) LTG: Pt will perform upper body dressing with assistance level of: Independent with assistive device Goal: LTG Patient will perform lower body dressing w/assist (OT) Description: LTG: Patient will perform lower body dressing with assist, with/without cues in positioning using equipment (OT) Flowsheets (Taken 11/15/2024  1253) LTG: Pt will perform lower body dressing with assistance level of: Supervision/Verbal cueing   Problem: RH Toileting Goal: LTG Patient will perform toileting task (3/3 steps) with assistance level (OT) Description: LTG: Patient will perform toileting task (3/3 steps) with assistance level (OT)  Flowsheets (Taken 11/15/2024 1253) LTG: Pt will perform toileting task (3/3 steps) with assistance level: Supervision/Verbal cueing   Problem: RH Light Housekeeping Goal: LTG Patient will perform light housekeeping w/assist (OT) Description: LTG: Patient will perform light housekeeping with assistance, with/without cues (OT). Flowsheets (Taken 11/15/2024 1253) LTG: Pt will perform light housekeeping with assistance level of: Supervision/Verbal cueing   Problem: RH Toilet Transfers Goal: LTG Patient will perform toilet transfers w/assist (OT) Description: LTG: Patient will perform toilet transfers with assist, with/without cues using equipment (OT) Flowsheets (Taken 11/15/2024 1253) LTG: Pt will perform toilet transfers with assistance level of: Supervision/Verbal cueing   Problem: RH Tub/Shower Transfers Goal: LTG Patient will perform tub/shower transfers w/assist (OT) Description: LTG: Patient will perform tub/shower transfers with assist, with/without cues using equipment (OT) Flowsheets (Taken 11/15/2024 1253) LTG: Pt will perform tub/shower stall transfers with assistance level of: Supervision/Verbal cueing

## 2024-11-15 NOTE — Evaluation (Addendum)
 Speech Language Pathology Assessment and Plan  Patient Details  Name: Kayla Terry MRN: [id number] Date of Birth: 07-24-1946  SLP Diagnosis: Aphasia;Cognitive Impairments  Rehab Potential: Good ELOS: 7-10 days    Today's Date: 11/15/2024 SLP Individual Time: 9184-9084 SLP Individual Time Calculation (min): 60 min   Hospital Problem: Principal Problem:   Infarction of left basal ganglia (HCC)  Past Medical History:  Past Medical History:  Diagnosis Date   Allergy    B12 deficiency 04/2020   Cataract    Coronary artery disease    COVID-19 04/22/2021   Diverticulitis    10/19/2022   Fatty liver    GERD (gastroesophageal reflux disease)    Herpes zoster without complications    Hiatal hernia    Hypertension    Mild mitral regurgitation 2013   Mobitz type 2 second degree atrioventricular block    Pectus excavatum    pectus excavatum deformity resulting in mass effect on the RV by CT 03/2020   Pulmonary nodule    Shingles    Stroke (HCC)    10/19/2022   Past Surgical History:  Past Surgical History:  Procedure Laterality Date   ABDOMINAL HYSTERECTOMY  02/1984   BACK SURGERY     CATARACT EXTRACTION, BILATERAL     COLONOSCOPY WITH PROPOFOL  N/A 01/17/2023   Procedure: COLONOSCOPY WITH PROPOFOL ;  Surgeon: Leigh Elspeth SQUIBB, MD;  Location: [facility];  Service: Gastroenterology;  Laterality: N/A;   LEFT HEART CATH AND CORONARY ANGIOGRAPHY N/A 04/22/2021   Procedure: LEFT HEART CATH AND CORONARY ANGIOGRAPHY;  Surgeon: Verlin Lonni BIRCH, MD;  Location: [facility];  Service: Cardiovascular;  Laterality: N/A;   LUMBAR LAMINECTOMY     OOPHORECTOMY  02/29/1984   unilateral   PACEMAKER IMPLANT N/A 04/05/2024   Procedure: PACEMAKER IMPLANT;  Surgeon: Cindie Ole DASEN, MD;  Location: [facility];  Service: Cardiovascular;  Laterality: N/A;   POLYPECTOMY  01/17/2023   Procedure: POLYPECTOMY;  Surgeon: Leigh Elspeth SQUIBB, MD;  Location: [facility];   Service: Gastroenterology;;   TEMPORARY PACEMAKER N/A 04/04/2024   Procedure: TEMPORARY PACEMAKER;  Surgeon: Elmira Newman PARAS, MD;  Location: [facility];  Service: Cardiovascular;  Laterality: N/A;    Assessment / Plan / Recommendation Clinical Impression  Kayla Terry is a 79 year old right-handed female with history significant for 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 Ole Cindie, CAD/B12 deficiency, prior CVA 2023 without residual deficits, chronic pain due to spinal stenosis status post lumbar laminectomy maintained on hydrocodone , hypertension. Presented 11/09/2024 with acute onset of right sided weakness right facial droop and dysarthria. Cranial CT scan showed left basal ganglia subacute lacunar infarct, mild mass effect, no hemorrhage. MRI follow-up acute to subacute left basal ganglia infarct with no significant hemorrhagic transformation or mass effect. Tolerating a regular consistency diet. Therapy evaluations completed due to patient's decreased functional mobility was admitted for a comprehensive rehab program.   Cognitive-Linguistic Evaluation: Patient presents with mild receptive-expressive aphasia characterized primarily by reduced comprehension of complex language structures and mildly halting language at the conversation level due to contextual word finding delay. Confrontation naming and repetition are fully intact, however word finding breakdowns occur during more complex responsive and generative naming tasks. SLP administered Bedside WAB where patient scored 85/100 aphasia score indicating overall mild impairment. Given patient's overall success with language tasks, administered SLUMS examination to further evaluate cognition as it relates to language function. Patient's problem solving skills are reduced, suspect due to both reduction in cognitive skills  and language processing. Given deficits, patient would benefit from ongoing SLP services targeting  receptive/expressive language and functional problem solving with emphasis on language processing during problem solving tasks.  Patient was left in lowered bed with call bell in reach and bed alarm set. SLP will continue to target goals per plan of care.     Skilled Therapeutic Interventions          SLP conducted skilled evaluation session to assess cognitive-linguistic function. Utilized engineer, manufacturing systems, bedside Western Aphasia Battery, SLUMS, and patient and/or family interview. Full results above.   SLP Assessment  Patient will need skilled Speech Lanaguage Pathology Services during CIR admission    Recommendations  Oral Care Recommendations: Oral care BID Recommendations for Other Services: Neuropsych consult Patient destination: Home Follow up Recommendations: Outpatient SLP Equipment Recommended: None recommended by SLP    SLP Frequency 3 to 5 out of 7 days   SLP Duration  SLP Intensity  SLP Treatment/Interventions 7-10 days  Minumum of 1-2 x/day, 30 to 90 minutes  Cognitive remediation/compensation;Speech/Language facilitation;Cueing hierarchy;Functional tasks;Therapeutic Activities;Internal/external aids;Patient/family education    Pain Pain Assessment Pain Scale: 0-10 Pain Score: 10-Worst pain ever Pain Type: Chronic pain Pain Location: Leg Pain Descriptors / Indicators: Aching Pain Onset: On-going Pain Intervention(s): Medication (See eMAR)  Prior Functioning Cognitive/Linguistic Baseline: Within functional limits Type of Home: House  Lives With: Alone Available Help at Discharge: Family;Available 24 hours/day Education: high school Vocation: Retired  Architectural Technologist Overall Cognitive Status: No family/caregiver present to determine baseline cognitive functioning (comingled with mild language deficits, difficult to parse) Arousal/Alertness: Awake/alert Orientation Level: Oriented X4 Year: 2026 Month: January Day of Week:  Correct Attention: Sustained Sustained Attention: Appears intact Memory: Impaired Memory Impairment: Storage deficit;Retrieval deficit Awareness: Impaired Awareness Impairment: Anticipatory impairment;Emergent impairment Problem Solving: Impaired Problem Solving Impairment: Verbal basic;Functional basic Executive Function: Sequencing;Organizing Sequencing: Impaired Sequencing Impairment: Verbal basic;Functional basic Organizing: Impaired Organizing Impairment: Verbal basic;Functional basic Behaviors: Perseveration;Impulsive Safety/Judgment: Impaired  Comprehension Auditory Comprehension Overall Auditory Comprehension: Impaired Yes/No Questions: Impaired Basic Biographical Questions: 76-100% accurate Basic Immediate Environment Questions: 75-100% accurate Complex Questions: 50-74% accurate Commands: Impaired One Step Basic Commands: 75-100% accurate Two Step Basic Commands: 75-100% accurate Multistep Basic Commands: 50-74% accurate Conversation: Simple Interfering Components: Working memory;Processing speed EffectiveTechniques: Extra processing time;Repetition It Trainer Discrimination: Within Function Limits Reading Comprehension Reading Status: Not tested Expression Expression Primary Mode of Expression: Verbal Verbal Expression Overall Verbal Expression: Impaired Initiation: No impairment Level of Generative/Spontaneous Verbalization: Conversation Repetition: No impairment Naming: Impairment Responsive: 76-100% accurate Confrontation: Within functional limits Convergent: 75-100% accurate Divergent: 50-74% accurate Verbal Errors: Aware of errors;Semantic paraphasias Pragmatics: No impairment Interfering Components: Attention Effective Techniques: Open ended questions;Semantic cues Non-Verbal Means of Communication: Not applicable Written Expression Dominant Hand: Right Written Expression: Not tested Oral Motor Oral Motor/Sensory  Function Overall Oral Motor/Sensory Function: Mild impairment Facial ROM: Reduced right;Suspected CN VII (facial) dysfunction Facial Symmetry: Abnormal symmetry right;Suspected CN VII (facial) dysfunction Facial Strength: Within Functional Limits Facial Sensation: Within Functional Limits Lingual ROM: Within Functional Limits Lingual Symmetry: Within Functional Limits Lingual Strength: Within Functional Limits Lingual Sensation: Within Functional Limits Velum: Within Functional Limits Mandible: Within Functional Limits Motor Speech Overall Motor Speech: Impaired Respiration: Within functional limits Phonation: Normal Resonance: Within functional limits Articulation: Within functional limitis Intelligibility: Intelligible  Care Tool Care Tool Cognition Ability to hear (with hearing aid or hearing appliances if normally used Ability to hear (with hearing aid or hearing appliances if normally used): 0. Adequate - no difficulty  in normal conservation, social interaction, listening to TV   Expression of Ideas and Wants Expression of Ideas and Wants: 3. Some difficulty - exhibits some difficulty with expressing needs and ideas (e.g, some words or finishing thoughts) or speech is not clear   Understanding Verbal and Non-Verbal Content Understanding Verbal and Non-Verbal Content: 3. Usually understands - understands most conversations, but misses some part/intent of message. Requires cues at times to understand  Memory/Recall Ability Memory/Recall Ability : That he or she is in a hospital/hospital unit;Current season    Short Term Goals: Week 1: SLP Short Term Goal 1 (Week 1): STGs=LTGs d/t ELOS  Refer to Care Plan for Long Term Goals  Recommendations for other services: Neuropsych  Discharge Criteria: Patient will be discharged from SLP if patient refuses treatment 3 consecutive times without medical reason, if treatment goals not met, if there is a change in medical status, if patient  makes no progress towards goals or if patient is discharged from hospital.  The above assessment, treatment plan, treatment alternatives and goals were discussed and mutually agreed upon: by patient  Thaddeaus Monica A Ahnna Dungan 11/15/2024, 11:25 AM

## 2024-11-15 NOTE — Discharge Summary (Signed)
 Physician Discharge Summary  Patient ID: Kayla Terry MRN: [id number] DOB/AGE: [birth year] 79 y.o.  Admit date: 11/14/2024 Discharge date: 11/26/2024  Discharge Diagnoses:  Principal Problem:   Infarction of left basal ganglia (HCC) DVT prophylaxis Chronic pain management Insomnia Type II Mobitz/PPM Hypertension Hyperlipidemia GERD  Discharged Condition: Stable  Significant Diagnostic Studies: ECHOCARDIOGRAM COMPLETE Result Date: 11/11/2024    ECHOCARDIOGRAM REPORT   Patient Name:   Kayla Terry Date of Exam: 11/11/2024 Medical Rec #:  [id number]      Height:       63.0 in Accession #:    [id number]     Weight:       153.0 lb Date of Birth:  November 10, 1945      BSA:          1.726 m Patient Age:    78 years       BP:           148/87 mmHg Patient Gender: F              HR:           68 bpm. Exam Location:  Inpatient Procedure: 2D Echo, Cardiac Doppler and Color Doppler (Both Spectral and Color            Flow Doppler were utilized during procedure). Indications:    Stroke I63.9  History:        Patient has prior history of Echocardiogram examinations, most                 recent 04/05/2024. Risk Factors:Hypertension.  Sonographer:    Nathanel Devonshire Referring Phys: [phone] DENISE A WOLFE IMPRESSIONS  1. Left ventricular ejection fraction, by estimation, is 60 to 65%. The left ventricle has normal function. The left ventricle has no regional wall motion abnormalities. Left ventricular diastolic parameters were normal.  2. Right ventricular systolic function is normal. The right ventricular size is normal.  3. The mitral valve is grossly normal. Trivial mitral valve regurgitation. No evidence of mitral stenosis.  4. The aortic valve was not well visualized. Aortic valve regurgitation is not visualized. No aortic stenosis is present. Comparison(s): No significant change from prior study. Conclusion(s)/Recommendation(s): Normal biventricular function without evidence of hemodynamically significant valvular heart  disease. No intracardiac source of embolism detected on this transthoracic study. Consider a transesophageal echocardiogram to exclude cardiac source of embolism if clinically indicated. FINDINGS  Left Ventricle: Left ventricular ejection fraction, by estimation, is 60 to 65%. The left ventricle has normal function. The left ventricle has no regional wall motion abnormalities. The left ventricular internal cavity size was normal in size. There is  no left ventricular hypertrophy. Left ventricular diastolic parameters were normal. Right Ventricle: The right ventricular size is normal. Right vetricular wall thickness was not well visualized. Right ventricular systolic function is normal. Left Atrium: Left atrial size was normal in size. Right Atrium: Right atrial size was normal in size. Pericardium: There is no evidence of pericardial effusion. Mitral Valve: The mitral valve is grossly normal. Trivial mitral valve regurgitation. No evidence of mitral valve stenosis. Tricuspid Valve: The tricuspid valve is not well visualized. Tricuspid valve regurgitation is not demonstrated. No evidence of tricuspid stenosis. Aortic Valve: The aortic valve was not well visualized. Aortic valve regurgitation is not visualized. No aortic stenosis is present. Aortic valve mean gradient measures 2.0 mmHg. Aortic valve peak gradient measures 3.9 mmHg. Aortic valve area, by VTI measures 2.87 cm. Pulmonic Valve: The pulmonic valve was not well visualized. Pulmonic valve  regurgitation is not visualized. No evidence of pulmonic stenosis. Aorta: The aortic root and ascending aorta are structurally normal, with no evidence of dilitation. Venous: The inferior vena cava was not well visualized. IAS/Shunts: The atrial septum is grossly normal.  LEFT VENTRICLE PLAX 2D LVIDd:         3.40 cm     Diastology LVIDs:         2.20 cm     LV e' medial:    8.05 cm/s LV PW:         0.80 cm     LV E/e' medial:  7.6 LV IVS:        0.80 cm     LV e' lateral:    6.09 cm/s LVOT diam:     1.90 cm     LV E/e' lateral: 10.0 LV SV:         52 LV SV Index:   30 LVOT Area:     2.84 cm LV IVRT:       77 msec  LV Volumes (MOD) LV vol d, MOD A2C: 26.7 ml LV vol d, MOD A4C: 58.7 ml LV vol s, MOD A2C: 10.0 ml LV vol s, MOD A4C: 18.7 ml LV SV MOD A2C:     16.7 ml LV SV MOD A4C:     58.7 ml LV SV MOD BP:      28.3 ml RIGHT VENTRICLE RV Basal diam:  2.10 cm TAPSE (M-mode): 1.6 cm LEFT ATRIUM           Index       RIGHT ATRIUM          Index LA diam:      2.60 cm 1.51 cm/m  RA Area:     5.29 cm LA Vol (A2C): 15.5 ml 8.98 ml/m  RA Volume:   8.37 ml  4.85 ml/m LA Vol (A4C): 16.9 ml 9.79 ml/m  AORTIC VALVE                    PULMONIC VALVE AV Area (Vmax):    2.74 cm     PV Vmax:       0.71 m/s AV Area (Vmean):   2.55 cm     PV Peak grad:  2.0 mmHg AV Area (VTI):     2.87 cm AV Vmax:           99.30 cm/s AV Vmean:          69.500 cm/s AV VTI:            0.183 m AV Peak Grad:      3.9 mmHg AV Mean Grad:      2.0 mmHg LVOT Vmax:         95.80 cm/s LVOT Vmean:        62.400 cm/s LVOT VTI:          0.185 m LVOT/AV VTI ratio: 1.01  AORTA Ao Root diam: 2.80 cm Ao Asc diam:  3.10 cm MITRAL VALVE MV Area (PHT): 3.53 cm    SHUNTS MV Decel Time: 215 msec    Systemic VTI:  0.18 m MV E velocity: 60.80 cm/s  Systemic Diam: 1.90 cm MV A velocity: 81.80 cm/s MV E/A ratio:  0.74 Shelda Bruckner MD Electronically signed by Shelda Bruckner MD Signature Date/Time: 11/11/2024/5:36:33 PM    Final    MR BRAIN WO CONTRAST Result Date: 11/11/2024 EXAM: MRI BRAIN WITHOUT CONTRAST 11/11/2024 10:10:00 AM TECHNIQUE: Multiplanar multisequence MRI of  the head/brain was performed without the administration of intravenous contrast. COMPARISON: CT head and CTA head and neck 11/09/2024. Brain MRI 10/19/2022. CLINICAL HISTORY: 79 year old female. Code strip presentation 2 days ago with evidence of left basal ganglia infarct. FINDINGS: BRAIN AND VENTRICLES: Confluent abnormal diffusion throughout the  left basal ganglia which is mostly restricted. No convincing hemorrhage associated (dilated perivascular spaces). Cytotoxic edema with minor regional mass effect. No midline shift. No other diffusion abnormality. Scattered chronic cerebral white matter T2 and FLAIR hyperintensity elsewhere, including in the right corona radiata and not significantly changed there from 2023 MRI. No cortical encephalomalacia or chronic cerebral blood products identified. Chronic lacunar infarct left thalamus was acute in 2023. Negative brainstem and cerebellum. No mass. No hydrocephalus. The sella is unremarkable. Normal flow voids. ORBITS: No significant abnormality. SINUSES AND MASTOIDS: No significant abnormality. BONES AND SOFT TISSUES: Normal marrow signal. No significant soft tissue abnormality. Negative for age visible cervical spine. IMPRESSION: 1. Acute to subacute Left basal ganglia infarct with no significant hemorrhagic transformation or mass effect. 2. Chronic small vessel disease, otherwise not significantly progressed since 2023. Electronically signed by: Helayne Hurst MD MD 11/11/2024 10:42 AM EST RP Workstation: [id number]   CT ANGIO HEAD NECK W WO CM W PERF (CODE STROKE) Result Date: 11/09/2024 EXAM: CTA Head and Neck with Perfusion 11/09/2024 10:48:56 AM TECHNIQUE: CTA of the head and neck was performed without and with the administration of intravenous contrast. iohexol  (OMNIPAQUE ) 350 MG/ML injection was administered. 3D postprocessing with multiplanar reconstructions and MIPs was performed to evaluate the vascular anatomy. Cerebral perfusion analysis using computed tomography with contrast administration, including post-processing of parametric maps with determination of cerebral blood flow, cerebral blood volume, mean transit time and time-to-maximum. Automated exposure control, iterative reconstruction, and/or weight based adjustment of the mA/kV was utilized to reduce the radiation dose to as low as  reasonably achievable. COMPARISON: Plain head CT 11/09/2024 reported separately. CTA head and neck 10/19/2022. CLINICAL HISTORY: 79 year old female with acute neuro deficit, stroke suspected. FINDINGS: CTA NECK: AORTIC ARCH AND ARCH VESSELS: 3-vessel arch is mostly visible. No significant arch atherosclerosis visible. Generalized mild tortuosity of proximal great vessels. Minimal to mild proximal great vessel atherosclerosis. Incomplete visualization of the brachiocephalic artery with normal right CCA origin. No dissection or arterial injury. No significant stenosis of the brachiocephalic or subclavian arteries. CERVICAL CAROTID ARTERIES: Tortuous right CCA. Minimal calcified atherosclerosis at the right ICA origin. No right carotid stenosis. Mildly tortuous left CCA. Mild calcified atherosclerosis at the left ICA origin and bulb without stenosis. No left carotid stenosis. No dissection or arterial injury. No hemodynamically significant stenosis by NASCET criteria. CERVICAL VERTEBRAL ARTERIES: Both vertebral artery origins are normal aside from some tortuosity. Tortuous vertebral V2 segments. Codominant vertebral arteries with a tapered appearance as they cross the dura into the posterior fossa (series 9 image 152) but no significant stenosis. No dissection or arterial injury. No significant stenosis. CTA HEAD: ANTERIOR CIRCULATION: No significant stenosis of the internal carotid arteries. Normal right posterior communicating artery, the left is diminutive or absent. Diminutive or absent anterior communicating artery, normal variant. Normal MCA and ACA origins. Mild to moderate stenosis of the left ACA A2 segment on series 19 image 17 is new. Very distal left ACA (A4 or A5) irregularity and stenosis is stable since 2023 (same image). Mild bilateral MCA M1 segment irregularity and stenosis on series 15 image 16. No MCA branch occlusion. There is moderate stenosis of the distal aspect of the dominant  left MCA  posterior M2 division on series 19 image 25 which is new since 2023. No aneurysm. POSTERIOR CIRCULATION: Normal PICA origins. Normal SCA and left PCA origin. Fetal type right PCA origin. Mild mid basilar irregularity. No significant stenosis. Mild bilateral PCA irregularity, no significant stenosis. No significant stenosis of the vertebral arteries. No aneurysm. OTHER: Left chest cardiac pacemaker in place and partially visible with mild streak artifact. Otherwise negative visible upper chest. At the larynx the glottis is closed. Nonvascular neck soft tissue spaces are negative. Chronic severe cervical spinal facet arthropathy, with progressive degenerative facet ankylosis since prior. Chronic severe lower cervical disc and endplate degeneration. No acute osseous abnormality. INTRACRANIAL VENOUS: Incidental venous contrast reflux from left upper extremity injection throughout the left internal jugular vein and into the intracranial left transverse sinus. Major dural venous sinuses are enhancing and patent. CT PERFUSION: EXAM QUALITY: Exam quality is adequate with diagnostic perfusion maps. No significant motion artifact. Appropriate arterial inflow and venous outflow curves. CORE INFARCT (CBF<30% volume): 0 mL TOTAL HYPOPERFUSION (Tmax>6s volume): 0 mL PENUMBRA: Mismatch volume: 0 mL Mismatch ratio: not applicable Location: not applicable IMPRESSION: 1. Negative CT perfusion. 2. Negative for large vessel occlusion. Intracranial atherosclerosis with some progression since 2023 (up to moderate left ACA A2, left MCA distal posterior M2 stenoses. Chronic very distal left ACA stenosis. 3. No extracranial stenosis. Electronically signed by: Helayne Hurst MD MD 11/09/2024 11:01 AM EST RP Workstation: [id number]   CT HEAD CODE STROKE WO CONTRAST Result Date: 11/09/2024 EXAM: CT HEAD WITHOUT CONTRAST 11/09/2024 10:35:24 AM TECHNIQUE: CT of the head was performed without the administration of intravenous contrast. Automated  exposure control, iterative reconstruction, and/or weight based adjustment of the mA/kV was utilized to reduce the radiation dose to as low as reasonably achievable. COMPARISON: Brain MRI 10/19/2022, Head CT 04/10/2023. CLINICAL HISTORY: 79 year old female. Neurological deficit, acute, stroke suspected. FINDINGS: BRAIN AND VENTRICLES: No acute hemorrhage. Indistinct and confluent hypodensity in the left basal ganglia is new (series 3 image 16), and is most compatible with subacute lacunar infarct. There is subtle mass effect on the adjacent right lateral ventricle (series 9 image 30). No hemorrhagic transformation. No other left MCA territory cytotoxic edema. Superimposed chronic left thalamic lacunar infarct, present in 2024. Patchy additional deep white matter capsule hypodensity is stable. Faint calcified atherosclerosis at the skull base. No suspicious intracranial vascular hyperdensity. No hydrocephalus. No extra-axial collection. No midline shift. ORBITS: Paranasal sinuses, tympanic cavities and mastoids are clear. No gaze deviation. SINUSES: Paranasal sinuses are clear. SOFT TISSUES AND SKULL: No acute soft tissue abnormality. No skull fracture. ALBERTA STROKE PROGRAM EARLY CT SCORE (ASPECTS): Ganglionic (caudate, IC, lentiform nucleus, insula, M1-M3): 5. Supraganglionic (M4-M6): 3. Total: 8. IMPRESSION: 1. Left basal ganglia subacute lacunar infarct, mild mass effect, no hemorrhage. ASPECTS 8. 2. Underlying chronic small vessel disease, including in the left thalamus. 3. These results were communicated to Dr. Nichola at 10:45am on 11/09/2024 by text page via the [facility] messaging system. Electronically signed by: Helayne Hurst MD MD 11/09/2024 10:46 AM EST RP Workstation: [id number]    Labs:  Basic Metabolic Panel: Recent Labs  Lab 11/15/24 0718  NA 133*  K 4.3  CL 99  CO2 26  GLUCOSE 139*  BUN 20  CREATININE 0.94  CALCIUM  9.1    CBC: Recent Labs  Lab 11/15/24 0718  WBC 7.1  NEUTROABS 5.0   HGB 13.7  HCT 40.3  MCV 87.4  PLT 241    CBG: No results for input(s): GLUCAP  in the last 168 hours.   Brief HPI:   Kayla Terry is a 79 y.o. right-handed female with history significant for 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  Per chart review patient lives alone.  1 level home with ramped entrance.  Reportedly independent prior to admission driving with rare use of a cane.  She does have good family support.  Presented 11/09/2024 with acute onset of right sided weakness facial droop and dysarthria.  Cranial CT scan showed left basal ganglia subacute lacunar infarct, mild mass effect no hemorrhage.  Underlying chronic small vessel disease including the left thalamus.  CTA showed no large vessel occlusion with intracranial atherosclerosis progressed since 2023.  Patient did not receive TNK.  MRI follow-up acute to subacute left basal ganglia infarct with no significant hemorrhagic transformation or mass effect.  Echocardiogram with ejection fraction of 60 to 65% no wall motion abnormalities.  Interrogation of pacemaker showed no A-fib.  Neurology follow-up placed on aspirin  81 mg daily and Plavix  75 mg daily x 3 weeks then aspirin  alone.  Patient was cleared to begin Lovenox  for DVT prophylaxis.  Tolerating a regular consistency diet.  Therapy evaluations completed due to patient's decreased functional mobility was admitted for a comprehensive rehab program.   Hospital Course: Kayla Terry was admitted to rehab 11/14/2024 for inpatient therapies to consist of PT, ST and OT at least three hours five days a week. Past admission physiatrist, therapy team and rehab RN have worked together to provide customized collaborative inpatient rehab.  Pertaining to patient's left basal ganglia infarction as well as  history of CVA 2023 without residual deficits.  She remained on aspirin  and Plavix  x 3 weeks then aspirin  alone.  Follow-up per neurology services.  Lovenox  used for DVT prophylaxis.  Chronic pain management with back pain severe spinal stenosis L4-5 severe right and moderate left neuroforaminal stenosis L5-S1.  Neurontin  300 mg 3 times daily with Cymbalta  20 mg twice daily she was using hydrocodone  for breakthrough pain.  Her home regimen was Norco 5 mg twice daily.  Mood stabilization using Ambien /trazodone  as needed for sleep.  History of type II Mobitz status post PPM 04/05/2024 followed by cardiology services Ole Holts interrogation of pacemaker showed no A-fib.  Blood pressure remained controlled on 5 mg of Norvasc  daily monitored with increased mobility.  Crestor  ongoing for hyperlipidemia.  History of GERD continued Pepcid .   Blood pressures were monitored on TID basis and remained controlled monitored     Rehab course: During patient's stay in rehab weekly team conferences were held to monitor patient's progress, set goals and discuss barriers to discharge. At admission, patient required minimal assist 70 feet rolling walker minimal assist sit to stand  He/She  has had improvement in activity tolerance, balance, postural control as well as ability to compensate for deficits. He/She has had improvement in functional use RUE/LUE  and RLE/LLE as well as improvement in awareness.  Working with energy conservation techniques.  Patient navigated floor ladder with lateral steps and cues to perform cone taps with bilateral lower extremities.  Overall required minimal assist for standing balance and multi modal cues for weight shift.  Bed mobility performed supine to sit edge of bed supervision.  Perform sit to stand transfers throughout session without assistive device contact-guard for safety.  Ambulatory transfer to the toilet in bathroom with  no assistive device using contact-guard with hand-held  assist due to inattention.  Patient continent of bowel and bladder performs PeriCare with supervision.  Manages clothing with close supervision.  SLP follow-up targeting cognitive/communication goals.  Patient recalls events from prior dates therapy sessions with min to light mod assist.  She is oriented to date with supervision with use of external memory aids.  SLP facilitated language comprehension based problem solving task where patient identified information present on various torque coupons with supervision assist.  Completed immediate memory task where she answered mildly complex questions visually presented image after image removal with 80% accuracy.  Full family teaching completed plan discharge to home.       Disposition:  There are no questions and answers to display.         Diet: Regular  Special Instructions: No driving smoking or alcohol   Medications at discharge. 1.  Tylenol  as needed 2.  Norvasc  5 mg p.o. daily 3.  Artificial tears 1 drop both eyes as needed 4.  Aspirin  81 mg p.o. daily 5.  Plavix  75 mg p.o. daily until 11/30/2024 and stop 6.  Cymbalta  20 mg p.o. daily 7.  Pepcid  20 mg p.o. daily 8.  Neurontin  300 mg p.o. 3 times daily 9.  Hydrocodone  5-325 mg 1 tablet twice daily as needed moderate pain 10.  Crestor  40 mg p.o. daily 11.  Ambien  5 mg p.o. nightly 12.  Senokot S1 tablet twice daily 13.  MiraLAX  daily 14.  Trazodone  100 mg nightly 15.  Systane balance 0.6% 1 drop both eyes daily as needed  30-35 minutes were spent completing discharge summary and discharge planning  Discharge Instructions     Ambulatory referral to Neurology   Complete by: As directed    An appointment is requested in approximately: 4 weeks left basal ganglia infarction   Ambulatory referral to Physical Medicine Rehab   Complete by: As directed    Moderate complexity follow-up 1 to 2 weeks left basal ganglia infarction        Follow-up Information     Kirsteins,  Prentice BRAVO, MD Follow up.   Specialty: Physical Medicine and Rehabilitation Why: Office to call for appointment Contact information: [address] [place] [address] [phone]         Lazoff, Elouise P, DO Follow up.   Specialty: Family Medicine Why: Call for appointment Contact information: [address] [place] [address] [phone]         Cindie Ole DASEN, MD Follow up.   Specialties: Cardiology, Radiology Why: Call for appointment                Signed: Toribio PARAS Masao Junker 11/22/2024, 4:30 AM

## 2024-11-15 NOTE — Progress Notes (Signed)
 Inpatient Rehabilitation Center Individual Statement of Services  Patient Name:  Kayla Terry  Date:  11/15/2024  Welcome to the Inpatient Rehabilitation Center.  Our goal is to provide you with an individualized program based on your diagnosis and situation, designed to meet your specific needs.  With this comprehensive rehabilitation program, you will be expected to participate in at least 3 hours of rehabilitation therapies Monday-Friday, with modified therapy programming on the weekends.  Your rehabilitation program will include the following services:  Physical Therapy (PT), Occupational Therapy (OT), Speech Therapy (ST), 24 hour per day rehabilitation nursing, Therapeutic Recreaction (TR), Care Coordinator, Rehabilitation Medicine, Nutrition Services, and Pharmacy Services  Weekly team conferences will be held on Wednesday to discuss your progress.  Your Inpatient Rehabilitation Care Coordinator will talk with you frequently to get your input and to update you on team discussions.  Team conferences with you and your family in attendance may also be held.  Expected length of stay: 7-10 days  Overall anticipated outcome: supervision-mod/I level  Depending on your progress and recovery, your program may change. Your Inpatient Rehabilitation Care Coordinator will coordinate services and will keep you informed of any changes. Your Inpatient Rehabilitation Care Coordinator's name and contact numbers are listed  below.  The following services may also be recommended but are not provided by the Inpatient Rehabilitation Center:  Driving Evaluations Home Health Rehabiltiation Services Outpatient Rehabilitation Services    Arrangements will be made to provide these services after discharge if needed.  Arrangements include referral to agencies that provide these services.  Your insurance has been verified to be:  [employer] Your primary doctor is:  Shawn Lazoff  Pertinent information will be  shared with your doctor and your insurance company.  Inpatient Rehabilitation Care Coordinator:  Rhoda Clement, KEN [phone] or ELIGAH BASQUES  Information discussed with and copy given to patient by: Clement Asberry MATSU, 11/15/2024, 11:14 AM

## 2024-11-16 DIAGNOSIS — I639 Cerebral infarction, unspecified: Secondary | ICD-10-CM | POA: Diagnosis not present

## 2024-11-16 MED ORDER — TRAZODONE HCL 50 MG PO TABS
100.0000 mg | ORAL_TABLET | Freq: Every day | ORAL | Status: DC
  Administered 2024-11-16 – 2024-11-25 (×10): 100 mg via ORAL
  Filled 2024-11-16 (×10): qty 2

## 2024-11-16 NOTE — Progress Notes (Signed)
 Speech Language Pathology Daily Session Note  Patient Details  Name: Kayla Terry MRN: [id number] Date of Birth: [birth year]  Today's Date: 11/16/2024 SLP Individual Time: 1300-1400 SLP Individual Time Calculation (min): 60 min  Short Term Goals: Week 1: SLP Short Term Goal 1 (Week 1): STGs=LTGs d/t ELOS  Skilled Therapeutic Interventions: Skilled intervention focused on word finding and cognition. She required increased time and additional cues to increase naming when given a verbal description. Generative naming task completed with mod cues and additional time for processing verbal information. She inresaed accuracy with orientation problem solving questions with a visual aid monthly calendar.She followed simple 2-3 step verbal commands with max a and repetition of information. Cont therapy per plan of care.      Pain Pain Assessment Pain Scale: Faces Pain Score: 4  Faces Pain Scale: No hurt Pain Location: Leg Pain Intervention(s): Medication (See eMAR)  Therapy/Group: Individual Therapy  Kayla Terry LABOR Kayla Terry 11/16/2024, 1:56 PM

## 2024-11-16 NOTE — Progress Notes (Signed)
 Occupational Therapy Session Note  Patient Details  Name: Kayla Terry MRN: [id number] Date of Birth: 06/07/1946  Today's Date: 11/16/2024 OT Individual Time: 9754-9669 OT Individual Time Calculation (min): 45 min    Short Term Goals: Week 1:  OT Short Term Goal 1 (Week 1): STG=LTG d/t ELOS  Skilled Therapeutic Interventions/Progress Updates:     Initial Encounter: The pt was resting in bed at the time of arrival with report of foot pain of a 10 on a 0-10 secondary to neuropathy. The patient indicated that she was met with challenges in relation to resting during the night. The pt was in agreement with completing core strengthening and pain management to address her foot pain.  Nursing was made aware of the patient's concerns.   BADL Related Task: The pt was able to transfer from supine in bed to EOB with  closeS. The patient was able to transfer from EOB to w/c LOF using the RW with CGA. The pt was transported to the restroom and was able to come from w/c LOF to standing using the grab bars at closeS. The pt was able to doff her LB clothing, her underwear and pants with closeS. The pt was able to go into sit for placement on the commode using the grab bar with closeS. The pt was able to complete toileting hygiene with closeS. The pt was able to come from sit to stand using the grab bar for donning her LB garment, underwear and pants with closeS. The pt was able to ambulate from the restroom to the sink using the RW for washing her hands with s/uA. The pt was able to go from standing to sitting at w/c LOF for completing UB exercise.   UB/LB Exercise: The pt went on to complete the LB cycle for 8 minutes in duration with 2 rest breaks. The pt completed UB exercises using the 3lb dumb bell 2 sets of 10 for bicep curls and elbow extension. At the end of the session, the patient was transported to EOB and was able to go from sit to stand using the arm of the w/c and the RW for coming into stand with  CGA. The pt was able to go from standing to sitting EOB with close S. The pt was able to transition from EOB to supine in bed with closeS. The call light and bedside table were put in place with all additional needs addressed prior to exiting the room. Therapy Documentation Precautions:  Precautions Precautions: Fall Recall of Precautions/Restrictions: Impaired Precaution/Restrictions Comments: R inattention Restrictions Weight Bearing Restrictions Per Provider Order: No   Therapy/Group: Individual Therapy  Kayla Terry 11/16/2024, 4:07 PM

## 2024-11-16 NOTE — Progress Notes (Signed)
 Occupational Therapy Session Note  Patient Details  Name: Kayla Terry MRN: [id number] Date of Birth: [birth year]  Today's Date: 11/16/2024 OT Individual Time: 9194-9084 OT Individual Time Calculation (min): 70 min    Short Term Goals: Week 1:  OT Short Term Goal 1 (Week 1): STG=LTG d/t ELOS  Skilled Therapeutic Interventions/Progress Updates:  Skilled OT intervention completed with focus on ADL retraining, functional endurance, and mobility within a shower context. Pt received upright in bed, agreeable to session. Intermittent expressive aphasia. Pain reported in bilateral feet from all the therapy yesterday; declined intervention. OT offered rest breaks, repositioning and moist heat via shower for pain relief.  Pt completed all sit > stands close supervision/CGA without AD and CGA ambulatory transfers with R HHA during session. Pt required cues for attending to R side due to inattention as well as for initiation, sequencing and problem solving.   Waterproof cover applied to LUE IV prior to shower. Pt initiated wanting to wash hair, but once in shower changed her mind, and also started spraying body with water overall parts but then declined washing body. Anticipate fatigue and cold air contributed but some initiation and sequencing deficits noted. Also of note- pt didn't realize sprayer was directed at mouth for couple seconds and did having coughing episode but was able to clear airway appropriately. Transferred > toilet. Continent of urinary void. Able to donn shirt/deo with supervision, with cues for fully donning RUE. Threaded LB clothing with CGA at the sit > stand level. Donned socks/shoes with supervision using figure four position. Supervision for oral care and hair grooming which cues were needed for locating items on R side of sink and initiating each task.   Transported > gym. Seated in w/c, pt completed the following BUE exercises to promote BUE strength/endurance needed for  independence with functional transfers and BADLs: (With 3 lb dowel) -15 chest press -15 bicep curls Pt noted to have difficulty counting reps.  Pt remained seated in w/c, with chair pad alarm on/activated, and with all needs in reach at end of session.   Therapy Documentation Precautions:  Precautions Precautions: Fall Recall of Precautions/Restrictions: Impaired Precaution/Restrictions Comments: R inattention Restrictions Weight Bearing Restrictions Per Provider Order: No    Therapy/Group: Individual Therapy  Lorrayne FORBES Fritter, MS, OTR/L  11/16/2024, 9:32 AM

## 2024-11-16 NOTE — Progress Notes (Signed)
 Physical Therapy Session Note  Patient Details  Name: Kayla Terry MRN: [id number] Date of Birth: Jul 30, 1946  Today's Date: 11/16/2024 PT Individual Time: 1022-1103 PT Individual Time Calculation (min): 41 min   Short Term Goals: Week 1:  PT Short Term Goal 1 (Week 1): STG = LTG 2/2 ELOS  Skilled Therapeutic Interventions/Progress Updates: Patient supine in bed on entrance to room. Patient alert and agreeable to PT session.   Patient reported unrated pain in B LE's (manual therapy provided after PTA provided light stretch of plantarflexors with pt reporting increase in pain). Pt educated that since pt has not been physically active since admission to hospital until inpatient rehab that B LE's are likely overworked and has increased tension due to ambulating  Therapeutic Activity: Bed Mobility: Pt performed supine<>sit on EOB with supervision. Transfers: Pt performed sit<>stand transfers throughout session with CGA no AD. Pt sat EOB and donned personal shoes with supervision.  TUG (avg = 30.46s) without AD with overall CGA  - 33.8s  - 32.31s  - 25.26s  Pt ambulated short distance in room with light minA to void bladder and provided with closed door supervision for privacy with pt performing self care (anterior) without assistance. Pt then ambulated with light minA to wash hands at sink.   Manual Therapy: Palpation of B plantar flexors performed with tension noted. Education and rationale provided with pt agreeing to participate in intervention. - Soft tissue mobilization provided to B plantarflexors (knee in extension to bias gastrocnemius). PTA also provided contract-relax to further relax plantarflexors. Manual therapy also provided to plantar fascia. Pt reported decrease in B LE pain following   Patient supine in bed at end of session with brakes locked, bed alarm set, and all needs within reach.      Therapy Documentation Precautions:  Precautions Precautions: Fall Recall  of Precautions/Restrictions: Impaired Precaution/Restrictions Comments: R inattention Restrictions Weight Bearing Restrictions Per Provider Order: No  Therapy/Group: Individual Therapy  Tariah Transue PTA 11/16/2024, 12:55 PM

## 2024-11-16 NOTE — Progress Notes (Signed)
 "                                                        PROGRESS NOTE   Subjective/Complaints:  No events overnight.  Vitals are stable. Patient continues to perseverate on timing of pain medication, states nursing is only giving her gabapentin  twice daily, but when inquired with nursing she actually meant her Norco.  She complains of increased sensitivity and pain in the right foot.  She states this was present prior to her stroke, but has worsened since.  Got an epidural steroid injection shortly before admission, which was not helpful.  She confirms that she takes trazodone  100 mg nightly at home, and does not want this resume.   ROS: Positives per HPI above. Denies fevers, chills, N/V, abdominal pain, SOB, chest pain, new weakness or paraesthesias.   +insomnia + pain - RLE> LLE; with hypersensitivity  Objective:   No results found. Recent Labs    11/14/24 1517 11/15/24 0718  WBC 8.1 7.1  HGB 13.8 13.7  HCT 40.9 40.3  PLT 261 241   Recent Labs    11/14/24 1517 11/15/24 0718  NA  --  133*  K  --  4.3  CL  --  99  CO2  --  26  GLUCOSE  --  139*  BUN  --  20  CREATININE 1.21* 0.94  CALCIUM   --  9.1    Intake/Output Summary (Last 24 hours) at 11/16/2024 1631 Last data filed at 11/16/2024 1322 Gross per 24 hour  Intake 240 ml  Output --  Net 240 ml        Physical Exam: Vital Signs Blood pressure (!) 141/80, pulse 62, temperature 97.9 F (36.6 C), temperature source Oral, resp. rate 15, height 5' 3 (1.6 m), weight 65.7 kg, SpO2 95%.   General: No acute distress.  Lying in bed. Mood and affect are appropriate--perseverates on medication timing. Heart: Regular rate and rhythm no rubs murmurs or extra sounds Lungs: Clear to auscultation, breathing unlabored, no rales or wheezes Abdomen: Positive bowel sounds, soft nontender to palpation, nondistended Extremities: No clubbing, cyanosis, or edema Skin: No evidence of breakdown, no evidence of  rash  Neurologic: Awake, alert, oriented to self and place, partially to time. Follows all commands appropriately  Cranial nerves II through XII intact, motor strength is 5/5 in Left and 4/5 R deltoid, bicep, tricep, grip, hip flexor, knee extensors, ankle dorsiflexor and plantar flexor Sensory exam: Hypersensitive to light touch throughout the right ankle and calf; otherwise sensation intact  Musculoskeletal: Full range of motion in all 4 extremities. No joint swelling   Assessment/Plan: 1. Functional deficits which require 3+ hours per day of interdisciplinary therapy in a comprehensive inpatient rehab setting. Physiatrist is providing close team supervision and 24 hour management of active medical problems listed below. Physiatrist and rehab team continue to assess barriers to discharge/monitor patient progress toward functional and medical goals  Care Tool:  Bathing    Body parts bathed by patient: Right arm, Left upper leg, Left arm, Right lower leg, Chest, Abdomen, Left lower leg, Front perineal area, Face, Buttocks, Right upper leg   Body parts bathed by helper: Buttocks     Bathing assist Assist Level: Contact Guard/Touching assist     Upper Body Dressing/Undressing Upper body dressing  What is the patient wearing?: Pull over shirt    Upper body assist Assist Level: Supervision/Verbal cueing    Lower Body Dressing/Undressing Lower body dressing      What is the patient wearing?: Underwear/pull up, Pants     Lower body assist Assist for lower body dressing: Contact Guard/Touching assist     Toileting Toileting    Toileting assist Assist for toileting: Contact Guard/Touching assist     Transfers Chair/bed transfer  Transfers assist     Chair/bed transfer assist level: Minimal Assistance - Patient > 75%     Locomotion Ambulation   Ambulation assist      Assist level: Minimal Assistance - Patient > 75% Assistive device: Walker-rolling Max  distance: 200   Walk 10 feet activity   Assist     Assist level: Minimal Assistance - Patient > 75% Assistive device: Walker-rolling   Walk 50 feet activity   Assist    Assist level: Minimal Assistance - Patient > 75% Assistive device: Walker-rolling    Walk 150 feet activity   Assist    Assist level: Minimal Assistance - Patient > 75% Assistive device: Walker-rolling    Walk 10 feet on uneven surface  activity   Assist     Assist level: Minimal Assistance - Patient > 75% Assistive device: Walker-rolling   Wheelchair     Assist Is the patient using a wheelchair?: Yes Type of Wheelchair: Manual    Wheelchair assist level: Moderate Assistance - Patient 50 - 74% Max wheelchair distance: 100    Wheelchair 50 feet with 2 turns activity    Assist        Assist Level: Moderate Assistance - Patient 50 - 74%   Wheelchair 150 feet activity     Assist      Assist Level: Maximal Assistance - Patient 25 - 49%   Blood pressure (!) 141/80, pulse 62, temperature 97.9 F (36.6 C), temperature source Oral, resp. rate 15, height 5' 3 (1.6 m), weight 65.7 kg, SpO2 95%.  Medical Problem List and Plan: 1. Functional deficits secondary to left basal ganglia infarction as well as history of CVA 2023 without residual deficits             -patient may   shower             -ELOS/Goals: 7-10 days, Mod I PT/OT/SLP              - stable for IRF   2.  Antithrombotics: -DVT/anticoagulation:  Pharmaceutical: Lovenox              -antiplatelet therapy: Aspirin  81 mg daily and Plavix  75 mg daily x 3 weeks then aspirin  alone   3. 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 Mood/Behavior/Sleep: Ambien  5 mg nightly.  Provide emotional support             -antipsychotic agents: N/A              -  was on ambien  10 mg at bedtime + ?trazodone  100 mg at bedtime at home; add trazodone  50 mg PRN 1-17: Scheduled trazodone  100 mg nightly per patient request              5. Neuropsych/cognition: This patient is capable of making decisions on her own behalf. 6. Skin/Wound Care: Routine skin checks              - R antecubital IV site open w/ erythema - add neosporin 2x daily; monitor closely--appears improved 1-17   7. Fluids/Electrolytes/Nutrition: Routine in and outs with follow-up chemistries 8.  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  Follow-up Dr. Ole Holts.  Interrogation of pacemaker showed no A-fib 9.  Hypertension.  Norvasc  5 mg daily.  Monitor with increased mobility Vitals:   11/15/24 1934 11/16/24 0356  BP: 131/68 (!) 141/80  Pulse: 69 62  Resp: 16 15  Temp: 99.2 F (37.3 C) 97.9 F (36.6 C)  SpO2: 95% 95%    10.  Hyperlipidemia.  Crestor  11.  GERD.  Pepcid        LOS: 2 days A FACE TO FACE EVALUATION WAS PERFORMED  Joesph JAYSON Likes 11/16/2024, 4:31 PM     "

## 2024-11-17 DIAGNOSIS — I639 Cerebral infarction, unspecified: Secondary | ICD-10-CM | POA: Diagnosis not present

## 2024-11-17 MED ORDER — POLYETHYLENE GLYCOL 3350 17 G PO PACK
17.0000 g | PACK | Freq: Every day | ORAL | Status: DC
  Administered 2024-11-17 – 2024-11-25 (×9): 17 g via ORAL
  Filled 2024-11-17 (×10): qty 1

## 2024-11-17 MED ORDER — SENNOSIDES-DOCUSATE SODIUM 8.6-50 MG PO TABS
1.0000 | ORAL_TABLET | Freq: Two times a day (BID) | ORAL | Status: DC
  Administered 2024-11-17 – 2024-11-25 (×16): 1 via ORAL
  Filled 2024-11-17 (×18): qty 1

## 2024-11-17 MED ORDER — SORBITOL 70 % SOLN
30.0000 mL | Freq: Every day | Status: DC | PRN
  Administered 2024-11-18: 30 mL via ORAL
  Filled 2024-11-17: qty 30

## 2024-11-17 NOTE — Plan of Care (Signed)
" °  Problem: Consults Goal: RH STROKE PATIENT EDUCATION Description: See Patient Education module for education specifics  Outcome: Progressing Goal: Nutrition Consult-if indicated Outcome: Progressing Goal: Diabetes Guidelines if Diabetic/Glucose > 140 Description: If diabetic or lab glucose is > 140 mg/dl - Initiate Diabetes/Hyperglycemia Guidelines & Document Interventions  Outcome: Progressing   Problem: RH BOWEL ELIMINATION Goal: RH STG MANAGE BOWEL WITH ASSISTANCE Description: STG Manage Bowel with mod I Assistance. Outcome: Progressing Goal: RH STG MANAGE BOWEL W/MEDICATION W/ASSISTANCE Description: STG Manage Bowel with Medication with mod I Assistance. Outcome: Progressing   Problem: RH SAFETY Goal: RH STG ADHERE TO SAFETY PRECAUTIONS W/ASSISTANCE/DEVICE Description: STG Adhere to Safety Precautions With cues Assistance/Device. Outcome: Progressing   Problem: RH PAIN MANAGEMENT Goal: RH STG PAIN MANAGED AT OR BELOW PT'S PAIN GOAL Description: Pain < 4 with prns Outcome: Progressing   Problem: RH KNOWLEDGE DEFICIT Goal: RH STG INCREASE KNOWLEDGE OF DIABETES Description: Patient and dtr will be able to manage DM using educational resources for medications, and dietary modification independently Outcome: Progressing Goal: RH STG INCREASE KNOWLEDGE OF HYPERTENSION Description: Patient and dtr will be able to manage HTN using educational resources for medications, and dietary modification independently Outcome: Progressing Goal: RH STG INCREASE KNOWLEGDE OF HYPERLIPIDEMIA Description: Patient and dtr will be able to manage HLD using educational resources for medications, and dietary modification independently Outcome: Progressing Goal: RH STG INCREASE KNOWLEDGE OF STROKE PROPHYLAXIS Description: Patient and dtr will be able to manage secondary risks using educational resources for medications, and dietary modification independently Outcome: Progressing   "

## 2024-11-17 NOTE — Progress Notes (Signed)
 "                                                        PROGRESS NOTE   Subjective/Complaints:  No events overnight.  Vitals are stable. States she slept much better with resumption of trazodone .  Denies any grogginess today. Last bowel movement recorded 1-11, patient thinks she may have had 1 small bowel movement since then but not an adequate bowel movement.  Denies any abdominal pain, does have some mild nausea.   ROS: Positives per HPI above. Denies fevers, chills,  abdominal pain, SOB, chest pain, new weakness or paraesthesias.   +insomnia--improved + pain - RLE> LLE; with hypersensitivity + Constipation/nausea  Objective:   No results found. Recent Labs    11/15/24 0718  WBC 7.1  HGB 13.7  HCT 40.3  PLT 241   Recent Labs    11/15/24 0718  NA 133*  K 4.3  CL 99  CO2 26  GLUCOSE 139*  BUN 20  CREATININE 0.94  CALCIUM  9.1    Intake/Output Summary (Last 24 hours) at 11/17/2024 2202 Last data filed at 11/17/2024 1332 Gross per 24 hour  Intake 820 ml  Output --  Net 820 ml        Physical Exam: Vital Signs Blood pressure 129/82, pulse 68, temperature 98.4 F (36.9 C), temperature source Oral, resp. rate 17, height 5' 3 (1.6 m), weight 65.7 kg, SpO2 96%.   General: No acute distress.  Lying in bed. Mood and affect are appropriate == somewhat perseverative on medication Heart: Regular rate and rhythm no rubs murmurs or extra sounds Lungs: Clear to auscultation, breathing unlabored, no rales or wheezes Abdomen: Positive bowel sounds, soft nontender to palpation, nondistended Extremities: No clubbing, cyanosis, or edema Skin: No evidence of breakdown, no evidence of rash  Neurologic: Awake, alert, oriented to self and place, partially to time. Follows all commands appropriately  Cranial nerves II through XII intact, motor strength is 5/5 in Left and 4/5 R deltoid, bicep, tricep, grip, hip flexor, knee extensors, ankle dorsiflexor and plantar  flexor Sensory exam: Hypersensitive to light touch throughout the right ankle and calf; otherwise sensation intact  Musculoskeletal: Full range of motion in all 4 extremities. No joint swelling   Physical exam unchanged from the above on reexamination 11/17/24   Assessment/Plan: 1. Functional deficits which require 3+ hours per day of interdisciplinary therapy in a comprehensive inpatient rehab setting. Physiatrist is providing close team supervision and 24 hour management of active medical problems listed below. Physiatrist and rehab team continue to assess barriers to discharge/monitor patient progress toward functional and medical goals  Care Tool:  Bathing    Body parts bathed by patient: Right arm, Left upper leg, Left arm, Right lower leg, Chest, Abdomen, Left lower leg, Front perineal area, Face, Buttocks, Right upper leg   Body parts bathed by helper: Buttocks     Bathing assist Assist Level: Contact Guard/Touching assist     Upper Body Dressing/Undressing Upper body dressing   What is the patient wearing?: Pull over shirt    Upper body assist Assist Level: Supervision/Verbal cueing    Lower Body Dressing/Undressing Lower body dressing      What is the patient wearing?: Underwear/pull up, Pants, Ace wrap/stump shrinker     Lower body assist Assist for lower  body dressing: Supervision/Verbal cueing     Toileting Toileting    Toileting assist Assist for toileting: Supervision/Verbal cueing     Transfers Chair/bed transfer  Transfers assist     Chair/bed transfer assist level: Contact Guard/Touching assist     Locomotion Ambulation   Ambulation assist      Assist level: Minimal Assistance - Patient > 75% Assistive device: Walker-rolling Max distance: 200   Walk 10 feet activity   Assist     Assist level: Minimal Assistance - Patient > 75% Assistive device: Walker-rolling   Walk 50 feet activity   Assist    Assist level: Minimal  Assistance - Patient > 75% Assistive device: Walker-rolling    Walk 150 feet activity   Assist    Assist level: Minimal Assistance - Patient > 75% Assistive device: Walker-rolling    Walk 10 feet on uneven surface  activity   Assist     Assist level: Minimal Assistance - Patient > 75% Assistive device: Walker-rolling   Wheelchair     Assist Is the patient using a wheelchair?: Yes Type of Wheelchair: Manual    Wheelchair assist level: Moderate Assistance - Patient 50 - 74% Max wheelchair distance: 100    Wheelchair 50 feet with 2 turns activity    Assist        Assist Level: Moderate Assistance - Patient 50 - 74%   Wheelchair 150 feet activity     Assist      Assist Level: Maximal Assistance - Patient 25 - 49%   Blood pressure 129/82, pulse 68, temperature 98.4 F (36.9 C), temperature source Oral, resp. rate 17, height 5' 3 (1.6 m), weight 65.7 kg, SpO2 96%.  Medical Problem List and Plan: 1. Functional deficits secondary to left basal ganglia infarction as well as history of CVA 2023 without residual deficits             -patient may   shower             -ELOS/Goals: 7-10 days, Mod I PT/OT/SLP              - stable for IRF   2.  Antithrombotics: -DVT/anticoagulation:  Pharmaceutical: Lovenox              -antiplatelet therapy: Aspirin  81 mg daily and Plavix  75 mg daily x 3 weeks then aspirin  alone   3. Pain Management, chronic back pain (Severe spinal canal stenosis at L4-L5, Severe right and moderate left neuroforaminal stenosis at L5-S1 w/ [facility] 11/07/24): Neurontin  300 mg 3 times daily, Cymbalta  20 mg daily, hydrocodone  1 tablet twice daily PRN              - Home regimen: Norco 5 mg BID + gabapentin  300 mg BID              - Per notes, goal to titrate Duloxetine  to 60 mg for neuropathy  - Patient perseverative on timing of pain medication--ongoing 1-17, overall functional with current regimen   - 1-18: Reassured patient that gabapentin  is  scheduled 3 times daily  4. Mood/Behavior/Sleep: Ambien  5 mg nightly.  Provide emotional support             -antipsychotic agents: N/A              - was on ambien  10 mg at bedtime + ?trazodone  100 mg at bedtime at home; add trazodone  50 mg PRN 1-17: Scheduled trazodone  100 mg nightly per patient request--sleep much improved 1-18  5. Neuropsych/cognition: This patient is capable of making decisions on her own behalf. 6. Skin/Wound Care: Routine skin checks              - R antecubital IV site open w/ erythema - add neosporin 2x daily; monitor closely--appears improved     7. Fluids/Electrolytes/Nutrition: Routine in and outs with follow-up chemistries 8.  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  Follow-up Dr. Ole Holts.  Interrogation of pacemaker showed no A-fib 9.  Hypertension.  Norvasc  5 mg daily.  Monitor with increased mobility  - BP well-controlled Vitals:   11/17/24 1603 11/17/24 1923  BP: (!) 143/84 129/82  Pulse: 83 68  Resp: 18 17  Temp: 97.6 F (36.4 C) 98.4 F (36.9 C)  SpO2: 96% 96%    10.  Hyperlipidemia.  Crestor  11.  GERD.  Pepcid  12.  Constipation.  Last adequate bowel movement 1-11  - 1-18: Add Senokot-S 1 tab twice daily, MiraLAX  daily, and as needed sorbitol .  Nursing to give sorbitol  tonight if no bowel movement today      LOS: 3 days A FACE TO FACE EVALUATION WAS PERFORMED  Joesph JAYSON Likes 11/17/2024, 10:02 PM     "

## 2024-11-18 ENCOUNTER — Ambulatory Visit: Admitting: Diagnostic Neuroimaging

## 2024-11-18 DIAGNOSIS — I639 Cerebral infarction, unspecified: Secondary | ICD-10-CM | POA: Diagnosis not present

## 2024-11-18 NOTE — Progress Notes (Signed)
 "                                                        PROGRESS NOTE   Subjective/Complaints:  Remains moderately dysarthric    ROS: Positives per HPI above. Denies fevers, chills,  abdominal pain, SOB, chest pain, new weakness or paraesthesias.     Objective:   No results found. No results for input(s): WBC, HGB, HCT, PLT in the last 72 hours.  No results for input(s): NA, K, CL, CO2, GLUCOSE, BUN, CREATININE, CALCIUM  in the last 72 hours.   Intake/Output Summary (Last 24 hours) at 11/18/2024 1021 Last data filed at 11/18/2024 0737 Gross per 24 hour  Intake 628 ml  Output --  Net 628 ml        Physical Exam: Vital Signs Blood pressure 105/61, pulse 68, temperature 97.7 F (36.5 C), temperature source Oral, resp. rate 17, height 5' 3 (1.6 m), weight 65.7 kg, SpO2 96%.   General: No acute distress.  Lying in bed. Mood and affect are appropriate although mildly anxious  Heart: Regular rate and rhythm no rubs murmurs or extra sounds Lungs: Clear to auscultation, breathing unlabored, no rales or wheezes Abdomen: Positive bowel sounds, soft nontender to palpation, nondistended Extremities: No clubbing, cyanosis, or edema Skin: No evidence of breakdown, no evidence of rash  Neurologic: Awake, alert, oriented to self and place, partially to time. Follows all commands appropriately  Cranial nerves II through XII intact, motor strength is 5/5 in Left and 4+/5 R deltoid, bicep, tricep, grip, hip flexor, knee extensors, ankle dorsiflexor and plantar flexor Sensory exam: Light touch sensation intact Cerebellar intact FNF in BUEs   Musculoskeletal: Full range of motion in all 4 extremities. No joint swelling   Physical exam unchanged from the above on reexamination 11/18/24   Assessment/Plan: 1. Functional deficits which require 3+ hours per day of interdisciplinary therapy in a comprehensive inpatient rehab setting. Physiatrist is providing close  team supervision and 24 hour management of active medical problems listed below. Physiatrist and rehab team continue to assess barriers to discharge/monitor patient progress toward functional and medical goals  Care Tool:  Bathing    Body parts bathed by patient: Right arm, Left upper leg, Left arm, Right lower leg, Chest, Abdomen, Left lower leg, Front perineal area, Face, Buttocks, Right upper leg   Body parts bathed by helper: Buttocks     Bathing assist Assist Level: Contact Guard/Touching assist     Upper Body Dressing/Undressing Upper body dressing   What is the patient wearing?: Pull over shirt    Upper body assist Assist Level: Supervision/Verbal cueing    Lower Body Dressing/Undressing Lower body dressing      What is the patient wearing?: Underwear/pull up, Pants, Ace wrap/stump shrinker     Lower body assist Assist for lower body dressing: Supervision/Verbal cueing     Toileting Toileting    Toileting assist Assist for toileting: Supervision/Verbal cueing     Transfers Chair/bed transfer  Transfers assist     Chair/bed transfer assist level: Contact Guard/Touching assist     Locomotion Ambulation   Ambulation assist      Assist level: Minimal Assistance - Patient > 75% Assistive device: Walker-rolling Max distance: 200   Walk 10 feet activity   Assist     Assist level:  Minimal Assistance - Patient > 75% Assistive device: Walker-rolling   Walk 50 feet activity   Assist    Assist level: Minimal Assistance - Patient > 75% Assistive device: Walker-rolling    Walk 150 feet activity   Assist    Assist level: Minimal Assistance - Patient > 75% Assistive device: Walker-rolling    Walk 10 feet on uneven surface  activity   Assist     Assist level: Minimal Assistance - Patient > 75% Assistive device: Walker-rolling   Wheelchair     Assist Is the patient using a wheelchair?: Yes Type of Wheelchair: Manual     Wheelchair assist level: Moderate Assistance - Patient 50 - 74% Max wheelchair distance: 100    Wheelchair 50 feet with 2 turns activity    Assist        Assist Level: Moderate Assistance - Patient 50 - 74%   Wheelchair 150 feet activity     Assist      Assist Level: Maximal Assistance - Patient 25 - 49%   Blood pressure 105/61, pulse 68, temperature 97.7 F (36.5 C), temperature source Oral, resp. rate 17, height 5' 3 (1.6 m), weight 65.7 kg, SpO2 96%.  Medical Problem List and Plan: 1. Functional deficits secondary to left basal ganglia infarction as well as history of CVA 2023 without residual deficits             -patient may   shower             -ELOS/Goals: 7-10 days, Mod I PT/OT/SLP              - stable for IRF   2.  Antithrombotics: -DVT/anticoagulation:  Pharmaceutical: Lovenox              -antiplatelet therapy: Aspirin  81 mg daily and Plavix  75 mg daily x 3 weeks then aspirin  alone   3. 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 Mood/Behavior/Sleep: Ambien  5 mg nightly.  Provide emotional support             -antipsychotic agents: N/A              - was on ambien  10 mg at bedtime + ?trazodone  100 mg at bedtime at home; add trazodone  50 mg PRN 1-17: Scheduled trazodone  100 mg nightly per patient request--sleep much improved 1-18              5. Neuropsych/cognition: This patient is capable of making decisions on her own  behalf. 6. Skin/Wound Care: Routine skin checks              - R antecubital IV site open w/ erythema - add neosporin 2x daily; monitor closely--appears improved     7. Fluids/Electrolytes/Nutrition: Routine in and outs with follow-up chemistries 8.  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  Follow-up Dr. Ole  Cindie.  Interrogation of pacemaker showed no A-fib 9.  Hypertension.  Norvasc  5 mg daily.  Monitor with increased mobility  - BP well-controlled Vitals:   11/17/24 1923 11/18/24 0310  BP: 129/82 105/61  Pulse: 68 68  Resp: 17 17  Temp: 98.4 F (36.9 C) 97.7 F (36.5 C)  SpO2: 96% 96%    10.  Hyperlipidemia.  Crestor  11.  GERD.  Pepcid  12.  Constipation.  Last adequate bowel movement 1-11  - 1-18: Add Senokot-S 1 tab twice daily, MiraLAX  daily, and as needed sorbitol .  Nursing to give sorbitol  tonight if no bowel movement today      LOS: 4 days A FACE TO FACE EVALUATION WAS PERFORMED  Kayla Terry 11/18/2024, 10:21 AM     "

## 2024-11-18 NOTE — Progress Notes (Signed)
 Occupational Therapy Session Note  Patient Details  Name: Kayla Terry MRN: [id number] Date of Birth: May 18, 1946  Today's Date: 11/18/2024 OT Individual Time: 8975-8867 OT Individual Time Calculation (min): 68 min    Short Term Goals: Week 1:  OT Short Term Goal 1 (Week 1): STG=LTG d/t ELOS  Skilled Therapeutic Interventions/Progress Updates:   Pt greeted supine in bed, pt agreeable to OT intervention.      Transfers/bed mobility/functional mobility:  Pt completed supine>sit with MIN A to elevate trunk, pt reaching out for therapist to assist with task. Pt complete sit>stands with supervision. CGA provided for functional ambulation with no AD.   Therapeutic activity:  Pt completed Functional ambulation in hallway with no AD with pt instructed in functional reaching task with pt instructed to collect colored discs on both sides of hallway with a focus on dynamic balance and functional reaching. Pt only collected discs on R side first and needed MIN cues to locate discs on L side however pt reported that she thought this OTA wanted her to collect discs on R side first. Pt completed task with overall with CGA, pt reports the walking was the hardest part.   ADLs:  Grooming: pt combed hair with supervision in standing.  UB dressing:pt donned oH shirt with supervision.  LB dressing: pt donned underwear and pants with close supervision  Footwear: pt donned socks with figure 4 with set- up assist.   Bathing: pt completed bathing with supervision, of note pt didn't want to cleanse body with soap but only washed with water.  Transfers: ambulatory ADL transfer with no AD with CGA.   Toileting: pt with continent urine void needing supervision for 3/3 toileting tasks     Assessments:  Box and Blocks Test measures unilateral gross manual dexterity. - Instructions The pt was instructed to carry one block over at a time and go as quickly as they could, making sure their fingertips crossed  the partition. One minute was given to complete the task per UE. The pt was allowed a 15-second trial period prior to testing if needed. - Results The pt transferred 20 blocks with the R hand and  36 with the L hand. The total number of blocks carried from one compartment to the other in one minute is scored per hand. Higher scores on the test indicate better gross manual dexterity.   - Norms for adults females 50-75+ 50-54 R 77.7 L 74.3 55-59 R 74.7 L 73.6 60-64 R 76.1 L 73.6 65-69 R 72 L 71.3 70-74 R 68.6 L 68.3 75+ R 65.0 L 63.6  9 Hole Peg Test is used to measure finger dexterity in pts with various neurological diagnoses. - Instructions The pt was instructed to pick up the pegs one at a time, using their dominant hand first and put them into the holes in any order until the holes were all filled. The pt then removed the pegs one at a time and returned them to the container. Both hands were tested separately.  - Results The pt completed the test in (RUE )57 seconds, 35 secs ( LUE) . Scores are based on the time taken to complete the activity. The timer started the moment the pt touched the first peg until the moment the last peg hit the container.    - Norms for healthy females ages 84-70+ 2-55 R 17.38 L 18.92 56-60 R 17.86 L 19.48 61-65 R 18.99 L 20.33 66-70 R 19.90 L 21.44 71+ R 22.49 L 24.11  Ended session with pt supine in bed with all needs within reach and bed alarm activated.                    Therapy Documentation Precautions:  Precautions Precautions: Fall Recall of Precautions/Restrictions: Impaired Precaution/Restrictions Comments: R inattention Restrictions Weight Bearing Restrictions Per Provider Order: No  Pain: No pain    Therapy/Group: Individual Therapy  Ronal Gift [facility] 11/18/2024, 12:25 PM

## 2024-11-18 NOTE — Progress Notes (Signed)
 Patient ID: Kayla Terry, female   DOB: [birth year], 79 y.o.   MRN: [id number] Met with the patient to review current medical situation, rehab process, team conference and plan of care. Reviewed secondary risk management including previous stroke, HTN, HLD (LDL 162/Trig 163) on Crestor . Patient noted new stroke affected right UE, with noted dysarthria. Reviewed HH diet and medications including DAPT x 3 weeks then ASA solo. Patient reported taking medications as prescribed PTA. Discussed access to MyChart and given information for initiating MyChart use.  Continue to follow along to address educational needs to facilitate preparation for discharge. Kayla Terry

## 2024-11-18 NOTE — Progress Notes (Signed)
 Speech Language Pathology Daily Session Note  Patient Details  Name: Tenicia Gural MRN: [id number] Date of Birth: 02/12/1946  Today's Date: 11/18/2024 SLP Individual Time: 1300-1400 SLP Individual Time Calculation (min): 60 min  Short Term Goals: Week 1: SLP Short Term Goal 1 (Week 1): STGs=LTGs d/t ELOS  Skilled Therapeutic Interventions: SLP conducted skilled therapy session targeting cognitive-linguistic goals. Patient recalled activities completed in this morning's therapy sessions and speech therapy over the weekend with min assist. Patient oriented to date and holiday name with supervision. During divergent naming task, patient benefited from extra processing time and min cues, often via responsive naming prompts, to name 3-5 items in each. During convergent naming, paitent benefited from mod cues. In final minutes of session, facilitated mildly complex pattern replication task where patient benefited from model and supervision assist to accurately sequence shapes. At end of session, patient requested to transfer to commode. She transferred without AD with CGA where she was continent of bladder. Patient was left in room with call bell in reach and alarm set. SLP will continue to target goals per plan of care.        Pain  None endorsed  Therapy/Group: Individual Therapy  Lavinia Mcneely, M.A., CCC-SLP  Jenipher Havel A Emil Weigold 11/18/2024, 2:00 PM

## 2024-11-18 NOTE — Progress Notes (Signed)
 Physical Therapy Session Note  Patient Details  Name: Kayla Terry MRN: [id number] Date of Birth: 28-Mar-1946  Today's Date: 11/18/2024 PT Individual Time: 0805-0902 PT Individual Time Calculation (min): 57 min   Short Term Goals: Week 1:  PT Short Term Goal 1 (Week 1): STG = LTG 2/2 ELOS  Skilled Therapeutic Interventions/Progress Updates:  Patient supine in bed on entrance to room. Patient alert and agreeable to PT session.   Patient with no pain complaint at start of session. Does relate need to use bathroom.   Therapeutic Activity: Bed Mobility: Pt performed supine > sit with SBA. VC/ tc required for forward lean to unweight bottom in order to produce more effective scoot.   At end of session, she is able to bring RLE to bed surface with SBA and vc for positioning.   Transfers: Pt performed sit<>stand, stand pivot, and toilet transfers throughout session with CGA d/t inattention and reduced awareness. Provided vc/ tc throughout to improve attn to R hemibody and increase overall awareness of impairments, especially during more rote tasks.  Ambulatory transfer to toilet in bathroom with no AD use and CGA with HHA and intermittent furniture walking. Pt continent of b/b and performs pericare with supervision. Manages clothing with close supervision and handrail use. Requires vc to perform hand hygiene following.   Transported dependently to main therapy via w/c for time.   Neuromuscular Re-ed: NMR facilitated during session with focus on standing balance and awareness of impairments. Pt guided in Berg Balance Assessment. Patient demonstrates increased fall risk as noted by score of  35 /56 on Berg Balance Scale.  (<36= high risk for falls, close to 100%; 37-45 significant >80%; 46-51 moderate >50%; 52-55 lower >25%). Educated on purpose and standardization of test. Interpreted score for pt understanding. Largest impairments noted with NBOS and more dynamic tasks of test.   NMR  performed for improvements in motor control and coordination, balance, sequencing, judgement, and self confidence/ efficacy in performing all aspects of mobility at highest level of independence.   Gait Training:  Pt ambulated 105' x1 using no AD with HHA and close CGA with pt demonstrating reduced balance with increased lateral sway as well as slight decrease in stance time to RLE. Provided pt with RW and stance time equalizes per LE but continues to demo intermittent quick offload from RLE. Is able to reach 130' x1 using RW with CGA. With fatigue, lean to R increases. Provided vc/ tc for focus to R hemibody performance.  Patient supine in bed at end of session with brakes locked, bed alarm set, and all needs within reach.   Therapy Documentation Precautions:  Precautions Precautions: Fall Recall of Precautions/Restrictions: Impaired Precaution/Restrictions Comments: R inattention Restrictions Weight Bearing Restrictions Per Provider Order: No  Pain: No pain related this session.   Balance: Standardized Balance Assessment Standardized Balance Assessment: Berg Balance Test Berg Balance Test Sit to Stand: Able to stand  independently using hands Standing Unsupported: Able to stand 2 minutes with supervision Sitting with Back Unsupported but Feet Supported on Floor or Stool: Able to sit safely and securely 2 minutes Stand to Sit: Controls descent by using hands Transfers: Able to transfer with verbal cueing and /or supervision Standing Unsupported with Eyes Closed: Able to stand 10 seconds with supervision Standing Ubsupported with Feet Together: Able to place feet together independently but unable to hold for 30 seconds From Standing, Reach Forward with Outstretched Arm: Can reach forward >12 cm safely (5) From Standing Position, Pick up Object from  Floor: Able to pick up shoe, needs supervision From Standing Position, Turn to Look Behind Over each Shoulder: Looks behind one side  only/other side shows less weight shift Turn 360 Degrees: Able to turn 360 degrees safely but slowly Standing Unsupported, Alternately Place Feet on Step/Stool: Able to complete >2 steps/needs minimal assist Standing Unsupported, One Foot in Front: Able to take small step independently and hold 30 seconds Standing on One Leg: Tries to lift leg/unable to hold 3 seconds but remains standing independently Total Score: 35   Therapy/Group: Individual Therapy  Mliss DELENA Milliner PT, DPT, CSRS 11/18/2024, 8:21 AM

## 2024-11-19 DIAGNOSIS — I639 Cerebral infarction, unspecified: Secondary | ICD-10-CM | POA: Diagnosis not present

## 2024-11-19 NOTE — Progress Notes (Signed)
 Speech Language Pathology Daily Session Note  Patient Details  Name: Kayla Terry MRN: [id number] Date of Birth: July 12, 1946  Today's Date: 11/19/2024 SLP Individual Time: 0802-0901 SLP Individual Time Calculation (min): 59 min  Short Term Goals: Week 1: SLP Short Term Goal 1 (Week 1): STGs=LTGs d/t ELOS  Skilled Therapeutic Interventions:  Patient was seen in am to address communication. Pt was alert and seen at bedside. She was agreeable for session though she did endorse pain in feet. Pt communicated PLOF and recent medcal hx with overt instances of word finding difficulty including hesitations, semantic paraphasias, and neologisms. Pt verbalized biographical and family information indep. SLP engaging pt in responsive and divergent naming task. Pt completed responsive naming task with min A and completed a divergent naming task with min- mod A. Noted increased word finding difficulty in conversational exchanges including during rounds with MD. Pt inconsistently responsive to phonemic cues and more responsive given first letter. SLP also addressing cognition with pt oriented to month and day of week given external aid. SLP educating pt on 'BE FAST' stroke symptoms. Pt able to immediately recall and symptoms and then identify her symptoms with min A. At conclusion of session, pt was left upright in bed with call button within reach and bed alarm active. SLP to continue POC.   Pain Pain Assessment Pain Scale: 0-10 Pain Score: 10-Worst pain ever Pain Type: Chronic pain Pain Location: Foot Pain Orientation: Right;Left Pain Descriptors / Indicators: Constant;Aching  Therapy/Group: Individual Therapy  Joane GORMAN Fuss 11/19/2024, 8:07 AM

## 2024-11-19 NOTE — Plan of Care (Signed)
" °  Problem: RH BOWEL ELIMINATION Goal: RH STG MANAGE BOWEL WITH ASSISTANCE Description: STG Manage Bowel with mod I Assistance. Outcome: Progressing Goal: RH STG MANAGE BOWEL W/MEDICATION W/ASSISTANCE Description: STG Manage Bowel with Medication with mod I Assistance. Outcome: Progressing   Problem: RH SAFETY Goal: RH STG ADHERE TO SAFETY PRECAUTIONS W/ASSISTANCE/DEVICE Description: STG Adhere to Safety Precautions With cues Assistance/Device. Outcome: Progressing   Problem: RH PAIN MANAGEMENT Goal: RH STG PAIN MANAGED AT OR BELOW PT'S PAIN GOAL Description: Pain < 4 with prns Outcome: Progressing   Problem: RH KNOWLEDGE DEFICIT Goal: RH STG INCREASE KNOWLEDGE OF STROKE PROPHYLAXIS Description: Patient and dtr will be able to manage secondary risks using educational resources for medications, and dietary modification independently Outcome: Progressing   "

## 2024-11-19 NOTE — Progress Notes (Signed)
 Occupational Therapy Session Note  Patient Details  Name: Kayla Terry MRN: [id number] Date of Birth: 1945/11/25  Today's Date: 11/19/2024 OT Individual Time: 1020-1130 OT Individual Time Calculation (min): 70 min    Short Term Goals: Week 1:  OT Short Term Goal 1 (Week 1): STG=LTG d/t ELOS  Skilled Therapeutic Interventions/Progress Updates:    Pt received in bed and agreeable to showering. Pt did very well using safe movement patterns and no impulsivity to walk in room, to bathroom, and transfer to toilet and shower bench with only light CGA and no AD.  Supervision with bathing, dressing, toileting.  She agreed to use soap today on the washcloth as I encouraged her that method would help her to be the cleanest. Pt rested in bed for a few minutes and then stood at sink to brush teeth using R hand. No difficulty with opening and closing paste and applying it to brush.  Offered pt to take a short walk but she stated she was tired and asked to lay back in the bed.  Pt in bed with all need met and alarm set.   Pt states she has a bathtub with a built in seat. Will need to practice stepping over the tub wall.   Therapy Documentation Precautions:  Precautions Precautions: Fall Recall of Precautions/Restrictions: Impaired Precaution/Restrictions Comments: R inattention Restrictions Weight Bearing Restrictions Per Provider Order: No   Pain: Pain Assessment Pain Scale: 0-10 Pain Score: 6  Pain Location: Leg Pain Intervention(s): Medication (See eMAR) ADL: ADL Eating: Set up Grooming: Supervision/safety Where Assessed-Grooming: Standing at sink Upper Body Bathing: Supervision/safety Where Assessed-Upper Body Bathing: Shower Lower Body Bathing: Supervision/safety Where Assessed-Lower Body Bathing: Shower Upper Body Dressing: Supervision/safety Where Assessed-Upper Body Dressing: Edge of bed Lower Body Dressing: Supervision/safety Where Assessed-Lower Body Dressing: Edge of  bed Toileting: Supervision/safety Where Assessed-Toileting: Teacher, Adult Education: Furniture Conservator/restorer Method: Proofreader: Acupuncturist: Administrator, Arts Method: Designer, Industrial/product: Grab bars  Therapy/Group: Individual Therapy  Connie Hilgert 11/19/2024, 12:42 PM

## 2024-11-19 NOTE — Progress Notes (Signed)
 Physical Therapy Session Note  Patient Details  Name: Kayla Terry MRN: [id number] Date of Birth: Mar 16, 1946  Today's Date: 11/19/2024 PT Individual Time: 1305-1358 PT Individual Time Calculation (min): 53 min  Today's Date: 11/19/2024 PT Missed Time: 22 Minutes Missed Time Reason: Patient fatigue;Pain (pt reported soreness in B LE's and requested to return to room to rest)  Short Term Goals: Week 1:  PT Short Term Goal 1 (Week 1): STG = LTG 2/2 ELOS  Skilled Therapeutic Interventions/Progress Updates: Patient semi-reclined in bed on entrance to room. Patient alert and agreeable to PT session.   Patient reported unrated soreness/pain in B lower legs but not as bad as they were at arrival to inpatient rehab.  Therapeutic Activity: Bed Mobility: Pt performed supine<>sit on EOB with supervision and HOB elevated. Transfers: Pt performed sit<>stand transfers throughout session without AD and with CGA for safety.  Neuromuscular Re-ed: NMR facilitated during session with focus on dynamic standing balance, weight shift and coordination. - Pt navigated floor ladder with lateral steps and cues to perform cone taps with B LE, and then to pivot while in box prior to laterally stepping to next box. Pt overall required increased effort to tap with R LE vs L as L LE as L LE would begin to buckle a few times. Pt overall required minA for standing balance and multimodal cues for weight shift. Pt also cued to increase BOS when in box prior to tapping cone and to increase lateral lateral step length. Pt required seated rest and return to room due to reports of fatigue and B LE's soreness. Pt provided with heat pack at end of session to B Plantarflexors.   NMR performed for improvements in motor control and coordination, balance, sequencing, judgement, and self confidence/ efficacy in performing all aspects of mobility at highest level of independence.   Therapeutic Exercise: Pt performed the following  exercises with therapist providing the described cuing and facilitation for improvement. - B heel raise with UE support on table and pt cued to increase planter flexion and to control descent. 3 x 20 with pt provided with rest break seated  Manual Therapy: Palpation of B plantarflexors performed with trigger points/tension noted. Education and rationale provided with pt agreeing to participate in intervention. - Trigger point release to stated area with soft tissue mobilization to follow throughout. Pt reported soreness after this was performed with this PTA few days ago. PTA encouraged pt to increase water intake and to ask for hot pack during evening if heat helps decrease pain.   Patient semi-reclined in bed at end of session with brakes locked, friend present, bed alarm set, and all needs within reach.      Therapy Documentation Precautions:  Precautions Precautions: Fall Recall of Precautions/Restrictions: Impaired Precaution/Restrictions Comments: R inattention Restrictions Weight Bearing Restrictions Per Provider Order: No  Therapy/Group: Individual Therapy  Brucha Ahlquist PTA 11/19/2024, 2:02 PM

## 2024-11-19 NOTE — Progress Notes (Addendum)
 "                                                        PROGRESS NOTE   Subjective/Complaints:   No issues overnite , remains aphasic, severe word finding deficits  ROS: Positives per HPI above. Denies fevers, chills,  abdominal pain, SOB, chest pain, new weakness or paraesthesias.     Objective:   No results found. No results for input(s): WBC, HGB, HCT, PLT in the last 72 hours.  No results for input(s): NA, K, CL, CO2, GLUCOSE, BUN, CREATININE, CALCIUM  in the last 72 hours.   Intake/Output Summary (Last 24 hours) at 11/19/2024 0840 Last data filed at 11/18/2024 1751 Gross per 24 hour  Intake 416 ml  Output --  Net 416 ml        Physical Exam: Vital Signs Blood pressure 116/73, pulse 69, temperature 97.8 F (36.6 C), temperature source Oral, resp. rate 17, height 5' 3 (1.6 m), weight 65.7 kg, SpO2 98%.   General: No acute distress.  Lying in bed. Mood and affect are appropriate although mildly anxious  Heart: Regular rate and rhythm no rubs murmurs or extra sounds Lungs: Clear to auscultation, breathing unlabored, no rales or wheezes Abdomen: Positive bowel sounds, soft nontender to palpation, nondistended Extremities: No clubbing, cyanosis, or edema Skin: No evidence of breakdown, no evidence of rash  Neurologic: Awake, alert, oriented to self and place, partially to time. Follows all commands appropriately  Cranial nerves II through XII intact, motor strength is 5/5 in Left and 4+/5 R deltoid, bicep, tricep, grip, hip flexor, knee extensors, ankle dorsiflexor and plantar flexor Sensory exam: Light touch sensation intact Cerebellar intact FNF in BUEs   Musculoskeletal: Full range of motion in all 4 extremities. No joint swelling   Physical exam unchanged from the above on reexamination 11/19/24   Assessment/Plan: 1. Functional deficits which require 3+ hours per day of interdisciplinary therapy in a comprehensive inpatient rehab  setting. Physiatrist is providing close team supervision and 24 hour management of active medical problems listed below. Physiatrist and rehab team continue to assess barriers to discharge/monitor patient progress toward functional and medical goals  Care Tool:  Bathing    Body parts bathed by patient: Right arm, Left upper leg, Left arm, Right lower leg, Chest, Abdomen, Left lower leg, Front perineal area, Face, Buttocks, Right upper leg   Body parts bathed by helper: Buttocks     Bathing assist Assist Level: Supervision/Verbal cueing     Upper Body Dressing/Undressing Upper body dressing   What is the patient wearing?: Pull over shirt    Upper body assist Assist Level: Supervision/Verbal cueing    Lower Body Dressing/Undressing Lower body dressing      What is the patient wearing?: Pants, Underwear/pull up     Lower body assist Assist for lower body dressing: Supervision/Verbal cueing     Toileting Toileting    Toileting assist Assist for toileting: Supervision/Verbal cueing     Transfers Chair/bed transfer  Transfers assist     Chair/bed transfer assist level: Contact Guard/Touching assist     Locomotion Ambulation   Ambulation assist      Assist level: Minimal Assistance - Patient > 75% Assistive device: Walker-rolling Max distance: 200   Walk 10 feet activity   Assist  Assist level: Minimal Assistance - Patient > 75% Assistive device: Walker-rolling   Walk 50 feet activity   Assist    Assist level: Minimal Assistance - Patient > 75% Assistive device: Walker-rolling    Walk 150 feet activity   Assist    Assist level: Minimal Assistance - Patient > 75% Assistive device: Walker-rolling    Walk 10 feet on uneven surface  activity   Assist     Assist level: Minimal Assistance - Patient > 75% Assistive device: Walker-rolling   Wheelchair     Assist Is the patient using a wheelchair?: Yes Type of Wheelchair:  Manual    Wheelchair assist level: Moderate Assistance - Patient 50 - 74% Max wheelchair distance: 100    Wheelchair 50 feet with 2 turns activity    Assist        Assist Level: Moderate Assistance - Patient 50 - 74%   Wheelchair 150 feet activity     Assist      Assist Level: Maximal Assistance - Patient 25 - 49%   Blood pressure 116/73, pulse 69, temperature 97.8 F (36.6 C), temperature source Oral, resp. rate 17, height 5' 3 (1.6 m), weight 65.7 kg, SpO2 98%.  Medical Problem List and Plan: 1. Functional deficits secondary to left basal ganglia infarction as well as history of CVA 2023 without residual deficits             -patient may   shower             -ELOS/Goals: 7-10 days, Mod I PT/OT/SLP              - stable for IRF   2.  Antithrombotics: -DVT/anticoagulation:  Pharmaceutical: Lovenox              -antiplatelet therapy: Aspirin  81 mg daily and Plavix  75 mg daily x 3 weeks then aspirin  alone   3. 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 Mood/Behavior/Sleep: Ambien  5 mg nightly.  Provide emotional support             -antipsychotic agents: N/A              - was on ambien  10 mg at bedtime + ?trazodone  100 mg at bedtime at home; add trazodone  50 mg PRN 1-17: Scheduled trazodone  100 mg nightly per patient request--sleep much improved 1-18              5. Neuropsych/cognition: This patient is capable of making decisions on her own  behalf. 6. Skin/Wound Care: Routine skin checks              - R antecubital IV site open w/ erythema - add neosporin 2x daily; monitor closely--appears improved     7. Fluids/Electrolytes/Nutrition: Routine in and outs with follow-up chemistries 8.  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  Follow-up  Dr. Ole Holts.  Interrogation of pacemaker showed no A-fib 9.  Hypertension.  Norvasc  5 mg daily.  Monitor with increased mobility  - BP well-controlled Vitals:   11/18/24 2055 11/19/24 0449  BP:  116/73  Pulse: 64 69  Resp:  17  Temp:  97.8 F (36.6 C)  SpO2:  98%    10.  Hyperlipidemia.  Crestor  11.  GERD.  Pepcid  12.  Constipation.  BM x 3 on 1/19  - 1-18: Add Senokot-S 1 tab twice daily, MiraLAX  daily, and as needed sorbitol .        LOS: 5 days A FACE TO FACE EVALUATION WAS PERFORMED  Prentice FORBES Compton 11/19/2024, 8:40 AM     "

## 2024-11-20 ENCOUNTER — Encounter

## 2024-11-20 DIAGNOSIS — I639 Cerebral infarction, unspecified: Secondary | ICD-10-CM | POA: Diagnosis not present

## 2024-11-20 NOTE — Progress Notes (Signed)
 Speech Language Pathology Daily Session Note  Patient Details  Name: Tajah Noguchi MRN: [id number] Date of Birth: September 21, 1946  Today's Date: 11/20/2024 SLP Individual Time: 0830-0930 SLP Individual Time Calculation (min): 60 min  Short Term Goals: Week 1: SLP Short Term Goal 1 (Week 1): STGs=LTGs d/t ELOS  Skilled Therapeutic Interventions: SLP conducted skilled therapy session targeting cognitive-communication goals. Patient recalled events from prior date's therapy sessions with min to to light mod assist. She oriented to date with supervision with use of external memory aids. SLP then facilitated language comprehension-based problem solving task where patient identified information present on various store coupons with supervision assist. Patient then completed immediate memory task where she answered mildly complex questions re: visually presented image after image removal with 80% accuracy. Patient then completed mildly complex language comprehension task where she followed written 2-step directions with supervision-min assist for detail adherence. Patient demonstrated ongoing halting communication and decreased word finding during discussion with MD but was able to communicate ideas and wants/needs with overall supervision assist and increased time. Patient was left in room with call bell in reach and alarm set. SLP will continue to target goals per plan of care.        Pain  None endorsed  Therapy/Group: Individual Therapy  Abad Manard, M.A., CCC-SLP  Jasraj Lappe A Micheala Morissette 11/20/2024, 9:34 AM

## 2024-11-20 NOTE — Plan of Care (Signed)
" °  Problem: Consults Goal: RH STROKE PATIENT EDUCATION Description: See Patient Education module for education specifics  Outcome: Progressing Goal: Nutrition Consult-if indicated Outcome: Progressing Goal: Diabetes Guidelines if Diabetic/Glucose > 140 Description: If diabetic or lab glucose is > 140 mg/dl - Initiate Diabetes/Hyperglycemia Guidelines & Document Interventions  Outcome: Progressing   Problem: RH BOWEL ELIMINATION Goal: RH STG MANAGE BOWEL WITH ASSISTANCE Description: STG Manage Bowel with mod I Assistance. Outcome: Progressing Goal: RH STG MANAGE BOWEL W/MEDICATION W/ASSISTANCE Description: STG Manage Bowel with Medication with mod I Assistance. Outcome: Progressing   Problem: RH SAFETY Goal: RH STG ADHERE TO SAFETY PRECAUTIONS W/ASSISTANCE/DEVICE Description: STG Adhere to Safety Precautions With cues Assistance/Device. Outcome: Progressing   Problem: RH PAIN MANAGEMENT Goal: RH STG PAIN MANAGED AT OR BELOW PT'S PAIN GOAL Description: Pain < 4 with prns Outcome: Progressing   Problem: RH KNOWLEDGE DEFICIT Goal: RH STG INCREASE KNOWLEDGE OF DIABETES Description: Patient and dtr will be able to manage DM using educational resources for medications, and dietary modification independently Outcome: Progressing Goal: RH STG INCREASE KNOWLEDGE OF HYPERTENSION Description: Patient and dtr will be able to manage HTN using educational resources for medications, and dietary modification independently Outcome: Progressing Goal: RH STG INCREASE KNOWLEGDE OF HYPERLIPIDEMIA Description: Patient and dtr will be able to manage HLD using educational resources for medications, and dietary modification independently Outcome: Progressing Goal: RH STG INCREASE KNOWLEDGE OF STROKE PROPHYLAXIS Description: Patient and dtr will be able to manage secondary risks using educational resources for medications, and dietary modification independently Outcome: Progressing   "

## 2024-11-20 NOTE — Progress Notes (Addendum)
 Physical Therapy Session Note  Patient Details  Name: Kayla Terry MRN: [id number] Date of Birth: [birth year]  Today's Date: 11/20/2024 PT Individual Time: 8883-8845; 1335 - 1455 PT Individual Time Calculation (min): 38 min; 80 min   Short Term Goals: Week 1:  PT Short Term Goal 1 (Week 1): STG = LTG 2/2 ELOS  SESSION 1 Skilled Therapeutic Interventions/Progress Updates: Patient sitting in chair in main gym following OT session  Patient reported fatigue in B LE's but feels able to ambulate to day room gym closer to room. Pt ambulated throughout session (150'+) without AD and with overall close supervision. Pt with decreased B step/stride length and clearance. Since pt reported fatigue in B LE's, session was not cued to change gait pattern to avoid increasing fatigue for safety as there was no WC follow (focused more on the endurance aspect vs quality). Pt on NuStep 8 min on level 2 resistance for 2 min, then level 1 for 304 total steps x avg 29 spm; 0.2 miles and 1.4 avg METs in order to improve B LE/cardiovascular endurance with one rest break required. Pt initially cued to only use B LE's but required use of B UE's to further assist as well due to increase fatigue. Pt back in room and voided bladder with closed door supervision for safety/privacy. Pt performed sit<supine from EOB independently with bed flat.  Patient semi-reclined in bed at end of session with brakes locked, bed alarm set, and all needs within reach.  SESSION 2 Skilled Therapeutic Interventions/Progress Updates: Patient supine in bed on entrance to room. Patient alert and agreeable to PT session.   Patient reported no pain.   Therapeutic Activity: Bed Mobility: Pt performed supine<>sit on EOB independently.  Transfers: Pt performed sit<>stand transfers throughout session with supervision for safety and no AD - Pt ambulated throughout session without AD in order to improve safety/tolerance to community/household distances  (200'+) - Pt navigated 12 (6) steps with one hand rail (L) for first 8 blocks, then BHR on last block of 4 due to fatigue B LE's. Pt required VC to increase ensure neutral BOS vs narrow to avoid LOB laterally - Pt participated in floor transfer with overall supervision and pt requiring use of mat to stabilize self to stand from tall kneeling. Pt attempted to stand without UE support on elevated surface but required minA to stand. Pt educated to crawl self to reach to stable furniture to assist when at home and to self assess if pt had injured body when falling.   Neuromuscular Re-ed: NMR facilitated during session with focus on dynamic standing balance, coordination, proprioception, balance strategies. - static stance on airex pad x 2 min with close supervision. Pt required minA to stand on airex pad from compliant surface. Pt with neutral BOS.   - Pt progressed to CGA to stand to airex pad to stabilize, then cued to perform modified reverse chop with yellow theraband x 10 in B UE with pt requiring increased time/effort at beginning to understand mechanics/sequence (to keep UE anchored - maintained elbow extension - to lateral hip while contralateral UE performed motion)  - Pt standing on airex pad while tossing dodge ball to + 2 with CGA. Rehab tech instructed to toss ball in various directions that included randomly bouncing ball to pt. +2 also cued as pt progressed with not dropping the ball to increase speed of ball toss. Pt maintained eye contact on + 2 and used peripheral/proprioception to catch ball and progressed to close supervision and  few moments of using hip strategies to maintain standing balance.  - Pt ambulated around main gym and hallway with tidal tank in B UE while PTA provided perturbations to either side to further increase challenge to dynamic standing balance. 2 rounds performed with pt demonstrating no LOB. - Pt tossing horseshoes to target pole with supervision and cues for pt to  increase R UE extension behind hips to better coordinate toss and distance. Pt then cued to pick up horseshoes from floor with UE (PTA held horseshoes). Pt performed 2nd round with improve coordination using R UE and supervision throughout following cue to maintain increased R UE extension.   NMR performed for improvements in motor control and coordination, balance, sequencing, judgement, and self confidence/ efficacy in performing all aspects of mobility at highest level of independence.   Patient supine in bed at end of session with brakes locked, bed alarm set, and all needs within reach.       Therapy Documentation Precautions:  Precautions Precautions: Fall Recall of Precautions/Restrictions: Impaired Precaution/Restrictions Comments: R inattention Restrictions Weight Bearing Restrictions Per Provider Order: No  Therapy/Group: Individual Therapy  Tali Cleaves PTA 11/20/2024, 12:22 PM

## 2024-11-20 NOTE — Progress Notes (Signed)
 Patient ID: Kayla Terry, female   DOB: [birth year], 79 y.o.   MRN: [id number] Met with pt and spoke with daughter-Karen via telephone to discuss team conference goals of supervision level due to speech issues and for safety and discharge date of 1/27. Pt wanted it to be sooner aware daughter is coming in Monday at 3;00 for som education in preparation for discharge Tuesday. Discussed no equipment needs and follow up both wanted home health and have no preference. Will work on discharge needs. And let team know family education scheduled

## 2024-11-20 NOTE — Patient Care Conference (Signed)
 Inpatient RehabilitationTeam Conference and Plan of Care Update Date: 11/20/2024   Time: 10:09 AM    Patient Name: Kayla Terry      Medical Record Number: [id number]  Date of Birth: 1946-09-23 Sex: Female         Room/Bed: 4W08C/4W08C-01 Payor Info: Payor: [employer]ICARE / Plan: BC[employer] MEDICARE / Product Type: *No Product type* /    Admit Date/Time:  11/14/2024  2:59 PM  Primary Diagnosis:  Infarction of left basal ganglia [facility])  Hospital Problems: Principal Problem:   Infarction of left basal ganglia [facility])    Expected Discharge Date: Expected Discharge Date: 11/26/24  Team Members Present: Physician leading conference: Dr. Prentice Compton Social Worker Present: Rhoda Clement, LCSW Nurse Present: Barnie Ronde, RN PT Present: Sherlean Perks, PT;Dominic Freddi, PTA OT Present: [name] Maxwell, OT SLP Present: [name] Cowing, SLP PPS Coordinator present : Eleanor Colon, SLP     Current Status/Progress Goal Weekly Team Focus  Bowel/Bladder   Patient is continent of bowel and bladder. LBM 11/18/24    Patient will remain continent of bowel and bladder, and have adequate BM.   Encourage hydration and utilize laxatives as ordered.    Swallow/Nutrition/ Hydration               ADL's   CGA with ADL transfers without RW,  close S with self care, improved verbal expression, no impulsivity noted recently   supervision overall   ADL training, balance,  problem solving,  pt/fam education    Mobility   overall CGA   supervision for floor transfers and locomotion. modI for transfers  stairs, floor transfers, pt/caregiver education, dynamic standing balance, cognitive dual-tasks, ambulation    Communication   minA for very simple expression, modA for tasks of increased complexity - simple comprehension WFL, mild deficits noted w/ tasks of increased complexity   supervision-minA   pt/family education, functional language tasks    Safety/Cognition/ Behavioral  Observations               Pain   Patient will complaint of generalized pain ie. to hands and feet ranging from 6 to 10 on the pain scale.   The patient will have a decrease in pain with level less than 6.   Utilize non pharmaceutical alternatives to reduce pain like repositioning, offer analgesics as ordered for pain control    Skin   Patient skin will remain intact.   Patient skin will remain intact and free of infection  Skin care and prevention.      Discharge Planning:  Home with daughter who can stay at night and will work out if needed at during the day, which will need to due to supervision level goals set. Await team's recommendations   Team Discussion: Patient admitted post left basal ganglia CVA with severe anomia and lower extremity fatigue. Note improved error awareness.   Patient on target to meet rehab goals: yes, patient currently needs CGA for mobility without using an assistive device. Able to ambulate up to 150'. Needs min assist for simple cognitive tasks and max assist for complex tasks. Needs closer supervision for showering. Goals for discharge set for supervision overall.  *See Care Plan and progress notes for long and short-term goals.   Revisions to Treatment Plan:  Fall recovery/getting up off of the floor training   Teaching Needs: Safety, medications, dietary modification, transfers, toileting, etc.   Current Barriers to Discharge: Decreased caregiver support  Possible Resolutions to Barriers: Family education  Medical Summary Current Status: Continues with expressive aphasia.  Motor strength on the right side improving, blood pressure control is good, intermittent pain complaints lower extremities  Barriers to Discharge: Other (comments)  Barriers to Discharge Comments: Expressive language impeding independence. Possible Resolutions to [employer] Focus: Caregiver education, continue pain management   Continued Need for Acute  Rehabilitation Level of Care: The patient requires Terry medical management by a physician with specialized training in physical medicine and rehabilitation for the following reasons: Direction of a multidisciplinary physical rehabilitation program to maximize functional independence : Yes Medical management of patient stability for increased activity during participation in an intensive rehabilitation regime.: Yes Analysis of laboratory values and/or radiology reports with any subsequent need for medication adjustment and/or medical intervention. : Yes   I attest that I was present, lead the team conference, and concur with the assessment and plan of the team.   Fredericka Sober B 11/20/2024, 1:08 PM

## 2024-11-20 NOTE — Progress Notes (Addendum)
 "                                                        PROGRESS NOTE   Subjective/Complaints:   No issues overnite , remains aphasic, severe word finding deficits, working with SLP, discussed need for ongoing therapy after discharge   ROS: Positives per HPI above. Denies fevers, chills,  abdominal pain, SOB, chest pain, new weakness or paraesthesias.     Objective:   No results found. No results for input(s): WBC, HGB, HCT, PLT in the last 72 hours.  No results for input(s): NA, K, CL, CO2, GLUCOSE, BUN, CREATININE, CALCIUM  in the last 72 hours.   Intake/Output Summary (Last 24 hours) at 11/20/2024 0925 Last data filed at 11/20/2024 0700 Gross per 24 hour  Intake 572 ml  Output --  Net 572 ml        Physical Exam: Vital Signs Blood pressure 113/67, pulse 67, temperature 98 F (36.7 C), resp. rate 18, height 5' 3 (1.6 m), weight 65.7 kg, SpO2 97%.   General: No acute distress.  Lying in bed. Mood and affect are appropriate although mildly anxious  Heart: Regular rate and rhythm no rubs murmurs or extra sounds Lungs: Clear to auscultation, breathing unlabored, no rales or wheezes Abdomen: Positive bowel sounds, soft nontender to palpation, nondistended Extremities: No clubbing, cyanosis, or edema Skin: No evidence of breakdown, no evidence of rash  Neurologic: Awake, alert, oriented to self and place, partially to time. Follows all commands appropriately  Cranial nerves II through XII intact, motor strength is 5/5 in Left and 4+/5 R deltoid, bicep, tricep, grip, hip flexor, knee extensors, ankle dorsiflexor and plantar flexor Sensory exam: Light touch sensation intact Cerebellar intact FNF in BUEs   Musculoskeletal: Full range of motion in all 4 extremities. No joint swelling   Physical exam unchanged from the above on reexamination 11/20/24   Assessment/Plan: 1. Functional deficits which require 3+ hours per day of interdisciplinary  therapy in a comprehensive inpatient rehab setting. Physiatrist is providing close team supervision and 24 hour management of active medical problems listed below. Physiatrist and rehab team continue to assess barriers to discharge/monitor patient progress toward functional and medical goals  Care Tool:  Bathing    Body parts bathed by patient: Right arm, Left upper leg, Left arm, Right lower leg, Chest, Abdomen, Left lower leg, Front perineal area, Face, Buttocks, Right upper leg   Body parts bathed by helper: Buttocks     Bathing assist Assist Level: Supervision/Verbal cueing     Upper Body Dressing/Undressing Upper body dressing   What is the patient wearing?: Pull over shirt    Upper body assist Assist Level: Supervision/Verbal cueing    Lower Body Dressing/Undressing Lower body dressing      What is the patient wearing?: Pants, Underwear/pull up     Lower body assist Assist for lower body dressing: Supervision/Verbal cueing     Toileting Toileting    Toileting assist Assist for toileting: Supervision/Verbal cueing     Transfers Chair/bed transfer  Transfers assist     Chair/bed transfer assist level: Contact Guard/Touching assist     Locomotion Ambulation   Ambulation assist      Assist level: Minimal Assistance - Patient > 75% Assistive device: Walker-rolling Max distance: 200   Walk 10 feet  activity   Assist     Assist level: Minimal Assistance - Patient > 75% Assistive device: Walker-rolling   Walk 50 feet activity   Assist    Assist level: Minimal Assistance - Patient > 75% Assistive device: Walker-rolling    Walk 150 feet activity   Assist    Assist level: Minimal Assistance - Patient > 75% Assistive device: Walker-rolling    Walk 10 feet on uneven surface  activity   Assist     Assist level: Minimal Assistance - Patient > 75% Assistive device: Walker-rolling   Wheelchair     Assist Is the patient using a  wheelchair?: Yes Type of Wheelchair: Manual    Wheelchair assist level: Moderate Assistance - Patient 50 - 74% Max wheelchair distance: 100    Wheelchair 50 feet with 2 turns activity    Assist        Assist Level: Moderate Assistance - Patient 50 - 74%   Wheelchair 150 feet activity     Assist      Assist Level: Maximal Assistance - Patient 25 - 49%   Blood pressure 113/67, pulse 67, temperature 98 F (36.7 C), resp. rate 18, height 5' 3 (1.6 m), weight 65.7 kg, SpO2 97%.  Medical Problem List and Plan: 1. Functional deficits secondary to left basal ganglia infarction as well as history of CVA 2023 without residual deficits             -patient may   shower             -ELOS/Goals: 7-10 days, Mod I PT/OT/SLP          Team conference today please see physician documentation under team conference tab, met with team  to discuss problems,progress, and goals. Formulized individual treatment plan based on medical history, underlying problem and comorbidities.    2.  Antithrombotics: -DVT/anticoagulation:  Pharmaceutical: Lovenox              -antiplatelet therapy: Aspirin  81 mg daily and Plavix  75 mg daily x 3 weeks then aspirin  alone   3. 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 Mood/Behavior/Sleep: Ambien  5 mg nightly.  Provide emotional support             -antipsychotic agents: N/A              - was on ambien  10 mg at bedtime + ?trazodone   100 mg at bedtime at home; add trazodone  50 mg PRN 1-17: Scheduled trazodone  100 mg nightly per patient request--sleep much improved 1-18              5. Neuropsych/cognition: This patient is capable of making decisions on her own behalf. 6. Skin/Wound Care: Routine skin checks              - R antecubital IV site open w/ erythema - add  neosporin 2x daily; monitor closely--appears improved     7. Fluids/Electrolytes/Nutrition: Routine in and outs with follow-up chemistries 8.  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  Follow-up Dr. Ole Holts.  Interrogation of pacemaker showed no A-fib 9.  Hypertension.  Norvasc  5 mg daily.  Monitor with increased mobility  - BP well-controlled Vitals:   11/19/24 1947 11/20/24 0519  BP: (!) 140/74 113/67  Pulse: 61 67  Resp: 17 18  Temp: (!) 97.5 F (36.4 C) 98 F (36.7 C)  SpO2: 94% 97%    10.  Hyperlipidemia.  Crestor  11.  GERD.  Pepcid  12.  Constipation.  BM x 3 on 1/19  - 1-18: Add Senokot-S 1 tab twice daily, MiraLAX  daily, and as needed sorbitol .        LOS: 6 days A FACE TO FACE EVALUATION WAS PERFORMED  Prentice FORBES Compton 11/20/2024, 9:25 AM     "

## 2024-11-20 NOTE — Progress Notes (Signed)
 Occupational Therapy Session Note  Patient Details  Name: Kayla Terry MRN: [id number] Date of Birth: 03-19-1946  Today's Date: 11/20/2024 OT Individual Time: 1040-1115 OT Individual Time Calculation (min): 35 min    Short Term Goals: Week 1:  OT Short Term Goal 1 (Week 1): STG=LTG d/t ELOS  Skilled Therapeutic Interventions/Progress Updates:    Pt received in bed and I suggested that we work on walking to [employer] to participate in a live music show.  Pt initially fully declining saying I want to stay in the bed and watch the weather, I dont want to go anywhere I explained to pt that she has to participate as part of her rehab and then she was agreeable. Denied need to toilet.   Ambulated CGA with RW to gym Participated with UE AROM and coordination following my visual and tactile cues following the live music.  She had some difficulty with smooth movement patterns of RUE but has full ROM.   Ambulated to next gym to participate in R hand [facility]. Had pt write her name but it was very small and just barely legible.  Pt recognized that this is very different from her norm. Had pt copy simple shapes which she did but her copies were small but fairly accurate.  Pt holding pen correctly. Provided her with yellow soft theraputty.  Had pt work on grasping,  finger extension isometrics,and pinching. Pt participated well. Encouraged her to use the putty in her room. Hand off to PTA for her next session.  Therapy Documentation Precautions:  Precautions Precautions: Fall Recall of Precautions/Restrictions: Impaired Precaution/Restrictions Comments: R inattention Restrictions Weight Bearing Restrictions Per Provider Order: No   Pain: Pain Assessment Pain Scale: 0-10 Pain Score: 6  Pain Location: Leg Pain Orientation: Left;Right Pain Descriptors / Indicators: Constant Pain Onset: On-going Pain Intervention(s): Medication (See eMAR) ADL: ADL Eating: Set up Grooming:  Supervision/safety Where Assessed-Grooming: Standing at sink Upper Body Bathing: Supervision/safety Where Assessed-Upper Body Bathing: Shower Lower Body Bathing: Supervision/safety Where Assessed-Lower Body Bathing: Shower Upper Body Dressing: Supervision/safety Where Assessed-Upper Body Dressing: Edge of bed Lower Body Dressing: Supervision/safety Where Assessed-Lower Body Dressing: Edge of bed Toileting: Supervision/safety Where Assessed-Toileting: Teacher, Adult Education: Furniture Conservator/restorer Method: Proofreader: Acupuncturist: Administrator, Arts Method: Designer, Industrial/product: Grab bars   Therapy/Group: Individual Therapy  Abeer Deskins 11/20/2024, 10:12 AM

## 2024-11-21 NOTE — Plan of Care (Signed)
" °  Problem: Consults Goal: RH STROKE PATIENT EDUCATION Description: See Patient Education module for education specifics  Outcome: Progressing Goal: Nutrition Consult-if indicated Outcome: Progressing Goal: Diabetes Guidelines if Diabetic/Glucose > 140 Description: If diabetic or lab glucose is > 140 mg/dl - Initiate Diabetes/Hyperglycemia Guidelines & Document Interventions  Outcome: Progressing   Problem: RH BOWEL ELIMINATION Goal: RH STG MANAGE BOWEL WITH ASSISTANCE Description: STG Manage Bowel with mod I Assistance. Outcome: Progressing Goal: RH STG MANAGE BOWEL W/MEDICATION W/ASSISTANCE Description: STG Manage Bowel with Medication with mod I Assistance. Outcome: Progressing   Problem: RH SAFETY Goal: RH STG ADHERE TO SAFETY PRECAUTIONS W/ASSISTANCE/DEVICE Description: STG Adhere to Safety Precautions With cues Assistance/Device. Outcome: Progressing   Problem: RH KNOWLEDGE DEFICIT Goal: RH STG INCREASE KNOWLEDGE OF DIABETES Description: Patient and dtr will be able to manage DM using educational resources for medications, and dietary modification independently Outcome: Progressing Goal: RH STG INCREASE KNOWLEDGE OF HYPERTENSION Description: Patient and dtr will be able to manage HTN using educational resources for medications, and dietary modification independently Outcome: Progressing Goal: RH STG INCREASE KNOWLEGDE OF HYPERLIPIDEMIA Description: Patient and dtr will be able to manage HLD using educational resources for medications, and dietary modification independently Outcome: Progressing Goal: RH STG INCREASE KNOWLEDGE OF STROKE PROPHYLAXIS Description: Patient and dtr will be able to manage secondary risks using educational resources for medications, and dietary modification independently Outcome: Progressing   "

## 2024-11-21 NOTE — Group Note (Signed)
 Patient Details Name: Arcenia Scarbro MRN: [id number] DOB: Mar 14, 1946 Today's Date: 11/21/2024  Time Calculation: OT Group Time Calculation OT Group Start Time: 1100 OT Group Stop Time: 1200 OT Group Time Calculation (min): 60 min      Group Description: Dance Group: Pt participated in dance group with an emphasis on social interaction, motor planning, increasing overall activity tolerance and bimanual tasks. All songs were selected by group members. Dance moves included AROM of BUE/BLE gross motor movements with an emphasis on building functional endurance.   Individual level documentation: Patient completed group from sitting level. Patient needed supervision to complete various dance moves with OT providing visual model.  Patient able to create her own modifications during group.  Pain: Pain Assessment Pain Scale: 0-10 Pain Score: 5  Pain Type: Neuropathic pain Pain Location: Foot Pain Orientation: Right;Left Pain Descriptors / Indicators: Sore;Pins and needles Pain Intervention(s): Medication (See eMAR) provided prior to session by RN   Precautions:  Falls   Katheryn SQUIBB [facility] 11/21/2024, 12:20 PM

## 2024-11-21 NOTE — Progress Notes (Signed)
 Physical Therapy Session Note  Patient Details  Name: Kayla Terry MRN: [id number] Date of Birth: 24-Dec-1945  Today's Date: 11/21/2024 PT Individual Time: 8693-8653 PT Individual Time Calculation (min): 40 min   Short Term Goals: Week 1:  PT Short Term Goal 1 (Week 1): STG = LTG 2/2 ELOS  Skilled Therapeutic Interventions/Progress Updates: Patient supine in bed on entrance to room. Patient alert and agreeable to PT session.   Patient reported no pain  Therapeutic Activity: Bed Mobility: Pt performed supine<>sit on EOB independently. Transfers: Pt performed sit<>stand transfers throughout session with supervision for safety in preparation for functional mobility no AD. Pt ambulated short distance in room to void bladder with closed door supervision for safety no AD. Pt ambulate from room<>day room gym no AD in order to increase tolerance/safety to community distances. Pt with decreased step/stride length.  TUG (avg = 16.91s) without AD and with supervision  - 20.14s  - 16.18s  - 14.41s  Patient demonstrates increased fall risk as noted by score of 43/56 on Berg Balance Scale.  (<36= high risk for falls, close to 100%; 37-45 significant >80%; 46-51 moderate >50%; 52-55 lower >25%)  Neuromuscular Re-ed: NMR facilitated during session with focus on dynamic standing balance, coordination. - cone taps with alternating LE's. Pt required light modA at first to prevent lateral LOB and increased effort to tap R foot without knocking over cone and progressed to keeping cone upright after cue to increase hip flexion to doff. Pt progressed to minA after seated rest break - Pt ambulated in day room posteriorly with VC to increase step length and clearance (mostly step to pattern). Pt required CGA throughout  NMR performed for improvements in motor control and coordination, balance, sequencing, judgement, and self confidence/ efficacy in performing all aspects of mobility at highest level of  independence.   Patient supine in bed at end of session with brakes locked, bed alarm set, and all needs within reach.      Therapy Documentation Precautions:  Precautions Precautions: Fall Recall of Precautions/Restrictions: Impaired Precaution/Restrictions Comments: R inattention Restrictions Weight Bearing Restrictions Per Provider Order: No  Therapy/Group: Individual Therapy  Chett Taniguchi PTA 11/21/2024, 2:34 PM

## 2024-11-21 NOTE — Progress Notes (Signed)
 Occupational Therapy Session Note  Patient Details  Name: Kayla Terry MRN: [id number] Date of Birth: Oct 25, 1946  Today's Date: 11/21/2024 OT Individual Time: 1020-1115 OT Individual Time Calculation (min): 55 min    Short Term Goals: Week 1:  OT Short Term Goal 1 (Week 1): STG=LTG d/t ELOS  Skilled Therapeutic Interventions/Progress Updates:      Pt seen for BADL retraining of toileting, bathing, and dressing with a focus on dynamic balance, see ADL documentation below. Pt did well with standing tasks of oral care and grooming at sink,  ambulation in room, reaching to pick up clothing off the floor and placing in laundry bag (light CGA with reaching), opening closing small containers. She practiced application of lidocane cream to her feet.   She continues to tire easily and does need intermittent rest breaks.  No R inattention noted today.  Resting in bed with all needs met. Alarm set.   Therapy Documentation Precautions:  Precautions Precautions: Fall Recall of Precautions/Restrictions: Impaired Precaution/Restrictions Comments: R inattention Restrictions Weight Bearing Restrictions Per Provider Order: No    Vital Signs: Therapy Vitals Temp: 97.9 F (36.6 C) Pulse Rate: 66 Resp: 17 BP: 132/71 Patient Position (if appropriate): Lying Oxygen Therapy SpO2: 93 % O2 Device: Room Air Pain: Pain Assessment Pain Scale: 0-10 Pain Score: 5  Pain Type: Neuropathic pain Pain Location: Foot Pain Orientation: Right;Left Pain Descriptors / Indicators: Sore;Pins and needles Pain Intervention(s): RN made aware ADL: ADL Eating: Set up Grooming: Supervision/safety Where Assessed-Grooming: Standing at sink Upper Body Bathing: Supervision/safety Where Assessed-Upper Body Bathing: Shower Lower Body Bathing: Supervision/safety Where Assessed-Lower Body Bathing: Shower Upper Body Dressing: Setup Where Assessed-Upper Body Dressing: Edge of bed Lower Body Dressing:  Supervision/safety Where Assessed-Lower Body Dressing: Edge of bed Toileting: Supervision/safety Where Assessed-Toileting: Teacher, Adult Education: Furniture Conservator/restorer Method: Proofreader: Acupuncturist: Administrator, Arts Method: Designer, Industrial/product: Grab bars     Therapy/Group: Individual Therapy  [name] 11/21/2024, 9:58 AM

## 2024-11-21 NOTE — Progress Notes (Signed)
 Patient ID: Kayla Terry, female   DOB: [birth year], 79 y.o.   MRN: [id number] [name] has accepted her referral for home health. Daughter to come in Monday for eduction at 3:00-4:00. Team is aware of this

## 2024-11-21 NOTE — Progress Notes (Signed)
 "                                                        PROGRESS NOTE   Subjective/Complaints:  No issues overnite , discussed d/c date , pt was hoping to go home sooner.  We discussed d/c date was based on achieving greater independance  ROS: Positives per HPI above. Denies fevers, chills,  abdominal pain, SOB, chest pain, new weakness or paraesthesias.     Objective:   No results found. No results for input(s): WBC, HGB, HCT, PLT in the last 72 hours.  No results for input(s): NA, K, CL, CO2, GLUCOSE, BUN, CREATININE, CALCIUM  in the last 72 hours.   Intake/Output Summary (Last 24 hours) at 11/21/2024 1042 Last data filed at 11/21/2024 0750 Gross per 24 hour  Intake 546 ml  Output --  Net 546 ml        Physical Exam: Vital Signs Blood pressure 132/71, pulse 66, temperature 97.9 F (36.6 C), resp. rate 17, height 5' 3 (1.6 m), weight 65.7 kg, SpO2 93%.   General: No acute distress.  Lying in bed. Mood and affect are appropriate although mildly anxious  Heart: Regular rate and rhythm no rubs murmurs or extra sounds Lungs: Clear to auscultation, breathing unlabored, no rales or wheezes Abdomen: Positive bowel sounds, soft nontender to palpation, nondistended Extremities: No clubbing, cyanosis, or edema Skin: No evidence of breakdown, no evidence of rash  Neurologic: Awake, alert, oriented to self and place, partially to time. Follows all commands appropriately  Cranial nerves II through XII intact, motor strength is 5/5 in Left and 4+/5 R deltoid, bicep, tricep, grip, hip flexor, knee extensors, ankle dorsiflexor and plantar flexor Sensory exam: Light touch sensation intact Cerebellar intact FNF in BUEs   Musculoskeletal: Full range of motion in all 4 extremities. No joint swelling   Physical exam unchanged from the above on reexamination 11/21/24   Assessment/Plan: 1. Functional deficits which require 3+ hours per day of interdisciplinary  therapy in a comprehensive inpatient rehab setting. Physiatrist is providing close team supervision and 24 hour management of active medical problems listed below. Physiatrist and rehab team continue to assess barriers to discharge/monitor patient progress toward functional and medical goals  Care Tool:  Bathing    Body parts bathed by patient: Right arm, Left upper leg, Left arm, Right lower leg, Chest, Abdomen, Left lower leg, Front perineal area, Face, Buttocks, Right upper leg   Body parts bathed by helper: Buttocks     Bathing assist Assist Level: Supervision/Verbal cueing     Upper Body Dressing/Undressing Upper body dressing   What is the patient wearing?: Pull over shirt    Upper body assist Assist Level: Set up assist    Lower Body Dressing/Undressing Lower body dressing      What is the patient wearing?: Pants, Underwear/pull up     Lower body assist Assist for lower body dressing: Supervision/Verbal cueing     Toileting Toileting    Toileting assist Assist for toileting: Supervision/Verbal cueing     Transfers Chair/bed transfer  Transfers assist     Chair/bed transfer assist level: Contact Guard/Touching assist     Locomotion Ambulation   Ambulation assist      Assist level: Minimal Assistance - Patient > 75% Assistive device: Walker-rolling Max distance: 200  Walk 10 feet activity   Assist     Assist level: Minimal Assistance - Patient > 75% Assistive device: Walker-rolling   Walk 50 feet activity   Assist    Assist level: Minimal Assistance - Patient > 75% Assistive device: Walker-rolling    Walk 150 feet activity   Assist    Assist level: Minimal Assistance - Patient > 75% Assistive device: Walker-rolling    Walk 10 feet on uneven surface  activity   Assist     Assist level: Minimal Assistance - Patient > 75% Assistive device: Walker-rolling   Wheelchair     Assist Is the patient using a wheelchair?:  Yes Type of Wheelchair: Manual    Wheelchair assist level: Moderate Assistance - Patient 50 - 74% Max wheelchair distance: 100    Wheelchair 50 feet with 2 turns activity    Assist        Assist Level: Moderate Assistance - Patient 50 - 74%   Wheelchair 150 feet activity     Assist      Assist Level: Maximal Assistance - Patient 25 - 49%   Blood pressure 132/71, pulse 66, temperature 97.9 F (36.6 C), resp. rate 17, height 5' 3 (1.6 m), weight 65.7 kg, SpO2 93%.  Medical Problem List and Plan: 1. Functional deficits secondary to left basal ganglia infarction as well as history of CVA 2023 without residual deficits             -patient may   shower             -ELOS/Goals: 1/27 Mod I PT/OT/SLP     2.  Antithrombotics: -DVT/anticoagulation:  Pharmaceutical: Lovenox              -antiplatelet therapy: Aspirin  81 mg daily and Plavix  75 mg daily x 3 weeks then aspirin  alone   3. 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 Mood/Behavior/Sleep: Ambien  5 mg nightly.  Provide emotional support             -antipsychotic agents: N/A              - was on ambien  10 mg at bedtime + ?trazodone  100 mg at bedtime at home; add trazodone  50 mg PRN 1-17: Scheduled trazodone  100 mg nightly per patient request--sleep much improved 1-18              5. Neuropsych/cognition: This patient is capable of making decisions on her own behalf. 6. Skin/Wound Care: Routine  skin checks              - R antecubital IV site open w/ erythema - add neosporin 2x daily; monitor closely--appears improved     7. Fluids/Electrolytes/Nutrition: Routine in and outs with follow-up chemistries 8.  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  Follow-up Dr. Ole Holts.  Interrogation of pacemaker showed  no A-fib 9.  Hypertension.  Norvasc  5 mg daily.  Monitor with increased mobility  - BP well-controlled Vitals:   11/20/24 2029 11/21/24 0608  BP: 111/67 132/71  Pulse: 64 66  Resp: 17 17  Temp: 98 F (36.7 C) 97.9 F (36.6 C)  SpO2: 93% 93%    10.  Hyperlipidemia.  Crestor  11.  GERD.  Pepcid  12.  Constipation.  BM x 3 on 1/19  - 1-18: Add Senokot-S 1 tab twice daily, MiraLAX  daily, and as needed sorbitol .        LOS: 7 days A FACE TO FACE EVALUATION WAS PERFORMED  Prentice FORBES Compton 11/21/2024, 10:42 AM     "

## 2024-11-21 NOTE — Progress Notes (Signed)
 Speech Language Pathology Daily Session Note  Patient Details  Name: [name] MRN: [id number] Date of Birth: November 17, 1945  Today's Date: 11/21/2024 SLP Individual Time: 1415-1457 SLP Individual Time Calculation (min): 42 min  Short Term Goals: Week 1: SLP Short Term Goal 1 (Week 1): STGs=LTGs d/t ELOS  Skilled Therapeutic Interventions: SLP conducted skilled therapy session targeting cognitive communication goals. Patient described problem scenarios with supervision-assist for word finding during mildly complex expression. During mildly complex generative naming components of task, patient benefited from min to mod assist. In next task, patient was asked to compare two like objects, providing one similarity and one difference, where she benefited from mod assist. Patient was left in room with call bell in reach and alarm set. SLP will continue to target goals per plan of care.        Pain  None endorsed  Therapy/Group: Individual Therapy  Melitta Tigue A Brigetta Beckstrom 11/21/2024, 2:57 PM

## 2024-11-22 MED ORDER — POLYETHYLENE GLYCOL 3350 17 G PO PACK: 17.0000 g | PACK | Freq: Every day | ORAL | Status: AC

## 2024-11-22 MED ORDER — SENNOSIDES-DOCUSATE SODIUM 8.6-50 MG PO TABS: 1.0000 | ORAL_TABLET | Freq: Two times a day (BID) | ORAL | Status: AC

## 2024-11-22 MED ORDER — ACETAMINOPHEN 500 MG PO TABS: 500.0000 mg | ORAL_TABLET | Freq: Four times a day (QID) | ORAL | Status: AC | PRN

## 2024-11-22 MED ORDER — STROKE: EARLY STAGES OF RECOVERY BOOK
Freq: Once | Status: AC
  Filled 2024-11-22: qty 1

## 2024-11-22 NOTE — Progress Notes (Signed)
 Occupational Therapy Weekly Progress Note  Patient Details  Name: Kayla Terry MRN: [id number] Date of Birth: 12-24-1945  Beginning of progress report period: November 15, 2024 End of progress report period: November 22, 2024  Today's Date: 11/22/2024 OT Individual Time: 9164-9084 OT Individual Time Calculation (min): 40 min    STGs not initially set due to expected LOS.  LTGs modified due to patients progress.  Patient continues to demonstrate the following deficits: decreased coordination, decreased problem solving and decreased memory, and decreased standing balance and hemiplegia and therefore will continue to benefit from skilled OT intervention to enhance overall performance with BADL and iADL.  Patient progressing toward long term goals..  Plan of care revisions: .SABRA  LTGs upgraded due to progress with balance, coordination, awareness, and problem solving.    Problem: RH Balance Goal: LTG Patient will maintain dynamic standing with ADLs (OT) Description: LTG:  Patient will maintain dynamic standing balance with assist during activities of daily living (OT)  Flowsheets (Taken 11/22/2024 0848) LTG: Pt will maintain dynamic standing balance during ADLs with: Independent with assistive device   Problem: RH Grooming Goal: LTG Patient will perform grooming w/assist,cues/equip (OT) Description: LTG: Patient will perform grooming with assist, with/without cues using equipment (OT) Flowsheets (Taken 11/22/2024 0848) LTG: Pt will perform grooming with assistance level of: Independent   Problem: RH Dressing Goal: LTG Patient will perform upper body dressing (OT) Description: LTG Patient will perform upper body dressing with assist, with/without cues (OT). Flowsheets (Taken 11/22/2024 0848) LTG: Pt will perform upper body dressing with assistance level of: Independent Goal: LTG Patient will perform lower body dressing w/assist (OT) Description: LTG: Patient will perform lower body  dressing with assist, with/without cues in positioning using equipment (OT) Flowsheets (Taken 11/22/2024 0848) LTG: Pt will perform lower body dressing with assistance level of: Independent   Problem: RH Toileting Goal: LTG Patient will perform toileting task (3/3 steps) with assistance level (OT) Description: LTG: Patient will perform toileting task (3/3 steps) with assistance level (OT)  Flowsheets (Taken 11/22/2024 0848) LTG: Pt will perform toileting task (3/3 steps) with assistance level: Independent   Problem: RH Toilet Transfers Goal: LTG Patient will perform toilet transfers w/assist (OT) Description: LTG: Patient will perform toilet transfers with assist, with/without cues using equipment (OT) Flowsheets (Taken 11/22/2024 0848) LTG: Pt will perform toilet transfers with assistance level of: Independent  OT Short Term Goals Week 1:  OT Short Term Goal 1 (Week 1): STG=LTG d/t ELOS OT Short Term Goal 1 - Progress (Week 1): Progressing toward goal Week 2:  OT Short Term Goal 1 (Week 2): STGs  = LTGs  Skilled Therapeutic Interventions/Progress Updates:    Pt received in bed and declined shower as she had just bathed yesterday.  Pt got out of bed and ambulated to bathroom distant S. Toileted  independently and then stood at sink to brush teeth independently.  Changed LB clothing with no A using safe awareness to sit to manage clothing over feet.   Communicated needs well with full sentences.  Did a problem solving activity in which pt had to listen to a scenario and the state what she would do.  She was able to answer about 50% with good answers, the others she needed some cues to come up with a solution.    Call daughter to confirm home bathroom set up of a full walk in tub with almost no threshold and bars and seat at pt's home and at daughters home a walk in shower.  The shower seat could be moved to her house.  No DME needs for pt.  Pt resting in bed and hand off to PTA for next  session.  Therapy Documentation Precautions:  Precautions Precautions: Fall Recall of Precautions/Restrictions: Impaired Precaution/Restrictions Comments: R inattention Restrictions Weight Bearing Restrictions Per Provider Order: No    Vital Signs: Therapy Vitals Temp: 98 F (36.7 C) Pulse Rate: 70 Resp: 17 BP: 129/83 Patient Position (if appropriate): Lying Oxygen Therapy SpO2: 94 % O2 Device: Room Air Pain:   ADL: ADL Eating: Set up Grooming: Independent Where Assessed-Grooming: Standing at sink Upper Body Bathing: Supervision/safety Where Assessed-Upper Body Bathing: Shower Lower Body Bathing: Supervision/safety Where Assessed-Lower Body Bathing: Shower Upper Body Dressing: Independent Where Assessed-Upper Body Dressing: Edge of bed Lower Body Dressing: Modified independent Where Assessed-Lower Body Dressing: Edge of bed Toileting: Independent Where Assessed-Toileting: Teacher, Adult Education: Close supervision Statistician Method: Proofreader: Engineer, Manufacturing Systems Transfer: Administrator, Arts Method: Designer, Industrial/product: Grab bars  Therapy/Group: Individual Therapy  Kayla Terry 11/22/2024, 9:03 AM

## 2024-11-22 NOTE — Progress Notes (Signed)
 Speech Language Pathology Weekly Progress and Session Note  Patient Details  Name: Kayla Terry MRN: [id number] Date of Birth: [birth year]  Beginning of progress report period: November 15, 2024 End of progress report period: November 22, 2024  Today's Date: 11/22/2024 SLP Individual Time: 9269-9184 SLP Individual Time Calculation (min): 45 min  Short Term Goals: Week 1: SLP Short Term Goal 1 (Week 1): STGs=LTGs d/t ELOS SLP Short Term Goal 1 - Progress (Week 1): Progressing toward goal    New Short Term Goals: Week 2: SLP Short Term Goal 1 (Week 2): STGs=LTGs d/t ELOS  Weekly Progress Updates: Patient has made steady progress towards therapy goals, though ELOS has increased thus short term goals not originally written for week 1 of therapy stay. Patient benefits from min assist for expression of mildly complex/abstract ideas and supervision for comprehension of mildly complex information presented in both written and auditory format. Patient benefits from supervision to solve basic to mildly complex problems. Patient and family education ongoing. Patient will continue to benefit from skilled therapy services during remainder of CIR stay.     Intensity: Minumum of 1-2 x/day, 30 to 90 minutes Frequency: 3 to 5 out of 7 days Duration/Length of Stay: 1/27 Treatment/Interventions: Cognitive remediation/compensation;Speech/Language facilitation;Cueing hierarchy;Functional tasks;Therapeutic Activities;Internal/external aids;Patient/family education   Daily Session  Skilled Therapeutic Interventions: SLP conducted skilled therapy session targeting cognitive-linguistic goals. SLP facilitated various mildly complex word finding tasks. Patient filled in the blank of common phrases with supervision-min assist depending on phrase complexity. She selected accurate word from fo2 when provided adjectival descriptions. During mildly complex generative naming tasks, she required min assist. In final  minutes of session, targeted verbal based problem solving task where she solved time-based questions presented in auditory format with supervision-light min assist. Patient was left in room with call bell in reach and alarm set. SLP will continue to target goals per plan of care.        Pain  None endorsed  Therapy/Group: Individual Therapy  Kayla Terry, M.A., CCC-SLP  Kayla Terry 11/22/2024, 8:35 AM

## 2024-11-22 NOTE — Progress Notes (Signed)
 "                                                        PROGRESS NOTE   Subjective/Complaints:  No issues overnite remains with mild-mod aphasia, OT states goals have been advanced to SUp- Mod I, hope to achieve distant supervision by discharge   ROS: Positives per HPI above. Denies fevers, chills,  abdominal pain, SOB, chest pain, new weakness or paraesthesias.     Objective:   No results found. No results for input(s): WBC, HGB, HCT, PLT in the last 72 hours.  No results for input(s): NA, K, CL, CO2, GLUCOSE, BUN, CREATININE, CALCIUM  in the last 72 hours.   Intake/Output Summary (Last 24 hours) at 11/22/2024 0921 Last data filed at 11/22/2024 0800 Gross per 24 hour  Intake 236 ml  Output --  Net 236 ml        Physical Exam: Vital Signs Blood pressure 129/83, pulse 70, temperature 98 F (36.7 C), resp. rate 17, height 5' 3 (1.6 m), weight 65.7 kg, SpO2 94%.   General: No acute distress.  Lying in bed. Mood and affect are appropriate although mildly anxious  Heart: Regular rate and rhythm no rubs murmurs or extra sounds Lungs: Clear to auscultation, breathing unlabored, no rales or wheezes Abdomen: Positive bowel sounds, soft nontender to palpation, nondistended Extremities: No clubbing, cyanosis, or edema Skin: No evidence of breakdown, no evidence of rash  Neurologic: Awake, alert, oriented to self and place, partially to time. Follows all commands appropriately  Cranial nerves II through XII intact, motor strength is 5/5 in Left and 4+/5 R deltoid, bicep, tricep, grip, hip flexor, knee extensors, ankle dorsiflexor and plantar flexor Sensory exam: Light touch sensation intact Cerebellar intact FNF in BUEs   Musculoskeletal: Full range of motion in all 4 extremities. No joint swelling   Physical exam unchanged from the above on reexamination 11/22/24   Assessment/Plan: 1. Functional deficits which require 3+ hours per day of  interdisciplinary therapy in a comprehensive inpatient rehab setting. Physiatrist is providing close team supervision and 24 hour management of active medical problems listed below. Physiatrist and rehab team continue to assess barriers to discharge/monitor patient progress toward functional and medical goals  Care Tool:  Bathing    Body parts bathed by patient: Right arm, Left upper leg, Left arm, Right lower leg, Chest, Abdomen, Left lower leg, Front perineal area, Face, Buttocks, Right upper leg   Body parts bathed by helper: Buttocks     Bathing assist Assist Level: Supervision/Verbal cueing     Upper Body Dressing/Undressing Upper body dressing   What is the patient wearing?: Pull over shirt    Upper body assist Assist Level: Set up assist    Lower Body Dressing/Undressing Lower body dressing      What is the patient wearing?: Pants, Underwear/pull up     Lower body assist Assist for lower body dressing: Supervision/Verbal cueing     Toileting Toileting    Toileting assist Assist for toileting: Supervision/Verbal cueing     Transfers Chair/bed transfer  Transfers assist     Chair/bed transfer assist level: Contact Guard/Touching assist     Locomotion Ambulation   Ambulation assist      Assist level: Minimal Assistance - Patient > 75% Assistive device: Walker-rolling Max distance: 200  Walk 10 feet activity   Assist     Assist level: Minimal Assistance - Patient > 75% Assistive device: Walker-rolling   Walk 50 feet activity   Assist    Assist level: Minimal Assistance - Patient > 75% Assistive device: Walker-rolling    Walk 150 feet activity   Assist    Assist level: Minimal Assistance - Patient > 75% Assistive device: Walker-rolling    Walk 10 feet on uneven surface  activity   Assist     Assist level: Minimal Assistance - Patient > 75% Assistive device: Walker-rolling   Wheelchair     Assist Is the patient  using a wheelchair?: Yes Type of Wheelchair: Manual    Wheelchair assist level: Moderate Assistance - Patient 50 - 74% Max wheelchair distance: 100    Wheelchair 50 feet with 2 turns activity    Assist        Assist Level: Moderate Assistance - Patient 50 - 74%   Wheelchair 150 feet activity     Assist      Assist Level: Maximal Assistance - Patient 25 - 49%   Blood pressure 129/83, pulse 70, temperature 98 F (36.7 C), resp. rate 17, height 5' 3 (1.6 m), weight 65.7 kg, SpO2 94%.  Medical Problem List and Plan: 1. Functional deficits secondary to left basal ganglia infarction as well as history of CVA 2023 without residual deficits             -patient may   shower             -ELOS/Goals: 1/27 Mod I PT/OT/SLP     2.  Antithrombotics: -DVT/anticoagulation:  Pharmaceutical: Lovenox              -antiplatelet therapy: Aspirin  81 mg daily and Plavix  75 mg daily x 3 weeks then aspirin  alone   3. 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 Mood/Behavior/Sleep: Ambien  5 mg nightly.  Provide emotional support             -antipsychotic agents: N/A              - was on ambien  10 mg at bedtime + ?trazodone  100 mg at bedtime at home; add trazodone  50 mg PRN 1-17: Scheduled trazodone  100 mg nightly per patient request--sleep much improved 1-18              5. Neuropsych/cognition: This patient is capable of making decisions on her own behalf. 6.  Skin/Wound Care: Routine skin checks              - R antecubital IV site open w/ erythema - add neosporin 2x daily; monitor closely--appears improved     7. Fluids/Electrolytes/Nutrition: Routine in and outs with follow-up chemistries 8.  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  Follow-up Dr. Ole Holts.  Interrogation of pacemaker showed  no A-fib 9.  Hypertension.  Norvasc  5 mg daily.  Monitor with increased mobility  - BP well-controlled Vitals:   11/21/24 1921 11/22/24 0537  BP: 123/75 129/83  Pulse: 66 70  Resp: 17 17  Temp: 97.7 F (36.5 C) 98 F (36.7 C)  SpO2: 94% 94%    10.  Hyperlipidemia.  Crestor  11.  GERD.  Pepcid  12.  Constipation.  BM x 3 on 1/19  - 1-18: Add Senokot-S 1 tab twice daily, MiraLAX  daily, and as needed sorbitol .        LOS: 8 days A FACE TO FACE EVALUATION WAS PERFORMED  Kayla Terry 11/22/2024, 9:22 AM     "

## 2024-11-22 NOTE — Progress Notes (Signed)
 Physical Therapy Session Note  Patient Details  Name: Kayla Terry MRN: [id number] Date of Birth: 1946-03-03  Today's Date: 11/22/2024 PT Individual Time: 9081-8986; 1300 - 1330 PT Individual Time Calculation (min): 55 min; 30 min   Short Term Goals: Week 1:  PT Short Term Goal 1 (Week 1): STG = LTG 2/2 ELOS  SESSION 1 Skilled Therapeutic Interventions/Progress Updates: Patient supine in bed following OT session on entrance to room. Patient alert and agreeable to PT session.   Patient reported unrated pain in B feet (nsg provided ice pack at end of session). PTA palpated B plantar fascia and plantarflexors with added knee flexor/plantarflexor stretch with pt reporting decrease in tightness/pain since PTA provided manual therapy during previous sessions.   Therapeutic Activity: Bed Mobility: Pt performed supine<>sit on EOB independently Transfers: Pt performed sit<>stand transfers throughout session with supervision.   6 Min Walk Test:  Instructed patient to ambulate as quickly and as safely as possible for 6 minutes using LRAD. Patient was allowed to take standing rest breaks without stopping the test, but if the patient required a sitting rest break the clock would be stopped and the test would be over.  Results: 875 feet (266.7 meters, Avg speed 0.30m/s) using no AD with supervision. Results indicate that the patient has reduced endurance with ambulation compared to age matched norms.  Age Matched Norms: 39-69 yo M: 50 F: 77, 58-79 yo M: 19 F: 471, 23-89 yo M: 417 F: 392 MDC: 58.21 meters (190.98 feet) or 50 meters (ANPTA Core Set of Outcome Measures for Adults with Neurologic Conditions, 2018)  Pt ambulated throughout session with close supervision no AD. Pt presented with decrease in R sided awareness and required cues to increase visual track and safety to R to avoid bumping into objects.  Neuromuscular Re-ed: NMR facilitated during session with focus on coordination, dynamic  standing balance. - pt walking cones stacked with disc in middle (big base to big base) in R UE from one table to another in day room with cue to softly step to decrease chance of cone and disc falling to ground. Pt cued to stop and reassess if beginning to have increase in wobble. One cone fell after placing on table and pt picked up with supervision. 3 rounds performed with close supervision and pt improving ability to ambulate with cones with increased stability in holding cups stiller.   NMR performed for improvements in motor control and coordination, balance, sequencing, judgement, and self confidence/ efficacy in performing all aspects of mobility at highest level of independence.   Patient supine in bed at end of session with brakes locked, bed alarm set, and all needs within reach.  SESSION 2 Skilled Therapeutic Interventions/Progress Updates: Patient semi-reclined in bed on entrance to room. Patient alert and agreeable to PT session.   Patient reported B feet feel better after donning of ice pack from this morning. Session focused on UE therex to avoid further aggravating feet.   Therapeutic Activity: Bed Mobility: Pt performed supine<>sit on EOB independently. Transfers: Pt performed sit<>stand transfers throughout session with supervision for safety and no AD. Pt ambulated from room<>day room gym without AD and with close supervision/light CGA for safety and cue to increase R sided safety awareness to avoid bumping into objects on R.   Therapeutic Exercise: Pt performed the following exercises with therapist providing the described cuing and facilitation for improvement. - modified B UE chops with soccer ball. Pt required multimodal cues and demonstration of mechanics to increase external/internal  rotation (point thumbs back) from hip to above shoulder height. Pt performed 2 rounds (until fatigue) starting on both sides of hip. Pt reported increase in dizziness when performing that  decreased as reps progressed.  - B UE horizontal abduction with yellow theraband and cue for mechanics (keep UE close to 90*). 2 x until fatigue - B supinated bicep curl sitting with cue to touch bar to knee and flex elbow up to under chin. 2 x until fatigue - B UE external rotation with yellow theraband and cue to keep thumbs pointed up. Pt required x 10 without band to understand mechanics of keeping elbows tucked into side. 2 x 10  Patient semi-reclined in bed at end of session with brakes locked, bed alarm set, and all needs within reach.       Therapy Documentation Precautions:  Precautions Precautions: Fall Recall of Precautions/Restrictions: Impaired Precaution/Restrictions Comments: R inattention Restrictions Weight Bearing Restrictions Per Provider Order: No  Therapy/Group: Individual Therapy  Chasmine Lender PTA 11/22/2024, 12:26 PM

## 2024-11-22 NOTE — Plan of Care (Signed)
" °  Problem: Consults Goal: RH STROKE PATIENT EDUCATION Description: See Patient Education module for education specifics  Outcome: Progressing Goal: Nutrition Consult-if indicated Outcome: Progressing Goal: Diabetes Guidelines if Diabetic/Glucose > 140 Description: If diabetic or lab glucose is > 140 mg/dl - Initiate Diabetes/Hyperglycemia Guidelines & Document Interventions  Outcome: Progressing   Problem: RH BOWEL ELIMINATION Goal: RH STG MANAGE BOWEL WITH ASSISTANCE Description: STG Manage Bowel with mod I Assistance. Outcome: Progressing Goal: RH STG MANAGE BOWEL W/MEDICATION W/ASSISTANCE Description: STG Manage Bowel with Medication with mod I Assistance. Outcome: Progressing   Problem: RH SAFETY Goal: RH STG ADHERE TO SAFETY PRECAUTIONS W/ASSISTANCE/DEVICE Description: STG Adhere to Safety Precautions With cues Assistance/Device. Outcome: Progressing   Problem: RH PAIN MANAGEMENT Goal: RH STG PAIN MANAGED AT OR BELOW PT'S PAIN GOAL Description: Pain < 4 with prns Outcome: Progressing   Problem: RH KNOWLEDGE DEFICIT Goal: RH STG INCREASE KNOWLEDGE OF DIABETES Description: Patient and dtr will be able to manage DM using educational resources for medications, and dietary modification independently Outcome: Progressing Goal: RH STG INCREASE KNOWLEDGE OF HYPERTENSION Description: Patient and dtr will be able to manage HTN using educational resources for medications, and dietary modification independently Outcome: Progressing Goal: RH STG INCREASE KNOWLEGDE OF HYPERLIPIDEMIA Description: Patient and dtr will be able to manage HLD using educational resources for medications, and dietary modification independently Outcome: Progressing Goal: RH STG INCREASE KNOWLEDGE OF STROKE PROPHYLAXIS Description: Patient and dtr will be able to manage secondary risks using educational resources for medications, and dietary modification independently Outcome: Progressing   "

## 2024-11-22 NOTE — Plan of Care (Signed)
 LTGs upgraded due to progress with balance, coordination, awareness, and problem solving.   Problem: RH Balance Goal: LTG Patient will maintain dynamic standing with ADLs (OT) Description: LTG:  Patient will maintain dynamic standing balance with assist during activities of daily living (OT)  Flowsheets (Taken 11/22/2024 0848) LTG: Pt will maintain dynamic standing balance during ADLs with: Independent with assistive device   Problem: RH Grooming Goal: LTG Patient will perform grooming w/assist,cues/equip (OT) Description: LTG: Patient will perform grooming with assist, with/without cues using equipment (OT) Flowsheets (Taken 11/22/2024 0848) LTG: Pt will perform grooming with assistance level of: Independent   Problem: RH Dressing Goal: LTG Patient will perform upper body dressing (OT) Description: LTG Patient will perform upper body dressing with assist, with/without cues (OT). Flowsheets (Taken 11/22/2024 0848) LTG: Pt will perform upper body dressing with assistance level of: Independent Goal: LTG Patient will perform lower body dressing w/assist (OT) Description: LTG: Patient will perform lower body dressing with assist, with/without cues in positioning using equipment (OT) Flowsheets (Taken 11/22/2024 0848) LTG: Pt will perform lower body dressing with assistance level of: Independent   Problem: RH Toileting Goal: LTG Patient will perform toileting task (3/3 steps) with assistance level (OT) Description: LTG: Patient will perform toileting task (3/3 steps) with assistance level (OT)  Flowsheets (Taken 11/22/2024 0848) LTG: Pt will perform toileting task (3/3 steps) with assistance level: Independent   Problem: RH Toilet Transfers Goal: LTG Patient will perform toilet transfers w/assist (OT) Description: LTG: Patient will perform toilet transfers with assist, with/without cues using equipment (OT) Flowsheets (Taken 11/22/2024 0848) LTG: Pt will perform toilet transfers with  assistance level of: Independent

## 2024-11-23 DIAGNOSIS — I10 Essential (primary) hypertension: Secondary | ICD-10-CM

## 2024-11-23 DIAGNOSIS — K5900 Constipation, unspecified: Secondary | ICD-10-CM

## 2024-11-23 MED ORDER — SORBITOL 70 % SOLN
30.0000 mL | Freq: Once | Status: AC
  Administered 2024-11-24: 30 mL via ORAL
  Filled 2024-11-23 (×2): qty 30

## 2024-11-23 NOTE — Progress Notes (Signed)
 Physical Therapy Weekly Progress Note  Patient Details  Name: Kayla Terry MRN: [id number] Date of Birth: 06-04-1946  Beginning of progress report period: November 15, 2024 End of progress report period: November 23, 2024  No STG's set in place due to extended length of stay. Pt currently is independent with bed mobility, and transfers/ambulates/navigates stairs without AD with overall supervision. Pt presents with decreased R sided safety awareness when becoming fatigue and has increased risk to bump into objects/have mild gait deviations and mild LOB (able to maintain standing with CGA for safety). Pt currently set to d/c to daughter's place of residence and is on track to meet LTG's.   Patient continues to demonstrate the following deficits muscle weakness, decreased coordination, decreased attention to right, decreased safety awareness and decreased memory, and decreased standing balance, decreased balance strategies, and mild hemipareisis and therefore will continue to benefit from skilled PT intervention to increase functional independence with mobility.  Patient progressing toward long term goals..  Continue plan of care.  Skilled Therapeutic Interventions/Progress Updates:  Ambulation/gait training;Community reintegration;Neuromuscular re-education;Stair training;UE/LE Strength taining/ROM;UE/LE Coordination activities;Therapeutic Activities;Discharge planning;Balance/vestibular training;Functional mobility training;Patient/family education;Therapeutic Exercise   Therapy Documentation Precautions:  Precautions Precautions: Fall Recall of Precautions/Restrictions: Impaired Precaution/Restrictions Comments: R inattention Restrictions Weight Bearing Restrictions Per Provider Order: No  Dominic Sandoval PTA Mliss DELENA Milliner PT, DPT, CSRS 11/23/2024, 7:52 AM

## 2024-11-23 NOTE — Progress Notes (Signed)
 Occupational Therapy Session Note  Patient Details  Name: Kayla Terry MRN: [id number] Date of Birth: August 25, 1946  Today's Date: 11/23/2024  OT Individual Time: [id number]( session 1)  OT Individual Time Calculation (min): 43 min   OT Individual Time: 1349-1450( session 2)  OT Individual Time Calculation (min): 61 min   Short Term Goals: Week 2:  OT Short Term Goal 1 (Week 2): STGs  = LTGs  Skilled Therapeutic Interventions/Progress Updates:  Session 1: Pt greeted supine in bed, pt agreeable to OT intervention.    No pain reported during session.   Transfers/bed mobility/functional mobility:  Pt completed supine<>sit with supervision.  Pt completed all sit>stands with no Ad and supervision. Pt completed functional ambulation greater than a household distane with no AD and supervision.   Therapeutic activity:  Pt completed dual processing task with pt instructed to read questions in reference to map placed on opposite side of gym and walk to map to answer questions to facilitate improvements in problem solving, dual processing and working memory. Pt needed MOD verbal cues to complete task with accuracy. Pt most surprised by impaired hand writing in R hand when writing correct answers. Noted R inattention during task.                  Ended session with pt supine in bed with all needs within reach and bed alarm activated.                         Session 2: Pt greeted supine in bed, pt reporting fatigue but agreeable to OT intervention from bed level. No pain reported during session   Therapeutic activity:  Engaged pt in home safety questions to practice higher level problem solving skills and facilitate improved fluency with speech. Pt completed task with 80% accuracy, increased time needed to get her words out.   Pt completed FMC task from semi reclined in bed with pt shown example of geometric structure with pt instructed to duplicate struture from visual aid provided with  RUE. Pt completed task with 100% but minor bobbles noted.   Pt completed pill box activity to prepare for IADLS at home with pt given 5 medicines to distribute correctly into weekly pillbox. Pt required max step by step cues to complete task with accuracy. Discussed organizational strategies such as completing each medication one at a time and then removing medication from work area as well as opening up compartments first to plan where pills need to go.                  Ended session with pt supine in bed with all needs within reach and bed alarm activated.                    Therapy Documentation Precautions:  Precautions Precautions: Fall Recall of Precautions/Restrictions: Impaired Precaution/Restrictions Comments: R inattention Restrictions Weight Bearing Restrictions Per Provider Order: No    Therapy/Group: Individual Therapy  Ronal Mallie Needy 11/23/2024, 2:55 PM

## 2024-11-23 NOTE — Progress Notes (Signed)
 Physical Therapy Session Note  Patient Details  Name: Kayla Terry MRN: [id number] Date of Birth: 1945-12-16  Today's Date: 11/23/2024 PT Individual Time: 1031-1113 PT Individual Time Calculation (min): 42 min   Short Term Goals: Week 1:  PT Short Term Goal 1 (Week 1): STG = LTG 2/2 ELOS PT Short Term Goal 1 - Progress (Week 1): Progressing toward goal  Skilled Therapeutic Interventions/Progress Updates: Patient semi-reclined in bed on entrance to room. Patient alert and agreeable to PT session.   Patient reported no pain  Therapeutic Activity: Bed Mobility: Pt performed supine<>sit on EOB independently. Transfers: Pt performed sit<>stand transfers throughout session modI no AD. Pt ambulated throughout session no AD and with supervision for safety and pt presenting with decreased awareness to R side that increases with fatigue (bumped into door on R)  Neuromuscular Re-ed: NMR facilitated during session with focus on dynamic standing balance, proprioception. - Hurdle navigation of various heights alternating between 6 to 9. Pt required increased effort/attempts to step over 9 step safely but was able to do so with light minA. Pt navigated hurdles with mostly CGA and demonstrated righting reactions to maintain standing balance. Pt cued to step close to hurdle vs attempting to step at distance. Pt progressed to using tidal tank in B UE's and navigating all 6 hurdles. Pt initially required increased time to step over to assess body position in space to hurdle and problem solved by stepping to hurdle until foot touched base. Pt then ambulated around main gym and performed 2nd round of hurdles all with CGA/light minA for hurdles and supervision when ambulating around gym.   NMR performed for improvements in motor control and coordination, balance, sequencing, judgement, and self confidence/ efficacy in performing all aspects of mobility at highest level of independence.   Therapeutic  Exercise: Pt performed the following exercises with therapist providing the described cuing and facilitation for improvement. - NuStep 0.1 miles 40 avg SPM 175 steps hill program 1-4 resistance range in order to increase B LE and cardiovascular endurance. Pt required extended rest break   Patient supine in bed at end of session with brakes locked, family present, and all needs within reach.      Therapy Documentation Precautions:  Precautions Precautions: Fall Recall of Precautions/Restrictions: Impaired Precaution/Restrictions Comments: R inattention Restrictions Weight Bearing Restrictions Per Provider Order: No  Therapy/Group: Individual Therapy  General Wearing PTA 11/23/2024, 12:22 PM

## 2024-11-23 NOTE — Progress Notes (Signed)
 Physical Therapy Discharge Summary  Patient Details  Name: Lavelle Berland MRN: [id number] Date of Birth: [birth year]  Date of Discharge from PT service:{Time; dates multiple:304500300}  {CHL IP REHAB PT TIME CALCULATION:304800500}   Patient has met {NUMBERS 0-12:18577} of {NUMBERS 0-12:18577} long term goals due to {due un:6958322}.  Patient to discharge at [facility] level {LOA:3049010}.   Patient's care partner {care partner:3041650} to provide the necessary {assistance:3041652} assistance at discharge.  Reasons goals not met: ***  Recommendation:  Patient will benefit from ongoing skilled PT services in {setting:3041680} to continue to advance safe functional mobility, address ongoing impairments in ***, and minimize fall risk.  Equipment: {equipment:3041657}  Reasons for discharge: {Reason for discharge:3049018}  Patient/family agrees with progress made and goals achieved: {Pt/Family agree with progress/goals:3049020}  PT Discharge Precautions/Restrictions Precautions Precautions: Fall Restrictions Weight Bearing Restrictions Per Provider Order: No Pain Pain Assessment Pain Scale: 0-10 Pain Score: 5  Pain Location: Foot Pain Intervention(s): Medication (See eMAR) Pain Interference Pain Interference Pain Effect on Sleep: 2. Occasionally Pain Interference with Therapy Activities: 2. Occasionally Pain Interference with Day-to-Day Activities: 1. Rarely or not at all Vision/Perception     Cognition Overall Cognitive Status: Within Functional Limits for tasks assessed Arousal/Alertness: Awake/alert Orientation Level: Oriented X4 Attention: Sustained Sustained Attention: Appears intact Awareness: Impaired Awareness Impairment: Emergent impairment Safety/Judgment: Impaired Sensation Sensation Light Touch: Appears Intact Hot/Cold: Appears Intact Proprioception: Impaired by gross assessment Coordination Gross Motor Movements are Fluid and Coordinated:  No Coordination and Movement Description: Decreased ability to coordinate soft steps Motor  Motor Motor: Within Functional Limits Motor - Discharge Observations: decreased coordination but has improved since evaluation  Mobility Bed Mobility Bed Mobility: Rolling Right;Rolling Left;Sit to Supine;Right Sidelying to Sit Rolling Right: Independent Rolling Left: Independent Right Sidelying to Sit: Independent Supine to Sit: Independent Sit to Supine: Independent Transfers Transfers: Stand Pivot Transfers;Stand to Sit;Sit to Stand Sit to Stand: Independent Stand to Sit: Independent Stand Pivot Transfers: Independent Transfer (Assistive device): None Locomotion  Gait Ambulation: Yes Gait Assistance: Supervision/Verbal cueing Gait Distance (Feet): 875 Feet Assistive device: None Gait Gait: Yes Gait Pattern: Impaired Gait Pattern: Poor foot clearance - left;Poor foot clearance - right;Step-through pattern;Narrow base of support Stairs / Additional Locomotion Stairs: Yes Stairs Assistance: Supervision/Verbal cueing Stair Management Technique: Two rails Number of Stairs: 12 Height of Stairs: 6 Wheelchair Mobility Wheelchair Mobility: No  Trunk/Postural Assessment  Cervical Assessment Cervical Assessment: Within Functional Limits Thoracic Assessment Thoracic Assessment: Within Functional Limits Lumbar Assessment Lumbar Assessment: Within Functional Limits Postural Control Postural Control: Within Functional Limits  Balance   Extremity Assessment  RLE Assessment RLE Assessment: Within Functional Limits LLE Assessment LLE Assessment: Within Functional Limits   Destini Cambre PTA   11/23/2024, 12:52 PM

## 2024-11-23 NOTE — Progress Notes (Addendum)
 "                                                        PROGRESS NOTE   Subjective/Complaints: No new complaints, anxious to get home soon.   ROS: Positives per HPI above. Denies fevers, chills, HA,  abdominal pain, SOB, chest pain, new weakness or paraesthesias.     Objective:   No results found. No results for input(s): WBC, HGB, HCT, PLT in the last 72 hours.  No results for input(s): NA, K, CL, CO2, GLUCOSE, BUN, CREATININE, CALCIUM  in the last 72 hours.   Intake/Output Summary (Last 24 hours) at 11/23/2024 1314 Last data filed at 11/23/2024 0800 Gross per 24 hour  Intake 354 ml  Output --  Net 354 ml        Physical Exam: Vital Signs Blood pressure 115/70, pulse 61, temperature 98.8 F (37.1 C), temperature source Oral, resp. rate 18, height 5' 3 (1.6 m), weight 65.7 kg, SpO2 96%.   General: No acute distress.  Sitting up in bed, appears comfortable Mood and affect are appropriate although mildly anxious  Heart: Regular rate and rhythm no rubs murmurs or extra sounds Lungs: Clear to auscultation, breathing unlabored, no rales or wheezes Abdomen: Positive bowel sounds- a little hypoactive, soft nontender to palpation, nondistended Extremities: No clubbing, cyanosis, or edema Skin: No evidence of breakdown, no evidence of rash  Neurologic: Awake, alert, oriented to self and place, partially to time. Follows all commands appropriately  Cranial nerves II through XII intact, motor strength is 5/5 in Left and 4+/5 R deltoid, bicep, tricep, grip, hip flexor, knee extensors, ankle dorsiflexor and plantar flexor Sensory exam: Light touch sensation intact Cerebellar intact FNF in BUEs   Musculoskeletal: Full range of motion in all 4 extremities. No joint swelling   Physical exam unchanged from the above on reexamination 11/23/24   Assessment/Plan: 1. Functional deficits which require 3+ hours per day of interdisciplinary therapy in a  comprehensive inpatient rehab setting. Physiatrist is providing close team supervision and 24 hour management of active medical problems listed below. Physiatrist and rehab team continue to assess barriers to discharge/monitor patient progress toward functional and medical goals  Care Tool:  Bathing    Body parts bathed by patient: Right arm, Left upper leg, Left arm, Right lower leg, Chest, Abdomen, Left lower leg, Front perineal area, Face, Buttocks, Right upper leg   Body parts bathed by helper: Buttocks     Bathing assist Assist Level: Supervision/Verbal cueing     Upper Body Dressing/Undressing Upper body dressing   What is the patient wearing?: Pull over shirt    Upper body assist Assist Level: Set up assist    Lower Body Dressing/Undressing Lower body dressing      What is the patient wearing?: Pants, Underwear/pull up     Lower body assist Assist for lower body dressing: Supervision/Verbal cueing     Toileting Toileting    Toileting assist Assist for toileting: Supervision/Verbal cueing     Transfers Chair/bed transfer  Transfers assist     Chair/bed transfer assist level: Independent     Locomotion Ambulation   Ambulation assist      Assist level: Supervision/Verbal cueing Assistive device: No Device Max distance: 875   Walk 10 feet activity   Assist  Assist level: Supervision/Verbal cueing Assistive device: No Device   Walk 50 feet activity   Assist    Assist level: Supervision/Verbal cueing Assistive device: No Device    Walk 150 feet activity   Assist    Assist level: Supervision/Verbal cueing Assistive device: No Device    Walk 10 feet on uneven surface  activity   Assist     Assist level: Supervision/Verbal cueing Assistive device: Other (comment) (none)   Wheelchair     Assist Is the patient using a wheelchair?: No Type of Wheelchair: Manual    Wheelchair assist level: Moderate Assistance -  Patient 50 - 74% Max wheelchair distance: 100    Wheelchair 50 feet with 2 turns activity    Assist        Assist Level: Moderate Assistance - Patient 50 - 74%   Wheelchair 150 feet activity     Assist      Assist Level: Maximal Assistance - Patient 25 - 49%   Blood pressure 115/70, pulse 61, temperature 98.8 F (37.1 C), temperature source Oral, resp. rate 18, height 5' 3 (1.6 m), weight 65.7 kg, SpO2 96%.  Medical Problem List and Plan: 1. Functional deficits secondary to left basal ganglia infarction as well as history of CVA 2023 without residual deficits             -patient may   shower             -ELOS/Goals: 1/27 Mod I PT/OT/SLP  -Continue CIR      2.  Antithrombotics: -DVT/anticoagulation:  Pharmaceutical: Lovenox              -antiplatelet therapy: Aspirin  81 mg daily and Plavix  75 mg daily x 3 weeks then aspirin  alone   3. 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 Mood/Behavior/Sleep: Ambien  5 mg nightly.  Provide emotional support             -antipsychotic agents: N/A              - was on ambien  10 mg at bedtime + ?trazodone  100 mg at bedtime at home; add trazodone  50 mg PRN 1-17: Scheduled trazodone  100 mg nightly per patient request--sleep much improved 1-18              5. Neuropsych/cognition: This patient is capable of making decisions on her own behalf. 6.  Skin/Wound Care: Routine skin checks              - R antecubital IV site open w/ erythema - add neosporin 2x daily; monitor closely--appears improved     7. Fluids/Electrolytes/Nutrition: Routine in and outs with follow-up chemistries 8.  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  Follow-up Dr. Ole Holts.  Interrogation of pacemaker showed no A-fib 9.  Hypertension.  Norvasc   5 mg daily.  Monitor with increased mobility  -1/24  BP well-controlled Vitals:   11/22/24 2009 11/23/24 0400  BP: 124/72 115/70  Pulse: 71 61  Resp: 19 18  Temp: 97.8 F (36.6 C) 98.8 F (37.1 C)  SpO2: 97% 96%    10.  Hyperlipidemia.  Crestor  11.  GERD.  Pepcid  12.  Constipation.  BM x 3 on 1/19  - 1-18: Add Senokot-S 1 tab twice daily, MiraLAX  daily, and as needed sorbitol .    -1/24 sorbitol  30ml ordered      LOS: 9 days A FACE TO FACE EVALUATION WAS PERFORMED  Murray Collier 11/23/2024, 1:14 PM     "

## 2024-11-24 NOTE — Progress Notes (Addendum)
 "                                                        PROGRESS NOTE   Subjective/Complaints: Patient has not had bowel movement in few days.  Patient did not take sorbitol  yesterday, she wanted to wait till today and took it this morning.  No additional concerns or complaints.  ROS: Positives per HPI above. Denies fevers, chills, HA,  abdominal pain, SOB, chest pain, new weakness or paraesthesias.   Denies new motor or sensory changes  Objective:   No results found. No results for input(s): WBC, HGB, HCT, PLT in the last 72 hours.  No results for input(s): NA, K, CL, CO2, GLUCOSE, BUN, CREATININE, CALCIUM  in the last 72 hours.   Intake/Output Summary (Last 24 hours) at 11/24/2024 1302 Last data filed at 11/24/2024 0745 Gross per 24 hour  Intake 773 ml  Output --  Net 773 ml        Physical Exam: Vital Signs Blood pressure 104/60, pulse 65, temperature 98.4 F (36.9 C), temperature source Oral, resp. rate 19, height 5' 3 (1.6 m), weight 65.7 kg, SpO2 98%.   General: No acute distress.  Sitting up in bed, appears comfortable Psych: Appropriate and cooperative Heart: Regular rate and rhythm no rubs murmurs or extra sounds Lungs: Clear to auscultation, breathing unlabored, no rales or wheezes Abdomen: Positive bowel sounds, soft nontender to palpation, nondistended Extremities: No clubbing, cyanosis, or edema Skin: No evidence of breakdown, no evidence of rash  Neurologic: Awake, alert, oriented to self and place, partially to time. Follows all commands appropriately  Cranial nerves II through XII intact, motor strength is 5/5 in Left and 4+/5 R deltoid, bicep, tricep, grip, hip flexor, knee extensors, ankle dorsiflexor and plantar flexor Sensory exam: Light touch sensation intact Cerebellar intact FNF in BUEs   Musculoskeletal: Full range of motion in all 4 extremities. No joint swelling   Physical exam unchanged from the above on reexamination  11/24/24   Assessment/Plan: 1. Functional deficits which require 3+ hours per day of interdisciplinary therapy in a comprehensive inpatient rehab setting. Physiatrist is providing close team supervision and 24 hour management of active medical problems listed below. Physiatrist and rehab team continue to assess barriers to discharge/monitor patient progress toward functional and medical goals  Care Tool:  Bathing    Body parts bathed by patient: Right arm, Left upper leg, Left arm, Right lower leg, Chest, Abdomen, Left lower leg, Front perineal area, Face, Buttocks, Right upper leg   Body parts bathed by helper: Buttocks     Bathing assist Assist Level: Supervision/Verbal cueing     Upper Body Dressing/Undressing Upper body dressing   What is the patient wearing?: Pull over shirt    Upper body assist Assist Level: Set up assist    Lower Body Dressing/Undressing Lower body dressing      What is the patient wearing?: Pants, Underwear/pull up     Lower body assist Assist for lower body dressing: Supervision/Verbal cueing     Toileting Toileting    Toileting assist Assist for toileting: Supervision/Verbal cueing     Transfers Chair/bed transfer  Transfers assist     Chair/bed transfer assist level: Independent     Locomotion Ambulation   Ambulation assist      Assist level: Supervision/Verbal  cueing Assistive device: No Device Max distance: 875   Walk 10 feet activity   Assist     Assist level: Supervision/Verbal cueing Assistive device: No Device   Walk 50 feet activity   Assist    Assist level: Supervision/Verbal cueing Assistive device: No Device    Walk 150 feet activity   Assist    Assist level: Supervision/Verbal cueing Assistive device: No Device    Walk 10 feet on uneven surface  activity   Assist     Assist level: Supervision/Verbal cueing Assistive device: Other (comment) (none)   Wheelchair     Assist Is the  patient using a wheelchair?: No Type of Wheelchair: Manual    Wheelchair assist level: Moderate Assistance - Patient 50 - 74% Max wheelchair distance: 100    Wheelchair 50 feet with 2 turns activity    Assist        Assist Level: Moderate Assistance - Patient 50 - 74%   Wheelchair 150 feet activity     Assist      Assist Level: Maximal Assistance - Patient 25 - 49%   Blood pressure 104/60, pulse 65, temperature 98.4 F (36.9 C), temperature source Oral, resp. rate 19, height 5' 3 (1.6 m), weight 65.7 kg, SpO2 98%.  Medical Problem List and Plan: 1. Functional deficits secondary to left basal ganglia infarction as well as history of CVA 2023 without residual deficits             -patient may   shower             -ELOS/Goals: 1/27 Mod I PT/OT/SLP  -Continue CIR      2.  Antithrombotics: -DVT/anticoagulation:  Pharmaceutical: Lovenox              -antiplatelet therapy: Aspirin  81 mg daily and Plavix  75 mg daily x 3 weeks then aspirin  alone   3. 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 Mood/Behavior/Sleep: Ambien  5 mg nightly.  Provide emotional support             -antipsychotic agents: N/A              - was on ambien  10 mg at bedtime + ?trazodone  100 mg at bedtime at home; add trazodone  50 mg PRN 1-17: Scheduled trazodone  100 mg nightly per patient request--sleep much improved  1-18              5. Neuropsych/cognition: This patient is capable of making decisions on her own behalf. 6. Skin/Wound Care: Routine skin checks              - R antecubital IV site open w/ erythema - add neosporin 2x daily; monitor closely--appears improved     7. Fluids/Electrolytes/Nutrition: Routine in and outs with follow-up chemistries 8.  Type II Mobitz  status post PPM 04/05/2024.  Follow-up Dr. Ole Holts.  Interrogation of pacemaker showed no A-fib 9.  Hypertension.  Norvasc  5 mg daily.  Monitor with increased mobility  -1/24-25  BP well-controlled Vitals:   11/23/24 1931 11/24/24 0501  BP: 125/83 104/60  Pulse: 66 65  Resp: 18 19  Temp: 97.8 F (36.6 C) 98.4 F (36.9 C)  SpO2: 96% 98%    10.  Hyperlipidemia.  Crestor  11.  GERD.  Pepcid  12.  Constipation.  BM x 3 on 1/19  - 1-18: Add Senokot-S 1 tab twice daily, MiraLAX  daily, and as needed sorbitol .    -1/24 sorbitol  30ml ordered  -1/25 patient did not want to take sorbitol  30mg  last night but took it this morning.  Will monitor for results also she has as needed sorbitol  and can get another dose later if needed.      LOS: 10 days A FACE TO FACE EVALUATION WAS PERFORMED  Murray Collier 11/24/2024, 1:02 PM     "

## 2024-11-25 ENCOUNTER — Other Ambulatory Visit (HOSPITAL_COMMUNITY): Payer: Self-pay

## 2024-11-25 LAB — BASIC METABOLIC PANEL WITH GFR
Anion gap: 9 (ref 5–15)
BUN: 21 mg/dL (ref 8–23)
CO2: 26 mmol/L (ref 22–32)
Calcium: 9.2 mg/dL (ref 8.9–10.3)
Chloride: 104 mmol/L (ref 98–111)
Creatinine, Ser: 1.03 mg/dL — ABNORMAL HIGH (ref 0.44–1.00)
GFR, Estimated: 55 mL/min — ABNORMAL LOW
Glucose, Bld: 87 mg/dL (ref 70–99)
Potassium: 4.2 mmol/L (ref 3.5–5.1)
Sodium: 139 mmol/L (ref 135–145)

## 2024-11-25 LAB — CBC
HCT: 34.7 % — ABNORMAL LOW (ref 36.0–46.0)
Hemoglobin: 11.8 g/dL — ABNORMAL LOW (ref 12.0–15.0)
MCH: 29.8 pg (ref 26.0–34.0)
MCHC: 34 g/dL (ref 30.0–36.0)
MCV: 87.6 fL (ref 80.0–100.0)
Platelets: 183 10*3/uL (ref 150–400)
RBC: 3.96 MIL/uL (ref 3.87–5.11)
RDW: 13.2 % (ref 11.5–15.5)
WBC: 4.1 10*3/uL (ref 4.0–10.5)
nRBC: 0 % (ref 0.0–0.2)

## 2024-11-25 MED ORDER — AMLODIPINE BESYLATE 5 MG PO TABS
5.0000 mg | ORAL_TABLET | Freq: Every day | ORAL | 0 refills | Status: AC
  Filled 2024-11-25: qty 30, 30d supply, fill #0

## 2024-11-25 MED ORDER — ROSUVASTATIN CALCIUM 40 MG PO TABS
40.0000 mg | ORAL_TABLET | Freq: Every day | ORAL | 0 refills | Status: AC
  Filled 2024-11-25: qty 30, 30d supply, fill #0

## 2024-11-25 MED ORDER — POLYVINYL ALCOHOL 1.4 % OP SOLN: 1.0000 [drp] | OPHTHALMIC | Status: AC | PRN

## 2024-11-25 MED ORDER — FAMOTIDINE 20 MG PO TABS
20.0000 mg | ORAL_TABLET | Freq: Every day | ORAL | 0 refills | Status: AC
  Filled 2024-11-25: qty 30, 30d supply, fill #0

## 2024-11-25 MED ORDER — GABAPENTIN 300 MG PO CAPS
300.0000 mg | ORAL_CAPSULE | Freq: Three times a day (TID) | ORAL | 0 refills | Status: AC
  Filled 2024-11-25: qty 90, 30d supply, fill #0

## 2024-11-25 MED ORDER — DULOXETINE HCL 20 MG PO CPEP
20.0000 mg | ORAL_CAPSULE | Freq: Every day | ORAL | 0 refills | Status: AC
  Filled 2024-11-25: qty 30, 30d supply, fill #0

## 2024-11-25 MED ORDER — CLOPIDOGREL BISULFATE 75 MG PO TABS
75.0000 mg | ORAL_TABLET | Freq: Every day | ORAL | 0 refills | Status: AC
  Filled 2024-11-25: qty 5, 5d supply, fill #0

## 2024-11-25 MED ORDER — HYDROCODONE-ACETAMINOPHEN 5-325 MG PO TABS
1.0000 | ORAL_TABLET | Freq: Two times a day (BID) | ORAL | 0 refills | Status: AC | PRN
  Filled 2024-11-25: qty 30, 15d supply, fill #0

## 2024-11-25 MED ORDER — ZOLPIDEM TARTRATE 5 MG PO TABS
5.0000 mg | ORAL_TABLET | Freq: Every day | ORAL | 0 refills | Status: AC
  Filled 2024-11-25: qty 60, 60d supply, fill #0

## 2024-11-25 MED ORDER — TRAZODONE HCL 100 MG PO TABS
100.0000 mg | ORAL_TABLET | Freq: Every day | ORAL | 0 refills | Status: AC
  Filled 2024-11-25: qty 30, 30d supply, fill #0

## 2024-11-25 NOTE — Plan of Care (Signed)
  Problem: RH Balance Goal: LTG Patient will maintain dynamic standing with ADLs (OT) Description: LTG:  Patient will maintain dynamic standing balance with assist during activities of daily living (OT)  Outcome: Completed/Met   Problem: Sit to Stand Goal: LTG:  Patient will perform sit to stand in prep for activites of daily living with assistance level (OT) Description: LTG:  Patient will perform sit to stand in prep for activites of daily living with assistance level (OT) Outcome: Completed/Met   Problem: RH Grooming Goal: LTG Patient will perform grooming w/assist,cues/equip (OT) Description: LTG: Patient will perform grooming with assist, with/without cues using equipment (OT) Outcome: Completed/Met   Problem: RH Bathing Goal: LTG Patient will bathe all body parts with assist levels (OT) Description: LTG: Patient will bathe all body parts with assist levels (OT) Outcome: Completed/Met   Problem: RH Dressing Goal: LTG Patient will perform upper body dressing (OT) Description: LTG Patient will perform upper body dressing with assist, with/without cues (OT). Outcome: Completed/Met Goal: LTG Patient will perform lower body dressing w/assist (OT) Description: LTG: Patient will perform lower body dressing with assist, with/without cues in positioning using equipment (OT) Outcome: Completed/Met   Problem: RH Toileting Goal: LTG Patient will perform toileting task (3/3 steps) with assistance level (OT) Description: LTG: Patient will perform toileting task (3/3 steps) with assistance level (OT)  Outcome: Completed/Met   Problem: RH Light Housekeeping Goal: LTG Patient will perform light housekeeping w/assist (OT) Description: LTG: Patient will perform light housekeeping with assistance, with/without cues (OT). Outcome: Completed/Met   Problem: RH Toilet Transfers Goal: LTG Patient will perform toilet transfers w/assist (OT) Description: LTG: Patient will perform toilet transfers  with assist, with/without cues using equipment (OT) Outcome: Completed/Met   Problem: RH Tub/Shower Transfers Goal: LTG Patient will perform tub/shower transfers w/assist (OT) Description: LTG: Patient will perform tub/shower transfers with assist, with/without cues using equipment (OT) Outcome: Completed/Met

## 2024-11-25 NOTE — Progress Notes (Signed)
 Up 2 times without assistance. Reminded to call. Bed alarm in use to alert staff and remind patient not to get up without assistance. Tandrea Kommer A

## 2024-11-25 NOTE — Progress Notes (Signed)
 Occupational Therapy Discharge Summary  Patient Details  Name: Kayla Terry MRN: [id number] Date of Birth: [birth year]  Date of Discharge from OT service:November 25, 2024    Patient has met 10 of 10 long term goals due to improved activity tolerance, improved balance, ability to compensate for deficits, functional use of  RIGHT upper extremity, improved attention, improved awareness, and improved coordination.  Patient to discharge at overall Supervision level for showering, step in shower transfer and homemaking, but she is safe to use the toilet and dress without supervision, safe to ambulate in room without Supervision.  Patient's care partner is independent to provide the necessary physical and cognitive assistance at discharge.    Reasons goals not met: n/a  Recommendation:  Patient will benefit from ongoing skilled OT services in home health setting to continue to advance functional skills in the area of BADL.  Equipment: No equipment provided (pt already had needed DME)  Reasons for discharge: treatment goals met  Patient/family agrees with progress made and goals achieved: Yes  OT Discharge Precautions/Restrictions  Precautions Precautions: Fall Precaution/Restrictions Comments: mild R inattention Restrictions Weight Bearing Restrictions Per Provider Order: No     ADL ADL Eating: Independent Grooming: Independent Where Assessed-Grooming: Standing at sink Upper Body Bathing: Supervision/safety Where Assessed-Upper Body Bathing: Shower Lower Body Bathing: Supervision/safety Where Assessed-Lower Body Bathing: Shower Upper Body Dressing: Independent Where Assessed-Upper Body Dressing: Edge of bed Lower Body Dressing: Modified independent Where Assessed-Lower Body Dressing: Edge of bed Toileting: Independent Where Assessed-Toileting: Teacher, Adult Education: Community Education Officer Method: Proofreader: Multimedia Programmer: Distant supervision Film/video Editor Method: Designer, Industrial/product: Grab bars Vision Baseline Vision/History: 0 No visual deficits;1 Wears glasses Patient Visual Report: No change from baseline Eye Alignment: Within Functional Limits Ocular Range of Motion: Within Functional Limits Alignment/Gaze Preference: Within Defined Limits Tracking/Visual Pursuits: Able to track stimulus in all quads without difficulty Visual Fields: No apparent deficits Perception  Perception-Other Comments: mild R inattention but has improved significantly since admission. Pt is safe to ambulate in room to use the restroom without Supervision.  No inattention noted during self care but occasionally with ambulation in crowded environments. Praxis Praxis: WFL Cognition Cognition Overall Cognitive Status: Impaired/Different from baseline Arousal/Alertness: Awake/alert Memory: Impaired Memory Impairment: Storage deficit;Retrieval deficit Attention: Sustained Sustained Attention: Appears intact Awareness: Impaired Awareness Impairment: Emergent impairment Problem Solving: Impaired Problem Solving Impairment: Verbal complex;Functional complex Executive Function: Sequencing;Organizing Sequencing: Impaired Sequencing Impairment: Verbal complex;Functional complex Organizing: Impaired Organizing Impairment: Verbal complex;Functional complex Safety/Judgment: Impaired Brief Interview for Mental Status (BIMS) Repetition of Three Words (First Attempt): 3 Temporal Orientation: Year: Correct Temporal Orientation: Month: Accurate within 5 days Temporal Orientation: Day: Correct Recall: Sock: Yes, no cue required Recall: Blue: Yes, no cue required Recall: Bed: Yes, no cue required BIMS Summary Score: 15 Sensation Sensation Light Touch: Appears Intact Hot/Cold: Appears Intact Proprioception: Appears Intact Motor  Motor Motor - Discharge Observations: mildly decreased  coordination on R side, but has improved since evaluation     Trunk/Postural Assessment  Postural Control Postural Control: Within Functional Limits  Balance Static Sitting Balance Static Sitting - Level of Assistance: 7: Independent Dynamic Sitting Balance Dynamic Sitting - Level of Assistance: 7: Independent Static Standing Balance Static Standing - Level of Assistance: 7: Independent Dynamic Standing Balance Dynamic Standing - Level of Assistance: 7: Independent Extremity/Trunk Assessment RUE Assessment General Strength Comments: 4+/5 elbow and hand,   4/5 shoulder flex and abd (improved from admission) LUE Assessment General  Strength Comments: 4+/5   Briany Aye 11/25/2024, 10:02 AM

## 2024-11-25 NOTE — Progress Notes (Signed)
 Physical Therapy Session Note  Patient Details  Name: Kayla Terry MRN: [id number] Date of Birth: 11-16-1945  Today's Date: 11/25/2024 PT Individual Time: 1302-1358 PT Individual Time Calculation (min): 56 min   Short Term Goals: Week 1:  PT Short Term Goal 1 (Week 1): STG = LTG 2/2 ELOS PT Short Term Goal 1 - Progress (Week 1): Progressing toward goal Week 2:  PT Short Term Goal 1 (Week 2): STG = LTG d/t ELOS  Skilled Therapeutic Interventions/Progress Updates:  PT instructed pt in Grad day assessment to measure progress toward goals. See below for details. CARETool mobility assessment  also completed; see CAREtool tab in navigator for details.  Patient seated upright in recliner with BLE elevated on bed on entrance to room. Patient alert and agreeable to PT session. OT has made pt Mod I in room.   Patient with no pain complaint at start of session.  Therapeutic Activity: Bed Mobility: Pt performed supine <> sit with Mod I. No cueing required. Transfers: Pt performed sit<>stand and stand pivot transfers throughout session with IND. No cueing required.  Gait Training:  Pt ambulated >200 ft using no AD with Mod I requiring extra time d/t decreased gait speed. Does not require AD to complete. Decreased hip/ knee flexion for decreased but adequate step height.   Neuromuscular Re-ed: NMR facilitated during session with focus on standing balance. Pt guided in re-assessment of Berg Balance Test and Timed Up and Go test. Results below. Pt scored same on Berg as compared to scores from previous assessment in previous week. Demos slight improvement in reassessment of TUG. Overall demos reduced risk of falls as compared to [facility].   NMR performed for improvements in motor control and coordination, balance, sequencing, judgement, and self confidence/ efficacy in performing all aspects of mobility at highest level of independence.   Patient supine in bed at end of session with brakes locked, no  alarm set, and all needs within reach.   Therapy Documentation Precautions:  Precautions Precautions: Fall Recall of Precautions/Restrictions: Impaired Precaution/Restrictions Comments: mild R inattention Restrictions Weight Bearing Restrictions Per Provider Order: No  Pain: No pain indicated this session.   Balance: Balance Balance Assessed: Yes Standardized Balance Assessment Standardized Balance Assessment: Berg Balance Test Berg Balance Test Sit to Stand: Able to stand without using hands and stabilize independently Standing Unsupported: Able to stand safely 2 minutes Sitting with Back Unsupported but Feet Supported on Floor or Stool: Able to sit safely and securely 2 minutes Stand to Sit: Sits safely with minimal use of hands Transfers: Able to transfer safely, minor use of hands Standing Unsupported with Eyes Closed: Able to stand 10 seconds safely Standing Ubsupported with Feet Together: Able to place feet together independently and stand for 1 minute with supervision From Standing, Reach Forward with Outstretched Arm: Can reach forward >12 cm safely (5) From Standing Position, Pick up Object from Floor: Able to pick up shoe safely and easily From Standing Position, Turn to Look Behind Over each Shoulder: Looks behind one side only/other side shows less weight shift Turn 360 Degrees: Able to turn 360 degrees safely but slowly Standing Unsupported, Alternately Place Feet on Step/Stool: Able to complete >2 steps/needs minimal assist Standing Unsupported, One Foot in Front: Able to take small step independently and hold 30 seconds Standing on One Leg: Tries to lift leg/unable to hold 3 seconds but remains standing independently Total Score: 43 Timed Up and Go Test TUG: Normal TUG Normal TUG (seconds): 16.44   Therapy/Group: Individual  Therapy  Mliss DELENA Milliner PT, DPT, CSRS 11/25/2024, 5:39 PM

## 2024-11-25 NOTE — Progress Notes (Signed)
 Physical Therapy Note  Patient Details  Name: Kayla Terry MRN: [id number] Date of Birth: 07-19-1946 Today's Date: 11/25/2024    Due to the [place]  state of emergency, patients may not receive 3.0 hours of therapy services.    [name] 11/25/2024, 11:01 AM

## 2024-11-25 NOTE — Plan of Care (Signed)
" °  Problem: RH Comprehension Communication Goal: LTG Patient will comprehend basic/complex auditory (SLP) Description: LTG: Patient will comprehend basic/complex auditory information with cues (SLP). Outcome: Completed/Met   Problem: RH Expression Communication Goal: LTG Patient will increase word finding of common (SLP) Description: LTG:  Patient will increase word finding of common objects/daily info/abstract thoughts with cues using compensatory strategies (SLP). Outcome: Completed/Met   Problem: RH Problem Solving Goal: LTG Patient will demonstrate problem solving for (SLP) Description: LTG:  Patient will demonstrate problem solving for basic/complex daily situations with cues  (SLP) Outcome: Completed/Met   "

## 2024-11-25 NOTE — Progress Notes (Signed)
 Inpatient Rehabilitation Discharge Medication Review by a Pharmacist  A complete drug regimen review was completed for this patient to identify any potential clinically significant medication issues.  High Risk Drug Classes Is patient taking? Indication by Medication  Antipsychotic No   Anticoagulant No   Antibiotic No   Opioid Yesnorco- acute pain   Antiplatelet Yesasa, plavix - cva ppx   Hypoglycemics/insulin No   Vasoactive Medication Yesnorvasc- HTN   Chemotherapy No   Other Yescymbalta, gabapentin - neuropathic pain Pepcid - heart burn Crestor - HLD Trazodone - sleep Ambien - sleep      Type of Medication Issue Identified Description of Issue Recommendation(s)  Drug Interaction(s) (clinically significant)     Duplicate Therapy     Allergy     No Medication Administration End Date     Incorrect Dose     Additional Drug Therapy Needed     Significant med changes from prior encounter (inform family/care partners about these prior to discharge).    Other       Clinically significant medication issues were identified that warrant physician communication and completion of prescribed/recommended actions by midnight of the next day:  No   Time spent performing this drug regimen review (minutes):  30   Reagyn Facemire BS, PharmD, BCPS Clinical Pharmacist 11/25/2024 8:18 AM  Contact: [phone] after 3 PM

## 2024-11-25 NOTE — Progress Notes (Signed)
 Speech Language Pathology Discharge Summary  Patient Details  Name: Kayla Terry MRN: [id number] Date of Birth: 12-Sep-1946  Date of Discharge from SLP service:November 25, 2024  Today's Date: 11/25/2024 SLP Individual Time: 1500-1557 SLP Individual Time Calculation (min): 57 min   Skilled Therapeutic Interventions:  SLP conducted skilled therapy session targeting cognitive-linguistic goals. Of note, originally scheduled for family education, however no family present during session. Patient completed mildly complex word categorization task where patient utilized list of words and placed them in pre-designated categories with supervision. During conversation re: discharge, she benefited from supervision assist for word finding to promote ongoing flow of conversation and to convey communicative intent smoothly. Several instance of specific word finding deficits persist, however patient does compensate via circumlocution, gesturing, and context clues. In final minutes of session, discussed plans for discharge with patient benefiting from supervision for communicative content. Patient was left in room with call bell in reach and alarm set. Patient is at goal level and adequate for discharge, see full summary below for details.   Patient has met 3 of 3 long term goals.  Patient to discharge at overall Supervision level.  Reasons goals not met: n/a   Clinical Impression/Discharge Summary:   Patient has made excellent progress during therapy admission and has met all long term goals set for duration. Patient currently benefits from supervision assist for problem solving, supervision for comprehension of mildly complex auditory information, and min assist for mildly complex word finding. Patient would benefit from ongoing SLP services at next venue of care to target all lingering deficits. Patient would also benefit from supervision from family during completion of cognitive tasks and for assistance with  communication of mildly complex wants/needs. Patient and family education complete. SLP will sign off.   Care Partner:  Caregiver Able to Provide Assistance: Yes  Type of Caregiver Assistance: Cognitive  Recommendation:  Outpatient SLP  Rationale for SLP Follow Up: Maximize functional communication;Maximize cognitive function and independence   Equipment: n/a   Reasons for discharge: Treatment goals met;Discharged from hospital   Patient/Family Agrees with Progress Made and Goals Achieved: Yes   Rosina Downy, M.A., CCC-SLP  Kyrie Bun A Gianne Shugars 11/25/2024, 9:48 AM

## 2024-11-25 NOTE — Progress Notes (Addendum)
 "                                                        PROGRESS NOTE   Subjective/Complaints: Per OT ok to be up ad lib in room  .   No falls   ROS: Positives per HPI above. Denies fevers, chills, HA,  abdominal pain, SOB, chest pain, new weakness or paraesthesias.   Denies new motor or sensory changes  Objective:   No results found. Recent Labs    11/25/24 0454  WBC 4.1  HGB 11.8*  HCT 34.7*  PLT 183    Recent Labs    11/25/24 0454  NA 139  K 4.2  CL 104  CO2 26  GLUCOSE 87  BUN 21  CREATININE 1.03*  CALCIUM  9.2     Intake/Output Summary (Last 24 hours) at 11/25/2024 0939 Last data filed at 11/25/2024 0737 Gross per 24 hour  Intake 956 ml  Output --  Net 956 ml        Physical Exam: Vital Signs Blood pressure 121/79, pulse 84, temperature 97.8 F (36.6 C), temperature source Oral, resp. rate 18, height 5' 3 (1.6 m), weight 65.7 kg, SpO2 95%.   General: No acute distress.  Sitting up in bed, appears comfortable Psych: Appropriate and cooperative Heart: Regular rate and rhythm no rubs murmurs or extra sounds Lungs: Clear to auscultation, breathing unlabored, no rales or wheezes Abdomen: Positive bowel sounds, soft nontender to palpation, nondistended Extremities: No clubbing, cyanosis, or edema Skin: No evidence of breakdown, no evidence of rash  Neurologic: Awake, alert, oriented to self and place, partially to time. Follows all commands appropriately  Cranial nerves II through XII intact, motor strength is 5/5 in Left and 4+/5 R deltoid, bicep, tricep, grip, hip flexor, knee extensors, ankle dorsiflexor and plantar flexor Sensory exam: Light touch sensation intact Cerebellar intact FNF in BUEs   Musculoskeletal: Full range of motion in all 4 extremities. No joint swelling   Physical exam unchanged from the above on reexamination 11/25/24   Assessment/Plan: 1. Functional deficits which require 3+ hours per day of interdisciplinary therapy in a  comprehensive inpatient rehab setting. Physiatrist is providing close team supervision and 24 hour management of active medical problems listed below. Physiatrist and rehab team continue to assess barriers to discharge/monitor patient progress toward functional and medical goals  Care Tool:  Bathing    Body parts bathed by patient: Right arm, Left upper leg, Left arm, Right lower leg, Chest, Abdomen, Left lower leg, Front perineal area, Face, Buttocks, Right upper leg   Body parts bathed by helper: Buttocks     Bathing assist Assist Level: Supervision/Verbal cueing     Upper Body Dressing/Undressing Upper body dressing   What is the patient wearing?: Pull over shirt    Upper body assist Assist Level: Set up assist    Lower Body Dressing/Undressing Lower body dressing      What is the patient wearing?: Pants, Underwear/pull up     Lower body assist Assist for lower body dressing: Supervision/Verbal cueing     Toileting Toileting    Toileting assist Assist for toileting: Supervision/Verbal cueing     Transfers Chair/bed transfer  Transfers assist     Chair/bed transfer assist level: Independent     Locomotion Ambulation   Ambulation assist  Assist level: Supervision/Verbal cueing Assistive device: No Device Max distance: 875   Walk 10 feet activity   Assist     Assist level: Supervision/Verbal cueing Assistive device: No Device   Walk 50 feet activity   Assist    Assist level: Supervision/Verbal cueing Assistive device: No Device    Walk 150 feet activity   Assist    Assist level: Supervision/Verbal cueing Assistive device: No Device    Walk 10 feet on uneven surface  activity   Assist     Assist level: Supervision/Verbal cueing Assistive device: Other (comment) (none)   Wheelchair     Assist Is the patient using a wheelchair?: No Type of Wheelchair: Manual    Wheelchair assist level: Moderate Assistance -  Patient 50 - 74% Max wheelchair distance: 100    Wheelchair 50 feet with 2 turns activity    Assist        Assist Level: Moderate Assistance - Patient 50 - 74%   Wheelchair 150 feet activity     Assist      Assist Level: Maximal Assistance - Patient 25 - 49%   Blood pressure 121/79, pulse 84, temperature 97.8 F (36.6 C), temperature source Oral, resp. rate 18, height 5' 3 (1.6 m), weight 65.7 kg, SpO2 95%.  Medical Problem List and Plan: 1. Functional deficits secondary to left basal ganglia infarction as well as history of CVA 2023 without residual deficits             -patient may   shower             -ELOS/Goals: 1/27 Mod I PT/OT/SLP  -Continue CIR      2.  Antithrombotics: -DVT/anticoagulation:  Pharmaceutical: Lovenox - may d/c due to ambulation distance             -antiplatelet therapy: Aspirin  81 mg daily and Plavix  75 mg daily x 3 weeks then aspirin  alone   3. 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 Mood/Behavior/Sleep: Ambien  5 mg nightly.  Provide emotional support             -antipsychotic agents: N/A              - was on ambien  10 mg at bedtime + ?trazodone  100 mg at bedtime at home; add trazodone  50 mg PRN 1-17: Scheduled trazodone  100 mg nightly per patient request--sleep much improved 1-18 Zolpidem  5mg  at bedtime, prescribed by PCP , pt would like 1 wk supply at discharge which is ok               5. Neuropsych/cognition: This patient is capable of making decisions on her own behalf. 6. Skin/Wound Care: Routine skin  checks              - R antecubital IV site open w/ erythema - add neosporin 2x daily; monitor closely--appears improved     7. Fluids/Electrolytes/Nutrition: Routine in and outs  with follow-up chemistries 8.  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  Follow-up Dr. Ole Holts.  Interrogation of pacemaker showed no A-fib 9.  Hypertension.  Norvasc  5 mg daily.  Monitor with increased mobility  -1/24-25  BP well-controlled Vitals:   11/24/24 1944 11/25/24 0457  BP: (!) 147/71 121/79  Pulse: 64 84  Resp: 18 18  Temp: 97.8 F (36.6 C) 97.8 F (36.6 C)  SpO2: 94% 95%    10.  Hyperlipidemia.  Crestor  11.  GERD.  Pepcid  12.  Constipation.    - 1-18: Add Senokot-S 1 tab twice daily, MiraLAX  daily, and as needed sorbitol  (last dose 1/25).    Last BM 1/25-continent type 4      LOS: 11 days A FACE TO FACE EVALUATION WAS PERFORMED  Prentice FORBES Compton 11/25/2024, 9:39 AM     "

## 2024-11-25 NOTE — Progress Notes (Signed)
 Occupational Therapy Session Note  Patient Details  Name: Kayla Terry MRN: [id number] Date of Birth: 04/06/1946  Today's Date: 11/25/2024 OT Individual Time: 0900-1010 OT Individual Time Calculation (min): 70 min    Short Term Goals: Week 1:  OT Short Term Goal 1 (Week 1): STG=LTG d/t ELOS OT Short Term Goal 1 - Progress (Week 1): Progressing toward goal Week 2:  OT Short Term Goal 1 (Week 2): STGs  = LTGs  Skilled Therapeutic Interventions/Progress Updates:      Pt seen for BADL retraining of toileting, bathing, and dressing with a focus on balance, R side coordination and R side attention.   Showered, toileted, dressed demonstrating excellent improvement from admission.  See ADL documentation below.  She ambulated around room to gather her own clothing from drawers and place dirty clothing in laundry bag.   Worked on making bed, with multiple blankets and tucking blankets in to mattress and folding large blankets.   Ambulated from room around day room loop (at least 150 ft) with no signs of R inattention and distant supervision.    Used toilet 2nd time.   Completed discharge vision, strength and sensory testing.  Reviewed her pt education notebook and showed her the File of Life card and I encouraged her to have her daughter assist with filling that out.  Spoke with pt's nursing and they are in agreement pt is safe to now be independent in the room.  Educated pt that she may now move about her room, walk to the bathroom but to call for A if she is not feeling well.  Pt in room with all needs met.  Therapy Documentation Precautions:  Precautions Precautions: Fall Recall of Precautions/Restrictions: Impaired Precaution/Restrictions Comments: mild R inattention Restrictions Weight Bearing Restrictions Per Provider Order: No      Pain:  4/10 in B feet, neuropathic pain.  Pt applied lidocane cream to feet  ADL: ADL Eating: Independent Grooming: Independent Where  Assessed-Grooming: Standing at sink Upper Body Bathing: Supervision/safety Where Assessed-Upper Body Bathing: Shower Lower Body Bathing: Supervision/safety Where Assessed-Lower Body Bathing: Shower Upper Body Dressing: Independent Where Assessed-Upper Body Dressing: Edge of bed Lower Body Dressing: Modified independent Where Assessed-Lower Body Dressing: Edge of bed Toileting: Independent Where Assessed-Toileting: Teacher, Adult Education: Community Education Officer Method: Proofreader: Engineer, Manufacturing Systems Transfer: Distant supervision Film/video Editor Method: Designer, Industrial/product: Grab bars   Therapy/Group: Individual Therapy  Shakeia Krus 11/25/2024, 10:06 AM

## 2024-11-26 ENCOUNTER — Other Ambulatory Visit (HOSPITAL_COMMUNITY): Payer: Self-pay

## 2024-11-26 ENCOUNTER — Telehealth: Payer: Self-pay | Admitting: Student

## 2024-11-26 NOTE — Telephone Encounter (Signed)
 Attempted phone call to pt's daughter,DPR.  OK per EPIC to leave detailed message.  Advised unable to locate any documentation that our office had attempted to contact pt.  May contact office at [phone] with any further questions.

## 2024-11-26 NOTE — Telephone Encounter (Signed)
 Patient's daughter says she is returning a call, but no one left a message. Call went to patient's home phone, but she is not currently home. She was just discharged from the hospital and is at [facility] house.

## 2024-11-26 NOTE — Progress Notes (Signed)
 Inpatient Rehabilitation Care Coordinator Discharge Note   Patient Details  Name: Kayla Terry MRN: [id number] Date of Birth: 12-24-1945   Discharge location: HOME ALONE WIHT DAUGHTER STAYING WITH HER AND HER SON ASSISTING ALSO  Length of Stay: 11 DAYS  Discharge activity level: SUPERVISION-MOD/I LEVEL  Home/community participation: ACTIVE  Patient response un:Yzjouy Literacy - How often do you need to have someone help you when you read instructions, pamphlets, or other written material from your doctor or pharmacy?: Never  Patient response un:Dnrpjo Isolation - How often do you feel lonely or isolated from those around you?: Never  Services provided included: MD, RD, PT, OT, SLP, RN, CM, TR, Pharmacy, SW  Financial Services:  Field Seismologist Utilized: Marine Scientist MEDICARE  Choices offered to/list presented to: PT AND SISTER  Follow-up services arranged:  Home Health, Patient/Family has no preference for HH/DME agencies Home Health Agency: [facility]  PT  OT  SP     HAS ALL DME FROM PREVIOUS ADMISSIONS    Patient response to transportation need: Is the patient able to respond to transportation needs?: Yes In the past 12 months, has lack of transportation kept you from medical appointments or from getting medications?: No In the past 12 months, has lack of transportation kept you from meetings, work, or from getting things needed for daily living?: No   Patient/Family verbalized understanding of follow-up arrangements:  Yes  Individual responsible for coordination of the follow-up plan: [facility] [phone]  Confirmed correct DME delivered: Raymonde Asberry MATSU 11/26/2024    Comments (or additional information):DAUGHTER AWARE OF PT;S CARE NEEDS AND TEAM'S RECOMMENDATION OF 24/7 SUPERVISION DUE TO SPEECH DEFICITS FOR SAFETY  Summary of Stay    Date/Time Discharge Planning CSW  11/20/24 [phone] Home with daughter who can stay at night and will work  out if needed at during the day, which will need to due to supervision level goals set. Await team's recommendations BGD       Donie Lemelin G

## 2024-11-26 NOTE — Plan of Care (Signed)
" °  Problem: RH Balance Goal: LTG Patient will maintain dynamic sitting balance (PT) Description: LTG:  Patient will maintain dynamic sitting balance with assistance during mobility activities (PT) Outcome: Completed/Met Flowsheets (Taken 11/15/2024 1444 by Jakie Reyes SQUIBB, PT) LTG: Pt will maintain dynamic sitting balance during mobility activities with:: Independent Goal: LTG Patient will maintain dynamic standing balance (PT) Description: LTG:  Patient will maintain dynamic standing balance with assistance during mobility activities (PT) Outcome: Completed/Met Flowsheets (Taken 11/25/2024 1828) LTG: Pt will maintain dynamic standing balance during mobility activities with:: Independent   Problem: Sit to Stand Goal: LTG:  Patient will perform sit to stand with assistance level (PT) Description: LTG:  Patient will perform sit to stand with assistance level (PT) Outcome: Completed/Met Flowsheets (Taken 11/25/2024 1828) LTG: PT will perform sit to stand in preparation for functional mobility with assistance level: Independent   Problem: RH Bed Mobility Goal: LTG Patient will perform bed mobility with assist (PT) Description: LTG: Patient will perform bed mobility with assistance, with/without cues (PT). Outcome: Completed/Met Flowsheets (Taken 11/15/2024 1444 by Jakie Reyes SQUIBB, PT) LTG: Pt will perform bed mobility with assistance level of: Independent   Problem: RH Bed to Chair Transfers Goal: LTG Patient will perform bed/chair transfers w/assist (PT) Description: LTG: Patient will perform bed to chair transfers with assistance (PT). Outcome: Completed/Met Flowsheets (Taken 11/25/2024 1828) LTG: Pt will perform Bed to Chair Transfers with assistance level: Independent   Problem: RH Car Transfers Goal: LTG Patient will perform car transfers with assist (PT) Description: LTG: Patient will perform car transfers with assistance (PT). Outcome: Completed/Met Flowsheets (Taken  11/15/2024 1444 by Jakie Reyes SQUIBB, PT) LTG: Pt will perform car transfers with assist:: Independent with assistive device    Problem: RH Furniture Transfers Goal: LTG Patient will perform furniture transfers w/assist (OT/PT) Description: LTG: Patient will perform furniture transfers  with assistance (OT/PT). Outcome: Completed/Met Flowsheets (Taken 11/15/2024 1444 by Jakie Reyes SQUIBB, PT) LTG: Pt will perform furniture transfers with assist:: Independent with assistive device    Problem: RH Floor Transfers Goal: LTG Patient will perform floor transfers w/assist (PT) Description: LTG: Patient will perform floor transfers with assistance (PT). Outcome: Completed/Met Flowsheets (Taken 11/15/2024 1444 by Jakie Reyes SQUIBB, PT) LTG: PT WILL PERFORM FLOOR TRANFERS  WITH  ASSIST:: Supervision/Verbal cueing   Problem: RH Ambulation Goal: LTG Patient will ambulate in controlled environment (PT) Description: LTG: Patient will ambulate in a controlled environment, # of feet with assistance (PT). Outcome: Completed/Met Flowsheets Taken 11/25/2024 1828 by Thaddeus Mliss LABOR, PT LTG: Ambulation distance in controlled environment: 875 ft with no AD Taken 11/15/2024 1444 by Jakie Reyes SQUIBB, PT LTG: Pt will ambulate in controlled environ  assist needed:: Supervision/Verbal cueing Goal: LTG Patient will ambulate in home environment (PT) Description: LTG: Patient will ambulate in home environment, # of feet with assistance (PT). Outcome: Completed/Met Flowsheets (Taken 11/25/2024 1828) LTG: Pt will ambulate in home environ  assist needed:: Independent   Problem: RH Stairs Goal: LTG Patient will ambulate up and down stairs w/assist (PT) Description: LTG: Patient will ambulate up and down # of stairs with assistance (PT) Outcome: Completed/Met Flowsheets (Taken 11/15/2024 1444 by Jakie Reyes SQUIBB, PT) LTG: Pt will ambulate up/down stairs assist needed:: Supervision/Verbal cueing LTG: Pt will   ambulate up and down number of stairs: 12   "

## 2024-11-27 ENCOUNTER — Ambulatory Visit: Attending: Cardiovascular Disease

## 2024-11-27 DIAGNOSIS — I442 Atrioventricular block, complete: Secondary | ICD-10-CM

## 2024-11-27 LAB — CUP PACEART REMOTE DEVICE CHECK
Battery Remaining Longevity: 146 mo
Battery Voltage: 3.16 V
Brady Statistic AP VP Percent: 25.83 %
Brady Statistic AP VS Percent: 0 %
Brady Statistic AS VP Percent: 74.15 %
Brady Statistic AS VS Percent: 0.02 %
Brady Statistic RA Percent Paced: 25.8 %
Brady Statistic RV Percent Paced: 99.98 %
Date Time Interrogation Session: 20260128082528
Implantable Lead Connection Status: 753985
Implantable Lead Connection Status: 753985
Implantable Lead Implant Date: 20250606
Implantable Lead Implant Date: 20250606
Implantable Lead Location: 753859
Implantable Lead Location: 753860
Implantable Lead Model: 3830
Implantable Lead Model: 5076
Implantable Pulse Generator Implant Date: 20250606
Lead Channel Impedance Value: 380 Ohm
Lead Channel Impedance Value: 437 Ohm
Lead Channel Impedance Value: 456 Ohm
Lead Channel Impedance Value: 608 Ohm
Lead Channel Pacing Threshold Amplitude: 0.5 V
Lead Channel Pacing Threshold Amplitude: 0.75 V
Lead Channel Pacing Threshold Pulse Width: 0.4 ms
Lead Channel Pacing Threshold Pulse Width: 0.4 ms
Lead Channel Sensing Intrinsic Amplitude: 18.25 mV
Lead Channel Sensing Intrinsic Amplitude: 18.25 mV
Lead Channel Sensing Intrinsic Amplitude: 3 mV
Lead Channel Sensing Intrinsic Amplitude: 3 mV
Lead Channel Setting Pacing Amplitude: 1.5 V
Lead Channel Setting Pacing Amplitude: 2 V
Lead Channel Setting Pacing Pulse Width: 0.4 ms
Lead Channel Setting Sensing Sensitivity: 1.2 mV
Zone Setting Status: 755011

## 2024-11-28 ENCOUNTER — Ambulatory Visit: Payer: Self-pay | Admitting: Cardiovascular Disease

## 2024-12-05 NOTE — Progress Notes (Signed)
 Remote PPM Transmission

## 2024-12-06 ENCOUNTER — Encounter: Admitting: Registered Nurse

## 2024-12-06 VITALS — BP 126/80 | HR 78 | Ht 63.0 in | Wt 145.0 lb

## 2024-12-06 DIAGNOSIS — I639 Cerebral infarction, unspecified: Secondary | ICD-10-CM

## 2024-12-06 DIAGNOSIS — I10 Essential (primary) hypertension: Secondary | ICD-10-CM

## 2024-12-06 NOTE — Patient Instructions (Addendum)
 Call Dr Cindie office to schedule Hospital Follow up : [phone]

## 2024-12-06 NOTE — Progress Notes (Unsigned)
 "  Subjective:    Patient ID: Kayla Terry, female    DOB: 1945/11/15, 79 y.o.   MRN: [id number]  HPI Pain Inventory Average Pain 8 Pain Right Now 10 My pain is constant and burning  In the last 24 hours, has pain interfered with the following? General activity 7 Relation with others 0 Enjoyment of life 2 What TIME of day is your pain at its worst? night Sleep (in general) Fair  Pain is worse with: walking and standing Pain improves with: medication Relief from Meds: 10  walk without assistance walk with assistance use a cane how many minutes can you walk? 10-20 ability to climb steps?  yes do you drive?  no  retired  bladder control problems bowel control problems weakness numbness tingling spasms dizziness confusion depression  TC call  TC call    Family History  Problem Relation Age of Onset   Cancer Mother    Leukemia Mother    Lung cancer Father    Multiple myeloma Father    Multiple sclerosis Sister    Alcohol  abuse Brother    Multiple myeloma Brother    Colon cancer Neg Hx    Colon polyps Neg Hx    Stomach cancer Neg Hx    Esophageal cancer Neg Hx    Neuropathy Neg Hx    Stroke Neg Hx    Social History   Socioeconomic History   Marital status: Widowed    Spouse name: Not on file   Number of children: Not on file   Years of education: Not on file   Highest education level: Not on file  Occupational History   Not on file  Tobacco Use   Smoking status: Never    Passive exposure: Never   Smokeless tobacco: Never  Vaping Use   Vaping status: Never Used  Substance and Sexual Activity   Alcohol  use: No   Drug use: No   Sexual activity: Not Currently  Other Topics Concern   Not on file  Social History Narrative   Lives alone    Her husband passed Feb 2021   Caffeine: maybe 1 cup/day, mostly water    Social Drivers of Health   Tobacco Use: Low Risk (12/03/2024)   Received from [facility]    Patient History    Smoking Tobacco Use: Never    Smokeless Tobacco Use: Never    Passive Exposure: Past  Financial Resource Strain: Not on file  Food Insecurity: No Food Insecurity (11/09/2024)   Epic    Worried About Programme Researcher, Broadcasting/film/video in the Last Year: Never true    Ran Out of Food in the Last Year: Never true  Transportation Needs: No Transportation Needs (11/09/2024)   Epic    Lack of Transportation (Medical): No    Lack of Transportation (Non-Medical): No  Physical Activity: Not on file  Stress: Not on file  Social Connections: Unknown (11/13/2024)   Social Connection and Isolation Panel    Frequency of Communication with Friends and Family: More than three times a week    Frequency of Social Gatherings with Friends and Family: More than three times a week    Attends Religious Services: Patient declined    Active Member of Clubs or Organizations: Yes    Attends Banker Meetings: 1 to 4 times per year    Marital Status: Patient declined  Depression (PHQ2-9): Low Risk (12/06/2024)   Depression (PHQ2-9)    PHQ-2 Score: 4  Alcohol  Screen:  Not on file  Housing: Low Risk (11/09/2024)   Epic    Unable to Pay for Housing in the Last Year: No    Number of Times Moved in the Last Year: 0    Homeless in the Last Year: No  Utilities: Not At Risk (11/09/2024)   Epic    Threatened with loss of utilities: No  Health Literacy: Not on file   Past Surgical History:  Procedure Laterality Date   ABDOMINAL HYSTERECTOMY  02/1984   BACK SURGERY     CATARACT EXTRACTION, BILATERAL     COLONOSCOPY WITH PROPOFOL  N/A 01/17/2023   Procedure: COLONOSCOPY WITH PROPOFOL ;  Surgeon: Leigh Elspeth SQUIBB, MD;  Location: [facility];  Service: Gastroenterology;  Laterality: N/A;   LEFT HEART CATH AND CORONARY ANGIOGRAPHY N/A 04/22/2021   Procedure: LEFT HEART CATH AND CORONARY ANGIOGRAPHY;  Surgeon: Verlin Lonni BIRCH, MD;  Location: [facility];  Service:  Cardiovascular;  Laterality: N/A;   LUMBAR LAMINECTOMY     OOPHORECTOMY  02/29/1984   unilateral   PACEMAKER IMPLANT N/A 04/05/2024   Procedure: PACEMAKER IMPLANT;  Surgeon: Cindie Ole DASEN, MD;  Location: [facility];  Service: Cardiovascular;  Laterality: N/A;   POLYPECTOMY  01/17/2023   Procedure: POLYPECTOMY;  Surgeon: Leigh Elspeth SQUIBB, MD;  Location: [facility];  Service: Gastroenterology;;   TEMPORARY PACEMAKER N/A 04/04/2024   Procedure: TEMPORARY PACEMAKER;  Surgeon: Elmira Newman PARAS, MD;  Location: [facility];  Service: Cardiovascular;  Laterality: N/A;   Past Medical History:  Diagnosis Date   Allergy    B12 deficiency 04/2020   Cataract    Coronary artery disease    COVID-19 04/22/2021   Diverticulitis    10/19/2022   Fatty liver    GERD (gastroesophageal reflux disease)    Herpes zoster without complications    Hiatal hernia    Hypertension    Mild mitral regurgitation 2013   Mobitz type 2 second degree atrioventricular block    Pectus excavatum    pectus excavatum deformity resulting in mass effect on the RV by CT 03/2020   Pulmonary nodule    Shingles    Stroke (HCC)    10/19/2022   BP 126/80   Pulse 78   Ht 5' 3 (1.6 m)   Wt 145 lb (65.8 kg)   SpO2 95%   BMI 25.69 kg/m   Opioid Risk Score:   Fall Risk Score:  `1  Depression screen [facility] 2/9     12/06/2024    1:17 PM 08/18/2017    8:13 AM  Depression screen PHQ 2/9  Decreased Interest 0 0  Down, Depressed, Hopeless 0 0  PHQ - 2 Score 0 0  Altered sleeping 0   Tired, decreased energy 1   Change in appetite 0   Feeling bad or failure about yourself  1   Trouble concentrating 1   Moving slowly or fidgety/restless 1   Suicidal thoughts 0   PHQ-9 Score 4   Difficult doing work/chores Not difficult at all       Review of Systems  Constitutional:  Positive for appetite change and diaphoresis.  Gastrointestinal:  Positive for abdominal pain.   Musculoskeletal:  Positive for gait problem.       B/L leg pain spasms  Neurological:  Positive for weakness and numbness.  Psychiatric/Behavioral:  Positive for confusion and dysphoric mood.   All other systems reviewed and are negative.      Objective:   Physical Exam  Assessment & Plan:    "

## 2024-12-25 ENCOUNTER — Inpatient Hospital Stay: Admitting: Adult Health

## 2025-01-03 ENCOUNTER — Encounter: Admitting: Physical Medicine & Rehabilitation

## 2025-02-19 ENCOUNTER — Encounter

## 2025-02-26 ENCOUNTER — Ambulatory Visit

## 2025-05-21 ENCOUNTER — Encounter

## 2025-05-28 ENCOUNTER — Ambulatory Visit

## 2025-08-20 ENCOUNTER — Encounter

## 2025-08-27 ENCOUNTER — Ambulatory Visit

## 2025-11-19 ENCOUNTER — Encounter

## 2025-11-26 ENCOUNTER — Ambulatory Visit

## 2026-02-18 ENCOUNTER — Encounter
